# Patient Record
Sex: Female | Born: 1950
Health system: Southern US, Community
[De-identification: ages and names within clinical notes are randomized; demographics above are authoritative.]

## PROBLEM LIST (undated history)

## (undated) DIAGNOSIS — I10 Essential (primary) hypertension: Secondary | ICD-10-CM

## (undated) DIAGNOSIS — F329 Major depressive disorder, single episode, unspecified: Secondary | ICD-10-CM

## (undated) DIAGNOSIS — J3489 Other specified disorders of nose and nasal sinuses: Secondary | ICD-10-CM

## (undated) DIAGNOSIS — R233 Spontaneous ecchymoses: Secondary | ICD-10-CM

## (undated) DIAGNOSIS — Z87442 Personal history of urinary calculi: Secondary | ICD-10-CM

## (undated) DIAGNOSIS — Z460 Encounter for fitting and adjustment of spectacles and contact lenses: Secondary | ICD-10-CM

## (undated) DIAGNOSIS — F32A Depression, unspecified: Secondary | ICD-10-CM

## (undated) DIAGNOSIS — C50912 Malignant neoplasm of unspecified site of left female breast: Secondary | ICD-10-CM

## (undated) DIAGNOSIS — J45909 Unspecified asthma, uncomplicated: Secondary | ICD-10-CM

## (undated) DIAGNOSIS — E7800 Pure hypercholesterolemia, unspecified: Secondary | ICD-10-CM

## (undated) DIAGNOSIS — R011 Cardiac murmur, unspecified: Secondary | ICD-10-CM

## (undated) DIAGNOSIS — K449 Diaphragmatic hernia without obstruction or gangrene: Secondary | ICD-10-CM

## (undated) DIAGNOSIS — K625 Hemorrhage of anus and rectum: Secondary | ICD-10-CM

## (undated) DIAGNOSIS — F419 Anxiety disorder, unspecified: Secondary | ICD-10-CM

## (undated) DIAGNOSIS — M1990 Unspecified osteoarthritis, unspecified site: Secondary | ICD-10-CM

## (undated) DIAGNOSIS — I2510 Atherosclerotic heart disease of native coronary artery without angina pectoris: Secondary | ICD-10-CM

## (undated) DIAGNOSIS — R238 Other skin changes: Secondary | ICD-10-CM

## (undated) DIAGNOSIS — H9313 Tinnitus, bilateral: Secondary | ICD-10-CM

## (undated) DIAGNOSIS — M810 Age-related osteoporosis without current pathological fracture: Secondary | ICD-10-CM

## (undated) DIAGNOSIS — T7840XA Allergy, unspecified, initial encounter: Secondary | ICD-10-CM

## (undated) DIAGNOSIS — K219 Gastro-esophageal reflux disease without esophagitis: Secondary | ICD-10-CM

## (undated) HISTORY — PX: BREAST SURGERY: SHX581

## (undated) HISTORY — DX: Depression, unspecified: F32.A

## (undated) HISTORY — DX: Major depressive disorder, single episode, unspecified: F32.9

## (undated) HISTORY — PX: OVARIAN CYST SURGERY: SHX726

## (undated) HISTORY — DX: Spontaneous ecchymoses: R23.3

## (undated) HISTORY — DX: Anxiety disorder, unspecified: F41.9

## (undated) HISTORY — DX: Cardiac murmur, unspecified: R01.1

## (undated) HISTORY — DX: Allergy, unspecified, initial encounter: T78.40XA

## (undated) HISTORY — DX: Age-related osteoporosis without current pathological fracture: M81.0

## (undated) HISTORY — DX: Hemorrhage of anus and rectum: K62.5

## (undated) HISTORY — DX: Encounter for fitting and adjustment of spectacles and contact lenses: Z46.0

## (undated) HISTORY — PX: APPENDECTOMY: SHX54

## (undated) HISTORY — DX: Other skin changes: R23.8

## (undated) HISTORY — DX: Unspecified osteoarthritis, unspecified site: M19.90

## (undated) HISTORY — DX: Diaphragmatic hernia without obstruction or gangrene: K44.9

## (undated) HISTORY — DX: Other specified disorders of nose and nasal sinuses: J34.89

---

## 1998-05-27 ENCOUNTER — Other Ambulatory Visit: Admission: RE | Admit: 1998-05-27 | Discharge: 1998-05-27 | Payer: Self-pay | Admitting: *Deleted

## 1999-08-05 ENCOUNTER — Other Ambulatory Visit: Admission: RE | Admit: 1999-08-05 | Discharge: 1999-08-05 | Payer: Self-pay | Admitting: *Deleted

## 1999-09-04 ENCOUNTER — Other Ambulatory Visit: Admission: RE | Admit: 1999-09-04 | Discharge: 1999-09-04 | Payer: Self-pay | Admitting: *Deleted

## 1999-09-04 ENCOUNTER — Encounter (INDEPENDENT_AMBULATORY_CARE_PROVIDER_SITE_OTHER): Payer: Self-pay

## 2000-08-04 ENCOUNTER — Other Ambulatory Visit: Admission: RE | Admit: 2000-08-04 | Discharge: 2000-08-04 | Payer: Self-pay | Admitting: *Deleted

## 2001-08-28 ENCOUNTER — Other Ambulatory Visit: Admission: RE | Admit: 2001-08-28 | Discharge: 2001-08-28 | Payer: Self-pay | Admitting: *Deleted

## 2002-09-17 ENCOUNTER — Other Ambulatory Visit: Admission: RE | Admit: 2002-09-17 | Discharge: 2002-09-17 | Payer: Self-pay | Admitting: *Deleted

## 2003-09-30 ENCOUNTER — Other Ambulatory Visit: Admission: RE | Admit: 2003-09-30 | Discharge: 2003-09-30 | Payer: Self-pay | Admitting: *Deleted

## 2003-10-23 ENCOUNTER — Ambulatory Visit (HOSPITAL_COMMUNITY): Admission: RE | Admit: 2003-10-23 | Discharge: 2003-10-23 | Payer: Self-pay | Admitting: *Deleted

## 2004-10-21 ENCOUNTER — Other Ambulatory Visit: Admission: RE | Admit: 2004-10-21 | Discharge: 2004-10-21 | Payer: Self-pay | Admitting: *Deleted

## 2005-08-16 ENCOUNTER — Emergency Department (HOSPITAL_COMMUNITY): Admission: EM | Admit: 2005-08-16 | Discharge: 2005-08-16 | Payer: Self-pay | Admitting: Emergency Medicine

## 2005-09-28 ENCOUNTER — Other Ambulatory Visit: Admission: RE | Admit: 2005-09-28 | Discharge: 2005-09-28 | Payer: Self-pay | Admitting: *Deleted

## 2006-08-22 ENCOUNTER — Other Ambulatory Visit: Admission: RE | Admit: 2006-08-22 | Discharge: 2006-08-22 | Payer: Self-pay | Admitting: *Deleted

## 2007-08-21 ENCOUNTER — Other Ambulatory Visit: Admission: RE | Admit: 2007-08-21 | Discharge: 2007-08-21 | Payer: Self-pay | Admitting: *Deleted

## 2009-03-24 ENCOUNTER — Encounter: Admission: RE | Admit: 2009-03-24 | Discharge: 2009-03-24 | Payer: Self-pay | Admitting: Gastroenterology

## 2009-03-24 ENCOUNTER — Encounter (INDEPENDENT_AMBULATORY_CARE_PROVIDER_SITE_OTHER): Payer: Self-pay | Admitting: General Surgery

## 2009-03-24 ENCOUNTER — Ambulatory Visit (HOSPITAL_COMMUNITY): Admission: EM | Admit: 2009-03-24 | Discharge: 2009-03-25 | Payer: Self-pay | Admitting: Emergency Medicine

## 2010-09-08 ENCOUNTER — Other Ambulatory Visit: Payer: Self-pay | Admitting: Radiology

## 2010-09-08 DIAGNOSIS — D0590 Unspecified type of carcinoma in situ of unspecified breast: Secondary | ICD-10-CM | POA: Insufficient documentation

## 2010-09-08 DIAGNOSIS — C50919 Malignant neoplasm of unspecified site of unspecified female breast: Secondary | ICD-10-CM | POA: Insufficient documentation

## 2010-09-14 ENCOUNTER — Encounter
Admission: RE | Admit: 2010-09-14 | Discharge: 2010-09-14 | Payer: Self-pay | Source: Home / Self Care | Attending: Radiology | Admitting: Radiology

## 2010-10-01 ENCOUNTER — Encounter (HOSPITAL_BASED_OUTPATIENT_CLINIC_OR_DEPARTMENT_OTHER)
Admission: RE | Admit: 2010-10-01 | Discharge: 2010-10-01 | Disposition: A | Discharge status: Home / Self Care | Payer: PRIVATE HEALTH INSURANCE | Source: Ambulatory Visit | Attending: Surgery | Admitting: Surgery

## 2010-10-01 ENCOUNTER — Ambulatory Visit
Admission: RE | Admit: 2010-10-01 | Discharge: 2010-10-01 | Disposition: A | Discharge status: Home / Self Care | Payer: PRIVATE HEALTH INSURANCE | Source: Ambulatory Visit | Attending: Surgery | Admitting: Surgery

## 2010-10-01 ENCOUNTER — Other Ambulatory Visit: Payer: Self-pay | Admitting: Surgery

## 2010-10-01 DIAGNOSIS — Z01811 Encounter for preprocedural respiratory examination: Secondary | ICD-10-CM

## 2010-10-01 DIAGNOSIS — Z01812 Encounter for preprocedural laboratory examination: Secondary | ICD-10-CM | POA: Insufficient documentation

## 2010-10-01 DIAGNOSIS — Z01810 Encounter for preprocedural cardiovascular examination: Secondary | ICD-10-CM | POA: Insufficient documentation

## 2010-10-01 LAB — COMPREHENSIVE METABOLIC PANEL
AST: 22 U/L (ref 0–37)
BUN: 15 mg/dL (ref 6–23)
Calcium: 9.7 mg/dL (ref 8.4–10.5)
Chloride: 101 mEq/L (ref 96–112)
GFR calc Af Amer: >60 mL/min (ref >60)
GFR calc non Af Amer: 60 mL/min — ABNORMAL LOW (ref >60)
Glucose, Bld: 114 mg/dL — ABNORMAL HIGH (ref 70–99)
Potassium: 4 mEq/L (ref 3.5–5.1)
Total Protein: 7.3 g/dL (ref 6.0–8.3)

## 2010-10-01 LAB — DIFFERENTIAL
Basophils Relative: 0 % (ref 0–1)
Eosinophils Absolute: 0.1 10*3/uL (ref 0.0–0.7)
Eosinophils Relative: 2 % (ref 0–5)
Lymphocytes Relative: 26 % (ref 12–46)
Monocytes Absolute: 0.4 10*3/uL (ref 0.1–1.0)
Neutrophils Relative %: 65 % (ref 43–77)

## 2010-10-01 LAB — CBC
HCT: 40.3 % (ref 36.0–46.0)
Hemoglobin: 13.6 g/dL (ref 12.0–15.0)
MCH: 31.7 pg (ref 26.0–34.0)
MCHC: 33.7 g/dL (ref 30.0–36.0)
Platelets: 230 10*3/uL (ref 150–400)
RBC: 4.29 MIL/uL (ref 3.87–5.11)
RDW: 12.5 % (ref 11.5–15.5)

## 2010-10-01 LAB — URINE MICROSCOPIC-ADD ON

## 2010-10-01 LAB — URINALYSIS, ROUTINE W REFLEX MICROSCOPIC
Bilirubin Urine: NEGATIVE
Hgb urine dipstick: NEGATIVE

## 2010-10-05 ENCOUNTER — Ambulatory Visit (HOSPITAL_BASED_OUTPATIENT_CLINIC_OR_DEPARTMENT_OTHER): Admission: RE | Admit: 2010-10-05 | Payer: PRIVATE HEALTH INSURANCE | Source: Ambulatory Visit | Admitting: Surgery

## 2010-10-05 ENCOUNTER — Other Ambulatory Visit: Payer: Self-pay | Admitting: Surgery

## 2010-10-05 ENCOUNTER — Ambulatory Visit (HOSPITAL_BASED_OUTPATIENT_CLINIC_OR_DEPARTMENT_OTHER)
Admission: RE | Admit: 2010-10-05 | Discharge: 2010-10-05 | Disposition: A | Discharge status: Home / Self Care | Payer: PRIVATE HEALTH INSURANCE | Source: Ambulatory Visit | Attending: Surgery | Admitting: Surgery

## 2010-10-05 DIAGNOSIS — D0590 Unspecified type of carcinoma in situ of unspecified breast: Secondary | ICD-10-CM | POA: Insufficient documentation

## 2010-10-05 HISTORY — PX: BREAST LUMPECTOMY W/ NEEDLE LOCALIZATION: SHX1266

## 2010-10-06 NOTE — Op Note (Signed)
[name], [name]                ACCOUNT NO.:  [id number]  MEDICAL RECORD NO.:  [id number]           PATIENT TYPE:  O  LOCATION:  SDS                          FACILITY:  [facility]  PHYSICIAN:  Currie Paris, M.D.DATE OF BIRTH:  29-Jan-1951  DATE OF PROCEDURE:  10/05/2010 DATE OF DISCHARGE:                              OPERATIVE REPORT   PREOPERATIVE DIAGNOSIS:  Ductal carcinoma in situ, left breast upper outer quadrant, clinical stage 0.  POSTOPERATIVE DIAGNOSIS:  Ductal carcinoma in situ, left breast upper outer quadrant, clinical stage 0.  OPERATION:  Needle-guided excision, left breast cancer.  SURGEON:  Currie Paris, MD  ANESTHESIA:  General.  CLINICAL HISTORY:  This is a 60 year old lady who has recently been found to have some calcifications in the upper outer quadrant left breast and a biopsy had shown intermediate grade DCIS.  There is a second area of calcifications somewhat superior to that, that was biopsied and some bleeding occurred, so some additional biopsies around taken from this area, but the biopsy appeared benign.  After lengthy discussion with the patient and her husband, she elected to proceed to lumpectomy.  Two guidewires were placed, so we could attempt to get the incompletely biopsied area as well.  DESCRIPTION OF PROCEDURE:  I saw the patient in the holding area and reviewed the plans for the procedure as noted above.  She had no further questions.  We both marked the left side as the operative side.  I reviewed the localizing films.  The patient was taken to the operating room and after satisfactory general anesthesia had been obtained, the left breast was prepped and draped and the time-out was done.  The 2 guidewires entered laterally and tracked medially.  The one that tracked towards the DCIS was more inferior and tracked a little bit more towards the chest wall slightly superior and from lateral to medial.  The second wire was  almost in a direct horizontal position couple centimeters above the first.  I made a curvilinear incision between the 2 guidewires.  I mobilized both guidewires into the wound.  I divided some of the fatty and breast tissue lateral to the 2 guidewires down towards the chest wall.  I raised flaps superiorly and inferiorly, so that I had a very thin margin anteriorly.  I did find the clip from the nonmalignant area just basically right under the skin and that was loose, so I took that out and put a suture on the specimen right where the clip had been and marked that with a clip for specimen localizing purposes.  After making a fairly wide flaps on both superior and inferior and somewhat medial, I began to divide the breast tissue from the lateral aspect around the superior aspect towards the medial aspect, then came across the medial aspect and then inferiorly as well.  The more superior of the 2 guidewires then packed with the very bottom of the specimen. This appeared to be where there was some fascia where I had gotten down to the chest wall.  The wire and one of the DCIS appeared to be fairly  well central in the specimen.  All of the margins looked either as fatty tissue or just fibrotic breast tissue.  I spent several minutes making sure everything looked dry.  There had been one arterial bleeder that I had coagulated and had blood fairly vigorously and I think may ever presented the blood vessel that was caused the postoperative hematoma.  I also suture ligated this.  Once I was sure everything was dry, I irrigated.  I then put some Marcaine in to help with postop analgesia.  I put some clips in to mark the margins of the lumpectomy cavity.  We irrigated again and everything appeared to be dry.  The incision was then closed in layers with 3-0 Vicryl, 4-0 Monocryl subcuticular and Dermabond.  Some compression dressing was applied.  The patient tolerated the procedure well.   There were no operative complications.  All counts were correct.     Currie Paris, M.D.     CJS/MEDQ  D:  10/05/2010  T:  10/06/2010  Job:  [id number]  cc:   Ellen Cables, MD [name], M.D.  Electronically Signed by Cyndia Bent M.D. on 10/06/2010 08:36:37 AM

## 2010-10-26 ENCOUNTER — Encounter (HOSPITAL_BASED_OUTPATIENT_CLINIC_OR_DEPARTMENT_OTHER): Payer: PRIVATE HEALTH INSURANCE | Admitting: Oncology

## 2010-10-26 DIAGNOSIS — D0590 Unspecified type of carcinoma in situ of unspecified breast: Secondary | ICD-10-CM

## 2010-10-28 ENCOUNTER — Ambulatory Visit: Payer: PRIVATE HEALTH INSURANCE | Attending: Radiation Oncology | Admitting: Radiation Oncology

## 2010-10-28 DIAGNOSIS — D0590 Unspecified type of carcinoma in situ of unspecified breast: Secondary | ICD-10-CM | POA: Insufficient documentation

## 2010-10-28 DIAGNOSIS — K219 Gastro-esophageal reflux disease without esophagitis: Secondary | ICD-10-CM | POA: Insufficient documentation

## 2010-10-28 DIAGNOSIS — Z79899 Other long term (current) drug therapy: Secondary | ICD-10-CM | POA: Insufficient documentation

## 2010-10-28 DIAGNOSIS — Z801 Family history of malignant neoplasm of trachea, bronchus and lung: Secondary | ICD-10-CM | POA: Insufficient documentation

## 2010-10-28 DIAGNOSIS — Z170 Estrogen receptor positive status [ER+]: Secondary | ICD-10-CM | POA: Insufficient documentation

## 2010-10-28 DIAGNOSIS — F17200 Nicotine dependence, unspecified, uncomplicated: Secondary | ICD-10-CM | POA: Insufficient documentation

## 2010-10-28 DIAGNOSIS — Z803 Family history of malignant neoplasm of breast: Secondary | ICD-10-CM | POA: Insufficient documentation

## 2010-11-21 LAB — BASIC METABOLIC PANEL
Calcium: 8.8 mg/dL (ref 8.4–10.5)
Creatinine, Ser: 0.71 mg/dL (ref 0.4–1.2)
GFR calc Af Amer: >60 mL/min (ref >60)
Glucose, Bld: 109 mg/dL — ABNORMAL HIGH (ref 70–99)
Potassium: 4 mEq/L (ref 3.5–5.1)

## 2010-11-21 LAB — DIFFERENTIAL
Basophils Relative: 0 % (ref 0–1)
Lymphocytes Relative: 4 % — ABNORMAL LOW (ref 12–46)
Monocytes Absolute: 0.9 10*3/uL (ref 0.1–1.0)
Neutro Abs: 13.2 10*3/uL — ABNORMAL HIGH (ref 1.7–7.7)

## 2010-11-21 LAB — CBC
HCT: 40.5 % (ref 36.0–46.0)
Hemoglobin: 13.2 g/dL (ref 12.0–15.0)
MCHC: 32.6 g/dL (ref 30.0–36.0)
MCV: 97.7 fL (ref 78.0–100.0)
RBC: 4.15 MIL/uL (ref 3.87–5.11)

## 2010-11-21 LAB — TYPE AND SCREEN: ABO/RH(D): O POS

## 2010-11-21 LAB — ABO/RH: ABO/RH(D): O POS

## 2010-12-02 ENCOUNTER — Other Ambulatory Visit (HOSPITAL_COMMUNITY): Payer: Self-pay | Admitting: Surgery

## 2010-12-02 DIAGNOSIS — C50912 Malignant neoplasm of unspecified site of left female breast: Secondary | ICD-10-CM

## 2010-12-29 NOTE — Op Note (Signed)
[name], [name]                ACCOUNT NO.:  [id number]   MEDICAL RECORD NO.:  [id number]          PATIENT TYPE:  OBV   LOCATION:  0098                         FACILITY:  [facility]   PHYSICIAN:  Ollen Gross. Vernell Morgans, M.D. DATE OF BIRTH:  1950-10-18   DATE OF PROCEDURE:  03/24/2009  DATE OF DISCHARGE:                               OPERATIVE REPORT   PREOPERATIVE DIAGNOSIS:  Acute appendicitis.   POSTOPERATIVE DIAGNOSIS:  Acute appendicitis.   PROCEDURE:  Laparoscopic appendectomy.   SURGEON:  Ollen Gross. Vernell Morgans, M.D.   ANESTHESIA:  General endotracheal.   PROCEDURE:  After informed consent was obtained, the patient was brought  to the operating room and placed in a supine position on the operating  room table.  After adequate induction of general anesthesia, the  patient's abdomen was prepped with Betadine and draped in the usual  sterile manner.  The area below his umbilicus was infiltrated with 0.25%  Marcaine.  A small incision was made with a 15 blade knife.  This  incision was carried down through the subcutaneous tissue bluntly with a  hemostat and [employer] retractors until the linea alba was identified.  The linea alba was incised with a 15 blade knife, and each side was  grasped with Kocher clamps and elevated anteriorly.  The preperitoneal  space was then probed bluntly with a hemostat until the peritoneum was  opened.  Access was gained to the abdominal cavity.  A 0 Vicryl  pursestring stitch was placed in the fascia surrounding the opening.  The Hasson cannula  was placed through the opening and anchored in place  with previously placed Vicryl pursestring stitch.  The abdomen was then  insufflated with carbon dioxide without difficulty.  The patient was  placed in Trendelenburg position and rotated with the right side up.  A  laparoscope was inserted through the Hasson cannula , and the right  lower quadrant was inspected.  Next, the suprapubic area was infiltrated  with  4% Marcaine.  A small incision was made with a 15 blade knife, and  an 11-mm port was placed bluntly through this incision into the  abdominal cavity under direct vision.  A site was chosen between the 2  for placement of a 5-mm port.  This area was infiltrated 4% Marcaine.  A  small stab incision was made with a 15 blade knife, and a 5 mm port was  placed bluntly through this incision into the abdominal cavity under  direct vision.  The laparoscope was then moved to the suprapubic port  using a Glassman grasper and harmonic scalpel.  The right lower quadrant  again was examined.  An enlarged, inflamed appendix was identified.  It  had some inflammatory adhesions to the right gutter.  This was mostly  taken down by some blunt dissection with the [facility].  The mesoappendix  was identified.  It was taken down sharply with the harmonic scalpel.  Once this was accomplished, the base of the appendix at its junction  with the cecum was identified and cleared of the mesoappendix.  Laparoscopic GIA 45  blue load 6 row stapler was then placed through the  Hasson cannula  across the base of the appendix at its junction with the  cecum, clamped and fired, thereby dividing the base of the appendix  between staple lines.  The rest of the mesoappendix was taken down  sharply, beginning with the harmonic scalpel until the appendix was  completely free.  A laparoscopic bag was inserted through the [facility]  cannula.  The appendix was placed in the bag, and the bag was sealed.  The abdomen was then irrigated with copious amounts of saline until the  effluent was clear.  The staple line still had some oozing blood, and  this was controlled with clips.  Once this was accomplished, the  operative bed looked good.  The appendix was then removed in the bag  with the Hasson cannula through the infraumbilical port without  difficulty.  The fascial defect was closed with the previously placed  Vicryl pursestring  stitch as well as with another figure-of-eight 0  Vicryl stitch.  The rest of the ports were then removed under direct  vision and were found to be hemostatic.  The gas was allowed to escape.  The skin incisions were all closed with interrupted 4-0 Monocryl  subcuticular stitches.  Dermabond dressings were applied.  The patient  tolerated the procedure well.  At the end of the case, all needle,  sponge, and instrument counts were correct.  The patient was then awaked  and taken to recovery in stable condition.      Ollen Gross. Vernell Morgans, M.D.  Electronically Signed     PST/MEDQ  D:  03/24/2009  T:  03/25/2009  Job:  [id number]

## 2010-12-29 NOTE — H&P (Signed)
[name], [name]                ACCOUNT NO.:  [id number]   MEDICAL RECORD NO.:  [id number]          PATIENT TYPE:  EMS   LOCATION:  ED                           FAC[facility]  [facility]   PHYSICIAN:  Ollen Gross. Vernell Morgans, M.D. DATE OF BIRTH:  12/11/1950   DATE OF ADMISSION:  03/24/2009  DATE OF DISCHARGE:                              HISTORY & PHYSICAL   [name] is a 60 year old white female who presents to the emergency  department with worsening right lower quadrant pain over the last 4  days.  She has not run any fevers but had some chills on Thursday.  She  has had some nausea and vomiting associated with it today.  In  retrospect, she feels as though she has had this pain off and on since  March and has seen [name] for this.  Her other review of systems are  unremarkable.   PAST MEDICAL HISTORY:  Significant for:  1. Anxiety.  2. Asthma.   PAST SURGICAL HISTORY:  Significant for three laparoscopies.   MEDICATIONS:  Include calcium Celexa, Zyrtec, Singulair, multivitamin.   ALLERGIES:  ELAVIL AND SULFA.   SOCIAL HISTORY:  She is a Sport and exercise psychologist.  She denies any alcohol or tobacco  use.   FAMILY HISTORY:  Noncontributory.   PHYSICAL EXAM:  Her temperature is 98.7, pulse 104, blood pressure  117/65.  IN GENERAL:  She is a well-developed, well-nourished white female in no  acute distress.  SKIN:  Warm and dry with no jaundice.  EYES:  Extraocular muscles intact. Pupils equal, round, reactive to  light.  Sclerae nonicteric.  LUNGS:  Clear bilaterally with no use of accessory respiratory muscles.  HEART:  Has a regular rate and rhythm with the impulse in the left  chest.  ABDOMEN:  Soft with some moderate right lower quadrant tenderness but no  guarding or peritonitis.  EXTREMITIES:  No cyanosis, clubbing or edema with good strength in her  arms and legs.  PSYCHOLOGICALLY:  She is alert and oriented x3 with no evidence of  anxiety or depression.   LABORATORY]:  On review of her  lab work:  Significant for white count of  14.6.  CT scan showed an enlarged inflamed appendix but no abscess or  perforation.   ASSESSMENT/PLAN:  This is a 60 year old white female with what appears  to be acute appendicitis.  Because of the risk of perforation and  sepsis, I think she would benefit from having her appendix removed.  She  would also like  to have this done.  I have explained to her in detail the risks and  benefits of the operation to remove the appendix as well as some of the  technical aspects and she understands and wishes to proceed.  We will  plan to do this for her tonight.      Ollen Gross. Vernell Morgans, M.D.  Electronically Signed     PST/MEDQ  D:  03/24/2009  T:  03/24/2009  Job:  [id number]

## 2011-01-25 ENCOUNTER — Encounter (HOSPITAL_COMMUNITY)
Admission: RE | Admit: 2011-01-25 | Discharge: 2011-01-25 | Disposition: A | Discharge status: Home / Self Care | Payer: PRIVATE HEALTH INSURANCE | Source: Ambulatory Visit | Attending: Surgery | Admitting: Surgery

## 2011-01-25 LAB — DIFFERENTIAL
Basophils Relative: 0 % (ref 0–1)
Eosinophils Relative: 2 % (ref 0–5)
Monocytes Absolute: 0.4 10*3/uL (ref 0.1–1.0)
Neutro Abs: 3.6 10*3/uL (ref 1.7–7.7)

## 2011-01-25 LAB — URINALYSIS, ROUTINE W REFLEX MICROSCOPIC
Glucose, UA: NEGATIVE mg/dL
Hgb urine dipstick: NEGATIVE
Ketones, ur: NEGATIVE mg/dL
Urobilinogen, UA: 0.2 mg/dL (ref 0.0–1.0)
pH: 6 (ref 5.0–8.0)

## 2011-01-25 LAB — CBC
HCT: 41.3 % (ref 36.0–46.0)
MCH: 30.6 pg (ref 26.0–34.0)
MCHC: 33.2 g/dL (ref 30.0–36.0)
Platelets: 240 10*3/uL (ref 150–400)
RBC: 4.47 MIL/uL (ref 3.87–5.11)
WBC: 6.2 10*3/uL (ref 4.0–10.5)

## 2011-01-25 LAB — COMPREHENSIVE METABOLIC PANEL
AST: 26 U/L (ref 0–37)
BUN: 17 mg/dL (ref 6–23)
CO2: 29 mEq/L (ref 19–32)
Calcium: 9.6 mg/dL (ref 8.4–10.5)
Chloride: 102 mEq/L (ref 96–112)
Glucose, Bld: 93 mg/dL (ref 70–99)

## 2011-01-25 LAB — SURGICAL PCR SCREEN: MRSA, PCR: NEGATIVE

## 2011-01-26 ENCOUNTER — Other Ambulatory Visit: Payer: Self-pay | Admitting: Surgery

## 2011-01-26 ENCOUNTER — Inpatient Hospital Stay (HOSPITAL_COMMUNITY)
Admission: RE | Admit: 2011-01-26 | Discharge: 2011-01-26 | Disposition: A | Discharge status: Home / Self Care | Payer: PRIVATE HEALTH INSURANCE | Source: Ambulatory Visit | Attending: Surgery | Admitting: Surgery

## 2011-01-26 ENCOUNTER — Inpatient Hospital Stay (HOSPITAL_COMMUNITY)
Admission: RE | Admit: 2011-01-26 | Discharge: 2011-01-28 | DRG: 581 | Disposition: A | Discharge status: Home / Self Care | Payer: PRIVATE HEALTH INSURANCE | Source: Ambulatory Visit | Attending: Surgery | Admitting: Surgery

## 2011-01-26 DIAGNOSIS — C50912 Malignant neoplasm of unspecified site of left female breast: Secondary | ICD-10-CM

## 2011-01-26 DIAGNOSIS — C50419 Malignant neoplasm of upper-outer quadrant of unspecified female breast: Secondary | ICD-10-CM | POA: Diagnosis present

## 2011-01-26 DIAGNOSIS — D0590 Unspecified type of carcinoma in situ of unspecified breast: Principal | ICD-10-CM | POA: Diagnosis present

## 2011-01-26 DIAGNOSIS — Z01812 Encounter for preprocedural laboratory examination: Secondary | ICD-10-CM

## 2011-01-26 DIAGNOSIS — Z4001 Encounter for prophylactic removal of breast: Secondary | ICD-10-CM

## 2011-01-26 DIAGNOSIS — K219 Gastro-esophageal reflux disease without esophagitis: Secondary | ICD-10-CM | POA: Diagnosis present

## 2011-01-26 DIAGNOSIS — J45909 Unspecified asthma, uncomplicated: Secondary | ICD-10-CM | POA: Diagnosis present

## 2011-01-26 HISTORY — PX: MASTECTOMY COMPLETE / SIMPLE W/ SENTINEL NODE BIOPSY: SUR846

## 2011-01-26 HISTORY — PX: MASTECTOMY: SHX3

## 2011-01-26 MED ORDER — TECHNETIUM TC 99M SULFUR COLLOID FILTERED
1.0000 | Freq: Once | INTRAVENOUS | Status: AC | PRN
  Administered 2011-01-26: 1 via INTRADERMAL

## 2011-01-27 NOTE — Op Note (Signed)
[name], [name]                ACCOUNT NO.:  [id number]  MEDICAL RECORD NO.:  [id number]  LOCATION:  5121                         FACILITY:  [facility]  PHYSICIAN:  Currie Paris, M.D.DATE OF BIRTH:  1951-08-13  DATE OF PROCEDURE:  01/26/2011 DATE OF DISCHARGE:                              OPERATIVE REPORT   PREOPERATIVE DIAGNOSIS:  Ductal carcinoma in situ left breast upper outer quadrant, clinical stage 0, status post lumpectomy with close margin.  POSTOPERATIVE DIAGNOSIS:  Ductal carcinoma in situ left breast upper outer quadrant, clinical stage 0, status post lumpectomy with close margin.  PROCEDURES:  Right prophylactic total mastectomy, left total mastectomy with blue dye injection and axillary sentinel lymph node biopsy.  SURGEON:  Currie Paris, MD  ANESTHESIA:  General.  CLINICAL HISTORY:  This is a 60 year old lady who had a prior left breast biopsy, upper outer quadrant that had shown what apparently wasbenign disease but in the same area she had what looked like multifocal disease developed subsequently and vision had shown DCIS with close margins.  After lengthy discussion with the patient, she finally elected to have a mastectomy with sentinel node evaluation for her cancer on the left side and elected to have a prophylactic mastectomy on the right and the plans for reconstruction.  DESCRIPTION OF PROCEDURE:  I saw the patient in the holding area and she had no further questions.  We confirmed the plans as noted above.  I initialed the left axillary area as the side for the sentinel lymph node.  The patient was taken to the operating room and after satisfactory general endotracheal anesthesia had been obtained, the breasts were prepped and draped as a sterile field.  The time-out was done just prior to prep and draping and once that was completed, I injected 5 mL of dilute methylene blue subareolarly on the left, massaged that in.  I started on  the right side and made a relatively short elliptical incision, fairly narrow and then raised the usual skin flaps to clavicle, sternum, inframammary fold and latissimus.  The breast was removed from the underlying chest wall with cautery starting medially and working laterally.  I did not enter the axilla proper and there was no palpable adenopathy in the axilla.  Once I was sure everything was dry, I placed a moist pack with some antibiotic solution and turned my attention to the left side.  Using Neoprobe, I identified a hot area in the axilla and marked the overlying skin.  I made a somewhat longer wider incision to well encompass the prior curvilinear incision in the upper outer quadrant.  I raised similar skin flaps but after I raised a superior flap and gotten out laterally, I used a Neoprobe and opened the axillary fat pad and found 1 hot lymph node and 1 hot blue lymph node both adjacent somewhat posteriorly and inferiorly in the axilla.  These were sent for touch preps which subsequently returned negative.  While waiting for the touch preps to return, I finished the inferior flap and raised the flap laterally.  I removed the breast again from medial to lateral, opened the clavipectoral fascia and disconnected the breast from the  lateral chest wall.  Again, we did not do any further dissection in the axilla.  I made sure everything was dry and then put a moist pack and at this point Dr. Odis Luster came to do the reconstruction.     Currie Paris, M.D.     CJS/MEDQ  D:  01/26/2011  T:  01/27/2011  Job:  [id number]  Electronically Signed by Cyndia Bent M.D. on 01/27/2011 12:49:30 PM

## 2011-02-04 ENCOUNTER — Encounter (INDEPENDENT_AMBULATORY_CARE_PROVIDER_SITE_OTHER): Payer: Self-pay | Admitting: Surgery

## 2011-02-09 ENCOUNTER — Encounter (INDEPENDENT_AMBULATORY_CARE_PROVIDER_SITE_OTHER): Payer: Self-pay | Admitting: Surgery

## 2011-02-09 ENCOUNTER — Ambulatory Visit (INDEPENDENT_AMBULATORY_CARE_PROVIDER_SITE_OTHER): Payer: PRIVATE HEALTH INSURANCE | Admitting: Surgery

## 2011-02-09 VITALS — BP 126/72 | HR 78 | Temp 98.4°F | Resp 18 | Ht 63.0 in | Wt 135.0 lb

## 2011-02-09 DIAGNOSIS — D0590 Unspecified type of carcinoma in situ of unspecified breast: Secondary | ICD-10-CM

## 2011-02-09 NOTE — Progress Notes (Signed)
The patient returns for her postoperative visit after bilateral mastectomies which were done 2 weeks ago. Overall, the patient feels like she is doing well. Her drain to come out, removed by the plastic surgeon earlier. She is having minimal pain. She's taken only occasional pain medication. She has stopped taking her muscle relaxant.  She has no concerns about her wound and feels like they are healing nicely.  Exam: Both incisions are healing nicely. There is no evidence of infection or residual fluid. The implants appear to be appropriately placed.  Impression: Doing well following mastectomies.  Plan: She is doing close follow up with the plastic surgeon. I told her that I would see her in about 2 months. I gave her a copy of her pathology report and reviewed that with her.

## 2011-02-09 NOTE — Patient Instructions (Signed)
Come back for another follow-up in about two months. When Dr. Odis Luster gives the OK begin exercise program -  Visit the ABC class

## 2011-02-22 NOTE — Op Note (Signed)
[name], [name]                ACCOUNT NO.:  [id number]  MEDICAL RECORD NO.:  [id number]  LOCATION:  5121                         FACILITY:  [facility]  PHYSICIAN:  Etter Sjogren, M.D.     DATE OF BIRTH:  [birth year]  DATE OF PROCEDURE:  01/26/2011 DATE OF DISCHARGE:                              OPERATIVE REPORT   PREOPERATIVE DIAGNOSIS:  Breast cancer.  POSTOPERATIVE DIAGNOSIS:  Breast cancer.  PROCEDURES PERFORMED: 1. Right breast reconstruction with a tissue expander. 2. Left breast reconstruction with a tissue expander. 3. Distinct procedure, placement of acellular dermal matrix left side     for breast reconstruction.  SURGEON:  Etter Sjogren, MD  ANESTHESIA:  General.  ESTIMATED BLOOD LOSS:  20 mL.  DRAINS:  Two 19-French drains on each side.  CLINICAL NOTE:  A 60 year old woman has breast cancer and is having mastectomy because of extensive area of involvement and desires reconstruction.  Bilateral mastectomy planned and the reconstruction selected by her, was placed on a tissue expander.  She understood this is a planned stage procedure with eventual placement of an implant. [name] of this procedure, the risks plus complications, the overall convalescence discussed with her in great detail.  She understood those risks include not limited to bleeding, infection, anesthesia complications, healing problems, scarring, loss of sensation, fluid accumulations, failure of device, capsular contracture, wrinkles, ripples, displacement of device, disappointment, asymmetry, pulmonary embolism, pneumothorax and revision of surgeries and loss of skin, loss of tissue and she understood all of this and wished to proceed.  PROCEDURE IN DETAIL:  The patient was seen in the operating room and bilateral mastectomy completed.  The wounds were irrigated thoroughly with saline as well as antibiotic solution.  Dissection was carried deep to the pectoralis major muscle which was lifted and  the serratus anterior muscle also lifted and these were lifted to the preoperative markings along the inframammary crease.  Thorough irrigation with saline, antibiotic solution.  Excellent hemostasis was achieved using electrocautery.  On the right side, there was a total muscle coverage. On the left side, there was a small spot inferomedial that required the placement of a 3 x 4 cm piece of HD FLEX.  This was positioned with the dermal side up towards the mastectomy flap, epidermal side down and it was secured using a running 3-0 PDS suture.  The expanders were prepared.  These were Mentor medium height 450 mL expanders.  After thoroughly cleaning gloves, the antibiotic solution was placed over the expanders.  All the air were removed, 100 mL sterile saline placed using a closed filling system and again the space was checked.  Antibiotic solution placed and the expanders were positioned.  Muscle closure with 3-0 Vicryl interrupted figure-of-eight sutures and 19-French drains were positioned, brought through separate stab wounds inferolaterally and secured with 3-0 Prolene sutures. One was placed laterally side and one placed medial and superior.  Again excellent hemostasis confirmed.  Skin flaps looked very healthy.  No evidence of any vascular compromise. Bright red bleeding on periphery of the flaps consistent with viability. The skin closure with 3-0 Monocryl interrupted inverted deep dermal sutures.  The Dermabond glue was then placed and antibiotic  solution was left in the space around the tissue expander and also underneath the mastectomy flaps prior to completion.  Antibiotic ointment and dry sterile dressings with occlusive dressings placed around the drain sites.  Dry sterile dressings placed over the mastectomy incisions and the chest vest was positioned.  She was transferred to the recovery in stable having tolerated the procedure well.  DISPOSITION:  She will be observed  overnight in the hospital.     Etter Sjogren, M.D.     DB/MEDQ  D:  01/26/2011  T:  01/27/2011  Job:  [id number]  Electronically Signed by Etter Sjogren M.D. on 02/22/2011 08:38:47 AM

## 2011-02-22 NOTE — Discharge Summary (Signed)
  NAMEKIMBERLEIGH, [name]                ACCOUNT NO.:  [id number]  MEDICAL RECORD NO.:  [id number]  LOCATION:  5121                         FACILITY:  [facility]  PHYSICIAN:  Etter Sjogren, M.D.     DATE OF BIRTH:  02-13-1951  DATE OF ADMISSION:  01/26/2011 DATE OF DISCHARGE:  01/28/2011                              DISCHARGE SUMMARY   FINAL DIAGNOSIS:  Breast cancer.  PROCEDURES PERFORMED: 1. Bilateral mastectomy. 2. Left sentinel lymph node mapping 3. Bilateral breast reconstruction with tissue expanders. 4. Left breast reconstruction utilizing Acellular dermal matrix.  SUMMARY OF HISTORY OF PRESENT ILLNESS:  A 60 year old woman who has extensive areas of ductal carcinoma in situ on the left side and had some large area and lumpectomy was discussed that she ultimately elected to have mastectomy and she decided to have bilateral mastectomy.  She desired reconstruction.  Options discussed.  She preferred to use tissue expanders and she understood this was a stage procedure would eventually be followed by placement of implants and the nature of these procedures and risks plus complications were discussed with her in great detail and she understood all those and wished to proceed.  She also understood about the possibility that Acellular dermal matrix would be needed in her reconstruction.  For further details of history and physical, please see the chart.  COURSE IN THE HOSPITAL:  On admission, she was taken to surgery at which time bilateral mastectomy, left sentinel lymph node mapping, bilateral reconstruction with tissue expanders and left side placement of Acellular dermal matrix were performed.  She tolerated these procedures well.  Postoperatively, she did very well.  She had a very low-grade fever in the 99-100 degrees range that was responding to incentive spirometry and ambulation.  Chest soft.  No hematoma and no evidence of any fluid accumulations, drains, thin drainage, and  functioning.  The mastectomy flaps looked very good, but the 48-hour mark did develop a small area in the mid superior flap on the left side where she had a little bit of discoloration over an area of 3 mm x 1 cm.  This will be observed.  She is tolerating the pain well and having good pain relief with Dilaudid and Robaxin.  It is felt that she is ready to be discharged.  DISPOSITION:  She will empty her drains 3 times with every record of the amounts using spirometer 10 times a day at home.  MEDICATIONS: 1. Keflex 500 mg p.o. q.i.d. for 3 days and then one p.o. b.i.d. 2. Dilaudid 2 mg 1-2 p.o. q. 4 h. p.r.n. for pain. 3. Robaxin 500 mg 1 p.o. q.8 p.r.n. muscle spasm.  She will follow up with me in the office next week.  FINAL DIAGNOSIS:  Breast cancer.  Her condition at the time of discharge is good.     Etter Sjogren, M.D.     DB/MEDQ  D:  01/28/2011  T:  01/29/2011  Job:  [id number]  Electronically Signed by Etter Sjogren M.D. on 02/22/2011 08:38:40 AM

## 2011-04-12 ENCOUNTER — Encounter (INDEPENDENT_AMBULATORY_CARE_PROVIDER_SITE_OTHER): Payer: Self-pay | Admitting: Surgery

## 2011-04-13 ENCOUNTER — Encounter (INDEPENDENT_AMBULATORY_CARE_PROVIDER_SITE_OTHER): Payer: Self-pay | Admitting: Surgery

## 2011-04-13 ENCOUNTER — Ambulatory Visit (INDEPENDENT_AMBULATORY_CARE_PROVIDER_SITE_OTHER): Payer: PRIVATE HEALTH INSURANCE | Admitting: Surgery

## 2011-04-13 VITALS — BP 124/78 | HR 76

## 2011-04-13 DIAGNOSIS — D0590 Unspecified type of carcinoma in situ of unspecified breast: Secondary | ICD-10-CM

## 2011-04-13 NOTE — Progress Notes (Signed)
CC: Postop visit  HPI: This patient comes in for post op follow-up. She underwent bilateral mastectomy and left sentinel node on T. 12, 2011. She feels that she is doing well. Most of her current issues center around feeling tight at the site of her implants. She also notes a little bit of limited range of motion of the left shoulder remains. Chest appointment tomorrow with her plastic surgeon.  PE: General: The patient appears to be healthy, NAD  Breasts are surgically absent bilaterally. There is no evidence of wound infection. She is a very good range of motion on the right shoulder. It is a little bit limited on the left side. Her incisions are little bit more transverse and I had anticipated I suspect some excess skin had to be removed after the original incision was made.  IMPRESSION: The patient is doing well S/P bilateral mastectomy for left-sided DCIS.  DATA REVIWED: No new data  PLAN: I will see back in six months. However some self exam issues with her associated watch for problems that might develop. She will have some more work by the Engineer, petroleum over the next few months and hopefully when I see her back in six months that will all be done and we will have a good post reconstruction exam done.

## 2011-04-13 NOTE — Patient Instructions (Signed)
Come to see me in about six months. Check for any nodules or small lumps that might develop in the area of the mastectomies. If you feel anything unusual come see me.

## 2011-06-09 ENCOUNTER — Ambulatory Visit: Payer: PRIVATE HEALTH INSURANCE | Attending: Plastic Surgery | Admitting: Physical Therapy

## 2011-06-09 DIAGNOSIS — M25619 Stiffness of unspecified shoulder, not elsewhere classified: Secondary | ICD-10-CM | POA: Insufficient documentation

## 2011-06-09 DIAGNOSIS — IMO0001 Reserved for inherently not codable concepts without codable children: Secondary | ICD-10-CM | POA: Insufficient documentation

## 2011-06-09 DIAGNOSIS — M25519 Pain in unspecified shoulder: Secondary | ICD-10-CM | POA: Insufficient documentation

## 2011-06-09 DIAGNOSIS — M24519 Contracture, unspecified shoulder: Secondary | ICD-10-CM | POA: Insufficient documentation

## 2011-06-09 DIAGNOSIS — R0789 Other chest pain: Secondary | ICD-10-CM | POA: Insufficient documentation

## 2011-06-09 DIAGNOSIS — R609 Edema, unspecified: Secondary | ICD-10-CM | POA: Insufficient documentation

## 2011-06-14 ENCOUNTER — Ambulatory Visit: Payer: PRIVATE HEALTH INSURANCE | Admitting: Physical Therapy

## 2011-06-21 ENCOUNTER — Ambulatory Visit: Payer: PRIVATE HEALTH INSURANCE | Attending: Plastic Surgery | Admitting: Physical Therapy

## 2011-06-21 DIAGNOSIS — M24519 Contracture, unspecified shoulder: Secondary | ICD-10-CM | POA: Insufficient documentation

## 2011-06-21 DIAGNOSIS — M25519 Pain in unspecified shoulder: Secondary | ICD-10-CM | POA: Insufficient documentation

## 2011-06-21 DIAGNOSIS — IMO0001 Reserved for inherently not codable concepts without codable children: Secondary | ICD-10-CM | POA: Insufficient documentation

## 2011-06-21 DIAGNOSIS — M25619 Stiffness of unspecified shoulder, not elsewhere classified: Secondary | ICD-10-CM | POA: Insufficient documentation

## 2011-06-21 DIAGNOSIS — R609 Edema, unspecified: Secondary | ICD-10-CM | POA: Insufficient documentation

## 2011-06-21 DIAGNOSIS — R0789 Other chest pain: Secondary | ICD-10-CM | POA: Insufficient documentation

## 2011-06-24 ENCOUNTER — Ambulatory Visit: Payer: PRIVATE HEALTH INSURANCE | Admitting: Physical Therapy

## 2011-06-28 ENCOUNTER — Ambulatory Visit: Payer: PRIVATE HEALTH INSURANCE | Admitting: Physical Therapy

## 2011-07-05 ENCOUNTER — Encounter: Payer: PRIVATE HEALTH INSURANCE | Admitting: Physical Therapy

## 2011-07-07 ENCOUNTER — Encounter: Payer: PRIVATE HEALTH INSURANCE | Admitting: Physical Therapy

## 2011-09-09 ENCOUNTER — Encounter (INDEPENDENT_AMBULATORY_CARE_PROVIDER_SITE_OTHER): Payer: Self-pay | Admitting: Surgery

## 2011-11-15 ENCOUNTER — Other Ambulatory Visit: Payer: Self-pay | Admitting: Physician Assistant

## 2012-01-27 ENCOUNTER — Ambulatory Visit: Payer: PRIVATE HEALTH INSURANCE | Attending: Plastic Surgery | Admitting: Physical Therapy

## 2012-01-27 DIAGNOSIS — M24519 Contracture, unspecified shoulder: Secondary | ICD-10-CM | POA: Insufficient documentation

## 2012-01-27 DIAGNOSIS — IMO0001 Reserved for inherently not codable concepts without codable children: Secondary | ICD-10-CM | POA: Insufficient documentation

## 2012-02-02 ENCOUNTER — Ambulatory Visit: Payer: PRIVATE HEALTH INSURANCE | Admitting: Physical Therapy

## 2012-02-03 ENCOUNTER — Encounter: Payer: PRIVATE HEALTH INSURANCE | Admitting: Physical Therapy

## 2012-02-09 ENCOUNTER — Ambulatory Visit: Payer: PRIVATE HEALTH INSURANCE

## 2012-02-10 ENCOUNTER — Ambulatory Visit: Payer: PRIVATE HEALTH INSURANCE | Admitting: Physical Therapy

## 2012-02-14 ENCOUNTER — Ambulatory Visit: Payer: PRIVATE HEALTH INSURANCE | Attending: Family Medicine

## 2012-02-14 DIAGNOSIS — M25619 Stiffness of unspecified shoulder, not elsewhere classified: Secondary | ICD-10-CM | POA: Insufficient documentation

## 2012-02-14 DIAGNOSIS — M24519 Contracture, unspecified shoulder: Secondary | ICD-10-CM | POA: Insufficient documentation

## 2012-02-14 DIAGNOSIS — IMO0001 Reserved for inherently not codable concepts without codable children: Secondary | ICD-10-CM | POA: Insufficient documentation

## 2012-02-16 ENCOUNTER — Ambulatory Visit: Payer: PRIVATE HEALTH INSURANCE

## 2012-02-21 ENCOUNTER — Ambulatory Visit (INDEPENDENT_AMBULATORY_CARE_PROVIDER_SITE_OTHER): Payer: PRIVATE HEALTH INSURANCE | Admitting: Family Medicine

## 2012-02-21 ENCOUNTER — Ambulatory Visit: Payer: PRIVATE HEALTH INSURANCE | Admitting: Physical Therapy

## 2012-02-21 VITALS — BP 140/74 | HR 85 | Temp 98.5°F | Resp 18 | Ht 62.5 in | Wt 144.6 lb

## 2012-02-21 DIAGNOSIS — F341 Dysthymic disorder: Secondary | ICD-10-CM

## 2012-02-21 DIAGNOSIS — G4700 Insomnia, unspecified: Secondary | ICD-10-CM

## 2012-02-21 DIAGNOSIS — G4710 Hypersomnia, unspecified: Secondary | ICD-10-CM

## 2012-02-21 DIAGNOSIS — F329 Major depressive disorder, single episode, unspecified: Secondary | ICD-10-CM

## 2012-02-21 MED ORDER — DULOXETINE HCL 30 MG PO CPEP: ORAL_CAPSULE | ORAL | Status: DC

## 2012-02-21 MED ORDER — DIAZEPAM 2 MG PO TABS: 2.0000 mg | ORAL_TABLET | Freq: Every evening | ORAL | Status: DC | PRN

## 2012-02-21 NOTE — Patient Instructions (Addendum)
Here are a few names for counseling:    Cephus Slater [phone]  Karmen Bongo [phone]  Laurell Roof [phone]  Start cymbalta 30mg  - 1 per day for 1 week, then 2 each day.  Can switch to 1 per day of samples when done with 30mg  pills, but follow up in next 4 weeks.  Valium - can take 1/2 up to 2 at bedtime as needed for sleep.    Return to the clinic or go to the nearest emergency room if any of your symptoms worsen or new symptoms occur.

## 2012-02-21 NOTE — Progress Notes (Signed)
  Subjective:    Patient ID: [name], female    DOB: 21-Aug-1950, 61 y.o.   MRN: [id number]  HPI [name] is a 61 y.o. female Hx of anxiety,has been on Celexa for awhile.  Prior on 40mg  - was started on 20mg  in January, but felt like stress is higher.  Medical bills and debt right now. recently dealing with more stress., sister has had good relief with cymbalta - wondering if should change to that. Wasn't noticing as much relief of celexa at 40mg  - has been stress past few years. Separated from husband in September.  He came back in December for her reconstruction, but then left again in February.  Stress with trying to get back together, then not.  Has talked to minister, but no recent counseling. Prior counseling last year. Trouble staying asleep.  Waking up with tossing and turning.  Not rested.  Absent minded. Has tried xanax, lorazepam - never getting peaceful rest.  Sometimes residual daytime symptoms, but usually didn't work well enough.    Review of Systems Per hpi.  No SI.     Objective:   Physical Exam  Constitutional: She is oriented to person, place, and time. She appears well-developed and well-nourished.  HENT:  Head: Normocephalic and atraumatic.  Neck: No thyromegaly present.  Pulmonary/Chest: Effort normal.  Neurological: She is alert and oriented to person, place, and time.  Skin: Skin is warm and dry.  Psychiatric: Her speech is normal and behavior is normal. Judgment and thought content normal. Cognition and memory are normal. She expresses no suicidal ideation.       Flat affect, but good eye contact.  No PMA/PMR.           Assessment & Plan:  [name] is a 61 y.o. female Depression/anxiety with marital life stressors. Would like to try different SSRI - will change to Cymbalta - 30mg  qd x 1 week, then 2 po qd. #60, no refill, and samples #14 of 60mg  if tolerating this dose. Recheck in next 2-4 weeks.   Insomnia - trial of Valium 2mg  - 1/2 to 2  qhs prn  - to find right dose.  Recheck in next 2-4 weeks.

## 2012-02-23 ENCOUNTER — Ambulatory Visit: Payer: PRIVATE HEALTH INSURANCE

## 2012-02-24 ENCOUNTER — Ambulatory Visit: Payer: PRIVATE HEALTH INSURANCE | Admitting: Physical Therapy

## 2012-02-25 ENCOUNTER — Encounter: Payer: PRIVATE HEALTH INSURANCE | Admitting: Physical Therapy

## 2012-03-21 ENCOUNTER — Other Ambulatory Visit: Payer: Self-pay | Admitting: Family Medicine

## 2012-03-21 NOTE — Telephone Encounter (Signed)
Pt wanted to relay to dr Neva Seat that she is doing well on the cymbalta. She wants to know if she can get a refill as there were not any on the original script. Pt can be reached on cell At [phone] or office number during the day @ [phone] ext. [phone]

## 2012-07-14 ENCOUNTER — Other Ambulatory Visit: Payer: Self-pay | Admitting: Physician Assistant

## 2012-07-28 ENCOUNTER — Other Ambulatory Visit: Payer: Self-pay | Admitting: Physician Assistant

## 2012-07-28 NOTE — Telephone Encounter (Signed)
Please pull paper chart.  

## 2012-07-31 ENCOUNTER — Telehealth: Payer: Self-pay

## 2012-07-31 NOTE — Telephone Encounter (Signed)
Chart pulled to PA pool [id number]

## 2012-07-31 NOTE — Telephone Encounter (Signed)
PATIENT CALLED SAYING THAT PHARMACY HAS NOT RECEIVED HER REFILL FOR HER ANTI DEPRESSANT. SHE'S VERY CONCERNED ABOUT SKIPPING A DAY. NOTIFIED THAT IT LOOKS LIKE IT WAS FILLED Friday ON THE 13TH. PLEASE CAN SOMEONE DOUBLE CHECK OR VERIFY PLEASE. THANK YOU!

## 2012-08-01 NOTE — Telephone Encounter (Signed)
Called patient advised Rx sent to [facility] [address]

## 2012-08-30 ENCOUNTER — Ambulatory Visit (INDEPENDENT_AMBULATORY_CARE_PROVIDER_SITE_OTHER): Payer: PRIVATE HEALTH INSURANCE | Admitting: Family Medicine

## 2012-08-30 VITALS — BP 122/74 | HR 97 | Temp 98.5°F | Resp 16 | Ht 62.5 in | Wt 155.0 lb

## 2012-08-30 DIAGNOSIS — G4700 Insomnia, unspecified: Secondary | ICD-10-CM

## 2012-08-30 DIAGNOSIS — F341 Dysthymic disorder: Secondary | ICD-10-CM

## 2012-08-30 DIAGNOSIS — D229 Melanocytic nevi, unspecified: Secondary | ICD-10-CM

## 2012-08-30 DIAGNOSIS — D239 Other benign neoplasm of skin, unspecified: Secondary | ICD-10-CM

## 2012-08-30 DIAGNOSIS — F418 Other specified anxiety disorders: Secondary | ICD-10-CM

## 2012-08-30 MED ORDER — DULOXETINE HCL 60 MG PO CPEP: 60.0000 mg | ORAL_CAPSULE | Freq: Every day | ORAL | Status: DC

## 2012-08-30 MED ORDER — DIAZEPAM 2 MG PO TABS: 2.0000 mg | ORAL_TABLET | Freq: Every evening | ORAL | Status: AC | PRN

## 2012-08-30 NOTE — Progress Notes (Signed)
Subjective:    Patient ID: Jovita Gamma, female    DOB: 01-Jun-1951, 62 y.o.   MRN: [id number]  HPI PLUMA DINIZ is a 62 y.o. female  Here for few concerns today.  Depression with anxiety - last OV 02/21/12. Depression/anxiety with marital life stressors. Wanted to try different SSRI at that time. changed to Cymbalta - 30mg  qd x 1 week, then 2 po qd. #60, no refill, and samples #14 of 60mg  if tolerating this dose.  Plan to recheck in next 2-4 weeks. Also had insomnia - trial of Valium 2mg  - 1/2 to 2 qhs prn  - to find right dose.  See phone notes.    Now on 60mg  qd.  No new side effects.  Mood feels good overall.  [place] still [place], but he may come back.  No recent counseling, but doesn't feel like needs this at this point.  Some difficulty with sleep, but very active schedule with work and other activities. Toss and turn at night. Denies anxiety or perseverating.  Occasional valium in the past - helped sleep - 1/2 to 1 pill. No daytime side effects or parasomnias.  Ran out - too busy to come back. Tired during the day if not resting well.  Home repair issues. No suicide thoughts, no regular alcohol use. No regular exercise, but plans on starting this. Has gliding walker at home as well.   Also complains of sores - small red bump on inside of R leg few years ago- froze area. Returned and frozen off 3 times. Had resolved for 15 years, now back for past 6 months.  Crusted.  Other bump on back of r leg for past 6 months, 3rd mole on lower back - crusted, scratches at times. Father did have skin cancer in his later years.    Review of Systems     Objective:   Physical Exam  Vitals reviewed. Constitutional: She appears well-developed and well-nourished.  HENT:  Head: Normocephalic and atraumatic.  Cardiovascular: Normal rate, regular rhythm, normal heart sounds and intact distal pulses.   Pulmonary/Chest: Effort normal and breath sounds normal.  Skin:        R leg - 3mm anteromedail, 4mm posterior nevus - light tan to flesh colored with slight crusting.  Lower back - 3mm crusted, excoriated flat lesion with slight hyperpigmentation peripherally.   Psychiatric: She has a normal mood and affect. Her behavior is normal. Judgment and thought content normal.       Assessment & Plan:  LENDY DITTRICH is a 62 y.o. female 1. Depression with anxiety  DULoxetine (CYMBALTA) 60 MG capsule, diazepam (VALIUM) 2 MG tablet  2. Nevus  Ambulatory referral to Dermatology  3. Insomnia  diazepam (VALIUM) 2 MG tablet   Anxiety/depression - stable.  Intermittent insomnia.  Can try valium - 1/2 QHS prn, but also plan on increase in exercise as this should help with stress mgt and sleep.  Recheck in 6 months - sooner if worse.   Nevi - 2 on r leg, 1 lower back.  No concerning findings on size, but recurrence on leg, and FH of skin cancer (possibly melanoma)  - will refer to derm for eval/possible bx.   Patient Instructions  We will refer you to dermatology. Let us know if you have not received a call in the next 2 weeks. Follow up in 6 months for a physical. Work on exercise as discussed. Valium at bedtime of needed. If further  refills needed - call to discuss dosing.  Your should receive a call or letter about your lab results within the next week to 10 days.

## 2012-08-30 NOTE — Patient Instructions (Signed)
We will refer you to dermatology. Let us know if you have not received a call in the next 2 weeks. Follow up in 6 months for a physical. Work on exercise as discussed. Valium at bedtime of needed. If further refills needed - call to discuss dosing.  Your should receive a call or letter about your lab results within the next week to 10 days.

## 2012-11-05 ENCOUNTER — Other Ambulatory Visit: Payer: Self-pay | Admitting: Physician Assistant

## 2012-12-09 ENCOUNTER — Encounter (HOSPITAL_COMMUNITY): Payer: Self-pay | Admitting: Nurse Practitioner

## 2012-12-09 ENCOUNTER — Emergency Department (HOSPITAL_COMMUNITY): Payer: PRIVATE HEALTH INSURANCE

## 2012-12-09 ENCOUNTER — Emergency Department (HOSPITAL_COMMUNITY)
Admission: EM | Admit: 2012-12-09 | Discharge: 2012-12-09 | Disposition: A | Discharge status: Home / Self Care | Payer: PRIVATE HEALTH INSURANCE | Attending: Emergency Medicine | Admitting: Emergency Medicine

## 2012-12-09 DIAGNOSIS — R112 Nausea with vomiting, unspecified: Secondary | ICD-10-CM | POA: Insufficient documentation

## 2012-12-09 DIAGNOSIS — Z79899 Other long term (current) drug therapy: Secondary | ICD-10-CM | POA: Insufficient documentation

## 2012-12-09 DIAGNOSIS — Z8709 Personal history of other diseases of the respiratory system: Secondary | ICD-10-CM | POA: Insufficient documentation

## 2012-12-09 DIAGNOSIS — F411 Generalized anxiety disorder: Secondary | ICD-10-CM | POA: Insufficient documentation

## 2012-12-09 DIAGNOSIS — Z859 Personal history of malignant neoplasm, unspecified: Secondary | ICD-10-CM | POA: Insufficient documentation

## 2012-12-09 DIAGNOSIS — N200 Calculus of kidney: Secondary | ICD-10-CM | POA: Insufficient documentation

## 2012-12-09 DIAGNOSIS — Z460 Encounter for fitting and adjustment of spectacles and contact lenses: Secondary | ICD-10-CM | POA: Insufficient documentation

## 2012-12-09 DIAGNOSIS — Z8719 Personal history of other diseases of the digestive system: Secondary | ICD-10-CM | POA: Insufficient documentation

## 2012-12-09 DIAGNOSIS — J45909 Unspecified asthma, uncomplicated: Secondary | ICD-10-CM | POA: Insufficient documentation

## 2012-12-09 DIAGNOSIS — Z8739 Personal history of other diseases of the musculoskeletal system and connective tissue: Secondary | ICD-10-CM | POA: Insufficient documentation

## 2012-12-09 LAB — CBC WITH DIFFERENTIAL/PLATELET
Basophils Absolute: 0 10*3/uL (ref 0.0–0.1)
Eosinophils Relative: 2 % (ref 0–5)
HCT: 40.1 % (ref 36.0–46.0)
Lymphocytes Relative: 26 % (ref 12–46)
MCHC: 33.4 g/dL (ref 30.0–36.0)
MCV: 91.8 fL (ref 78.0–100.0)
Monocytes Absolute: 0.4 10*3/uL (ref 0.1–1.0)
RDW: 13.2 % (ref 11.5–15.5)
WBC: 6.9 10*3/uL (ref 4.0–10.5)

## 2012-12-09 LAB — URINALYSIS, ROUTINE W REFLEX MICROSCOPIC
Glucose, UA: NEGATIVE mg/dL
Ketones, ur: NEGATIVE mg/dL
Protein, ur: NEGATIVE mg/dL

## 2012-12-09 LAB — POCT I-STAT, CHEM 8
BUN: 17 mg/dL (ref 6–23)
Calcium, Ion: 1.14 mmol/L (ref 1.13–1.30)
Chloride: 107 mEq/L (ref 96–112)
Potassium: 5 mEq/L (ref 3.5–5.1)

## 2012-12-09 LAB — URINE MICROSCOPIC-ADD ON

## 2012-12-09 MED ORDER — TAMSULOSIN HCL 0.4 MG PO CAPS
0.4000 mg | ORAL_CAPSULE | Freq: Every day | ORAL | Status: DC
  Administered 2012-12-09: 0.4 mg via ORAL
  Filled 2012-12-09: qty 1

## 2012-12-09 MED ORDER — ONDANSETRON 8 MG PO TBDP: 8.0000 mg | ORAL_TABLET | Freq: Three times a day (TID) | ORAL | Status: DC | PRN

## 2012-12-09 MED ORDER — ONDANSETRON HCL 4 MG/2ML IJ SOLN
4.0000 mg | Freq: Once | INTRAMUSCULAR | Status: AC
  Administered 2012-12-09: 4 mg via INTRAVENOUS
  Filled 2012-12-09: qty 2

## 2012-12-09 MED ORDER — TAMSULOSIN HCL 0.4 MG PO CAPS: 0.4000 mg | ORAL_CAPSULE | Freq: Every day | ORAL | Status: DC

## 2012-12-09 MED ORDER — FENTANYL CITRATE 0.05 MG/ML IJ SOLN
50.0000 ug | Freq: Once | INTRAMUSCULAR | Status: AC
  Administered 2012-12-09: 50 ug via INTRAVENOUS
  Filled 2012-12-09: qty 2

## 2012-12-09 MED ORDER — OXYCODONE-ACETAMINOPHEN 5-325 MG PO TABS: 1.0000 | ORAL_TABLET | ORAL | Status: DC | PRN

## 2012-12-09 MED ORDER — KETOROLAC TROMETHAMINE 30 MG/ML IJ SOLN
30.0000 mg | Freq: Once | INTRAMUSCULAR | Status: AC
  Administered 2012-12-09: 30 mg via INTRAVENOUS
  Filled 2012-12-09: qty 1

## 2012-12-09 NOTE — ED Provider Notes (Signed)
Medical screening examination/treatment/procedure(s) were performed by non-physician practitioner and as supervising physician I was immediately available for consultation/collaboration.  Geoffery Lyons, MD 12/09/12 [phone]

## 2012-12-09 NOTE — ED Notes (Signed)
Per pt: States that a PIV is ok to use in right arm.

## 2012-12-09 NOTE — ED Notes (Signed)
Patient transported to CT 

## 2012-12-09 NOTE — ED Provider Notes (Signed)
History     CSN: [id number]  Arrival date & time 12/09/12  0827   First MD Initiated Contact with Patient 12/09/12 0830      No chief complaint on file.   (Consider location/radiation/quality/duration/timing/severity/associated sxs/prior treatment) HPI [name] is a 62 y.o. female who presents to ED with complaint of a flank pain. States pain started in the middle of  The night. Started in right flank, now radiating down right abdomen. States has had dull pain in the abdomen for several weeks. States hx of ovarian cysts. States called her GYN, has apt for follow up. States last night though pain worsened to a 10/10. States was diaphoretic, nausea, vomiting. States no blood in urine, no dysuria. No fever. Denies changes in bowels, no diarrhea. Did not take any medications for pain prior to coming in. No vagina complaints.     Past Medical History  Diagnosis Date  . Asthma   . Rectal bleeding     hemorroid  . Hiatal hernia   . Kidney stone   . Sinus drainage   . Contact lens/glasses fitting   . Bruises easily   . Arthritis   . Anxiety   . Cancer   . Allergy     Past Surgical History  Procedure Laterality Date  . Breast lumpectomy w/ needle localization  10/05/2010    lumpectomy  . Ovarian cyst surgery  1997, 2001, 2008  . Mastectomy w/ sentinel node biopsy  01/26/2011  . Simple mastectomy  01/26/2011  . Appendectomy      Family History  Problem Relation Age of Onset  . Hypertension Mother   . Heart Problems Mother   . Hypertension Father   . Heart failure Paternal Grandmother   . Heart failure Paternal Grandfather   . Cancer Brother     History  Substance Use Topics  . Smoking status: Never Smoker   . Smokeless tobacco: Not on file  . Alcohol Use: No    OB History   Grav Para Term Preterm Abortions TAB SAB Ect Mult Living                  Review of Systems  Constitutional: Negative for fever and chills.  Respiratory: Negative.   Cardiovascular:  Negative.   Gastrointestinal: Positive for nausea, vomiting and abdominal pain. Negative for diarrhea, constipation and blood in stool.  Genitourinary: Positive for flank pain. Negative for dysuria, hematuria, vaginal bleeding, vaginal discharge, difficulty urinating, vaginal pain and pelvic pain.  Skin: Negative.   Neurological: Negative for dizziness and weakness.    Allergies  Elavil and Sulfur  Home Medications   Current Outpatient Rx  Name  Route  Sig  Dispense  Refill  . albuterol (PROVENTIL HFA;VENTOLIN HFA) 108 (90 BASE) MCG/ACT inhaler   Inhalation   Inhale 2 puffs into the lungs every 6 (six) hours as needed for wheezing or shortness of breath.          . calcium-vitamin D (OSCAL WITH D) 500-200 MG-UNIT per tablet   Oral   Take 1 tablet by mouth daily.         . cetirizine (ZYRTEC) 10 MG tablet   Oral   Take 10 mg by mouth daily as needed for allergies.          . cholecalciferol (VITAMIN D) 1000 UNITS tablet   Oral   Take 1,000 Units by mouth daily.         . diazepam (VALIUM) 5 MG tablet  Oral   Take 5 mg by mouth 2 (two) times daily as needed for anxiety.         . DULoxetine (CYMBALTA) 60 MG capsule   Oral   Take 1 capsule (60 mg total) by mouth daily. Needs office visit   90 capsule   1   . magnesium oxide (MAG-OX) 400 MG tablet   Oral   Take 400 mg by mouth daily.         . montelukast (SINGULAIR) 10 MG tablet   Oral   Take 10 mg by mouth daily as needed (for asthma flares).          . Multiple Vitamin (MULTIVITAMIN WITH MINERALS) TABS   Oral   Take 1 tablet by mouth daily.           BP 132/69  Pulse 83  Temp(Src) 98 F (36.7 C)  Resp 20  SpO2 100%  LMP 09/10/2006  Physical Exam  Nursing note and vitals reviewed. Constitutional: She is oriented to person, place, and time. She appears well-developed and well-nourished. No distress.  HENT:  Head: Normocephalic.  Eyes: Conjunctivae are normal.  Neck: Neck supple.   Cardiovascular: Normal rate, regular rhythm and normal heart sounds.   Pulmonary/Chest: Effort normal and breath sounds normal. No respiratory distress. She has no wheezes. She has no rales.  Abdominal: Soft. Bowel sounds are normal. She exhibits no distension. There is tenderness. There is no rebound.  Diffuse tenderness. No CVA tenderness  Musculoskeletal: She exhibits no edema.  Neurological: She is alert and oriented to person, place, and time.  Skin: Skin is warm and dry.    ED Course  Procedures (including critical care time)  Pt seen and examined. Pain suggestive of a renal colic. Hx of the same. Abdomen with no guarding no rebound, no surgical abdomen. CT abd pelvis wo contrast and labs ordered.  Results for orders placed during the hospital encounter of 12/09/12  URINALYSIS, ROUTINE W REFLEX MICROSCOPIC      Result Value Range   Color, Urine YELLOW  YELLOW   APPearance CLEAR  CLEAR   Specific Gravity, Urine 1.019  1.005 - 1.030   pH 7.5  5.0 - 8.0   Glucose, UA NEGATIVE  NEGATIVE mg/dL   Hgb urine dipstick MODERATE (*) NEGATIVE   Bilirubin Urine NEGATIVE  NEGATIVE   Ketones, ur NEGATIVE  NEGATIVE mg/dL   Protein, ur NEGATIVE  NEGATIVE mg/dL   Urobilinogen, UA 1.0  0.0 - 1.0 mg/dL   Nitrite NEGATIVE  NEGATIVE   Leukocytes, UA MODERATE (*) NEGATIVE  CBC WITH DIFFERENTIAL      Result Value Range   WBC 6.9  4.0 - 10.5 K/uL   RBC 4.37  3.87 - 5.11 MIL/uL   Hemoglobin 13.4  12.0 - 15.0 g/dL   HCT 16.1  09.6 - 04.5 %   MCV 91.8  78.0 - 100.0 fL   MCH 30.7  26.0 - 34.0 pg   MCHC 33.4  30.0 - 36.0 g/dL   RDW 40.9  81.1 - 91.4 %   Platelets 249  150 - 400 K/uL   Neutrophils Relative 67  43 - 77 %   Neutro Abs 4.6  1.7 - 7.7 K/uL   Lymphocytes Relative 26  12 - 46 %   Lymphs Abs 1.8  0.7 - 4.0 K/uL   Monocytes Relative 5  3 - 12 %   Monocytes Absolute 0.4  0.1 - 1.0 K/uL   Eosinophils Relative 2  0 - 5 %   Eosinophils Absolute 0.1  0.0 - 0.7 K/uL   Basophils Relative 0   0 - 1 %   Basophils Absolute 0.0  0.0 - 0.1 K/uL  URINE MICROSCOPIC-ADD ON      Result Value Range   Squamous Epithelial / LPF FEW (*) RARE   WBC, UA 3-6  <3 WBC/hpf   RBC / HPF 21-50  <3 RBC/hpf   Bacteria, UA RARE  RARE  POCT I-STAT, CHEM 8      Result Value Range   Sodium 140  135 - 145 mEq/L   Potassium 5.0  3.5 - 5.1 mEq/L   Chloride 107  96 - 112 mEq/L   BUN 17  6 - 23 mg/dL   Creatinine, Ser 9.62  0.50 - 1.10 mg/dL   Glucose, Bld 952 (*) 70 - 99 mg/dL   Calcium, Ion 8.41  3.24 - 1.30 mmol/L   TCO2 26  0 - 100 mmol/L   Hemoglobin 13.9  12.0 - 15.0 g/dL   HCT 40.1  02.7 - 25.3 %   Ct Abdomen Pelvis Wo Contrast  12/09/2012  *RADIOLOGY REPORT*  Clinical Data: 62 year old female with right flank, abdominal and pelvic pain.  CT ABDOMEN AND PELVIS WITHOUT CONTRAST  Technique:  Multidetector CT imaging of the abdomen and pelvis was performed following the standard protocol without intravenous contrast.  Comparison: 03/24/2009 CT  Findings: A 2 mm right UVJ calculus causes very mild right hydroureteronephrosis. Bilateral nonobstructing renal calculi are identified including a few scattered punctate calculi as well as a 3 mm right lower pole calculus, a 4 mm mid left renal calculus and a 2 mm left lower pole calculus.  The liver, spleen, adrenal glands, pancreas, and gallbladder are unremarkable. Please note that parenchymal abnormalities may be missed as intravenous contrast was not administered.  No free fluid, enlarged lymph nodes, biliary dilation or abdominal aortic aneurysm identified.  The bowel is unremarkable. No acute or suspicious bony abnormalities identified.  IMPRESSION: 2 mm right UVJ calculus with very mild right hydroureteronephrosis.  Nonobstructing bilateral renal calculi.   Original Report Authenticated By: Harmon Pier, M.D.       1. Kidney stone       MDM  Pt with right flan and right abdominal pain. Labs normal. UA does not appear infected. CT showing 2mm right UVJ  calculus. Pt's pain controlled with fentanyl 50mg  IV, toradol 30mg  IV, flomax, zofran. Pt feels much better. Pain resolved. Tolerating PO fluids and crackers. Afebrile. Otherwise non toxic. Will d/c home with follow up.   Filed Vitals:   12/09/12 0841 12/09/12 0941  BP: 132/69   Pulse: 83 80  Temp: 98 F (36.7 C)   Resp: 20   SpO2: 100% 100%         Maurio Baize A Quantay Zaremba, PA-C 12/09/12 1141

## 2012-12-09 NOTE — ED Notes (Signed)
Per pt: Pt had right lower abdominal pain this morning that radiated to back; accompanied with nausea.

## 2012-12-09 NOTE — ED Notes (Signed)
MD at bedside./ PA

## 2012-12-15 ENCOUNTER — Telehealth: Payer: Self-pay

## 2012-12-15 NOTE — Telephone Encounter (Signed)
To you, and in the future can we make sure this is taken as a staff message instead of phone call? Perhaps you could talk to [facility] about this.

## 2012-12-15 NOTE — Telephone Encounter (Signed)
Pt called upset about her OV charge from 08/30/12. Says she was here for a med refill and had the provider look at a couple moles, then we referred her. Pt would like the provider to reconsider the OV charge. The visit went to her deductible so she is essentially paying in full. I advised her the billing office could not make this determination.  Pt [name]  [phone]

## 2013-01-01 ENCOUNTER — Other Ambulatory Visit: Payer: Self-pay | Admitting: Family Medicine

## 2013-01-02 ENCOUNTER — Telehealth: Payer: Self-pay | Admitting: Radiology

## 2013-01-02 ENCOUNTER — Ambulatory Visit (INDEPENDENT_AMBULATORY_CARE_PROVIDER_SITE_OTHER): Payer: PRIVATE HEALTH INSURANCE | Admitting: Family Medicine

## 2013-01-02 VITALS — BP 130/80 | HR 94 | Temp 98.7°F | Resp 16 | Ht 63.5 in | Wt 161.0 lb

## 2013-01-02 DIAGNOSIS — R059 Cough, unspecified: Secondary | ICD-10-CM | POA: Insufficient documentation

## 2013-01-02 DIAGNOSIS — F411 Generalized anxiety disorder: Secondary | ICD-10-CM | POA: Insufficient documentation

## 2013-01-02 DIAGNOSIS — R05 Cough: Secondary | ICD-10-CM

## 2013-01-02 MED ORDER — ALBUTEROL SULFATE HFA 108 (90 BASE) MCG/ACT IN AERS: 2.0000 | INHALATION_SPRAY | Freq: Four times a day (QID) | RESPIRATORY_TRACT | Status: DC | PRN

## 2013-01-02 MED ORDER — ALBUTEROL SULFATE (5 MG/ML) 0.5% IN NEBU
5.0000 mg | INHALATION_SOLUTION | RESPIRATORY_TRACT | Status: AC
  Administered 2013-01-02: 5 mg via RESPIRATORY_TRACT

## 2013-01-02 MED ORDER — DIAZEPAM 5 MG PO TABS: 5.0000 mg | ORAL_TABLET | Freq: Two times a day (BID) | ORAL | Status: DC | PRN

## 2013-01-02 MED ORDER — PREDNISONE 50 MG PO TABS: 50.0000 mg | ORAL_TABLET | Freq: Every day | ORAL | Status: DC

## 2013-01-02 MED ORDER — GUAIFENESIN-CODEINE 100-10 MG/5ML PO SYRP: 5.0000 mL | ORAL_SOLUTION | Freq: Three times a day (TID) | ORAL | Status: DC | PRN

## 2013-01-02 NOTE — Progress Notes (Addendum)
[name] is a 62 y.o. female who presents to [facility] today for cough. Patient notes nonproductive cough over the last 2 weeks associated with a sore throat and a tickling sensation. She notes this is somewhat consistent with prior episodes of asthma when she was a child. She notes that she has seasonal allergies and reflux. She has been treating her seasonal allergies with Zyrtec, and Flonase. She is not taking the Flonase regularly. Additionally she notes reflux for which she has stopped taking omeprazole and using a diet modification which has worked until recently. She notes some chest tightness but denies any chest pain palpitations or shortness of breath. She denies any syncope as well. Additionally she notes that she is under a great deal of stress. She's been separated from her husband for the last year. He recently moved back in the trial get-together is not going as well as either hoped it would.  Overall she is concerned that she may be having worsening allergies and notes the cough is quite bothersome and keeping her up at night.   Additionally patient has run out of her Valium. It has been many months since her last use. She notes this particularly stressful time in her life she would like a refill. She was previously scheduled to see her primary care Dr. Dallas Schimke however she had to leave earlier. She denies any SI/HI  PMH: Reviewed allergies, GERD. History of double mastectomy in 2012. History  Substance Use Topics  . Smoking status: Never Smoker   . Smokeless tobacco: Not on file  . Alcohol Use: Yes     Comment: occasional   ROS as above  Medications reviewed. Current Outpatient Prescriptions  Medication Sig Dispense Refill  . albuterol (PROVENTIL HFA;VENTOLIN HFA) 108 (90 BASE) MCG/ACT inhaler Inhale 2 puffs into the lungs every 6 (six) hours as needed for wheezing or shortness of breath.       . cetirizine (ZYRTEC) 10 MG tablet Take 10 mg by mouth daily as needed for allergies.        . diazepam (VALIUM) 5 MG tablet Take 5 mg by mouth 2 (two) times daily as needed for anxiety.      . DULoxetine (CYMBALTA) 60 MG capsule Take 1 capsule (60 mg total) by mouth daily. Needs office visit  90 capsule  1  . fluticasone (FLONASE) 50 MCG/ACT nasal spray Place 2 sprays into the nose as needed for rhinitis.      Marland Kitchen montelukast (SINGULAIR) 10 MG tablet Take 10 mg by mouth daily as needed (for asthma flares).       . calcium-vitamin D (OSCAL WITH D) 500-200 MG-UNIT per tablet Take 1 tablet by mouth daily.      . cholecalciferol (VITAMIN D) 1000 UNITS tablet Take 1,000 Units by mouth daily.      . magnesium oxide (MAG-OX) 400 MG tablet Take 400 mg by mouth daily.      . Multiple Vitamin (MULTIVITAMIN WITH MINERALS) TABS Take 1 tablet by mouth daily.      . ondansetron (ZOFRAN ODT) 8 MG disintegrating tablet Take 1 tablet (8 mg total) by mouth every 8 (eight) hours as needed for nausea.  12 tablet  0  . oxyCODONE-acetaminophen (PERCOCET) 5-325 MG per tablet Take 1 tablet by mouth every 4 (four) hours as needed for pain.  20 tablet  0  . tamsulosin (FLOMAX) 0.4 MG CAPS Take 1 capsule (0.4 mg total) by mouth daily.  10 capsule  0   Current Facility-Administered  Medications  Medication Dose Route Frequency Provider Last Rate Last Dose  . albuterol (PROVENTIL) (5 MG/ML) 0.5% nebulizer solution 5 mg  5 mg Nebulization NOW Rodolph Bong, MD        Exam:  BP 130/80  Pulse 94  Temp(Src) 98.7 F (37.1 C) (Oral)  Resp 16  Ht 5' 3.5" (1.613 m)  Wt 161 lb (73.029 kg)  BMI 28.07 kg/m2  SpO2 97%  LMP 09/10/2006 Gen: Well NAD HEENT: EOMI,  MMM, cobblestoning of posterior pharynx. Normal tympanic membranes bilaterally Lungs: CTABL Nl WOB Heart: RRR no MRG Abd: NABS, NT, ND Exts: Non edematous BL  LE, warm and well perfused.  Psych: Alert and oriented normally conversant normal speech and affect. No SI/H.   No results found for this or any previous visit (from the past 72  hour(s)).  Assessment and Plan: 62 y.o. female with  1) cough: Likely allergy/asthma. Plan to treat with prednisone, albuterol. Additionally we'll continue omeprazole and increase Flonase. Discussed warning signs or symptoms. Please see discharge instructions. Patient expresses understanding. 2) anxiety: Continue SNRI. Refill Valium. Followup with primary care Dr.   I have reviewed and agree with documentation. Robert P. Merla Riches, M.D.

## 2013-01-02 NOTE — Patient Instructions (Addendum)
Thank you for coming in today. 1) Cough: Please take the prednisone and albuterol as needed. Also restart the omeprazole and flonase.  Come back if not improving.  Call or go to the emergency room if you get worse, have trouble breathing, have chest pains, or palpitations.  2) Anxiety: Refill valium. Please follow up with your primary doctor for further prescriptions.  Good luck with this rough time in your life.  Let us know if we can help you with anything.

## 2013-01-02 NOTE — Telephone Encounter (Signed)
Patient asking if you will refill her Valium please advise.

## 2013-01-02 NOTE — Addendum Note (Signed)
Addended by: Rodolph Bong on: 01/02/2013 06:53 PM   Modules accepted: Orders

## 2013-01-03 NOTE — Telephone Encounter (Signed)
Patient seen last night and med refilled then.  Due for OV - physical in July or early august based on prior ov.

## 2013-01-23 ENCOUNTER — Telehealth: Payer: Self-pay

## 2013-01-23 NOTE — Telephone Encounter (Signed)
Error

## 2013-01-29 ENCOUNTER — Ambulatory Visit: Payer: PRIVATE HEALTH INSURANCE | Admitting: Family Medicine

## 2013-03-04 ENCOUNTER — Other Ambulatory Visit: Payer: Self-pay | Admitting: Family Medicine

## 2013-04-10 ENCOUNTER — Other Ambulatory Visit: Payer: Self-pay | Admitting: Physician Assistant

## 2013-04-11 NOTE — Telephone Encounter (Signed)
Needs OV, second notice

## 2013-05-02 ENCOUNTER — Ambulatory Visit (INDEPENDENT_AMBULATORY_CARE_PROVIDER_SITE_OTHER): Payer: PRIVATE HEALTH INSURANCE | Admitting: Emergency Medicine

## 2013-05-02 VITALS — BP 130/78 | HR 93 | Temp 98.2°F | Resp 18 | Ht 63.0 in | Wt 164.0 lb

## 2013-05-02 DIAGNOSIS — M545 Low back pain: Secondary | ICD-10-CM

## 2013-05-02 DIAGNOSIS — N201 Calculus of ureter: Secondary | ICD-10-CM

## 2013-05-02 DIAGNOSIS — R112 Nausea with vomiting, unspecified: Secondary | ICD-10-CM

## 2013-05-02 LAB — POCT URINALYSIS DIPSTICK
Bilirubin, UA: NEGATIVE
Blood, UA: NEGATIVE
Glucose, UA: NEGATIVE
Ketones, UA: NEGATIVE
Nitrite, UA: NEGATIVE
Spec Grav, UA: 1.02
pH, UA: 8.5

## 2013-05-02 LAB — POCT UA - MICROSCOPIC ONLY
Bacteria, U Microscopic: NEGATIVE
Casts, Ur, LPF, POC: NEGATIVE
Crystals, Ur, HPF, POC: NEGATIVE
Mucus, UA: NEGATIVE
RBC, urine, microscopic: NEGATIVE

## 2013-05-02 MED ORDER — KETOROLAC TROMETHAMINE 60 MG/2ML IM SOLN
60.0000 mg | Freq: Once | INTRAMUSCULAR | Status: AC
  Administered 2013-05-02: 60 mg via INTRAMUSCULAR

## 2013-05-02 NOTE — Progress Notes (Signed)
[facility] [address] [place] [address] [id number]  Date:  05/02/2013   Name:  [name]   DOB:  13-Jan-1951   MRN:  [id number]  PCP:  Abbe Amsterdam, MD    Chief Complaint: Back Pain and bladder pain   History of Present Illness:  [name] is a 62 y.o. very pleasant female patient who presents with the following:   with complaint of a left flank pain. States pain started at 0430. Started in left flank, now radiating down left abdomen.States last night though pain worsened to a 10/10. States was diaphoretic, nausea, vomiting. This morning had  blood in urine, no dysuria. No fever. Denies changes in bowels, no diarrhea. Did not take any medications for pain prior to coming in.  No GYN complaints. No improvement with over the counter medications or other home remedies. Denies other complaint or health concern today.    Patient Active Problem List   Diagnosis Date Noted  . Cough 01/02/2013  . Anxiety state, unspecified 01/02/2013  . Breast cancer (DCIS), stage 0, Left, receptor +, dx 2012 09/08/2010    Past Medical History  Diagnosis Date  . Asthma   . Rectal bleeding     hemorroid  . Hiatal hernia   . Kidney stone   . Sinus drainage   . Contact lens/glasses fitting   . Bruises easily   . Arthritis   . Anxiety   . Allergy   . Cancer     double mastectomy  . Osteoporosis   . Heart murmur   . Depression     Past Surgical History  Procedure Laterality Date  . Breast lumpectomy w/ needle localization  10/05/2010    lumpectomy  . Ovarian cyst surgery  1997, 2001, 2008  . Mastectomy w/ sentinel node biopsy  01/26/2011  . Simple mastectomy  01/26/2011  . Appendectomy    . Breast surgery      History  Substance Use Topics  . Smoking status: Never Smoker   . Smokeless tobacco: Not on file  . Alcohol Use: Yes     Comment: occasional    Family History  Problem Relation Age of Onset  . Hypertension Mother   . Heart  Problems Mother   . Hypertension Father   . Heart failure Paternal Grandmother   . Heart failure Paternal Grandfather   . Cancer Brother     Allergies  Allergen Reactions  . Elavil [Amitriptyline Hcl] Rash  . Sulfur Rash    Medication list has been reviewed and updated.  Current Outpatient Prescriptions on File Prior to Visit  Medication Sig Dispense Refill  . calcium-vitamin D (OSCAL WITH D) 500-200 MG-UNIT per tablet Take 1 tablet by mouth daily.      . cetirizine (ZYRTEC) 10 MG tablet Take 10 mg by mouth daily as needed for allergies.       . cholecalciferol (VITAMIN D) 1000 UNITS tablet Take 1,000 Units by mouth daily.      . diazepam (VALIUM) 5 MG tablet Take 1 tablet (5 mg total) by mouth 2 (two) times daily as needed for anxiety.  30 tablet  0  . DULoxetine (CYMBALTA) 60 MG capsule Take 1 capsule (60 mg total) by mouth daily. NEED VISIT!  30 capsule  0  . fluticasone (FLONASE) 50 MCG/ACT nasal spray Place 2 sprays into the nose as needed for rhinitis.      . magnesium oxide (MAG-OX) 400 MG tablet Take 400 mg  by mouth daily.      . Multiple Vitamin (MULTIVITAMIN WITH MINERALS) TABS Take 1 tablet by mouth daily.      Marland Kitchen albuterol (PROVENTIL HFA;VENTOLIN HFA) 108 (90 BASE) MCG/ACT inhaler Inhale 2 puffs into the lungs every 6 (six) hours as needed for wheezing or shortness of breath.  1 Inhaler  1  . guaiFENesin-codeine (ROBITUSSIN AC) 100-10 MG/5ML syrup Take 5 mLs by mouth 3 (three) times daily as needed for cough.  120 mL  0  . montelukast (SINGULAIR) 10 MG tablet Take 10 mg by mouth daily as needed (for asthma flares).       . ondansetron (ZOFRAN ODT) 8 MG disintegrating tablet Take 1 tablet (8 mg total) by mouth every 8 (eight) hours as needed for nausea.  12 tablet  0  . oxyCODONE-acetaminophen (PERCOCET) 5-325 MG per tablet Take 1 tablet by mouth every 4 (four) hours as needed for pain.  20 tablet  0  . predniSONE (DELTASONE) 50 MG tablet Take 1 tablet (50 mg total) by mouth  daily.  5 tablet  0  . tamsulosin (FLOMAX) 0.4 MG CAPS Take 1 capsule (0.4 mg total) by mouth daily.  10 capsule  0   No current facility-administered medications on file prior to visit.    Review of Systems:  As per HPI, otherwise negative.    Physical Examination: Filed Vitals:   05/02/13 0927  BP: 130/78  Pulse: 93  Temp: 98.2 F (36.8 C)  Resp: 18   Filed Vitals:   05/02/13 0927  Height: 5\' 3"  (1.6 m)  Weight: 164 lb (74.39 kg)   Body mass index is 29.06 kg/(m^2). Ideal Body Weight: Weight in (lb) to have BMI = 25: 140.8  GEN: WDWN, moderately distressed, holding her left CVA, Non-toxic, A & O x 3 HEENT: Atraumatic, Normocephalic. Neck supple. No masses, No LAD. Ears and Nose: No external deformity. CV: RRR, No M/G/R. No JVD. No thrill. No extra heart sounds. PULM: CTA B, no wheezes, crackles, rhonchi. No retractions. No resp. distress. No accessory muscle use. ABD: S, NT, ND, +BS. No rebound. No HSM. EXTR: No c/c/e NEURO Normal gait.  PSYCH: Normally interactive. Conversant. Not depressed or anxious appearing.  Calm demeanor.    Assessment and Plan: Kidney stone Resume meds Urology consultation   Signed,  Phillips Odor, MD   Results for orders placed in visit on 05/02/13  POCT URINALYSIS DIPSTICK      Result Value Range   Color, UA yellow     Clarity, UA sl cloudy     Glucose, UA neg     Bilirubin, UA neg     Ketones, UA neg     Spec Grav, UA 1.020     Blood, UA neg     pH, UA 8.5     Protein, UA neg     Urobilinogen, UA 1.0     Nitrite, UA neg     Leukocytes, UA Negative    POCT UA - MICROSCOPIC ONLY      Result Value Range   WBC, Ur, HPF, POC neg     RBC, urine, microscopic neg     Bacteria, U Microscopic neg     Mucus, UA neg     Epithelial cells, urine per micros neg     Crystals, Ur, HPF, POC neg     Casts, Ur, LPF, POC neg     Yeast, UA neg

## 2013-05-11 ENCOUNTER — Telehealth: Payer: Self-pay

## 2013-05-11 NOTE — Telephone Encounter (Signed)
Call her in 30 please

## 2013-05-11 NOTE — Telephone Encounter (Signed)
Dr. Dareen Piano   Patient is going to take seniors to [facility]  She is concerned with the number of Kidney stones present and is requesting hydro- codone.  States she was at the hospital and they prescribed her some.  She will be gone for a week.  [facility]   Cell phone  [phone]

## 2013-05-13 MED ORDER — HYDROCODONE-ACETAMINOPHEN 5-325 MG PO TABS: 1.0000 | ORAL_TABLET | Freq: Four times a day (QID) | ORAL | Status: DC | PRN

## 2013-05-13 NOTE — Telephone Encounter (Signed)
Faxed; pt advised

## 2013-06-14 ENCOUNTER — Other Ambulatory Visit: Payer: Self-pay | Admitting: Physician Assistant

## 2013-06-16 ENCOUNTER — Other Ambulatory Visit: Payer: Self-pay | Admitting: Family Medicine

## 2013-06-16 DIAGNOSIS — F411 Generalized anxiety disorder: Secondary | ICD-10-CM

## 2013-06-18 NOTE — Telephone Encounter (Signed)
I refilled Valium, but needs ov to discuss befoe this runs out. Plan at January OV was 6 month follow up, and then when seen by Dr. Denyse Amass, discussed follow up with PCP.  Dr. Cyndie Chime name was mentioned then as primary provider - if this is who she is regularly followed by, then follow up with Dr. Patsy Lager in next 1-2 months to discuss this medicine further. Let me know if there are any questions.

## 2013-06-19 NOTE — Telephone Encounter (Signed)
Pt agreed to RTC w/in the mos and see either Dr Patsy Lager or Dr Neva Seat who she has been seeing recently.

## 2013-07-02 ENCOUNTER — Telehealth: Payer: Self-pay

## 2013-07-02 NOTE — Telephone Encounter (Signed)
[facility] [address] [facility] regarding an [facility] for xanax Please call pharmacy at [phone]

## 2013-07-05 NOTE — Telephone Encounter (Signed)
Patient calling for Valium Rx, please advise. She states not Xanax, is Valium. This was last given by Dr Neva Seat, but not his patient. Please review and advise where to route this Rx request.

## 2013-07-05 NOTE — Telephone Encounter (Signed)
Her chart states that her PCP is Dr Patsy Lager but she has not seen her in [place].  Please explain our controlled substance policy and request that she RTC to see Dr Patsy Lager for more meds.

## 2013-07-06 NOTE — Telephone Encounter (Signed)
Gave pt message that she needs to come in and see Dr Patsy Lager. She states that she has been seeing Dr Neva Seat lately, but according to [facility], she hasn't seen him since 1/14. I advised her she would need to come back in to see Dr Patsy Lager since Benny Lennert said this is who her PCP. Pt seemed irritated and hung up.

## 2013-07-13 ENCOUNTER — Other Ambulatory Visit: Payer: Self-pay | Admitting: Physician Assistant

## 2013-08-14 ENCOUNTER — Ambulatory Visit (INDEPENDENT_AMBULATORY_CARE_PROVIDER_SITE_OTHER): Payer: PRIVATE HEALTH INSURANCE | Admitting: Family Medicine

## 2013-08-14 VITALS — BP 138/82 | HR 95 | Temp 99.1°F | Resp 18 | Ht 63.5 in | Wt 165.0 lb

## 2013-08-14 DIAGNOSIS — IMO0002 Reserved for concepts with insufficient information to code with codable children: Secondary | ICD-10-CM

## 2013-08-14 DIAGNOSIS — M6740 Ganglion, unspecified site: Secondary | ICD-10-CM

## 2013-08-14 DIAGNOSIS — G4700 Insomnia, unspecified: Secondary | ICD-10-CM

## 2013-08-14 DIAGNOSIS — F411 Generalized anxiety disorder: Secondary | ICD-10-CM

## 2013-08-14 MED ORDER — DULOXETINE HCL 60 MG PO CPEP: 60.0000 mg | ORAL_CAPSULE | Freq: Every day | ORAL | Status: DC

## 2013-08-14 MED ORDER — DIAZEPAM 5 MG PO TABS: 5.0000 mg | ORAL_TABLET | Freq: Two times a day (BID) | ORAL | Status: DC | PRN

## 2013-08-14 NOTE — Patient Instructions (Addendum)
Continue same doses of medicines for now. Recheck for physical in next 6 months - we will call you to schedule this. Exercise as discussed (drink plenty of water). If any lab work done at gynecologist, bring copy to your physical. Try not to push on bump on hand, but if this increases in size, locks, or more painful, recheck.  Return to the clinic or go to the nearest emergency room if any of your symptoms worsen or new symptoms occur.  [facility] Policy for Prescribing Controlled Substances (Revised 06/2012) 1. Prescriptions for controlled substances will be filled by ONE provider at [facility] with whom you have established and developed a plan for your care, including follow-up. 2. You are encouraged to schedule an appointment with your prescriber at our appointment center for follow-up visits whenever possible. 3. If you request a prescription for the controlled substance while at [facility] for an acute problem (with someone other than your regular prescriber), you MAY be given a ONE-TIME prescription for a 30-day supply of the controlled substance, to allow time for you to return to see your regular prescriber for additional prescriptions.

## 2013-08-14 NOTE — Progress Notes (Addendum)
Subjective:    Patient ID: [name], female    DOB: [birth year], 62 y.o.   MRN: [id number]  This chart was scribed for Shade Flood, MD by Blanchard Kelch, ED Scribe. The patient was seen in room 2. Patient's care was started at 6:02 PM.  Authored by Silas Sacramento, MD, unable to change in [facility].  Chief Complaint  Patient presents with  . rx refills    valium   . cyst right hand    tenderness    PCP: Abbe Amsterdam, MD   HPI  [name] is a 62 y.o. female who presents to office for a medication refill. Last visit with me was January of this year. History of depression with anxiety with marital life stressors as well as history of breast cancer (2012). Status post bilateral mastectomy ( right mastectomy for prophylaxis). Usually on Cymbalta 60 mg QD. Added Valium, 2 mg tablets 1/2 QHS prn for insomnia problems last visit. Last prescription of 30 on 06/16/13.  She is still taking Cymbalta 60 mg today. She states that she got divorced 12/19 of this year, eleven days ago. She states the marriage was heading that direction for about four years. She states that she feels mildly relieved because "the door is finally closed." She also states that her mother is requiring a lot of care recently, which is adding anxiety and resentment within the family. She denies ever going to counseling other than pastoral counseling. She has not been in awhile. She states that he job is going well and she has been singing a lot lately. 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 She has been out of her Valium for awhile. She states that she was taking it to help with sleep about once a week.   She also believes she has gained some weight. She has not been exercising lately but is thinking about joining [employer].   She states that she has a cyst on her right hand that she noticed after reaching for a door knob and feeling focal pain. She noticed a knot on second digit under the knuckle. She denies  noticing it prior to yesterday. She denies injury to the finger or changes in activity or use. She denies using any medication for relief.  She also has had two kidney stones and still have more that need to be passed. She is being seen by Urology for the stones. She states that she was told to drink more water and has been trying to. 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    Patient Active Problem List   Diagnosis Date Noted  . Cough 01/02/2013  . Anxiety state, unspecified 01/02/2013  . Breast cancer (DCIS), stage 0, Left, receptor +, dx 2012 09/08/2010   Past Medical History  Diagnosis Date  . Asthma   . Rectal bleeding     hemorroid  . Hiatal hernia   . Kidney stone   . Sinus drainage   . Contact lens/glasses fitting   . Bruises easily   . Arthritis   . Anxiety   . Allergy   . Cancer     double mastectomy  . Osteoporosis   . Heart murmur   . Depression    Past Surgical History  Procedure Laterality Date  . Breast lumpectomy w/ needle localization  10/05/2010    lumpectomy  . Ovarian cyst surgery  1997, 2001, 2008  .  Mastectomy w/ sentinel node biopsy  01/26/2011  . Simple mastectomy  01/26/2011  . Appendectomy    . Breast surgery     Allergies  Allergen Reactions  . Elavil [Amitriptyline Hcl] Rash  . Sulfur Rash   Prior to Admission medications   Medication Sig Start Date End Date Taking? Authorizing Provider  calcium-vitamin D (OSCAL WITH D) 500-200 MG-UNIT per tablet Take 1 tablet by mouth daily.   Yes Historical Provider, MD  cetirizine (ZYRTEC) 10 MG tablet Take 10 mg by mouth daily as needed for allergies.    Yes Historical Provider, MD  cholecalciferol (VITAMIN D) 1000 UNITS tablet Take 1,000 Units by mouth daily.   Yes Historical Provider, MD  DULoxetine (CYMBALTA) 60 MG capsule Take 1 capsule (60 mg total) by mouth daily. PATIENT NEEDS OFFICE VISIT FOR ADDITIONAL REFILLS 06/14/13  Yes Heather M Marte, PA-C    fluticasone [facility]) 50 MCG/ACT nasal spray USE 2 SPRAYS IN EACH NOSTRIL EVERY DAY AS DIRECTED 07/13/13  Yes Morrell Riddle, PA-C  albuterol (PROVENTIL HFA;VENTOLIN HFA) 108 (90 BASE) MCG/ACT inhaler Inhale 2 puffs into the lungs every 6 (six) hours as needed for wheezing or shortness of breath. 01/02/13   Rodolph Bong, MD  diazepam (VALIUM) 5 MG tablet TAKE 1 TABLET BY MOUTH TWICE A DAY AS NEEDED FOR ANXIETY 06/16/13   Shade Flood, MD  guaiFENesin-codeine [facility]) 100-10 MG/5ML syrup Take 5 mLs by mouth 3 (three) times daily as needed for cough. 01/02/13   Rodolph Bong, MD  HYDROcodone-acetaminophen (NORCO/VICODIN) 5-325 MG per tablet Take 1 tablet by mouth every 6 (six) hours as needed for pain. 05/13/13   Phillips Odor, MD  magnesium oxide (MAG-OX) 400 MG tablet Take 400 mg by mouth daily.    Historical Provider, MD  montelukast (SINGULAIR) 10 MG tablet Take 10 mg by mouth daily as needed (for asthma flares).     Historical Provider, MD  Multiple Vitamin (MULTIVITAMIN WITH MINERALS) TABS Take 1 tablet by mouth daily.    Historical Provider, MD  ondansetron (ZOFRAN ODT) 8 MG disintegrating tablet Take 1 tablet (8 mg total) by mouth every 8 (eight) hours as needed for nausea. 12/09/12   Tatyana A Kirichenko, PA-C  OVER THE COUNTER MEDICATION Belly trim xp    Historical Provider, MD  OVER THE COUNTER MEDICATION Omega krill 5x    Historical Provider, MD  OVER THE COUNTER MEDICATION Absorb max    Historical Provider, MD  oxyCODONE-acetaminophen (PERCOCET) 5-325 MG per tablet Take 1 tablet by mouth every 4 (four) hours as needed for pain. 12/09/12   Tatyana A Kirichenko, PA-C  predniSONE (DELTASONE) 50 MG tablet Take 1 tablet (50 mg total) by mouth daily. 01/02/13   Rodolph Bong, MD  tamsulosin (FLOMAX) 0.4 MG CAPS Take 1 capsule (0.4 mg total) by mouth daily. 12/09/12   Tatyana A Kirichenko, PA-C   History   Social History  . Marital Status: Married    Spouse Name: N/A    Number of  Children: N/A  . Years of Education: N/A   Occupational History  . Not on file.   Social History Main Topics  . Smoking status: Never Smoker   . Smokeless tobacco: Not on file  . Alcohol Use: Yes     Comment: occasional  . Drug Use: No  . Sexual Activity: No   Other Topics Concern  . Not on file   Social History Narrative  . No narrative on file  Review of Systems  Constitutional: Negative for fever.  HENT: Negative for drooling.   Eyes: Negative for discharge.  Respiratory: Negative for cough.   Cardiovascular: Negative for leg swelling.  Gastrointestinal: Negative for vomiting.  Endocrine: Negative for polyuria.  Genitourinary: Negative for hematuria.  Musculoskeletal: Negative for gait problem.  Skin: Negative for rash.  Allergic/Immunologic: Negative for immunocompromised state.  Neurological: Negative for speech difficulty.  Hematological: Negative for adenopathy.  Psychiatric/Behavioral: Positive for sleep disturbance and dysphoric mood. Negative for confusion. The patient is nervous/anxious.        Objective:   Physical Exam  Nursing note and vitals reviewed. Constitutional: She is oriented to person, place, and time. She appears well-developed and well-nourished. No distress.  HENT:  Head: Normocephalic and atraumatic.  Eyes: Conjunctivae and EOM are normal. Pupils are equal, round, and reactive to light.  Neck: Neck supple. Carotid bruit is not present. No tracheal deviation present.  Cardiovascular: Normal rate, regular rhythm, normal heart sounds and intact distal pulses.   Pulmonary/Chest: Effort normal and breath sounds normal. No respiratory distress. She has no wheezes. She has no rales.  Abdominal: Soft. She exhibits no pulsatile midline mass. There is no tenderness.  Musculoskeletal: Normal range of motion.  Neurological: She is alert and oriented to person, place, and time.  Skin: Skin is warm and dry. No erythema.  Small firm nodular area  approximately 2 mm, ulnar sided to flexor tendon at 2nd MCP. Skin is intact. No warmth or erythema. Does not move with flexor tendon. Full strength with testing flexor and extensor tendons. Full ROM at MCP. No triggering or locking appreciated.  Psychiatric: She has a normal mood and affect. Her behavior is normal.   Filed Vitals:   08/14/13 1718  BP: 138/82  Pulse: 95  Temp: 99.1 F (37.3 C)  TempSrc: Oral  Resp: 18  Height: 5' 3.5" (1.613 m)  Weight: 165 lb (74.844 kg)  SpO2: 98%       Assessment & Plan:   [name] is a 62 y.o. female Anxiety state, unspecified - Plan: diazepam (VALIUM) 5 MG tablet, DULoxetine (CYMBALTA) 60 MG capsule - stable, even with recent divorce. Will cont same dose Cymbalta for now, Valium if needed, and plan on starting exercise as other coping technique that may also help with goal of weight loss. Plan on evaluating need for Cymbalta at CPE in 6 months (plans on me as PCP, labs form GYN if drawn). Declined names for other counseling at present - has these at home.   Insomnia - Plan: DULoxetine (CYMBALTA) 60 MG capsule - stable - as above.   Cyst in hand - ? Cyst on flexor tendon, but does not move with mvmt. Small lipoma also possible. Avoid pressing on area, recheck if persists or sooner if bigger or triggers.    Meds ordered this encounter  Medications  . diazepam (VALIUM) 5 MG tablet    Sig: Take 1 tablet (5 mg total) by mouth every 12 (twelve) hours as needed for anxiety.    Dispense:  30 tablet    Refill:  1  . DULoxetine (CYMBALTA) 60 MG capsule    Sig: Take 1 capsule (60 mg total) by mouth daily.    Dispense:  90 capsule    Refill:  1   Patient Instructions  Continue same doses of medicines for now. Recheck for physical in next 6 months - we will call you to schedule this. Exercise as discussed (drink plenty of water). If  any lab work done at gynecologist, bring copy to your physical. Try not to push on bump on hand, but if this  increases in size, locks, or more painful, recheck.  Return to the clinic or go to the nearest emergency room if any of your symptoms worsen or new symptoms occur.  [facility] Policy for Prescribing Controlled Substances (Revised 06/2012) 1. Prescriptions for controlled substances will be filled by ONE provider at [facility] with whom you have established and developed a plan for your care, including follow-up. 2. You are encouraged to schedule an appointment with your prescriber at our appointment center for follow-up visits whenever possible. 3. If you request a prescription for the controlled substance while at [facility] for an acute problem (with someone other than your regular prescriber), you MAY be given a ONE-TIME prescription for a 30-day supply of the controlled substance, to allow time for you to return to see your regular prescriber for additional prescriptions.   I personally performed the services described in this documentation, which was scribed in my presence. The recorded information has been reviewed and considered, and addended by me as needed.

## 2013-08-15 NOTE — Progress Notes (Signed)
Patient would like a call back on Monday to schedule appointment when she is at her office with her calender.

## 2013-09-10 NOTE — Progress Notes (Signed)
Called patient to scheduled 6 month f/u and she states that she will call back to schedule when she has time.

## 2014-02-21 ENCOUNTER — Telehealth: Payer: Self-pay

## 2014-02-21 DIAGNOSIS — F411 Generalized anxiety disorder: Secondary | ICD-10-CM

## 2014-02-21 DIAGNOSIS — G4700 Insomnia, unspecified: Secondary | ICD-10-CM

## 2014-02-21 MED ORDER — DULOXETINE HCL 60 MG PO CPEP: 60.0000 mg | ORAL_CAPSULE | Freq: Every day | ORAL | Status: DC

## 2014-02-21 NOTE — Telephone Encounter (Signed)
° ° °  Dr Carlota Raspberry,  Patient has an appointment on 05/06/14.   She is requesting a refill on DULoxetine (CYMBALTA) 60 MG capsule   [facility] [facility]   [phone]

## 2014-02-21 NOTE — Telephone Encounter (Signed)
Rx sent to the pharmacy.

## 2014-03-27 ENCOUNTER — Telehealth: Payer: Self-pay

## 2014-03-27 NOTE — Telephone Encounter (Signed)
LM for rtn call. Does she need a form filled out or just a letter

## 2014-03-27 NOTE — Telephone Encounter (Signed)
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 PLEASE CALL PT AT [phone] AND THE FAX IS [phone] ATTN: Allyne Gee MD

## 2014-03-28 NOTE — Telephone Encounter (Signed)
A one sentence letter is fine  "Patient is taking cymbalta for anxiety".   Please call patient when letter has been faxed to [phone]  Attn:  Allyne Gee, MD.

## 2014-03-28 NOTE — Telephone Encounter (Signed)
Letter is written and faxed to number provided.  Copy mailed to pt.  Pt notified.

## 2014-05-06 ENCOUNTER — Encounter: Payer: Self-pay | Admitting: Family Medicine

## 2014-05-06 ENCOUNTER — Ambulatory Visit (INDEPENDENT_AMBULATORY_CARE_PROVIDER_SITE_OTHER): Payer: PRIVATE HEALTH INSURANCE | Admitting: Family Medicine

## 2014-05-06 VITALS — BP 151/81 | HR 79 | Temp 98.1°F | Resp 16 | Ht 62.75 in | Wt 170.2 lb

## 2014-05-06 DIAGNOSIS — G4700 Insomnia, unspecified: Secondary | ICD-10-CM

## 2014-05-06 DIAGNOSIS — M25552 Pain in left hip: Secondary | ICD-10-CM

## 2014-05-06 DIAGNOSIS — Z23 Encounter for immunization: Secondary | ICD-10-CM

## 2014-05-06 DIAGNOSIS — R5383 Other fatigue: Secondary | ICD-10-CM

## 2014-05-06 DIAGNOSIS — R238 Other skin changes: Secondary | ICD-10-CM

## 2014-05-06 DIAGNOSIS — R233 Spontaneous ecchymoses: Secondary | ICD-10-CM

## 2014-05-06 DIAGNOSIS — F411 Generalized anxiety disorder: Secondary | ICD-10-CM

## 2014-05-06 DIAGNOSIS — R635 Abnormal weight gain: Secondary | ICD-10-CM

## 2014-05-06 DIAGNOSIS — M25559 Pain in unspecified hip: Secondary | ICD-10-CM

## 2014-05-06 DIAGNOSIS — R5381 Other malaise: Secondary | ICD-10-CM

## 2014-05-06 DIAGNOSIS — Z0000 Encounter for general adult medical examination without abnormal findings: Secondary | ICD-10-CM

## 2014-05-06 LAB — CBC
HEMATOCRIT: 39.6 % (ref 36.0–46.0)
HEMOGLOBIN: 13.2 g/dL (ref 12.0–15.0)
MCH: 30.5 pg (ref 26.0–34.0)
MCHC: 33.3 g/dL (ref 30.0–36.0)
MCV: 91.5 fL (ref 78.0–100.0)
Platelets: 305 10*3/uL (ref 150–400)
RBC: 4.33 MIL/uL (ref 3.87–5.11)
RDW: 14.1 % (ref 11.5–15.5)
WBC: 6.7 10*3/uL (ref 4.0–10.5)

## 2014-05-06 MED ORDER — MELOXICAM 7.5 MG PO TABS: 7.5000 mg | ORAL_TABLET | Freq: Every day | ORAL | Status: DC

## 2014-05-06 MED ORDER — DULOXETINE HCL 30 MG PO CPEP: 30.0000 mg | ORAL_CAPSULE | Freq: Every day | ORAL | Status: DC

## 2014-05-06 MED ORDER — ZOSTER VACCINE LIVE 19400 UNT/0.65ML ~~LOC~~ SOLR: 0.6500 mL | Freq: Once | SUBCUTANEOUS | Status: DC

## 2014-05-06 MED ORDER — CLONAZEPAM 0.5 MG PO TABS: 0.5000 mg | ORAL_TABLET | Freq: Two times a day (BID) | ORAL | Status: DC | PRN

## 2014-05-06 NOTE — Patient Instructions (Addendum)
To look up more info on your condition, go to the website [email or web address], then on patient resources - select [facility]. Under patient resources, select  Insomnia. Follow up to discuss this and lower Cymbalta dose in next 6-8 weeks. Klonopin if needed.   Follow up with surgeon about soreness around implants.  Follow up in next 4-6 weeks to review some of the items we discussed today.   You should receive a call or letter about your lab results within the next week to 10 days.    Trochanteric Bursitis You have hip pain due to trochanteric bursitis. Bursitis means that the sack near the outside of the hip is filled with fluid and inflamed. This sack is made up of protective soft tissue. The pain from trochanteric bursitis can be severe and keep you from sleep. It can radiate to the buttocks or down the outside of the thigh to the knee. The pain is almost always worse when rising from the seated or lying position and with walking. Pain can improve after you take a few steps. It happens more often in people with hip joint and lumbar spine problems, such as arthritis or previous surgery. Very rarely the trochanteric bursa can become infected, and antibiotics and/or surgery may be needed. Treatment often includes an injection of local anesthetic mixed with cortisone medicine. This medicine is injected into the area where it is most tender over the hip. Repeat injections may be necessary if the response to treatment is slow. You can apply ice packs over the tender area for 30 minutes every 2 hours for the next few days. Anti-inflammatory and/or narcotic pain medicine may also be helpful. Limit your activity for the next few days if the pain continues. See your caregiver in 5-10 days if you are not greatly improved.  SEEK IMMEDIATE MEDICAL CARE IF:  You develop severe pain, fever, or increased redness.  You have pain that radiates below the knee. EXERCISES STRETCHING EXERCISES - Trochanteric Bursitis  These  exercises may help you when beginning to rehabilitate your injury. Your symptoms may resolve with or without further involvement from your physician, physical therapist, or athletic trainer. While completing these exercises, remember:   Restoring tissue flexibility helps normal motion to return to the joints. This allows healthier, less painful movement and activity.  An effective stretch should be held for at least 30 seconds.  A stretch should never be painful. You should only feel a gentle lengthening or release in the stretched tissue. STRETCH - Iliotibial Band  On the floor or bed, lie on your side so your injured leg is on top. Bend your knee and grab your ankle.  Slowly bring your knee back so that your thigh is in line with your trunk. Keep your heel at your buttocks and gently arch your back so your head, shoulders and hips line up.  Slowly lower your leg so that your knee approaches the floor/bed until you feel a gentle stretch on the outside of your thigh. If you do not feel a stretch and your knee will not fall farther, place the heel of your opposite foot on top of your knee and pull your thigh down farther.  Hold this stretch for __________ seconds.  Repeat __________ times. Complete this exercise __________ times per day. STRETCH - Hamstrings, Supine   Lie on your back. Loop a belt or towel over the ball of your foot as shown.  Straighten your knee and slowly pull on the belt to raise your injured  leg. Do not allow the knee to bend. Keep your opposite leg flat on the floor.  Raise the leg until you feel a gentle stretch behind your knee or thigh. Hold this position for __________ seconds.  Repeat __________ times. Complete this stretch __________ times per day. STRETCH - Quadriceps, Prone   Lie on your stomach on a firm surface, such as a bed or padded floor.  Bend your knee and grasp your ankle. If you are unable to reach your ankle or pant leg, use a belt around your  foot to lengthen your reach.  Gently pull your heel toward your buttocks. Your knee should not slide out to the side. You should feel a stretch in the front of your thigh and/or knee.  Hold this position for __________ seconds.  Repeat __________ times. Complete this stretch __________ times per day. STRETCHING - Hip Flexors, Lunge Half kneel with your knee on the floor and your opposite knee bent and directly over your ankle.  Keep good posture with your head over your shoulders. Tighten your buttocks to point your tailbone downward; this will prevent your back from arching too much.  You should feel a gentle stretch in the front of your thigh and/or hip. If you do not feel any resistance, slightly slide your opposite foot forward and then slowly lunge forward so your knee once again lines up over your ankle. Be sure your tailbone remains pointed downward.  Hold this stretch for __________ seconds.  Repeat __________ times. Complete this stretch __________ times per day. STRETCH - Adductors, Lunge  While standing, spread your legs.  Lean away from your injured leg by bending your opposite knee. You may rest your hands on your thigh for balance.  You should feel a stretch in your inner thigh. Hold for __________ seconds.  Repeat __________ times. Complete this exercise __________ times per day. Document Released: 09/09/2004 Document Revised: 12/17/2013 Document Reviewed: 11/14/2008 [facility] Patient Information 2015 [place], [place]. This information is not intended to replace advice given to you by your health care provider. Make sure you discuss any questions you have with your health care provider.    Insomnia Insomnia is frequent trouble falling and/or staying asleep. Insomnia can be a long term problem or a short term problem. Both are common. Insomnia can be a short term problem when the wakefulness is related to a certain stress or worry. Long term insomnia is often related to  ongoing stress during waking hours and/or poor sleeping habits. Overtime, sleep deprivation itself can make the problem worse. Every little thing feels more severe because you are overtired and your ability to cope is decreased. CAUSES   Stress, anxiety, and depression.  Poor sleeping habits.  Distractions such as TV in the bedroom.  Naps close to bedtime.  Engaging in emotionally charged conversations before bed.  Technical reading before sleep.  Alcohol and other sedatives. They may make the problem worse. They can hurt normal sleep patterns and normal dream activity.  Stimulants such as caffeine for several hours prior to bedtime.  Pain syndromes and shortness of breath can cause insomnia.  Exercise late at night.  Changing time zones may cause sleeping problems (jet lag). It is sometimes helpful to have someone observe your sleeping patterns. They should look for periods of not breathing during the night (sleep apnea). They should also look to see how long those periods last. If you live alone or observers are uncertain, you can also be observed at a sleep clinic  where your sleep patterns will be professionally monitored. Sleep apnea requires a checkup and treatment. Give your caregivers your medical history. Give your caregivers observations your family has made about your sleep.  SYMPTOMS   Not feeling rested in the morning.  Anxiety and restlessness at bedtime.  Difficulty falling and staying asleep. TREATMENT   Your caregiver may prescribe treatment for an underlying medical disorders. Your caregiver can give advice or help if you are using alcohol or other drugs for self-medication. Treatment of underlying problems will usually eliminate insomnia problems.  Medications can be prescribed for short time use. They are generally not recommended for lengthy use.  Over-the-counter sleep medicines are not recommended for lengthy use. They can be habit forming.  You can  promote easier sleeping by making lifestyle changes such as:  Using relaxation techniques that help with breathing and reduce muscle tension.  Exercising earlier in the day.  Changing your diet and the time of your last meal. No night time snacks.  Establish a regular time to go to bed.  Counseling can help with stressful problems and worry.  Soothing music and white noise may be helpful if there are background noises you cannot remove.  Stop tedious detailed work at least one hour before bedtime. HOME CARE INSTRUCTIONS   Keep a diary. Inform your caregiver about your progress. This includes any medication side effects. See your caregiver regularly. Take note of:  Times when you are asleep.  Times when you are awake during the night.  The quality of your sleep.  How you feel the next day. This information will help your caregiver care for you.  Get out of bed if you are still awake after 15 minutes. Read or do some quiet activity. Keep the lights down. Wait until you feel sleepy and go back to bed.  Keep regular sleeping and waking hours. Avoid naps.  Exercise regularly.  Avoid distractions at bedtime. Distractions include watching television or engaging in any intense or detailed activity like attempting to balance the household checkbook.  Develop a bedtime ritual. Keep a familiar routine of bathing, brushing your teeth, climbing into bed at the same time each night, listening to soothing music. Routines increase the success of falling to sleep faster.  Use relaxation techniques. This can be using breathing and muscle tension release routines. It can also include visualizing peaceful scenes. You can also help control troubling or intruding thoughts by keeping your mind occupied with boring or repetitive thoughts like the old concept of counting sheep. You can make it more creative like imagining planting one beautiful flower after another in your backyard garden.  During  your day, work to eliminate stress. When this is not possible use some of the previous suggestions to help reduce the anxiety that accompanies stressful situations. MAKE SURE YOU:   Understand these instructions.  Will watch your condition.  Will get help right away if you are not doing well or get worse. Document Released: 07/30/2000 Document Revised: 10/25/2011 Document Reviewed: 08/30/2007 [facility] Patient Information 2015 [place], [place]. This information is not intended to replace advice given to you by your health care provider. Make sure you discuss any questions you have with your health care provider.  Keeping You Healthy  Get These Tests  Blood Pressure- Have your blood pressure checked by your healthcare provider at least once a year.  Normal blood pressure is 120/80.  Weight- Have your body mass index (BMI) calculated to screen for obesity.  BMI is a  measure of body fat based on height and weight.  You can calculate your own BMI at GravelBags.it  Cholesterol- Have your cholesterol checked every year.  Diabetes- Have your blood sugar checked every year if you have high blood pressure, high cholesterol, a family history of diabetes or if you are overweight.  Pap Smear- Have a pap smear every 1 to 3 years if you have been sexually active.  If you are older than 65 and recent pap smears have been normal you may not need additional pap smears.  In addition, if you have had a hysterectomy  For benign disease additional pap smears are not necessary.  Mammogram-Yearly mammograms are essential for early detection of breast cancer  Screening for Colon Cancer- Colonoscopy starting at age 50. Screening may begin sooner depending on your family history and other health conditions.  Follow up colonoscopy as directed by your Gastroenterologist.  Screening for Osteoporosis- Screening begins at age 99 with bone density scanning, sooner if you are at higher risk for developing  Osteoporosis.  Get these medicines  Calcium with Vitamin D- Your body requires 1200-1500 mg of Calcium a day and [phone] IU of Vitamin D a day.  You can only absorb 500 mg of Calcium at a time therefore Calcium must be taken in 2 or 3 separate doses throughout the day.  Hormones- Hormone therapy has been associated with increased risk for certain cancers and heart disease.  Talk to your healthcare provider about if you need relief from menopausal symptoms.  Aspirin- Ask your healthcare provider about taking Aspirin to prevent Heart Disease and Stroke.  Get these Immuniztions  Flu shot- Every fall  Pneumonia shot- Once after the age of 9; if you are younger ask your healthcare provider if you need a pneumonia shot.  Tetanus- Every ten years.  Zostavax- Once after the age of 49 to prevent shingles.  Take these steps  Don't smoke- Your healthcare provider can help you quit. For tips on how to quit, ask your healthcare provider or go to www.smokefree.gov or call 1-800 QUIT-NOW.  Be physically active- Exercise 5 days a week for a minimum of 30 minutes.  If you are not already physically active, start slow and gradually work up to 30 minutes of moderate physical activity.  Try walking, dancing, bike riding, swimming, etc.  Eat a healthy diet- Eat a variety of healthy foods such as fruits, vegetables, whole grains, low fat milk, low fat cheeses, yogurt, lean meats, chicken, fish, eggs, dried beans, tofu, etc.  For more information go to www.thenutritionsource.org  Dental visit- Brush and floss teeth twice daily; visit your dentist twice a year.  Eye exam- Visit your Optometrist or Ophthalmologist yearly.  Drink alcohol in moderation- Limit alcohol intake to one drink or less a day.  Never drink and drive.  Depression- Your emotional health is as important as your physical health.  If you're feeling down or losing interest in things you normally enjoy, please talk to your healthcare  provider.  Seat Belts- can save your life; always wear one  Smoke/Carbon Monoxide detectors- These detectors need to be installed on the appropriate level of your home.  Replace batteries at least once a year.  Violence- If anyone is threatening or hurting you, please tell your healthcare provider.  Living Will/ Health care power of attorney- Discuss with your healthcare provider and family.  Shingles Vaccine What You Need to Know WHAT IS SHINGLES?  Shingles is a painful skin rash, often with blisters.  It is also called Herpes Zoster or just Zoster.  A shingles rash usually appears on one side of the face or body and lasts from 2 to 4 weeks. Its main symptom is pain, which can be quite severe. Other symptoms of shingles can include fever, headache, chills, and upset stomach. Very rarely, a shingles infection can lead to pneumonia, hearing problems, blindness, brain inflammation (encephalitis), or death.  For about 1 person in 5, severe pain can continue even after the rash clears up. This is called post-herpetic neuralgia.  Shingles is caused by the Varicella Zoster virus. This is the same virus that causes chickenpox. Only someone who has had a case of chickenpox or rarely, has gotten chickenpox vaccine, can get shingles. The virus stays in your body. It can reappear many years later to cause a case of shingles.  You cannot catch shingles from another person with shingles. However, a person who has never had chickenpox (or chickenpox vaccine) could get chickenpox from someone with shingles. This is not very common.  Shingles is far more common in people 66 and older than in younger people. It is also more common in people whose immune systems are weakened because of a disease such as cancer or drugs such as steroids or chemotherapy.  At least 1 million people get shingles per year in the [place]. SHINGLES VACCINE  A vaccine for shingles was licensed in 0347. In clinical trials,  the vaccine reduced the risk of shingles by 50%. It can also reduce the pain in people who still get shingles after being vaccinated.  A single dose of shingles vaccine is recommended for adults 26 years of age and older. SOME PEOPLE SHOULD NOT GET SHINGLES VACCINE OR SHOULD WAIT A person should not get shingles vaccine if he or she:  Has ever had a life-threatening allergic reaction to gelatin, the antibiotic neomycin, or any other component of shingles vaccine. Tell your caregiver if you have any severe allergies.  Has a weakened immune system because of current:  AIDS or another disease that affects the immune system.  Treatment with drugs that affect the immune system, such as prolonged use of high-dose steroids.  Cancer treatment, such as radiation or chemotherapy.  Cancer affecting the bone marrow or lymphatic system, such as leukemia or lymphoma.  Is pregnant, or might be pregnant. Women should not become pregnant until at least 4 weeks after getting shingles vaccine. Someone with a minor illness, such as a cold, may be vaccinated. Anyone with a moderate or severe acute illness should usually wait until he or she recovers before getting the vaccine. This includes anyone with a temperature of 101.3 F (38 C) or higher. WHAT ARE THE RISKS FROM SHINGLES VACCINE?  A vaccine, like any medicine, could possibly cause serious problems, such as severe allergic reactions. However, the risk of a vaccine causing serious harm, or death, is extremely small.  No serious problems have been identified with shingles vaccine. Mild Problems  Redness, soreness, swelling, or itching at the site of the injection (about 1 person in 3).  Headache (about 1 person in 5). Like all vaccines, shingles vaccine is being closely monitored for unusual or severe problems. WHAT IF THERE IS A MODERATE OR SEVERE REACTION? What should I look for? Any unusual condition, such as a severe allergic reaction or a high  fever. If a severe allergic reaction occurred, it would be within a few minutes to an hour after the shot. Signs of a serious allergic  reaction can include difficulty breathing, weakness, hoarseness or wheezing, a fast heartbeat, hives, dizziness, paleness, or swelling of the throat. What should I do?  Call your caregiver, or get the person to a caregiver right away.  Tell the caregiver what happened, the date and time it happened, and when the vaccination was given.  Ask the caregiver to report the reaction by filing a Vaccine Adverse Event Reporting System (VAERS) form. Or, you can file this report through the VAERS web site at www.vaers.SamedayNews.es or by calling 504-253-3024. VAERS does not provide medical advice. HOW CAN I LEARN MORE?  Ask your caregiver. He or she can give you the vaccine package insert or suggest other sources of information.  Contact the Centers for Disease Control and Prevention (CDC):  Call 713-320-8753 (1-800-CDC-INFO).  Visit the CDC website at http://hunter.com/ CDC Shingles Vaccine VIS (05/21/08) Document Released: 05/30/2006 Document Revised: 10/25/2011 Document Reviewed: 11/22/2012 [facility] Patient Information 2015 [place]. This information is not intended to replace advice given to you by your health care provider. Make sure you discuss any questions you have with your health care provider.

## 2014-05-06 NOTE — Progress Notes (Signed)
   Subjective:    Patient ID: [name], female    DOB: 1951/05/29, 63 y.o.   MRN: [id number]  HPI    Review of Systems  Constitutional: Positive for fatigue.  HENT: Positive for ear pain and postnasal drip.   Eyes: Negative.   Respiratory: Negative.   Cardiovascular: Negative.   Gastrointestinal: Negative.   Endocrine: Positive for heat intolerance and polyphagia.  Genitourinary: Positive for decreased urine volume.  Musculoskeletal: Positive for arthralgias.  Skin: Negative.   Allergic/Immunologic: Positive for environmental allergies and food allergies.  Neurological: Positive for numbness.  Hematological: Bruises/bleeds easily.  Psychiatric/Behavioral: Positive for sleep disturbance.       Objective:   Physical Exam       Assessment & Plan:

## 2014-05-06 NOTE — Progress Notes (Addendum)
Subjective:    Patient ID: [name], female    DOB: [birth year], 63 y.o.   MRN: [id number] This chart was scribed for Ellen Agreste, MD by Cathie Hoops, ED Scribe. The patient was seen in Room 23 The patient's care was started at 5:18 PM.   05/06/2014  Annual Exam   HPI HPI Comments: [name] is a 63 y.o. female who presents to the [facility] for annual exam and multiple concerns.  1.) Left Hip Pain Pt notes she has left hip pain onset 2 months ago. had Pt denies injury or fall. She notes her pain is worsened with movement and ambulation. Pt denies groin pain or weakness in the left leg. She has taken OTC ibuprofen with minimal relief at night. She has a history of inflammation, bursitis and a chipped bone on her left hip. She thinks her symptoms may be attributed   2.) Weight Gain Pt has some weight gain in her abdominal region. Pt denies having a regular exercise regimen.   3.) Cymbalta Pt would like to be taken off the medication as she sees no improvement. Pt believes her weight gain may be connected to her weight gain. She denies she is currently depressed or sad. She states does have ups and downs in life but notes she feels like she can handle it without medication. Pt denies using Valium regularly at night, she last used it last week. Pt falls asleep quite easily but states it is hard for her to stay asleep. Benadryl wires her.   4.) Heat Intolerance She states the past summer has been intolerable. She gets extremely overwhelmed with She attributes her heat intolerance to her implants.   She has some numbness in her forearms and hands bilaterally due to a pinched disc with has remained stable.  Last physical: Pap smear: April 2015 with normal findings with Dr. Rivka Barbara Mammogram: Pt has had a double mastectomy. Pt denies lumps, bumps, or areas of concern. She notes she has some pain and discomfort from her implants. She notes aching pain  underneath her breasts and stabbing pains on the sides of the breasts. She states she had costochondritis. Pt denies chest pain or chest heaviness. Colonoscopy: ~July 2006 with a 10 year follow-up Zostavax: N/A but would like to receive more information  Influenza: Plan to receive at work.  Review of Systems  Constitutional: Negative for fatigue and unexpected weight change.  HENT: Positive for ear pain.   Respiratory: Negative for chest tightness and shortness of breath.   Cardiovascular: Negative for chest pain, palpitations and leg swelling.  Gastrointestinal: Negative for abdominal pain and blood in stool.  Genitourinary: Positive for decreased urine volume.  Neurological: Negative for dizziness, syncope, light-headedness and headaches.  Hematological: Bruises/bleeds easily.  All other systems reviewed and are negative.  Most of these have been chronic but has some specific concerns today are weight gain, left hip pain and decreasing from Cymbalta.  Patient Active Problem List   Diagnosis Date Noted  . Cough 01/02/2013  . Anxiety state, unspecified 01/02/2013  . Breast cancer (DCIS), stage 0, Left, receptor +, dx 2012 09/08/2010   Past Medical History  Diagnosis Date  . Asthma   . Rectal bleeding     hemorroid  . Hiatal hernia   . Kidney stone   . Sinus drainage   . Contact lens/glasses fitting   . Bruises easily   . Arthritis   . Anxiety   .  Allergy   . Cancer     double mastectomy  . Osteoporosis   . Heart murmur   . Depression    Past Surgical History  Procedure Laterality Date  . Breast lumpectomy w/ needle localization  10/05/2010    lumpectomy  . Ovarian cyst surgery  1997, 2001, 2008  . Mastectomy w/ sentinel node biopsy  01/26/2011  . Simple mastectomy  01/26/2011  . Appendectomy    . Breast surgery     Allergies  Allergen Reactions  . Elavil [Amitriptyline Hcl] Rash  . Sulfur Rash   Prior to Admission medications   Medication Sig Start Date End  Date Taking? Authorizing Provider  albuterol (PROVENTIL HFA;VENTOLIN HFA) 108 (90 BASE) MCG/ACT inhaler Inhale 2 puffs into the lungs every 6 (six) hours as needed for wheezing or shortness of breath. 01/02/13   Gregor Hams, MD  calcium-vitamin D (OSCAL WITH D) 500-200 MG-UNIT per tablet Take 1 tablet by mouth daily.    Historical Provider, MD  cetirizine (ZYRTEC) 10 MG tablet Take 10 mg by mouth daily as needed for allergies.     Historical Provider, MD  cholecalciferol (VITAMIN D) 1000 UNITS tablet Take 1,000 Units by mouth daily.    Historical Provider, MD  diazepam (VALIUM) 5 MG tablet Take 1 tablet (5 mg total) by mouth every 12 (twelve) hours as needed for anxiety. 08/14/13   Ellen Agreste, MD  DULoxetine (CYMBALTA) 60 MG capsule Take 1 capsule (60 mg total) by mouth daily. 02/21/14   Mancel Bale, PA-C  fluticasone (FLONASE) 50 MCG/ACT nasal spray USE 2 SPRAYS IN EACH NOSTRIL EVERY DAY AS DIRECTED 07/13/13   Mancel Bale, PA-C  guaiFENesin-codeine (ROBITUSSIN AC) 100-10 MG/5ML syrup Take 5 mLs by mouth 3 (three) times daily as needed for cough. 01/02/13   Gregor Hams, MD  HYDROcodone-acetaminophen (NORCO/VICODIN) 5-325 MG per tablet Take 1 tablet by mouth every 6 (six) hours as needed for pain. 05/13/13   Roselee Culver, MD  magnesium oxide (MAG-OX) 400 MG tablet Take 400 mg by mouth daily.    Historical Provider, MD  montelukast (SINGULAIR) 10 MG tablet Take 10 mg by mouth daily as needed (for asthma flares).     Historical Provider, MD  Multiple Vitamin (MULTIVITAMIN WITH MINERALS) TABS Take 1 tablet by mouth daily.    Historical Provider, MD  ondansetron (ZOFRAN ODT) 8 MG disintegrating tablet Take 1 tablet (8 mg total) by mouth every 8 (eight) hours as needed for nausea. 12/09/12   Tatyana A Kirichenko, PA-C  OVER THE COUNTER MEDICATION Belly trim xp    Historical Provider, MD  OVER THE COUNTER MEDICATION Omega krill 5x    Historical Provider, MD  OVER THE COUNTER MEDICATION Absorb  max    Historical Provider, MD  oxyCODONE-acetaminophen (PERCOCET) 5-325 MG per tablet Take 1 tablet by mouth every 4 (four) hours as needed for pain. 12/09/12   Tatyana A Kirichenko, PA-C  predniSONE (DELTASONE) 50 MG tablet Take 1 tablet (50 mg total) by mouth daily. 01/02/13   Gregor Hams, MD  tamsulosin (FLOMAX) 0.4 MG CAPS Take 1 capsule (0.4 mg total) by mouth daily. 12/09/12   Tatyana A Kirichenko, PA-C   History   Social History  . Marital Status: Married    Spouse Name: N/A    Number of Children: N/A  . Years of Education: N/A   Occupational History  . Not on file.   Social History Main Topics  . Smoking status: Never  Smoker   . Smokeless tobacco: Not on file  . Alcohol Use: Yes     Comment: occasional  . Drug Use: No  . Sexual Activity: No   Other Topics Concern  . Not on file   Social History Narrative  . No narrative on file    Objective:  Physical Exam  Vitals reviewed. Constitutional: She is oriented to person, place, and time. She appears well-developed and well-nourished.  HENT:  Head: Normocephalic and atraumatic.  Eyes: Conjunctivae and EOM are normal. Pupils are equal, round, and reactive to light.  Neck: Carotid bruit is not present.  Cardiovascular: Normal rate, regular rhythm, normal heart sounds and intact distal pulses.  Exam reveals no gallop and no friction rub.   No murmur heard. Pulmonary/Chest: Effort normal and breath sounds normal.  Abdominal: Soft. She exhibits no pulsatile midline mass. There is no tenderness.  Musculoskeletal:  Slight tenderness of her left trochanteric bursa. Tender over the siaitic notch and piriformis muscle of left leg. L-S spine is non-tender and FROM. No pain with internal or external motion of left hip. Unable to reproduce pain with piriformis stretch.  Neurological: She is alert and oriented to person, place, and time.  Reflex Scores:      Patellar reflexes are 2+ on the right side and 2+ on the left side.       Achilles reflexes are 2+ on the right side and 2+ on the left side. Skin: Skin is warm and dry.  Psychiatric: She has a normal mood and affect. Her behavior is normal.    Filed Vitals:   05/06/14 1602  BP: 151/81  Pulse: 79  Temp: 98.1 F (36.7 C)  TempSrc: Oral  Resp: 16  Height: 5' 2.75" (1.594 m)  Weight: 170 lb 3.2 oz (77.202 kg)  SpO2: 99%  Patient's last menstrual period was 09/10/2006.   Assessment & Plan:  5:49 PM- Patient informed of current plan for treatment and evaluation and agrees with plan at this time.  SAMREEN SELTZER is a 63 y.o. female Annual physical exam  --anticipatory guidance as below in AVS, screening labs above. Health maintenance items as above in HPI discussed/recommended as applicable.   -s/p mastectomy, and has pap testing through obgyn.   Anxiety state, unspecified - Plan: DULoxetine (CYMBALTA) 30 MG capsule, clonazePAM (KLONOPIN) 0.5 MG tablet  -requested wean form this medication, but situational anxiety with depression prior. Can try lower dose at 30mg , then recheck to determine if further wean tolerated and episodic use of klonopin if needed - changed form Valium.   Insomnia - Plan: DULoxetine (CYMBALTA) 30 MG capsule, clonazePAM (KLONOPIN) 0.5 MG tablet  -sleep hygiene from Upto date, klopin of needed and discuss further at next ov.   Need for shingles vaccine - Plan: zoster vaccine live, PF, (ZOSTAVAX) 36644 UNT/0.65ML injection  -rx printed - recommded, but can read more on this from [facility] and handout below.   Left hip pain - Plan: meloxicam (MOBIC) 7.5 MG tablet  -combo of troch bursitis and possible sciatic or lower lumbar source. Trial of mobic and recheck to further eval with possible xrays.   Easy bruising - Plan: TSH, COMPLETE METABOLIC PANEL WITH GFR, Lipid panel  -CBC pending.   Weight gain - Plan: TSH, COMPLETE METABOLIC PANEL WITH GFR, Lipid panel  - 5 pounds form last eval. Will check tsh, decreased dose of cymbalta - but less  likely.  Exercise encouraged.   Other malaise and fatigue - Plan: CBC, TSH, COMPLETE  METABOLIC PANEL WITH GFR, Lipid panel  Labs pending.  rtc for further discussion after labwork  Chest wall/implant pain - follow up with surgeon to discuss.   Meds ordered this encounter  Medications  . DULoxetine (CYMBALTA) 30 MG capsule    Sig: Take 1 capsule (30 mg total) by mouth daily.    Dispense:  30 capsule    Refill:  2  . clonazePAM (KLONOPIN) 0.5 MG tablet    Sig: Take 1 tablet (0.5 mg total) by mouth 2 (two) times daily as needed for anxiety.    Dispense:  20 tablet    Refill:  0  . zoster vaccine live, PF, (ZOSTAVAX) 09983 UNT/0.65ML injection    Sig: Inject 19,400 Units into the skin once.    Dispense:  1 each    Refill:  0  . meloxicam (MOBIC) 7.5 MG tablet    Sig: Take 1 tablet (7.5 mg total) by mouth daily.    Dispense:  30 tablet    Refill:  0    There are other unrelated non-urgent complaints as in ROS, but due to the busy schedule and the amount of time I've already spent with her, time does not permit me to address these routine issues at today's visit, most of which are stable by her report. I've requested another appointment to review these additional issues.  Patient Instructions  To look up more info on your condition, go to the website [email or web address], then on patient resources - select [facility]. Under patient resources, select  Insomnia. Follow up to discuss this and lower Cymbalta dose in next 6-8 weeks. Klonopin if needed.   Follow up with surgeon about soreness around implants.  Follow up in next 4-6 weeks to review some of the items we discussed today.   You should receive a call or letter about your lab results within the next week to 10 days.    Trochanteric Bursitis You have hip pain due to trochanteric bursitis. Bursitis means that the sack near the outside of the hip is filled with fluid and inflamed. This sack is made up of protective soft tissue. The pain  from trochanteric bursitis can be severe and keep you from sleep. It can radiate to the buttocks or down the outside of the thigh to the knee. The pain is almost always worse when rising from the seated or lying position and with walking. Pain can improve after you take a few steps. It happens more often in people with hip joint and lumbar spine problems, such as arthritis or previous surgery. Very rarely the trochanteric bursa can become infected, and antibiotics and/or surgery may be needed. Treatment often includes an injection of local anesthetic mixed with cortisone medicine. This medicine is injected into the area where it is most tender over the hip. Repeat injections may be necessary if the response to treatment is slow. You can apply ice packs over the tender area for 30 minutes every 2 hours for the next few days. Anti-inflammatory and/or narcotic pain medicine may also be helpful. Limit your activity for the next few days if the pain continues. See your caregiver in 5-10 days if you are not greatly improved.  SEEK IMMEDIATE MEDICAL CARE IF:  You develop severe pain, fever, or increased redness.  You have pain that radiates below the knee. EXERCISES STRETCHING EXERCISES - Trochanteric Bursitis  These exercises may help you when beginning to rehabilitate your injury. Your symptoms may resolve with or without further involvement from your physician,  physical therapist, or athletic trainer. While completing these exercises, remember:   Restoring tissue flexibility helps normal motion to return to the joints. This allows healthier, less painful movement and activity.  An effective stretch should be held for at least 30 seconds.  A stretch should never be painful. You should only feel a gentle lengthening or release in the stretched tissue. STRETCH - Iliotibial Band  On the floor or bed, lie on your side so your injured leg is on top. Bend your knee and grab your ankle.  Slowly bring your  knee back so that your thigh is in line with your trunk. Keep your heel at your buttocks and gently arch your back so your head, shoulders and hips line up.  Slowly lower your leg so that your knee approaches the floor/bed until you feel a gentle stretch on the outside of your thigh. If you do not feel a stretch and your knee will not fall farther, place the heel of your opposite foot on top of your knee and pull your thigh down farther.  Hold this stretch for __________ seconds.  Repeat __________ times. Complete this exercise __________ times per day. STRETCH - Hamstrings, Supine   Lie on your back. Loop a belt or towel over the ball of your foot as shown.  Straighten your knee and slowly pull on the belt to raise your injured leg. Do not allow the knee to bend. Keep your opposite leg flat on the floor.  Raise the leg until you feel a gentle stretch behind your knee or thigh. Hold this position for __________ seconds.  Repeat __________ times. Complete this stretch __________ times per day. STRETCH - Quadriceps, Prone   Lie on your stomach on a firm surface, such as a bed or padded floor.  Bend your knee and grasp your ankle. If you are unable to reach your ankle or pant leg, use a belt around your foot to lengthen your reach.  Gently pull your heel toward your buttocks. Your knee should not slide out to the side. You should feel a stretch in the front of your thigh and/or knee.  Hold this position for __________ seconds.  Repeat __________ times. Complete this stretch __________ times per day. STRETCHING - Hip Flexors, Lunge Half kneel with your knee on the floor and your opposite knee bent and directly over your ankle.  Keep good posture with your head over your shoulders. Tighten your buttocks to point your tailbone downward; this will prevent your back from arching too much.  You should feel a gentle stretch in the front of your thigh and/or hip. If you do not feel any  resistance, slightly slide your opposite foot forward and then slowly lunge forward so your knee once again lines up over your ankle. Be sure your tailbone remains pointed downward.  Hold this stretch for __________ seconds.  Repeat __________ times. Complete this stretch __________ times per day. STRETCH - Adductors, Lunge  While standing, spread your legs.  Lean away from your injured leg by bending your opposite knee. You may rest your hands on your thigh for balance.  You should feel a stretch in your inner thigh. Hold for __________ seconds.  Repeat __________ times. Complete this exercise __________ times per day. Document Released: 09/09/2004 Document Revised: 12/17/2013 Document Reviewed: 11/14/2008 [facility] Patient Information 2015 [place], [place]. This information is not intended to replace advice given to you by your health care provider. Make sure you discuss any questions you have with  your health care provider.    Insomnia Insomnia is frequent trouble falling and/or staying asleep. Insomnia can be a long term problem or a short term problem. Both are common. Insomnia can be a short term problem when the wakefulness is related to a certain stress or worry. Long term insomnia is often related to ongoing stress during waking hours and/or poor sleeping habits. Overtime, sleep deprivation itself can make the problem worse. Every little thing feels more severe because you are overtired and your ability to cope is decreased. CAUSES   Stress, anxiety, and depression.  Poor sleeping habits.  Distractions such as TV in the bedroom.  Naps close to bedtime.  Engaging in emotionally charged conversations before bed.  Technical reading before sleep.  Alcohol and other sedatives. They may make the problem worse. They can hurt normal sleep patterns and normal dream activity.  Stimulants such as caffeine for several hours prior to bedtime.  Pain syndromes and shortness of breath  can cause insomnia.  Exercise late at night.  Changing time zones may cause sleeping problems (jet lag). It is sometimes helpful to have someone observe your sleeping patterns. They should look for periods of not breathing during the night (sleep apnea). They should also look to see how long those periods last. If you live alone or observers are uncertain, you can also be observed at a sleep clinic where your sleep patterns will be professionally monitored. Sleep apnea requires a checkup and treatment. Give your caregivers your medical history. Give your caregivers observations your family has made about your sleep.  SYMPTOMS   Not feeling rested in the morning.  Anxiety and restlessness at bedtime.  Difficulty falling and staying asleep. TREATMENT   Your caregiver may prescribe treatment for an underlying medical disorders. Your caregiver can give advice or help if you are using alcohol or other drugs for self-medication. Treatment of underlying problems will usually eliminate insomnia problems.  Medications can be prescribed for short time use. They are generally not recommended for lengthy use.  Over-the-counter sleep medicines are not recommended for lengthy use. They can be habit forming.  You can promote easier sleeping by making lifestyle changes such as:  Using relaxation techniques that help with breathing and reduce muscle tension.  Exercising earlier in the day.  Changing your diet and the time of your last meal. No night time snacks.  Establish a regular time to go to bed.  Counseling can help with stressful problems and worry.  Soothing music and white noise may be helpful if there are background noises you cannot remove.  Stop tedious detailed work at least one hour before bedtime. HOME CARE INSTRUCTIONS   Keep a diary. Inform your caregiver about your progress. This includes any medication side effects. See your caregiver regularly. Take note of:  Times when you  are asleep.  Times when you are awake during the night.  The quality of your sleep.  How you feel the next day. This information will help your caregiver care for you.  Get out of bed if you are still awake after 15 minutes. Read or do some quiet activity. Keep the lights down. Wait until you feel sleepy and go back to bed.  Keep regular sleeping and waking hours. Avoid naps.  Exercise regularly.  Avoid distractions at bedtime. Distractions include watching television or engaging in any intense or detailed activity like attempting to balance the household checkbook.  Develop a bedtime ritual. Keep a familiar routine of bathing, brushing  your teeth, climbing into bed at the same time each night, listening to soothing music. Routines increase the success of falling to sleep faster.  Use relaxation techniques. This can be using breathing and muscle tension release routines. It can also include visualizing peaceful scenes. You can also help control troubling or intruding thoughts by keeping your mind occupied with boring or repetitive thoughts like the old concept of counting sheep. You can make it more creative like imagining planting one beautiful flower after another in your backyard garden.  During your day, work to eliminate stress. When this is not possible use some of the previous suggestions to help reduce the anxiety that accompanies stressful situations. MAKE SURE YOU:   Understand these instructions.  Will watch your condition.  Will get help right away if you are not doing well or get worse. Document Released: 07/30/2000 Document Revised: 10/25/2011 Document Reviewed: 08/30/2007 [facility] Patient Information 2015 [place], [place]. This information is not intended to replace advice given to you by your health care provider. Make sure you discuss any questions you have with your health care provider.  Keeping You Healthy  Get These Tests  Blood Pressure- Have your blood  pressure checked by your healthcare provider at least once a year.  Normal blood pressure is 120/80.  Weight- Have your body mass index (BMI) calculated to screen for obesity.  BMI is a measure of body fat based on height and weight.  You can calculate your own BMI at [employer].it  Cholesterol- Have your cholesterol checked every year.  Diabetes- Have your blood sugar checked every year if you have high blood pressure, high cholesterol, a family history of diabetes or if you are overweight.  Pap Smear- Have a pap smear every 1 to 3 years if you have been sexually active.  If you are older than 65 and recent pap smears have been normal you may not need additional pap smears.  In addition, if you have had a hysterectomy  For benign disease additional pap smears are not necessary.  Mammogram-Yearly mammograms are essential for early detection of breast cancer  Screening for Colon Cancer- Colonoscopy starting at age 94. Screening may begin sooner depending on your family history and other health conditions.  Follow up colonoscopy as directed by your Gastroenterologist.  Screening for Osteoporosis- Screening begins at age 69 with bone density scanning, sooner if you are at higher risk for developing Osteoporosis.  Get these medicines  Calcium with Vitamin D- Your body requires 1200-1500 mg of Calcium a day and [phone] IU of Vitamin D a day.  You can only absorb 500 mg of Calcium at a time therefore Calcium must be taken in 2 or 3 separate doses throughout the day.  Hormones- Hormone therapy has been associated with increased risk for certain cancers and heart disease.  Talk to your healthcare provider about if you need relief from menopausal symptoms.  Aspirin- Ask your healthcare provider about taking Aspirin to prevent Heart Disease and Stroke.  Get these Immuniztions  Flu shot- Every fall  Pneumonia shot- Once after the age of 82; if you are younger ask your healthcare  provider if you need a pneumonia shot.  Tetanus- Every ten years.  Zostavax- Once after the age of 59 to prevent shingles.  Take these steps  Don't smoke- Your healthcare provider can help you quit. For tips on how to quit, ask your healthcare provider or go to www.smokefree.gov or call 1-800 QUIT-NOW.  Be physically active- Exercise 5 days  a week for a minimum of 30 minutes.  If you are not already physically active, start slow and gradually work up to 30 minutes of moderate physical activity.  Try walking, dancing, bike riding, swimming, etc.  Eat a healthy diet- Eat a variety of healthy foods such as fruits, vegetables, whole grains, low fat milk, low fat cheeses, yogurt, lean meats, chicken, fish, eggs, dried beans, tofu, etc.  For more information go to www.thenutritionsource.org  Dental visit- Brush and floss teeth twice daily; visit your dentist twice a year.  Eye exam- Visit your Optometrist or Ophthalmologist yearly.  Drink alcohol in moderation- Limit alcohol intake to one drink or less a day.  Never drink and drive.  Depression- Your emotional health is as important as your physical health.  If you're feeling down or losing interest in things you normally enjoy, please talk to your healthcare provider.  Seat Belts- can save your life; always wear one  Smoke/Carbon Monoxide detectors- These detectors need to be installed on the appropriate level of your home.  Replace batteries at least once a year.  Violence- If anyone is threatening or hurting you, please tell your healthcare provider.  Living Will/ Health care power of attorney- Discuss with your healthcare provider and family.  Shingles Vaccine What You Need to Know WHAT IS SHINGLES?  Shingles is a painful skin rash, often with blisters. It is also called Herpes Zoster or just Zoster.  A shingles rash usually appears on one side of the face or body and lasts from 2 to 4 weeks. Its main symptom is pain, which can be  quite severe. Other symptoms of shingles can include fever, headache, chills, and upset stomach. Very rarely, a shingles infection can lead to pneumonia, hearing problems, blindness, brain inflammation (encephalitis), or death.  For about 1 person in 5, severe pain can continue even after the rash clears up. This is called post-herpetic neuralgia.  Shingles is caused by the Varicella Zoster virus. This is the same virus that causes chickenpox. Only someone who has had a case of chickenpox or rarely, has gotten chickenpox vaccine, can get shingles. The virus stays in your body. It can reappear many years later to cause a case of shingles.  You cannot catch shingles from another person with shingles. However, a person who has never had chickenpox (or chickenpox vaccine) could get chickenpox from someone with shingles. This is not very common.  Shingles is far more common in people 7 and older than in younger people. It is also more common in people whose immune systems are weakened because of a disease such as cancer or drugs such as steroids or chemotherapy.  At least 1 million people get shingles per year in the [place]. SHINGLES VACCINE  A vaccine for shingles was licensed in 1287. In clinical trials, the vaccine reduced the risk of shingles by 50%. It can also reduce the pain in people who still get shingles after being vaccinated.  A single dose of shingles vaccine is recommended for adults 75 years of age and older. SOME PEOPLE SHOULD NOT GET SHINGLES VACCINE OR SHOULD WAIT A person should not get shingles vaccine if he or she:  Has ever had a life-threatening allergic reaction to gelatin, the antibiotic neomycin, or any other component of shingles vaccine. Tell your caregiver if you have any severe allergies.  Has a weakened immune system because of current:  AIDS or another disease that affects the immune system.  Treatment with drugs that affect  the immune system, such as  prolonged use of high-dose steroids.  Cancer treatment, such as radiation or chemotherapy.  Cancer affecting the bone marrow or lymphatic system, such as leukemia or lymphoma.  Is pregnant, or might be pregnant. Women should not become pregnant until at least 4 weeks after getting shingles vaccine. Someone with a minor illness, such as a cold, may be vaccinated. Anyone with a moderate or severe acute illness should usually wait until he or she recovers before getting the vaccine. This includes anyone with a temperature of 101.3 F (38 C) or higher. WHAT ARE THE RISKS FROM SHINGLES VACCINE?  A vaccine, like any medicine, could possibly cause serious problems, such as severe allergic reactions. However, the risk of a vaccine causing serious harm, or death, is extremely small.  No serious problems have been identified with shingles vaccine. Mild Problems  Redness, soreness, swelling, or itching at the site of the injection (about 1 person in 3).  Headache (about 1 person in 39). Like all vaccines, shingles vaccine is being closely monitored for unusual or severe problems. WHAT IF THERE IS A MODERATE OR SEVERE REACTION? What should I look for? Any unusual condition, such as a severe allergic reaction or a high fever. If a severe allergic reaction occurred, it would be within a few minutes to an hour after the shot. Signs of a serious allergic reaction can include difficulty breathing, weakness, hoarseness or wheezing, a fast heartbeat, hives, dizziness, paleness, or swelling of the throat. What should I do?  Call your caregiver, or get the person to a caregiver right away.  Tell the caregiver what happened, the date and time it happened, and when the vaccination was given.  Ask the caregiver to report the reaction by filing a Vaccine Adverse Event Reporting System (VAERS) form. Or, you can file this report through the VAERS web site at www.vaers.SamedayNews.es or by calling (720)803-1725. VAERS  does not provide medical advice. HOW CAN I LEARN MORE?  Ask your caregiver. He or she can give you the vaccine package insert or suggest other sources of information.  Contact the Centers for Disease Control and Prevention (CDC):  Call (705) 731-4388 (1-800-CDC-INFO).  Visit the CDC website at http://hunter.com/ CDC Shingles Vaccine VIS (05/21/08) Document Released: 05/30/2006 Document Revised: 10/25/2011 Document Reviewed: 11/22/2012 [facility] Patient Information 2015 [place]. This information is not intended to replace advice given to you by your health care provider. Make sure you discuss any questions you have with your health care provider.     I personally performed the services described in this documentation, which was scribed in my presence. The recorded information has been reviewed and considered, and addended by me as needed.

## 2014-05-07 LAB — LIPID PANEL
Cholesterol: 232 mg/dL — ABNORMAL HIGH (ref 0–200)
HDL: 40 mg/dL (ref >39)
LDL CALC: 139 mg/dL — AB (ref 0–99)
TRIGLYCERIDES: 265 mg/dL — AB (ref <150)
Total CHOL/HDL Ratio: 5.8 Ratio
VLDL: 53 mg/dL — ABNORMAL HIGH (ref 0–40)

## 2014-05-07 LAB — COMPLETE METABOLIC PANEL WITH GFR
ALT: 18 U/L (ref 0–35)
AST: 17 U/L (ref 0–37)
Albumin: 4.4 g/dL (ref 3.5–5.2)
Alkaline Phosphatase: 140 U/L — ABNORMAL HIGH (ref 39–117)
BUN: 11 mg/dL (ref 6–23)
CALCIUM: 9.4 mg/dL (ref 8.4–10.5)
CHLORIDE: 103 meq/L (ref 96–112)
CO2: 29 meq/L (ref 19–32)
CREATININE: 0.82 mg/dL (ref 0.50–1.10)
GFR, EST AFRICAN AMERICAN: 89 mL/min
GFR, EST NON AFRICAN AMERICAN: 77 mL/min
GLUCOSE: 91 mg/dL (ref 70–99)
Potassium: 4.2 mEq/L (ref 3.5–5.3)
Sodium: 140 mEq/L (ref 135–145)
Total Bilirubin: 0.6 mg/dL (ref 0.2–1.2)
Total Protein: 6.8 g/dL (ref 6.0–8.3)

## 2014-05-07 LAB — TSH: TSH: 1.38 u[IU]/mL (ref 0.350–4.500)

## 2014-08-17 ENCOUNTER — Other Ambulatory Visit: Payer: Self-pay | Admitting: Family Medicine

## 2014-09-22 ENCOUNTER — Ambulatory Visit (INDEPENDENT_AMBULATORY_CARE_PROVIDER_SITE_OTHER): Payer: PRIVATE HEALTH INSURANCE | Admitting: Family Medicine

## 2014-09-22 VITALS — BP 122/60 | HR 76 | Temp 98.0°F | Resp 16 | Ht 62.25 in | Wt 166.2 lb

## 2014-09-22 DIAGNOSIS — G4700 Insomnia, unspecified: Secondary | ICD-10-CM

## 2014-09-22 DIAGNOSIS — F418 Other specified anxiety disorders: Secondary | ICD-10-CM

## 2014-09-22 MED ORDER — CLONAZEPAM 0.5 MG PO TABS: 0.5000 mg | ORAL_TABLET | Freq: Two times a day (BID) | ORAL | Status: DC | PRN

## 2014-09-22 MED ORDER — DULOXETINE HCL 30 MG PO CPEP: 30.0000 mg | ORAL_CAPSULE | Freq: Every day | ORAL | Status: DC

## 2014-09-22 NOTE — Patient Instructions (Signed)
If you do decide to wean the cymbalta- can try to skip every third day for a week, then every other day for 1 week, then stop. Can always restart meds if new symptoms. Let me knwo if any questions.  Recheck in 6-7 months for physical. Return to the clinic or go to the nearest emergency room if any of your symptoms worsen or new symptoms occur.

## 2014-09-22 NOTE — Progress Notes (Signed)
Subjective:    Patient ID: [name], female    DOB: 05-25-1951, 64 y.o.   MRN: [id number] This chart was scribed for [name], MD by Marti Sleigh, Medical Scribe. This patient was seen in Room 2 and the patient's care was started a 5:05 PM.  Chief Complaint  Patient presents with  . Medication Refill    HPI HPI Comments: [name] is a 64 y.o. female who presents to [facility] reporting for a medication refill. Pt denies weight increases. Pt denies increased  irritability, anxiety, or depression sx. Pt denies SI, HI. Pt states she is happy in her life. Pt states she still has one klonopin left. Pt states she has occasional bouts of frustration. Pt states she is not seeing a counselor right now. Pt states she primarily takes Klonopin at night when she wants to have a good night of sleep.  Review from last HPI's: Pt has a hx of anxiety. Last seen by Dr. Melvyn Neth in Sept of 2015 and at that time pt wanted to stop the Cymbalta. Pt denied depression sx, and felt like she could handle ups and downs in life w/o medication. Pt was prescribed klonopin as needed, changed from valium, and agreed on trying a decreased done of Cymbalta  at 30mg  per day.    Patient Active Problem List   Diagnosis Date Noted  . Cough 01/02/2013  . Anxiety state, unspecified 01/02/2013  . Breast cancer (DCIS), stage 0, Left, receptor +, dx 2012 09/08/2010   Past Medical History  Diagnosis Date  . Asthma   . Rectal bleeding     hemorroid  . Hiatal hernia   . Kidney stone   . Sinus drainage   . Contact lens/glasses fitting   . Bruises easily   . Arthritis   . Anxiety   . Allergy   . Cancer     double mastectomy  . Osteoporosis   . Heart murmur   . Depression    Past Surgical History  Procedure Laterality Date  . Breast lumpectomy w/ needle localization  10/05/2010    lumpectomy  . Ovarian cyst surgery  1997, 2001, 2008  . Mastectomy w/ sentinel node biopsy  01/26/2011  . Simple mastectomy   01/26/2011  . Appendectomy    . Breast surgery     Allergies  Allergen Reactions  . Elavil [Amitriptyline Hcl] Rash  . Sulfur Rash   Prior to Admission medications   Medication Sig Start Date End Date Taking? Authorizing Provider  albuterol (PROVENTIL HFA;VENTOLIN HFA) 108 (90 BASE) MCG/ACT inhaler Inhale 2 puffs into the lungs every 6 (six) hours as needed for wheezing or shortness of breath. 01/02/13  Yes Gregor Hams, MD  calcium-vitamin D (OSCAL WITH D) 500-200 MG-UNIT per tablet Take 1 tablet by mouth daily.   Yes Historical Provider, MD  cetirizine (ZYRTEC) 10 MG tablet Take 10 mg by mouth daily as needed for allergies.    Yes Historical Provider, MD  clonazePAM (KLONOPIN) 0.5 MG tablet Take 1 tablet (0.5 mg total) by mouth 2 (two) times daily as needed for anxiety. 05/06/14  Yes [name], MD  DULoxetine (CYMBALTA) 30 MG capsule Take 1 capsule (30 mg total) by mouth daily. PATIENT NEEDS OFFICE VISIT FOR ADDITIONAL REFILLS - due for follow up 08/19/14  Yes [name], MD  fluticasone [facility]) 50 MCG/ACT nasal spray USE 2 SPRAYS IN EACH NOSTRIL EVERY DAY AS DIRECTED 07/13/13  Yes Mancel Bale, PA-C  zoster  vaccine live, PF, (ZOSTAVAX) 95093 UNT/0.65ML injection Inject 19,400 Units into the skin once. Patient not taking: Reported on 09/22/2014 05/06/14   [name], MD   History   Social History  . Marital Status: Married    Spouse Name: N/A    Number of Children: N/A  . Years of Education: N/A   Occupational History  . Not on file.   Social History Main Topics  . Smoking status: Never Smoker   . Smokeless tobacco: Not on file  . Alcohol Use: 0.0 oz/week    0 Not specified per week     Comment: occasional  . Drug Use: No  . Sexual Activity: No   Other Topics Concern  . Not on file   Social History Narrative    Review of Systems  Constitutional: Negative for fever and chills.  Skin: Negative for rash.  Neurological: Negative for headaches.    Psychiatric/Behavioral: Negative for suicidal ideas, self-injury and agitation. The patient is not nervous/anxious.        Objective:   Physical Exam  Constitutional: She is oriented to person, place, and time. She appears well-developed and well-nourished.  HENT:  Head: Normocephalic and atraumatic.  Eyes: Conjunctivae and EOM are normal. Pupils are equal, round, and reactive to light.  Neck: Neck supple. Carotid bruit is not present.  Cardiovascular: Normal rate, regular rhythm, normal heart sounds and intact distal pulses.   Pulmonary/Chest: Effort normal and breath sounds normal. No respiratory distress.  Abdominal: Soft. She exhibits no pulsatile midline mass. There is no tenderness.  Lymphadenopathy:    She has no cervical adenopathy.  Neurological: She is alert and oriented to person, place, and time.  Skin: Skin is warm and dry.  Psychiatric: She has a normal mood and affect. Her behavior is normal.  Nursing note and vitals reviewed.   Filed Vitals:   09/22/14 1605  BP: 122/60  Pulse: 76  Temp: 98 F (36.7 C)  TempSrc: Oral  Resp: 16  Height: 5' 2.25" (1.581 m)  Weight: 166 lb 4 oz (75.411 kg)  SpO2: 98%       Assessment & Plan:   [name] is a 64 y.o. female Situational anxiety - Plan: DULoxetine (CYMBALTA) 30 MG capsule, clonazePAM (KLONOPIN) 0.5 MG tablet  - prior depression/anxiety sx's that are stable.  Has tolerated lower dose of Cymbalta.  Has option to wean off in next few months if she desires, but can restart if sx's recur off Cymbalta.   - ok to use klonopin rarely if needed for breakthrough symptoms.   Insomnia - Plan: DULoxetine (CYMBALTA) 30 MG capsule, clonazePAM (KLONOPIN) 0.5 MG tablet  -klonopin if needed as above.   Meds ordered this encounter  Medications  . DULoxetine (CYMBALTA) 30 MG capsule    Sig: Take 1 capsule (30 mg total) by mouth daily.    Dispense:  90 capsule    Refill:  1  . clonazePAM (KLONOPIN) 0.5 MG tablet     Sig: Take 1 tablet (0.5 mg total) by mouth 2 (two) times daily as needed for anxiety.    Dispense:  30 tablet    Refill:  1   Patient Instructions  If you do decide to wean the cymbalta- can try to skip every third day for a week, then every other day for 1 week, then stop. Can always restart meds if new symptoms. Let me knwo if any questions.  Recheck in 6-7 months for physical. Return to the clinic or  go to the nearest emergency room if any of your symptoms worsen or new symptoms occur.     I personally performed the services described in this documentation, which was scribed in my presence. The recorded information has been reviewed and considered, and addended by me as needed.

## 2014-10-28 ENCOUNTER — Other Ambulatory Visit: Payer: Self-pay | Admitting: Physician Assistant

## 2014-10-28 DIAGNOSIS — J309 Allergic rhinitis, unspecified: Secondary | ICD-10-CM

## 2014-10-29 NOTE — Telephone Encounter (Signed)
Ok to refill 

## 2014-10-29 NOTE — Telephone Encounter (Signed)
Dr Carlota Raspberry, pt had CPE with you in Sept but I don't see this med discussed. Can we RF?

## 2014-11-28 ENCOUNTER — Ambulatory Visit (INDEPENDENT_AMBULATORY_CARE_PROVIDER_SITE_OTHER): Payer: PRIVATE HEALTH INSURANCE | Admitting: Family Medicine

## 2014-11-28 VITALS — BP 130/70 | HR 77 | Temp 97.9°F | Resp 18 | Ht 63.0 in | Wt 166.6 lb

## 2014-11-28 DIAGNOSIS — R059 Cough, unspecified: Secondary | ICD-10-CM

## 2014-11-28 DIAGNOSIS — Z87442 Personal history of urinary calculi: Secondary | ICD-10-CM

## 2014-11-28 DIAGNOSIS — Z91048 Other nonmedicinal substance allergy status: Secondary | ICD-10-CM

## 2014-11-28 DIAGNOSIS — R05 Cough: Secondary | ICD-10-CM

## 2014-11-28 DIAGNOSIS — R062 Wheezing: Secondary | ICD-10-CM | POA: Diagnosis not present

## 2014-11-28 DIAGNOSIS — Z9109 Other allergy status, other than to drugs and biological substances: Secondary | ICD-10-CM

## 2014-11-28 MED ORDER — HYDROCODONE-HOMATROPINE 5-1.5 MG/5ML PO SYRP: 5.0000 mL | ORAL_SOLUTION | Freq: Three times a day (TID) | ORAL | Status: DC | PRN

## 2014-11-28 MED ORDER — TAMSULOSIN HCL 0.4 MG PO CAPS: 0.4000 mg | ORAL_CAPSULE | Freq: Every day | ORAL | Status: DC

## 2014-11-28 MED ORDER — MONTELUKAST SODIUM 10 MG PO TABS: 10.0000 mg | ORAL_TABLET | Freq: Every day | ORAL | Status: DC

## 2014-11-28 MED ORDER — PREDNISONE 20 MG PO TABS: ORAL_TABLET | ORAL | Status: DC

## 2014-11-28 MED ORDER — HYDROCODONE-ACETAMINOPHEN 5-325 MG PO TABS: 1.0000 | ORAL_TABLET | Freq: Three times a day (TID) | ORAL | Status: DC | PRN

## 2014-11-28 MED ORDER — ALBUTEROL SULFATE HFA 108 (90 BASE) MCG/ACT IN AERS: 2.0000 | INHALATION_SPRAY | Freq: Four times a day (QID) | RESPIRATORY_TRACT | Status: DC | PRN

## 2014-11-28 NOTE — Progress Notes (Signed)
[facility] [address] [place] [address] [phone]  Date:  11/28/2014   Name:  CARLON DAVIDSON   DOB:  Mar 29, 1951   MRN:  [id number]  PCP:  Lamar Blinks, MD    Chief Complaint: Cough; Wheezing; and Medication Refill   History of Present Illness:  ARNETIA BRONK is a 64 y.o. very pleasant female patient who presents with the following:  She has been feeling ill for a couple of weeks- wonders if this may be due to allergies.  However she has not been able to get her sx under control herself.  She notes a lot of coughing, some chest tightness, mucus down her throat.   No sneezing.  She thinks that she has been wheezing some.  She did try some albuterol that she had at home but it was quite old and did not seem to help She thinks that she may have had a fever a week or so ago, but not currently.   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    She is using zyrtec and flonase, but had run out of her singulair.   She has used prednisone in the past and did not notice any SE  Also, she has recently been dx with kidney stones.  She has a few still in her kidney that need to come out.  She will occasionally pass a stone- when she does have stone pain she would like to have some flomax and pain medication to use as needed  Patient Active Problem List   Diagnosis Date Noted  . Cough 01/02/2013  . Anxiety state, unspecified 01/02/2013  . Breast cancer (DCIS), stage 0, Left, receptor +, dx 2012 09/08/2010    Past Medical History  Diagnosis Date  . Asthma   . Rectal bleeding     hemorroid  . Hiatal hernia   . Kidney stone   . Sinus drainage   . Contact lens/glasses fitting   . Bruises easily   . Arthritis   . Anxiety   . Allergy   . Cancer     double mastectomy  . Osteoporosis   . Heart murmur   . Depression     Past Surgical History  Procedure Laterality Date  . Breast  lumpectomy w/ needle localization  10/05/2010    lumpectomy  . Ovarian cyst surgery  1997, 2001, 2008  . Mastectomy w/ sentinel node biopsy  01/26/2011  . Simple mastectomy  01/26/2011  . Appendectomy    . Breast surgery      History  Substance Use Topics  . Smoking status: Never Smoker   . Smokeless tobacco: Not on file  . Alcohol Use: 0.0 oz/week    0 Standard drinks or equivalent per week     Comment: occasional    Family History  Problem Relation Age of Onset  . Hypertension Mother   . Heart Problems Mother   . Hyperlipidemia Mother   . Hypertension Father   . Heart failure Paternal Grandmother   . Heart failure Paternal Grandfather   . Cancer Brother   . Cancer Sister   . Diabetes Sister     Allergies  Allergen Reactions  . Elavil [Amitriptyline Hcl] Rash  . Sulfur Rash    Medication list has been reviewed and updated.  Current Outpatient Prescriptions on File Prior to Visit  Medication Sig Dispense  Refill  . albuterol (PROVENTIL HFA;VENTOLIN HFA) 108 (90 BASE) MCG/ACT inhaler Inhale 2 puffs into the lungs every 6 (six) hours as needed for wheezing or shortness of breath. 1 Inhaler 1  . calcium-vitamin D (OSCAL WITH D) 500-200 MG-UNIT per tablet Take 1 tablet by mouth daily.    . cetirizine (ZYRTEC) 10 MG tablet Take 10 mg by mouth daily as needed for allergies.     . clonazePAM (KLONOPIN) 0.5 MG tablet Take 1 tablet (0.5 mg total) by mouth 2 (two) times daily as needed for anxiety. 30 tablet 1  . DULoxetine (CYMBALTA) 30 MG capsule Take 1 capsule (30 mg total) by mouth daily. 90 capsule 1  . fluticasone (FLONASE) 50 MCG/ACT nasal spray USE 2 SPRAYS IN EACH NOSTRIL EVERY DAY AS DIRECTED 16 g 5  . zoster vaccine live, PF, (ZOSTAVAX) 97026 UNT/0.65ML injection Inject 19,400 Units into the skin once. (Patient not taking: Reported on 09/22/2014) 1 each 0   No current facility-administered medications on file prior to visit.    Review of Systems:  As per HPI-  otherwise negative.   Physical Examination: Filed Vitals:   11/28/14 1312  BP: 130/70  Pulse: 77  Temp: 97.9 F (36.6 C)  Resp: 18   Filed Vitals:   11/28/14 1312  Height: 5\' 3"  (1.6 m)  Weight: 166 lb 9.6 oz (75.569 kg)   Body mass index is 29.52 kg/(m^2). Ideal Body Weight: Weight in (lb) to have BMI = 25: 140.8  GEN: WDWN, NAD, Non-toxic, A & O x 3, looks well, overweight HEENT: Atraumatic, Normocephalic. Neck supple. No masses, No LAD.  Bilateral TM wnl, oropharynx normal.  PEERL,EOMI.   Ears and Nose: No external deformity. CV: RRR, No M/G/R. No JVD. No thrill. No extra heart sounds. PULM: CTA B, no wheezes, crackles, rhonchi. No retractions. No resp. distress. No accessory muscle use. EXTR: No c/c/e NEURO Normal gait.  PSYCH: Normally interactive. Conversant. Not depressed or anxious appearing.  Calm demeanor.    Assessment and Plan: Cough - Plan: HYDROcodone-homatropine (HYCODAN) 5-1.5 MG/5ML syrup, albuterol (PROVENTIL HFA;VENTOLIN HFA) 108 (90 BASE) MCG/ACT inhaler, predniSONE (DELTASONE) 20 MG tablet  Wheezing - Plan: montelukast (SINGULAIR) 10 MG tablet, albuterol (PROVENTIL HFA;VENTOLIN HFA) 108 (90 BASE) MCG/ACT inhaler, predniSONE (DELTASONE) 20 MG tablet  Environmental allergies - Plan: montelukast (SINGULAIR) 10 MG tablet, predniSONE (DELTASONE) 20 MG tablet  History of kidney stones - Plan: tamsulosin (FLOMAX) 0.4 MG CAPS capsule, HYDROcodone-acetaminophen (NORCO/VICODIN) 5-325 MG per tablet  Treat for allergy exacerbation with albuterol, add singulair back, hycodan as needed at night for cough.   See patient instructions for more details.   Refilled her flomax and vicodin to use as needed for occasional kidney stone pain   Signed Lamar Blinks, MD

## 2014-11-28 NOTE — Patient Instructions (Signed)
Start back on singulair, and use the albuterol as needed for wheezing Use the prednisone as directed for allergy sx Use the cough syrup as needed but remember this will make you sleepy- do not use when you need to drive Let me know if you are not feeling better soon!   If/when you have another stone, use the flomax for a few days and the pain medication as needed Do not combine the pain medication and the cough syrup Do not drive on the pain medication

## 2014-12-19 ENCOUNTER — Other Ambulatory Visit (HOSPITAL_COMMUNITY): Payer: Self-pay | Admitting: Gynecology

## 2014-12-23 LAB — CYTOLOGY - PAP

## 2015-03-25 ENCOUNTER — Other Ambulatory Visit: Payer: Self-pay | Admitting: Family Medicine

## 2015-04-23 ENCOUNTER — Other Ambulatory Visit: Payer: Self-pay | Admitting: Physician Assistant

## 2015-05-14 ENCOUNTER — Ambulatory Visit (INDEPENDENT_AMBULATORY_CARE_PROVIDER_SITE_OTHER): Payer: PRIVATE HEALTH INSURANCE | Admitting: Family Medicine

## 2015-05-14 ENCOUNTER — Telehealth: Payer: Self-pay

## 2015-05-14 ENCOUNTER — Ambulatory Visit (INDEPENDENT_AMBULATORY_CARE_PROVIDER_SITE_OTHER): Payer: PRIVATE HEALTH INSURANCE

## 2015-05-14 VITALS — BP 122/62 | HR 91 | Temp 97.8°F | Resp 18 | Ht 63.0 in | Wt 166.4 lb

## 2015-05-14 DIAGNOSIS — F411 Generalized anxiety disorder: Secondary | ICD-10-CM

## 2015-05-14 DIAGNOSIS — H9313 Tinnitus, bilateral: Secondary | ICD-10-CM

## 2015-05-14 DIAGNOSIS — M79671 Pain in right foot: Secondary | ICD-10-CM | POA: Diagnosis not present

## 2015-05-14 NOTE — Patient Instructions (Addendum)
Call your audiologist for appointment to look into decreased hearing as cause of tinnitus (ringing/noise in ears). If you have headache, dizziness, or ear pain with this - recommend recheck to look into other causes or can refer to Ear Nose and throat.   Can wean cymbalta slowly. Your prescription will be for each day, but can initially skip every 3rd day for a week, then take every other day for a week, then take one every 3 days for a week, then stop. Klonopin if needed, and let me know if symptoms worsen.   For your foot pain, this appears to be some pain at the metatarsal heads or metatarsalgia.  This should improve with some relative rest and you can also try the insert to take the pressure off the metatarsal heads as we discussed. If this causes more discomfort, you do not need to use the insert.   If the pain is not improving over the next week or two with some relative rest, would recommend we look into other causes including a possible stress fracture or stress injury that could be detected on MRI.  Alternatively I can refer you to an orthopedic surgeon if you would prefer. Please let me know how you're doing over the next week or 2, and if any worsening sooner, return for recheck.  Return to the clinic or go to the nearest emergency room if any of your symptoms worsen or new symptoms occur.  Tinnitus Sounds you hear in your ears and coming from within the ear is called tinnitus. This can be a symptom of many ear disorders. It is often associated with hearing loss.  Tinnitus can be seen with:  Infections.  Ear blockages such as wax buildup.  Meniere's disease.  Ear damage.  Inherited.  Occupational causes. While irritating, it is not usually a threat to health. When the cause of the tinnitus is wax, infection in the middle ear, or foreign body it is easily treated. Hearing loss will usually be reversible.  TREATMENT  When treating the underlying cause does not get rid of tinnitus,  it may be necessary to get rid of the unwanted sound by covering it up with more pleasant background noises. This may include music, the radio etc. There are tinnitus maskers which can be worn which produce background noise to cover up the tinnitus. Avoid all medications which tend to make tinnitus worse such as alcohol, caffeine, aspirin, and nicotine. There are many soothing background tapes such as rain, ocean, thunderstorms, etc. These soothing sounds help with sleeping or resting. Keep all follow-up appointments and referrals. This is important to identify the cause of the problem. It also helps avoid complications, impaired hearing, disability, or chronic pain. Document Released: 08/02/2005 Document Revised: 10/25/2011 Document Reviewed: 03/20/2008 [facility] Patient Information 2015 [place], [place]. This information is not intended to replace advice given to you by your health care provider. Make sure you discuss any questions you have with your health care provider.

## 2015-05-14 NOTE — Progress Notes (Signed)
Subjective:    Patient ID: [name], female    DOB: November 10, 1950, 64 y.o.   MRN: [id number] This chart was scribed for [name], MD by Marti Sleigh, Medical Scribe. This patient was seen in Room 14 and the patient's care was started a 7:37 PM.  Chief Complaint  Patient presents with  . Medication Refill    HPI HPI Comments: [name] is a 64 y.o. female who presents to [facility] reporting for a prescription refill. She has one pill of Cymbalta left. She has been diagnosed with anxiety/situational anxiety had been improving, previously been on valium, and was switched to klonopin. She states her anxiety has been very situational. She has good days and bad days. She states she had a rough period, where she let her house get messy which she has never let happen before. She states she still misses her mother, who passed away one year ago. She got divorced shortly before that.  She also states she hit her right foot with her left tap shoe one month ago and injured her foot, and has had consistent pain in the right foot since that time.   She also states her ears have been ringing for 1.5 months. 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 She denies drainage or discharge from the ears. She only hears the sound when all other sounds are quiet. Her hearing has not been affected. She denies HA, or dizziness.    Patient Active Problem List   Diagnosis Date Noted  . Cough 01/02/2013  . Anxiety state, unspecified 01/02/2013  . Breast cancer (DCIS), stage 0, Left, receptor +, dx 2012 09/08/2010   Past Medical History  Diagnosis Date  . Asthma   . Rectal bleeding     hemorroid  . Hiatal hernia   . Kidney stone   . Sinus drainage   . Contact lens/glasses fitting   . Bruises easily   . Arthritis   . Anxiety   . Allergy   . Cancer     double mastectomy  . Osteoporosis   . Heart murmur   . Depression    Past Surgical History  Procedure  Laterality Date  . Breast lumpectomy w/ needle localization  10/05/2010    lumpectomy  . Ovarian cyst surgery  1997, 2001, 2008  . Mastectomy w/ sentinel node biopsy  01/26/2011  . Simple mastectomy  01/26/2011  . Appendectomy    . Breast surgery     Allergies  Allergen Reactions  . Elavil [Amitriptyline Hcl] Rash  . Sulfur Rash   Prior to Admission medications   Medication Sig Start Date End Date Taking? Authorizing Provider  calcium-vitamin D (OSCAL WITH D) 500-200 MG-UNIT per tablet Take 1 tablet by mouth daily.   Yes Historical Provider, MD  cetirizine (ZYRTEC) 10 MG tablet Take 10 mg by mouth daily as needed for allergies.    Yes Historical Provider, MD  clonazePAM (KLONOPIN) 0.5 MG tablet Take 1 tablet (0.5 mg total) by mouth 2 (two) times daily as needed for anxiety. 09/22/14  Yes [name], MD  DULoxetine (CYMBALTA) 30 MG capsule TAKE 1 CAPSULE (30 MG TOTAL) BY MOUTH DAILY.  "OV NEEDED FOR REFILLS" 03/26/15  Yes Mancel Bale, PA-C  montelukast (SINGULAIR) 10 MG tablet Take 1 tablet (10 mg total) by mouth at bedtime. 11/28/14  Yes Darreld Mclean, MD   Social History   Social History  . Marital Status: Married  Spouse Name: N/A  . Number of Children: N/A  . Years of Education: N/A   Occupational History  . Not on file.   Social History Main Topics  . Smoking status: Never Smoker   . Smokeless tobacco: Not on file  . Alcohol Use: 0.0 oz/week    0 Standard drinks or equivalent per week     Comment: occasional  . Drug Use: No  . Sexual Activity: No   Other Topics Concern  . Not on file   Social History Narrative    Review of Systems  Constitutional: Negative for fever and chills.  HENT: Positive for tinnitus. Negative for hearing loss.   Psychiatric/Behavioral: Negative for sleep disturbance. The patient is nervous/anxious.        Objective:   Physical Exam  Constitutional: She is oriented to person, place, and time. She appears well-developed and  well-nourished. No distress.  HENT:  Head: Normocephalic and atraumatic.  TMs pearly grey, no fluid.   Eyes: Pupils are equal, round, and reactive to light.  Neck: Neck supple.  Cardiovascular: Normal rate.   Pulmonary/Chest: Effort normal. No respiratory distress.  Musculoskeletal: Normal range of motion.  Right foot navicula non tender. First MTP and great toe non tedner. Tender at the dorsal base of the second and third toes, MTPs. She is also tender at the PIP of her second toe. Full ROM in toes, full strength. NVI distally. Slight tenderness at the base of the second and third metatarsal.   Neurological: She is alert and oriented to person, place, and time. Coordination normal.  Skin: Skin is warm and dry. She is not diaphoretic.  Psychiatric: She has a normal mood and affect. Her behavior is normal.  Nursing note and vitals reviewed.   Filed Vitals:   05/14/15 1754  BP: 122/62  Pulse: 91  Temp: 97.8 F (36.6 C)  TempSrc: Oral  Resp: 18  Height: 5\' 3"  (1.6 m)  Weight: 166 lb 6 oz (75.467 kg)  SpO2: 98%    [facility] reading (PRIMARY) by  Dr. Carlota Raspberry: Right foot: No apparent fracture or significant bony injury..       Assessment & Plan:   By signing my name below, I, Judithe Modest, attest that this documentation has been prepared under the direction and in the presence of [name], MD. Electronically Signed: Judithe Modest, ER Scribe. 05/14/2015. 7:10 PM. [name] is a 64 y.o. female Tinnitus, bilateral  No associated headache dizziness or ear pain. May be related to presbycusis, or recent increased noise. She does have an audiologist, so recommended she follow up with her audiologist initially. However if headache, dizziness, ear pain, or notable decreased hearing recommend follow-up here or possible ENT evaluation.   Right foot pain - Plan: DG Foot Complete Right  -Possible metatarsalgia, recommended trial of metatarsal cookie, shoe with wider toe box,  decrease repetitive activity, but if not improving within the next 2 weeks, consider MRI to rule out stress injury with her dance and timing of symptoms.  Anxiety state  -Overall improved. Plan on trial of weaning of Cymbalta as below. RTC precautions of recurrence of her anxiety symptoms that she's had in the past. RTC precautions.  No orders of the defined types were placed in this encounter.   Patient Instructions  Call your audiologist for appointment to look into decreased hearing as cause of tinnitus (ringing/noise in ears). If you have headache, dizziness, or ear pain with this - recommend recheck to look into  other causes or can refer to Ear Nose and throat.   Can wean cymbalta slowly. Your prescription will be for each day, but can initially skip every 3rd day for a week, then take every other day for a week, then take one every 3 days for a week, then stop. Klonopin if needed, and let me know if symptoms worsen.   For your foot pain, this appears to be some pain at the metatarsal heads or metatarsalgia.  This should improve with some relative rest and you can also try the insert to take the pressure off the metatarsal heads as we discussed. If this causes more discomfort, you do not need to use the insert.   If the pain is not improving over the next week or two with some relative rest, would recommend we look into other causes including a possible stress fracture or stress injury that could be detected on MRI.  Alternatively I can refer you to an orthopedic surgeon if you would prefer. Please let me know how you're doing over the next week or 2, and if any worsening sooner, return for recheck.  Return to the clinic or go to the nearest emergency room if any of your symptoms worsen or new symptoms occur.  Tinnitus Sounds you hear in your ears and coming from within the ear is called tinnitus. This can be a symptom of many ear disorders. It is often associated with hearing loss.  Tinnitus  can be seen with:  Infections.  Ear blockages such as wax buildup.  Meniere's disease.  Ear damage.  Inherited.  Occupational causes. While irritating, it is not usually a threat to health. When the cause of the tinnitus is wax, infection in the middle ear, or foreign body it is easily treated. Hearing loss will usually be reversible.  TREATMENT  When treating the underlying cause does not get rid of tinnitus, it may be necessary to get rid of the unwanted sound by covering it up with more pleasant background noises. This may include music, the radio etc. There are tinnitus maskers which can be worn which produce background noise to cover up the tinnitus. Avoid all medications which tend to make tinnitus worse such as alcohol, caffeine, aspirin, and nicotine. There are many soothing background tapes such as rain, ocean, thunderstorms, etc. These soothing sounds help with sleeping or resting. Keep all follow-up appointments and referrals. This is important to identify the cause of the problem. It also helps avoid complications, impaired hearing, disability, or chronic pain. Document Released: 08/02/2005 Document Revised: 10/25/2011 Document Reviewed: 03/20/2008 [facility] Patient Information 2015 [place], [place]. This information is not intended to replace advice given to you by your health care provider. Make sure you discuss any questions you have with your health care provider.          I personally performed the services described in this documentation, which was scribed in my presence. The recorded information has been reviewed and considered, and addended by me as needed.

## 2015-05-17 ENCOUNTER — Other Ambulatory Visit: Payer: Self-pay | Admitting: Family Medicine

## 2015-05-17 ENCOUNTER — Other Ambulatory Visit: Payer: Self-pay

## 2015-05-17 MED ORDER — DULOXETINE HCL 30 MG PO CPEP: ORAL_CAPSULE | ORAL | Status: DC

## 2015-05-17 NOTE — Telephone Encounter (Signed)
Rx check

## 2015-05-17 NOTE — Telephone Encounter (Signed)
Pt called about Rx-Cymbolta (generic version)/// Confirmed with [facility] and they sent over RX on 05/17/15///   [phone]

## 2015-05-19 NOTE — Telephone Encounter (Signed)
Ok to refill for 0.5mg  1 BID prn, #30, no refill. Will route to PA pool in my absence if possible to have this prescribed as I am out of office for a few days.

## 2015-05-20 NOTE — Telephone Encounter (Signed)
Faxed

## 2015-05-21 ENCOUNTER — Other Ambulatory Visit: Payer: Self-pay | Admitting: Family Medicine

## 2015-05-21 ENCOUNTER — Encounter: Payer: Self-pay | Admitting: Family Medicine

## 2015-05-21 ENCOUNTER — Ambulatory Visit (INDEPENDENT_AMBULATORY_CARE_PROVIDER_SITE_OTHER): Payer: PRIVATE HEALTH INSURANCE | Admitting: Family Medicine

## 2015-05-21 VITALS — BP 124/70 | HR 77 | Temp 98.3°F | Resp 16 | Ht 62.0 in | Wt 165.4 lb

## 2015-05-21 DIAGNOSIS — Z131 Encounter for screening for diabetes mellitus: Secondary | ICD-10-CM | POA: Diagnosis not present

## 2015-05-21 DIAGNOSIS — Z13220 Encounter for screening for lipoid disorders: Secondary | ICD-10-CM

## 2015-05-21 DIAGNOSIS — R1013 Epigastric pain: Secondary | ICD-10-CM

## 2015-05-21 DIAGNOSIS — R748 Abnormal levels of other serum enzymes: Secondary | ICD-10-CM

## 2015-05-21 LAB — COMPREHENSIVE METABOLIC PANEL
ALBUMIN: 4.5 g/dL (ref 3.6–5.1)
ALT: 14 U/L (ref 6–29)
AST: 17 U/L (ref 10–35)
Alkaline Phosphatase: 144 U/L — ABNORMAL HIGH (ref 33–130)
BUN: 11 mg/dL (ref 7–25)
CHLORIDE: 102 mmol/L (ref 98–110)
CO2: 27 mmol/L (ref 20–31)
CREATININE: 0.76 mg/dL (ref 0.50–0.99)
Calcium: 9.1 mg/dL (ref 8.6–10.4)
Glucose, Bld: 92 mg/dL (ref 65–99)
POTASSIUM: 4.7 mmol/L (ref 3.5–5.3)
SODIUM: 140 mmol/L (ref 135–146)
TOTAL PROTEIN: 6.8 g/dL (ref 6.1–8.1)
Total Bilirubin: 0.6 mg/dL (ref 0.2–1.2)

## 2015-05-21 LAB — CBC
HCT: 41.3 % (ref 36.0–46.0)
Hemoglobin: 13.8 g/dL (ref 12.0–15.0)
MCH: 31 pg (ref 26.0–34.0)
MCHC: 33.4 g/dL (ref 30.0–36.0)
MCV: 92.8 fL (ref 78.0–100.0)
MPV: 9 fL (ref 8.6–12.4)
PLATELETS: 259 10*3/uL (ref 150–400)
RBC: 4.45 MIL/uL (ref 3.87–5.11)
RDW: 13.6 % (ref 11.5–15.5)
WBC: 6.7 10*3/uL (ref 4.0–10.5)

## 2015-05-21 LAB — LIPID PANEL
CHOL/HDL RATIO: 7.3 ratio — AB (ref <=5.0)
CHOLESTEROL: 247 mg/dL — AB (ref 125–200)
HDL: 34 mg/dL — ABNORMAL LOW (ref >=46)
LDL CALC: 147 mg/dL — AB (ref <130)
TRIGLYCERIDES: 329 mg/dL — AB (ref <150)
VLDL: 66 mg/dL — AB (ref <30)

## 2015-05-21 MED ORDER — OMEPRAZOLE 40 MG PO CPDR: 40.0000 mg | DELAYED_RELEASE_CAPSULE | Freq: Every day | ORAL | Status: DC

## 2015-05-21 MED ORDER — SUCRALFATE 1 G PO TABS: 1.0000 g | ORAL_TABLET | Freq: Three times a day (TID) | ORAL | Status: DC

## 2015-05-21 NOTE — Progress Notes (Signed)
[facility] [address], [place] [address] [phone]  Date:  05/21/2015   Name:  PEARLENE TEAT   DOB:  [birth year]   MRN:  [id number]  PCP:  Lamar Blinks, MD    Chief Complaint: belching alot; pain behind left breast; and ringing in the ears   History of Present Illness:  LYNNLEE REVELS is a 64 y.o. very pleasant female patient who presents with the following:  She was here by my partner Dr. Carlota Raspberry on 05/14/15 due to ringing in her ears.  She came that day really just to have her cymbalta refilled, but also to discuss tapering off the cymbalta. She also had hurt her foot while tap dancing.  While waiting for a foot x-ray she noted a "squeezing in my chest, I started to feel nauseated, I felt really really hot and dizzy."  She has had this in the past and thought it was due to anxiety. She also has a known history of hiatal hernia.  She did some deep breathing and tried to calm herself down.  She decided not to mention this at her visit on 9/28 because she just wanted to go home  The episode lasted about 30 minutes.  It has not recurred since then.  No SOB She had a heart murmur as a child- no further known cardiac history.  She has had palpitations at some point in the past- this was found to be benign.   Her grandparents had some heart trouble in their older years.   She has not noted any exertional CP  She noted increased burping after this episode.  Every time she burps she noted a "stabbling pain" behind her left breast (implnat). She called her GI doctor but they are not able to see her for a few weeks   She also notes "these momentary little flashes of light-headedness."    She also thought she might have had a kidney stone, but she used her prn medication for pain and muscle relaxer.  This pain is now resolved.    She also noted ringing in her ears still for about one month.  She plans to see audiology to discuss this further.  It is not pulsatile,  she can not locate it in a particular ear.  She is able to cover it with minimal white or background noise.   She plans to wean off her cymbalta and is in the process of doing this now  She has been on some meds for stomach acid in the past, but more recently has tried to manage this through lifestyle.   She notes that she has to prop herself up to sleep or she may get "strange dreams" at night.    She ate a fig bar and a cup of coffee so far today  She notes that she has some job stressors- they got a new boss who is difficult to deal with.  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  She would like a doctor's note excusing her from driving this but for a month    Patient Active Problem List   Diagnosis Date Noted  . Cough 01/02/2013  . Anxiety state, unspecified 01/02/2013  . Breast cancer (DCIS), stage 0, Left, receptor +, dx 2012 09/08/2010    Past Medical History  Diagnosis Date  . Asthma   . Rectal bleeding  hemorroid  . Hiatal hernia   . Kidney stone   . Sinus drainage   . Contact lens/glasses fitting   . Bruises easily   . Arthritis   . Anxiety   . Allergy   . Cancer [facility])     double mastectomy  . Osteoporosis   . Heart murmur   . Depression     Past Surgical History  Procedure Laterality Date  . Breast lumpectomy w/ needle localization  10/05/2010    lumpectomy  . Ovarian cyst surgery  1997, 2001, 2008  . Mastectomy w/ sentinel node biopsy  01/26/2011  . Simple mastectomy  01/26/2011  . Appendectomy    . Breast surgery      Social History  Substance Use Topics  . Smoking status: Never Smoker   . Smokeless tobacco: None  . Alcohol Use: 0.0 oz/week    0 Standard drinks or equivalent per week     Comment: occasional    Family History  Problem Relation Age of Onset  . Hypertension Mother   . Heart Problems Mother   . Hyperlipidemia Mother   . Hypertension Father   .  Heart failure Paternal Grandmother   . Heart failure Paternal Grandfather   . Cancer Brother   . Cancer Sister   . Diabetes Sister     Allergies  Allergen Reactions  . Elavil [Amitriptyline Hcl] Rash  . Sulfur Rash    Medication list has been reviewed and updated.  Current Outpatient Prescriptions on File Prior to Visit  Medication Sig Dispense Refill  . calcium-vitamin D (OSCAL WITH D) 500-200 MG-UNIT per tablet Take 1 tablet by mouth daily.    . cetirizine (ZYRTEC) 10 MG tablet Take 10 mg by mouth daily as needed for allergies.     . clonazePAM (KLONOPIN) 0.5 MG tablet TAKE 1 TABLET BY MOUTH TWICE DAILY 30 tablet 0  . DULoxetine (CYMBALTA) 30 MG capsule TAKE 1 CAPSULE (30 MG TOTAL) BY MOUTH DAILY. 30 capsule 0  . montelukast (SINGULAIR) 10 MG tablet Take 1 tablet (10 mg total) by mouth at bedtime. 30 tablet 3   No current facility-administered medications on file prior to visit.    Review of Systems:  As per HPI- otherwise negative.   Physical Examination: Filed Vitals:   05/21/15 1110  BP: 124/70  Pulse: 77  Temp: 98.3 F (36.8 C)  Resp: 16   Filed Vitals:   05/21/15 1110  Height: 5\' 2"  (1.575 m)  Weight: 165 lb 6.4 oz (75.025 kg)   Body mass index is 30.24 kg/(m^2). Ideal Body Weight: Weight in (lb) to have BMI = 25: 136.4  GEN: WDWN, NAD, Non-toxic, A & O x 3, obese, looks well HEENT: Atraumatic, Normocephalic. Neck supple. No masses, No LAD.  Bilateral TM wnl, oropharynx normal.  PEERL,EOMI.   Ears and Nose: No external deformity. CV: RRR, No M/G/R. No JVD. No thrill. No extra heart sounds. PULM: CTA B, no wheezes, crackles, rhonchi. No retractions. No resp. distress. No accessory muscle use. No current abd tenderness noted  ABD: S, NT, ND, +BS. No rebound. No HSM.   EXTR: No c/c/e NEURO Normal gait.  PSYCH: Normally interactive. Conversant. Not depressed or anxious appearing.  Calm demeanor.   EKG:  low voltage chest leads but OW normal, no ST changes  or other acute or concerning changes    Assessment and Plan: Abdominal pain, epigastric - Plan: EKG 12-Lead, CBC, Comprehensive metabolic panel, sucralfate (CARAFATE) 1 G tablet, omeprazole (PRILOSEC) 40 MG  capsule  Screening for diabetes mellitus - Plan: Hemoglobin A1c  Screening for hyperlipidemia - Plan: Lipid panel  Epigastric pain and chest pain related to burping do not see to be cardiac in origin. 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

## 2015-05-21 NOTE — Patient Instructions (Addendum)
Your EKG is reassuring Let's have you use carafate, and prilosec for 2-3 months.   If treating your stomach does not help we will need to consider a heart problem and might think about a stress test Please let me know if symptoms do not improve! Do see your GI doctor as planned.  Ringing in the ears can be a difficult problem to resolve- seeing an audiologist is a fine place to start   Let's recheck in 6 months.  However if you would like to discuss your concerns further I am glad to see you sooner You might also consider finding a therapist to discuss life stressors in more detail

## 2015-05-22 LAB — HEMOGLOBIN A1C
Hgb A1c MFr Bld: 5.9 % — ABNORMAL HIGH (ref <5.7)
Mean Plasma Glucose: 123 mg/dL — ABNORMAL HIGH (ref <117)

## 2015-05-23 ENCOUNTER — Encounter: Payer: Self-pay | Admitting: Family Medicine

## 2015-05-26 LAB — GAMMA GT: GGT: 14 U/L (ref 7–51)

## 2015-05-28 ENCOUNTER — Encounter: Payer: Self-pay | Admitting: Family Medicine

## 2015-06-02 ENCOUNTER — Other Ambulatory Visit: Payer: Self-pay | Admitting: Gastroenterology

## 2015-06-02 DIAGNOSIS — R109 Unspecified abdominal pain: Secondary | ICD-10-CM

## 2015-06-04 ENCOUNTER — Encounter: Payer: Self-pay | Admitting: Family Medicine

## 2015-06-04 ENCOUNTER — Telehealth: Payer: Self-pay

## 2015-06-04 DIAGNOSIS — E785 Hyperlipidemia, unspecified: Secondary | ICD-10-CM

## 2015-06-04 DIAGNOSIS — M79671 Pain in right foot: Secondary | ICD-10-CM

## 2015-06-04 NOTE — Telephone Encounter (Signed)
Pt called regarding 2 things, first would like Dr Carlota Raspberry to refer her to have an imaging done on her foot, it isn't getting any better and they did do an xray  Also Dr Lorelei Pont have been calling regarding her labs.  PLEASE CALL FOR EACH ISSUE IN THE [place] SHE DOESN'T GET HOME UNTIL THEN AT [phone]

## 2015-06-06 MED ORDER — LOVASTATIN 20 MG PO TABS: 20.0000 mg | ORAL_TABLET | Freq: Every day | ORAL | Status: DC

## 2015-06-06 NOTE — Telephone Encounter (Signed)
Called her to go over her labs. Her cholesterol needs improvement. She is willing to try a statin but plans to work on weight loss in hopes of being able to stop taking this.  Also her A1c shows pre-diabetes.  The same steps will help here.   rx for lovastatin 20 once daily, plan to recheck in 4 months. Also she was seen about a month ago with right foot pain. She continues to have pain and would like to pursue an MRI of her right foot- I will arrange this for her

## 2015-06-10 ENCOUNTER — Other Ambulatory Visit: Payer: PRIVATE HEALTH INSURANCE

## 2015-06-10 ENCOUNTER — Ambulatory Visit
Admission: RE | Admit: 2015-06-10 | Discharge: 2015-06-10 | Disposition: A | Discharge status: Home / Self Care | Payer: PRIVATE HEALTH INSURANCE | Source: Ambulatory Visit | Attending: Gastroenterology | Admitting: Gastroenterology

## 2015-06-10 ENCOUNTER — Encounter (INDEPENDENT_AMBULATORY_CARE_PROVIDER_SITE_OTHER): Payer: Self-pay

## 2015-06-10 DIAGNOSIS — R109 Unspecified abdominal pain: Secondary | ICD-10-CM

## 2015-06-12 ENCOUNTER — Other Ambulatory Visit: Payer: Self-pay | Admitting: Family Medicine

## 2015-06-12 ENCOUNTER — Telehealth: Payer: Self-pay

## 2015-06-12 NOTE — Telephone Encounter (Signed)
Doing good on winging her self off  symbalta but which she would take one every other day   Best number [phone]

## 2015-06-13 NOTE — Telephone Encounter (Signed)
Sent in RF to continue weaning and called pt to advise. The message was a unclear and pt stated that she was not just asking for RF or giving report, she was asking for inst'r as to what to do next in weaning process. I read Dr Vonna Kotyk instr's from 9/28 OV and pt voiced understanding. She stated she will continue the current on for two days, off every third day until she runs out of this bottle and then do QOD for 2 weeks, and then take one Q 3 days until the RF runs out. She would rather wean a little more gradually. Pt reports use of new statin drug and does not like the way she is feeling today, a little dizzy and nauseous. Pharm had advised her to start taking statin with dinner instead of Qhs and she will try this. Dr Carlota Raspberry, [facility] [place]

## 2015-06-14 ENCOUNTER — Telehealth: Payer: Self-pay | Admitting: Family Medicine

## 2015-06-14 NOTE — Telephone Encounter (Signed)
lmom of pt new appt time on 11/19/15 @10 :30

## 2015-07-15 ENCOUNTER — Other Ambulatory Visit: Payer: Self-pay | Admitting: Family Medicine

## 2015-07-15 NOTE — Telephone Encounter (Signed)
LM   Is patient done with Cymbalta per note she was weaning off?

## 2015-07-18 MED ORDER — DULOXETINE HCL 30 MG PO CPEP: ORAL_CAPSULE | ORAL | Status: DC

## 2015-07-18 NOTE — Telephone Encounter (Signed)
LM for pt to CB if she still needs this. At rate she was weaning off, she should not need a RF anymore.

## 2015-07-18 NOTE — Addendum Note (Signed)
Addended by: Elwyn Reach A on: 07/18/2015 04:05 PM   Modules accepted: Orders

## 2015-07-18 NOTE — Telephone Encounter (Addendum)
Pt CB and reported that she has not started taking less than QOD, which she has been doing since last phone discussion about this. She stated that she wants to make sure that she is doing what the Dr wanted. I explained again Dr Vonna Kotyk inst's in [place] notes from Sept and that next step was to take 1 tablet Q 3rd day for a week and then stop. Pt had previously stated that she wants to take the weaning process more slowly, and I advised that if she wants to take 1 tablet Q 3rd day for a couple of weeks and then stop, it should be fine, but Dr's inst's after the QOD dosing for a week are "then take one every 3 days for a week, then stop". I will send in one more RF for pt for her to continue the process.

## 2015-08-20 ENCOUNTER — Other Ambulatory Visit: Payer: Self-pay | Admitting: Family Medicine

## 2015-09-05 ENCOUNTER — Encounter: Payer: Self-pay | Admitting: Family Medicine

## 2015-09-10 ENCOUNTER — Encounter: Payer: Self-pay | Admitting: Family Medicine

## 2015-10-14 ENCOUNTER — Other Ambulatory Visit: Payer: Self-pay | Admitting: Physician Assistant

## 2015-10-16 NOTE — Telephone Encounter (Signed)
Refilled

## 2015-10-16 NOTE — Telephone Encounter (Signed)
Faxed

## 2015-10-29 ENCOUNTER — Telehealth: Payer: Self-pay

## 2015-11-19 ENCOUNTER — Ambulatory Visit: Payer: PRIVATE HEALTH INSURANCE | Admitting: Family Medicine

## 2015-11-25 ENCOUNTER — Ambulatory Visit: Payer: PRIVATE HEALTH INSURANCE | Admitting: Family Medicine

## 2016-01-10 ENCOUNTER — Telehealth: Payer: Self-pay

## 2016-01-10 NOTE — Telephone Encounter (Signed)
Pt is requesting orders from the last visit from when she was seen.  Please advise  [phone]

## 2016-01-10 NOTE — Telephone Encounter (Signed)
She needs to be seen, and have the fasting labs repeated. She may schedule with Dr. Lorelei Pont at her new office or schedule with on of the others of [place] here.

## 2016-01-10 NOTE — Telephone Encounter (Signed)
Pt saw Dr. Lorelei Pont seven months ago. Dr. Lorelei Pont told pt in the instructions she was to recheck in 6 months. Not sure if patient was to have lab only visit or OV. Please advise.

## 2016-01-13 NOTE — Telephone Encounter (Signed)
Left message for pt to call back  °

## 2016-01-15 NOTE — Telephone Encounter (Signed)
LM to RTC

## 2016-01-17 ENCOUNTER — Ambulatory Visit (INDEPENDENT_AMBULATORY_CARE_PROVIDER_SITE_OTHER): Payer: PRIVATE HEALTH INSURANCE | Admitting: Family Medicine

## 2016-01-17 VITALS — BP 126/62 | HR 90 | Temp 97.5°F | Resp 16 | Ht 62.0 in | Wt 149.8 lb

## 2016-01-17 DIAGNOSIS — E785 Hyperlipidemia, unspecified: Secondary | ICD-10-CM | POA: Diagnosis not present

## 2016-01-17 DIAGNOSIS — F418 Other specified anxiety disorders: Secondary | ICD-10-CM

## 2016-01-17 LAB — COMPLETE METABOLIC PANEL WITH GFR
ALT: 15 U/L (ref 6–29)
AST: 18 U/L (ref 10–35)
Albumin: 4.7 g/dL (ref 3.6–5.1)
Alkaline Phosphatase: 103 U/L (ref 33–130)
BUN: 10 mg/dL (ref 7–25)
CALCIUM: 9.7 mg/dL (ref 8.6–10.4)
CHLORIDE: 105 mmol/L (ref 98–110)
CO2: 24 mmol/L (ref 20–31)
CREATININE: 0.83 mg/dL (ref 0.50–0.99)
GFR, Est African American: 86 mL/min (ref >=60)
GFR, Est Non African American: 75 mL/min (ref >=60)
Glucose, Bld: 106 mg/dL — ABNORMAL HIGH (ref 65–99)
Potassium: 4.5 mmol/L (ref 3.5–5.3)
Sodium: 142 mmol/L (ref 135–146)
Total Bilirubin: 1 mg/dL (ref 0.2–1.2)
Total Protein: 7.2 g/dL (ref 6.1–8.1)

## 2016-01-17 LAB — LIPID PANEL
CHOLESTEROL: 163 mg/dL (ref 125–200)
HDL: 42 mg/dL — ABNORMAL LOW (ref >=46)
LDL Cholesterol: 84 mg/dL (ref <130)
Total CHOL/HDL Ratio: 3.9 Ratio (ref <=5.0)
Triglycerides: 184 mg/dL — ABNORMAL HIGH (ref <150)
VLDL: 37 mg/dL — AB (ref <30)

## 2016-01-17 MED ORDER — FLUOXETINE HCL 20 MG PO TABS: 20.0000 mg | ORAL_TABLET | Freq: Every day | ORAL | Status: DC

## 2016-01-17 NOTE — Progress Notes (Addendum)
By signing my name below I, Tereasa Coop, attest that this documentation has been prepared under the direction and in the presence of [name], MD. Electonically Signed. Tereasa Coop, Scribe 01/17/2016 at 9:00 AM  Subjective:    Patient ID: [name], female    DOB: 1950/09/03, 65 y.o.   MRN: [id number]  Chief Complaint  Patient presents with  . Follow-up    lab work    HPI [name] is a 65 y.o. female who presents to the [facility] for follow up evaluation and lab work. Pt states that she has not eaten any food today.   HLD Lab Results  Component Value Date   CHOL 247* 05/21/2015   HDL 34* 05/21/2015   LDLCALC 147* 05/21/2015   TRIG 329* 05/21/2015   CHOLHDL 7.3* 05/21/2015   Lab Results  Component Value Date   ALT 14 05/21/2015   AST 17 05/21/2015   ALKPHOS 144* 05/21/2015   BILITOT 0.6 05/21/2015   Pt had a normal GGT at 05/21/15. Pt was prescribed lovastatin 20mg  QD with plan to follow up in 4 months. Pt denies any side effects from the lovastatin.    Pre-diabetes  Lab Results  Component Value Date   HGBA1C 5.9* 05/21/2015   Wt Readings from Last 3 Encounters:  01/17/16 149 lb 12.8 oz (67.949 kg)  05/21/15 165 lb 6.4 oz (75.025 kg)  05/14/15 166 lb 6 oz (75.467 kg)   Pt reports that she has increased her exercise and started eating less refined sugars to intentionally lose weight.  Pt has stopped taking her cymbalta. Pt reports tapering her dose and has not taken it in a month. Pt reports that she has been good and her mood has been "up and down". Pt is excited about going to swing dancing tonight. Pt believes she has been managing her stress well. Pt has been mildly depressed and thinks it is due to increased stress. Pt reports big life changes and does not want to discuss it further. Pt has had loss of family members and divorce.   Pt reports having sleep issues for decades. Pt sleeps with multiple pillows due to reflux.  Pt denies difficultly falling asleep and that she wakes up frequently and tosses and turns throughout the night.    Patient Active Problem List   Diagnosis Date Noted  . Cough 01/02/2013  . Anxiety state, unspecified 01/02/2013  . Breast cancer (DCIS), stage 0, Left, receptor +, dx 2012 09/08/2010   Past Medical History  Diagnosis Date  . Asthma   . Rectal bleeding     hemorroid  . Hiatal hernia   . Kidney stone   . Sinus drainage   . Contact lens/glasses fitting   . Bruises easily   . Arthritis   . Anxiety   . Allergy   . Cancer [facility])     double mastectomy  . Osteoporosis   . Heart murmur   . Depression    Past Surgical History  Procedure Laterality Date  . Breast lumpectomy w/ needle localization  10/05/2010    lumpectomy  . Ovarian cyst surgery  1997, 2001, 2008  . Mastectomy w/ sentinel node biopsy  01/26/2011  . Simple mastectomy  01/26/2011  . Appendectomy    . Breast surgery     Allergies  Allergen Reactions  . Elavil [Amitriptyline Hcl] Rash  . Sulfur Rash   Prior to Admission medications   Medication Sig Start Date End  Date Taking? Authorizing Provider  calcium-vitamin D (OSCAL WITH D) 500-200 MG-UNIT per tablet Take 1 tablet by mouth daily.   Yes Historical Provider, MD  cetirizine (ZYRTEC) 10 MG tablet Take 10 mg by mouth daily as needed for allergies.    Yes Historical Provider, MD  clonazePAM (KLONOPIN) 0.5 MG tablet TAKE 1 TABLET BY MOUTH TWICE A DAY 10/16/15  Yes [name], MD  lovastatin (MEVACOR) 20 MG tablet Take 1 tablet (20 mg total) by mouth at bedtime. 06/06/15  Yes Jessica C Copland, MD  omeprazole (PRILOSEC) 40 MG capsule TAKE 1 CAPSULE (40 MG TOTAL) BY MOUTH DAILY. 08/22/15  Yes Gay Filler Copland, MD  DULoxetine (CYMBALTA) 30 MG capsule To continue weaning, take 1 tablet every 3rd day for a week, and then may stop. Patient not taking: Reported on 01/17/2016 07/18/15   [name], MD  montelukast (SINGULAIR) 10 MG tablet Take 1 tablet  (10 mg total) by mouth at bedtime. Patient not taking: Reported on 01/17/2016 11/28/14   Gay Filler Copland, MD  sucralfate (CARAFATE) 1 G tablet Take 1 tablet (1 g total) by mouth 4 (four) times daily -  with meals and at bedtime. Patient not taking: Reported on 01/17/2016 05/21/15   Darreld Mclean, MD   Social History   Social History  . Marital Status: Married    Spouse Name: N/A  . Number of Children: N/A  . Years of Education: N/A   Occupational History  . Not on file.   Social History Main Topics  . Smoking status: Never Smoker   . Smokeless tobacco: Not on file  . Alcohol Use: 0.0 oz/week    0 Standard drinks or equivalent per week     Comment: occasional  . Drug Use: No  . Sexual Activity: No   Other Topics Concern  . Not on file   Social History Narrative      Review of Systems  Psychiatric/Behavioral: Positive for sleep disturbance. Negative for suicidal ideas and self-injury. The patient is nervous/anxious.        Pt is positive for increased stress and mild depression.       Objective:   Physical Exam  Constitutional: She is oriented to person, place, and time. She appears well-developed and well-nourished.  HENT:  Head: Normocephalic and atraumatic.  Eyes: Conjunctivae and EOM are normal. Pupils are equal, round, and reactive to light.  Neck: Carotid bruit is not present.  Cardiovascular: Normal rate, regular rhythm, normal heart sounds and intact distal pulses.   Pulmonary/Chest: Effort normal and breath sounds normal.  Abdominal: Soft. She exhibits no pulsatile midline mass. There is no tenderness.  Neurological: She is alert and oriented to person, place, and time.  Skin: Skin is warm and dry.  Psychiatric: She has a normal mood and affect. Her behavior is normal. She expresses no homicidal and no suicidal ideation.  Vitals reviewed.    Filed Vitals:   01/17/16 0841  BP: 126/62  Pulse: 90  Temp: 97.5 F (36.4 C)  TempSrc: Oral  Resp: 16    Height: 5\' 2"  (1.575 m)  Weight: 149 lb 12.8 oz (67.949 kg)  SpO2: 98%        Assessment & Plan:   DILA HOPWOOD is a 65 y.o. female Hyperlipidemia - Plan: Lipid panel, COMPLETE METABOLIC PANEL WITH GFR  - Recheck CMP, lipid panel.  Depression with anxiety - Plan: FLUoxetine (PROZAC) 20 MG tablet  - Recurrence of anxiety and depression symptoms off  Cymbalta. Some persistent grief with loss of mother, and some stress with ex-husband moving back into town. Suspect some of the difficulty with sleep due to anxiety/depression.  - Decided to restart SSRI, Prozac 20 mg daily. Has Klonopin if needed. Phone numbers for counseling/CBT. Continue activity/exercise.  Recheck in 6 weeks. Sooner if worse.  Meds ordered this encounter  Medications  . FLUoxetine (PROZAC) 20 MG tablet    Sig: Take 1 tablet (20 mg total) by mouth daily.    Dispense:  30 tablet    Refill:  3   Patient Instructions       IF you received an x-ray today, you will receive an invoice from [facility]. Please contact [facility] at [phone] with questions or concerns regarding your invoice.   IF you received labwork today, you will receive an invoice from [employer]. Please contact [facility] at [phone] with questions or concerns regarding your invoice.   Our billing staff will not be able to assist you with questions regarding bills from these companies.  You will be contacted with the lab results as soon as they are available. The fastest way to get your results is to activate your My Chart account. Instructions are located on the last page of this paperwork. If you have not heard from [place] regarding the results in 2 weeks, please contact this office.    I would recommend counseling for your anxiety/depression symptoms. See numbers below.  Start prozac once per day, and recheck in 6 weeks.   Continue same dose of cholesterol medicine for now.   [facility]: [place]: [id number] Criss [id number]    I personally performed the services described in this documentation, which was scribed in my presence. The recorded information has been reviewed and considered, and addended by me as needed.   Signed,   [name], MD [facility].  01/17/2016 9:32 AM

## 2016-01-17 NOTE — Patient Instructions (Addendum)
     IF you received an x-ray today, you will receive an invoice from [facility]. Please contact [facility] at [phone] with questions or concerns regarding your invoice.   IF you received labwork today, you will receive an invoice from [employer]. Please contact [facility] at [phone] with questions or concerns regarding your invoice.   Our billing staff will not be able to assist you with questions regarding bills from these companies.  You will be contacted with the lab results as soon as they are available. The fastest way to get your results is to activate your My Chart account. Instructions are located on the last page of this paperwork. If you have not heard from [place] regarding the results in 2 weeks, please contact this office.    I would recommend counseling for your anxiety/depression symptoms. See numbers below.  Start prozac once per day, and recheck in 6 weeks.   Continue same dose of cholesterol medicine for now.   [facility]: [place]: [id number] [place]: [phone]

## 2016-01-21 ENCOUNTER — Telehealth: Payer: Self-pay

## 2016-01-21 NOTE — Telephone Encounter (Signed)
LMVM for patient to CB.   

## 2016-01-21 NOTE — Telephone Encounter (Signed)
Patient returned call.  She states that the Fluoxetine was written for depression and since starting it she has developed a red itchy rash on bother her arms.  Please advise if something can be called in for rash or if patient needs to RTC to be seen.   Thank you

## 2016-01-21 NOTE — Telephone Encounter (Signed)
Pt was seen by Dr. Carlota Raspberry on  01/17/16 for Hyperlipidemia - Primary. She was prescribed FLUoxetine (PROZAC) 20 MG tablet [id number]. She feels this has given her a rash-little, red, itchy  bumps on both of her arms. She would prefer a call back from Dr. Carlota Raspberry. I let her know Dr. Carlota Raspberry was not working until this Friday 01/23/16 . Please advise at [phone]

## 2016-01-21 NOTE — Telephone Encounter (Signed)
She can stop the Prozac for now, but may be other causes of rash. She can use over-the-counter hydrocortisone cream for the itching 2-3 times per day. If rash does not clear up with stopping Prozac, or any worsening or spread of rash, recommend recheck in office. If the rash resolves, can try restarting Prozac again in 2 weeks and at that time if rash starts again, stop it, and do not use again. Let me know if there are any questions.

## 2016-01-22 NOTE — Telephone Encounter (Signed)
Advised pt of Dr. Vonna Kotyk message. Pt understood.

## 2016-01-27 ENCOUNTER — Telehealth: Payer: Self-pay | Admitting: Emergency Medicine

## 2016-01-27 NOTE — Telephone Encounter (Signed)
Left message for pt to return call for results Will send lab letter

## 2016-01-27 NOTE — Telephone Encounter (Signed)
-----   Message from [name], MD sent at 01/26/2016 10:31 AM EDT ----- Call patient or lab letter:  Electrolytes overall in normal range. Borderline blood sugar and triglycerides, but if not fasting at 8 hours at last visit, these sometimes can be slightly elevated. Other electrolytes, kidney, liver tests looked okay. Follow-up as planned and can discuss any questions on these labs at that time if needed.   Dr. Carlota Raspberry

## 2016-02-22 ENCOUNTER — Other Ambulatory Visit: Payer: Self-pay | Admitting: Family Medicine

## 2016-02-23 NOTE — Telephone Encounter (Signed)
Refilled

## 2016-02-24 NOTE — Telephone Encounter (Signed)
Faxed

## 2016-04-05 ENCOUNTER — Other Ambulatory Visit: Payer: Self-pay | Admitting: Gastroenterology

## 2016-05-14 ENCOUNTER — Other Ambulatory Visit: Payer: Self-pay | Admitting: Family Medicine

## 2016-05-14 DIAGNOSIS — F418 Other specified anxiety disorders: Secondary | ICD-10-CM

## 2016-05-15 NOTE — Telephone Encounter (Signed)
LMOVM for pt to call back.  Note in chart states she was to stop Prozac due to rash.  Restart & if rash appeared again, to stop the Prozac.  No follow up on if she stopped the Prozac or has continued to take.    Await refill response until pt calls.

## 2016-05-18 ENCOUNTER — Other Ambulatory Visit: Payer: Self-pay | Admitting: Family Medicine

## 2016-05-18 NOTE — Telephone Encounter (Signed)
Pt says she is still on the Prozac and is completely out.  She says this is not a medication she can just stop taking and is very anxious to hear about her refill today.  [phone]

## 2016-05-19 NOTE — Telephone Encounter (Signed)
I called pt back to explain the refill is still pending , I mistakenly advised it was called in already. She is one day off meds already and needs this rx today please. Last refill 01/2016 with 3 refills  and is doing well on it.  I see from last ov she was to follow up in 6 weeks and never did. I advised patient we would give 1 month rx .but needs to see pcp this month for further refills.

## 2016-05-19 NOTE — Telephone Encounter (Signed)
Refilled once, but recommended six-week follow-up at last visit in June. Follow-up prior to further refills.

## 2016-05-19 NOTE — Telephone Encounter (Signed)
Left message- rx has been sent to pharmacy.  

## 2016-05-20 ENCOUNTER — Other Ambulatory Visit: Payer: Self-pay | Admitting: Family Medicine

## 2016-05-20 NOTE — Telephone Encounter (Signed)
Faxed RF and LMOM to advise she need f/up for more.

## 2016-06-15 ENCOUNTER — Other Ambulatory Visit: Payer: Self-pay | Admitting: Emergency Medicine

## 2016-06-15 ENCOUNTER — Other Ambulatory Visit: Payer: Self-pay | Admitting: Family Medicine

## 2016-06-15 DIAGNOSIS — E785 Hyperlipidemia, unspecified: Secondary | ICD-10-CM

## 2016-06-15 DIAGNOSIS — F418 Other specified anxiety disorders: Secondary | ICD-10-CM

## 2016-06-15 MED ORDER — LOVASTATIN 20 MG PO TABS
20.0000 mg | ORAL_TABLET | Freq: Every day | ORAL | 0 refills | Status: DC
Start: 2016-06-15 — End: 2016-06-17

## 2016-06-17 ENCOUNTER — Ambulatory Visit (INDEPENDENT_AMBULATORY_CARE_PROVIDER_SITE_OTHER): Payer: PRIVATE HEALTH INSURANCE | Admitting: Family Medicine

## 2016-06-17 ENCOUNTER — Encounter: Payer: Self-pay | Admitting: Family Medicine

## 2016-06-17 VITALS — BP 130/64 | HR 84 | Temp 99.7°F | Resp 16 | Ht 62.0 in | Wt 150.8 lb

## 2016-06-17 DIAGNOSIS — R739 Hyperglycemia, unspecified: Secondary | ICD-10-CM

## 2016-06-17 DIAGNOSIS — E785 Hyperlipidemia, unspecified: Secondary | ICD-10-CM | POA: Diagnosis not present

## 2016-06-17 DIAGNOSIS — K219 Gastro-esophageal reflux disease without esophagitis: Secondary | ICD-10-CM | POA: Diagnosis not present

## 2016-06-17 DIAGNOSIS — J309 Allergic rhinitis, unspecified: Secondary | ICD-10-CM | POA: Diagnosis not present

## 2016-06-17 DIAGNOSIS — F418 Other specified anxiety disorders: Secondary | ICD-10-CM

## 2016-06-17 MED ORDER — LOVASTATIN 20 MG PO TABS: 20.0000 mg | ORAL_TABLET | Freq: Every day | ORAL | 1 refills | Status: DC

## 2016-06-17 MED ORDER — CLONAZEPAM 0.5 MG PO TABS: 0.5000 mg | ORAL_TABLET | Freq: Two times a day (BID) | ORAL | 0 refills | Status: DC

## 2016-06-17 MED ORDER — OMEPRAZOLE 40 MG PO CPDR: DELAYED_RELEASE_CAPSULE | ORAL | 1 refills | Status: DC

## 2016-06-17 MED ORDER — FLUOXETINE HCL 20 MG PO TABS: 20.0000 mg | ORAL_TABLET | Freq: Every day | ORAL | 1 refills | Status: DC

## 2016-06-17 MED ORDER — FLUTICASONE PROPIONATE 50 MCG/ACT NA SUSP: 2.0000 | Freq: Every day | NASAL | 6 refills | Status: DC

## 2016-06-17 NOTE — Progress Notes (Addendum)
By signing my name below, I, [name], attest that this documentation has been prepared under the direction and in the presence of Ellen Ray, MD.  Electronically Signed: Verlee Thompson, Medical Scribe. 06/17/16. 6:34 PM.  Subjective:    Patient ID: [name], female    DOB: [birth year], 65 y.o.   MRN: [id number]  HPI Chief Complaint  Patient presents with  . Medication Refill    Clonazepam 0.5mg , Fluoxetine HCI 20 mg, Lovastatin 20 mg, Omeprazole 40 mg, pt had blood drawn this am    HPI Comments: [name] is a 65 y.o. female who presents to the [facility] for follow-up: anxiety, HLD, and PreDM.  Depression with Anxiety: Last discussed in 01/2016; was having recurrence of symptoms coming off of cymbalta, as well as some sleep difficulty. Decided to start klonopin 0.5mg  PRN, and prozac 20mg , counseling numbers provided and 6 month follow-up recommended. See telephone note 01/21/16; she had a red itchy rash on both arms, thought to be due to the prozac. Advised her to try hydrocortisone cream, but then can try to restart prozac if rash resolved.  Rash has resolved and she never stopped prozac. Reports her mood is better and she's trying to do things for enjoyment. Has been "purging" things she doesn't need and has had 5 yard sales. Takes clonazepam <1 a week, especially on the weekend, or when she's having a restless night. Denies thoughts of self harm, increased drinking, and experiencing negative side effects.  HLD: Takes lovastatin 20mg  Lab Results  Component Value Date   CHOL 163 01/17/2016   HDL 42 (L) 01/17/2016   LDLCALC 84 01/17/2016   TRIG 184 (H) 01/17/2016   CHOLHDL 3.9 01/17/2016   Lab Results  Component Value Date   ALT 15 01/17/2016   AST 18 01/17/2016   ALKPHOS 103 01/17/2016   BILITOT 1.0 01/17/2016   PreDM: Lab Results  Component Value Date   HGBA1C 5.9 (H) 05/21/2015   Suspected GERD/Gastroitis: Heartburn symptoms with  chest pain at the 05/2015 visit. Some heartburn symptoms in the past that were managed by diet change. She was started on prilosec 40mg , and carafate at that time, referred to GI. She had abdominal US 05/2015 which showed gallstones without acute cholecystitis.   Compliant with omeprazole QHS everyday. Will occasionally have break through heartburn that's described as burning in the back of her throat, and nasal congestion with certain foods. Ate tomato based spaghetti for lunch and dinner and had some heartburn. Found relief when she ate half an apple daily in the past; she no longer does this. Denies melena, regular break through in reflux symptoms on omeprazole while on omeprazole.  Elevated HgbA1c of 5.9 05/2015. Glucose was 106 in June 2017  Knee/Hip Discomfort: Pt's knee and ankle is "catching" and she's been feeling discomfort in her hip and lower back for about 3 weeks. Pt got on the floor on her knees without knee pads about 3 weeks ago and the next day she felt her symptoms.  At end of visit -- also asked for flonase refill.  Only takes as needed if going to be around dust or if having PND.  Only taking zyrtec as needed. No new side effects with allergy meds when used.   Patient Active Problem List   Diagnosis Date Noted  . Cough 01/02/2013  . Anxiety state, unspecified 01/02/2013  . Breast cancer (DCIS), stage 0, Left, receptor +, dx 2012 09/08/2010   Past Medical  History:  Diagnosis Date  . Allergy   . Anxiety   . Arthritis   . Asthma   . Bruises easily   . Cancer [facility])    double mastectomy  . Contact lens/glasses fitting   . Depression   . Heart murmur   . Hiatal hernia   . Kidney stone   . Osteoporosis   . Rectal bleeding    hemorroid  . Sinus drainage    Past Surgical History:  Procedure Laterality Date  . APPENDECTOMY    . BREAST LUMPECTOMY W/ NEEDLE LOCALIZATION  10/05/2010   lumpectomy  . BREAST SURGERY    . MASTECTOMY W/ SENTINEL NODE BIOPSY  01/26/2011  .  OVARIAN CYST SURGERY  1997, 2001, 2008  . SIMPLE MASTECTOMY  01/26/2011   Allergies  Allergen Reactions  . Elavil [Amitriptyline Hcl] Rash  . Sulfur Rash   Prior to Admission medications   Medication Sig Start Date End Date Taking? Authorizing Provider  Calcium Citrate (CITRACAL PO) Take by mouth.   Yes Historical Provider, MD  calcium-vitamin D (OSCAL WITH D) 500-200 MG-UNIT per tablet Take 1 tablet by mouth daily.    Historical Provider, MD  cetirizine (ZYRTEC) 10 MG tablet Take 10 mg by mouth daily as needed for allergies.     Historical Provider, MD  clonazePAM (KLONOPIN) 0.5 MG tablet TAKE 1 TABLET BY MOUTH TWICE A DAY 05/19/16   [name], MD  FLUoxetine (PROZAC) 20 MG tablet TAKE 1 TABLET (20 MG TOTAL) BY MOUTH DAILY. 05/19/16   [name], MD  lovastatin (MEVACOR) 20 MG tablet Take 1 tablet (20 mg total) by mouth at bedtime. 06/15/16   Gay Filler Copland, MD  omeprazole (PRILOSEC) 40 MG capsule TAKE 1 CAPSULE (40 MG TOTAL) BY MOUTH DAILY. 08/22/15   Darreld Mclean, MD   Social History   Social History  . Marital status: Married    Spouse name: N/A  . Number of children: N/A  . Years of education: N/A   Occupational History  . Not on file.   Social History Main Topics  . Smoking status: Never Smoker  . Smokeless tobacco: Never Used  . Alcohol use 0.0 oz/week     Comment: occasional  . Drug use: No  . Sexual activity: No   Other Topics Concern  . Not on file   Social History Narrative  . No narrative on file   Review of Systems  Constitutional: Negative for fatigue and unexpected weight change.  Respiratory: Negative for chest tightness and shortness of breath.   Cardiovascular: Negative for chest pain, palpitations and leg swelling.  Gastrointestinal: Negative for abdominal pain and blood in stool.  Musculoskeletal: Positive for arthralgias (discomfort) and back pain.  Skin: Negative for rash.  Neurological: Negative for dizziness, syncope,  light-headedness and headaches.  Psychiatric/Behavioral: Positive for sleep disturbance (takes medication). Negative for self-injury.   Objective:  Physical Exam  Constitutional: She is oriented to person, place, and time. She appears well-developed and well-nourished.  HENT:  Head: Normocephalic and atraumatic.  Eyes: Conjunctivae and EOM are normal. Pupils are equal, round, and reactive to light.  Neck: Carotid bruit is not present.  Cardiovascular: Normal rate, regular rhythm, normal heart sounds and intact distal pulses.  Exam reveals no gallop and no friction rub.   No murmur heard. Pulmonary/Chest: Effort normal and breath sounds normal. No respiratory distress. She has no wheezes. She has no rales.  Abdominal: Soft. She exhibits no pulsatile midline mass. There is  no tenderness.  Musculoskeletal: She exhibits no edema (not appreciable).  Equal ROM in knees No appreciable effusion in knees  Neurological: She is alert and oriented to person, place, and time.  Skin: Skin is warm and dry.  Psychiatric: She has a normal mood and affect. Her behavior is normal.  Vitals reviewed.  BP 130/64   Pulse 84   Temp 99.7 F (37.6 C) (Oral)   Resp 16   Ht 5\' 2"  (1.575 m)   Wt 150 lb 12.8 oz (68.4 kg)   LMP 09/10/2006   SpO2 95%   BMI 27.58 kg/m  Assessment & Plan:   PRANJAL MIGLIACCIO is a 65 y.o. female Gastroesophageal reflux disease, esophagitis presence not specified - Plan: omeprazole (PRILOSEC) 40 MG capsule  - trigger avoidance, Continue PPI as needed, or can try H2 blocker over-the-counter. Long-term risks of PPI as discussed.  Depression with anxiety - Plan: clonazePAM (KLONOPIN) 0.5 MG tablet, FLUoxetine (PROZAC) 20 MG tablet  -Overall stable. Continue same dose of Prozac, Klonopin only if needed.  Dyslipidemia - Plan: lovastatin (MEVACOR) 20 MG tablet, COMPLETE METABOLIC PANEL WITH GFR, Lipid panel  -Tolerating Mevacor, check labs, no change in medicines  Hyperglycemia -  Plan: COMPLETE METABOLIC PANEL WITH GFR, Hemoglobin A1c  -Borderline prediabetic in past, will check A1c, glucose on CMP.  Allergic rhinitis, unspecified chronicity, unspecified seasonality, unspecified trigger  -Continue Flonase as needed, RTC precautions.   Meds ordered this encounter  Medications  . Calcium Citrate (CITRACAL PO)    Sig: Take by mouth.  . clonazePAM (KLONOPIN) 0.5 MG tablet    Sig: Take 1 tablet (0.5 mg total) by mouth 2 (two) times daily.    Dispense:  30 tablet    Refill:  0    Not to exceed 5 additional fills before 08/22/2016.  Marland Kitchen FLUoxetine (PROZAC) 20 MG tablet    Sig: Take 1 tablet (20 mg total) by mouth daily.    Dispense:  90 tablet    Refill:  1  . lovastatin (MEVACOR) 20 MG tablet    Sig: Take 1 tablet (20 mg total) by mouth at bedtime.    Dispense:  90 tablet    Refill:  1  . omeprazole (PRILOSEC) 40 MG capsule    Sig: TAKE 1 CAPSULE (40 MG TOTAL) BY MOUTH DAILY.    Dispense:  90 capsule    Refill:  1  . fluticasone (FLONASE) 50 MCG/ACT nasal spray    Sig: Place 2 sprays into both nostrils daily.    Dispense:  16 g    Refill:  6   Patient Instructions    For heartburn - ok to continue omeprazole if needed up to once per day, but look at foods below and information regarding heartburn. You can always try Zantac or pepcid in place of omeprazole to see if this provides same relief.  We can discuss this further next visit.  Return to the clinic or go to the nearest emergency room if any of your symptoms worsen or new symptoms occur.   No change in Prozac dose for now. Continue Klonopin only if needed for breakthrough anxiety symptoms, but if you require that more frequently, please can return to discuss the symptoms further.  For the borderline elevated blood sugar in the past I will check another 3 month blood sugar test as Dr. Lorelei Pont checked last year.  Tylenol over-the-counter short-term for the knee, hip, back and ankle pain is okay, but  please return to discuss those  symptoms further if not resolving within the next week or 2. Sooner if worse.      Food Choices for Gastroesophageal Reflux Disease, Adult When you have gastroesophageal reflux disease (GERD), the foods you eat and your eating habits are very important. Choosing the right foods can help ease the discomfort of GERD. WHAT GENERAL GUIDELINES DO I NEED TO FOLLOW?  Choose fruits, vegetables, whole grains, low-fat dairy products, and low-fat meat, fish, and poultry.  Limit fats such as oils, salad dressings, butter, nuts, and avocado.  Keep a food diary to identify foods that cause symptoms.  Avoid foods that cause reflux. These may be different for different people.  Eat frequent small meals instead of three large meals each day.  Eat your meals slowly, in a relaxed setting.  Limit fried foods.  Cook foods using methods other than frying.  Avoid drinking alcohol.  Avoid drinking large amounts of liquids with your meals.  Avoid bending over or lying down until 2-3 hours after eating. WHAT FOODS ARE NOT RECOMMENDED? The following are some foods and drinks that may worsen your symptoms: Vegetables Tomatoes. Tomato juice. Tomato and spaghetti sauce. Chili peppers. Onion and garlic. Horseradish. Fruits Oranges, grapefruit, and lemon (fruit and juice). Meats High-fat meats, fish, and poultry. This includes hot dogs, ribs, ham, sausage, salami, and bacon. Dairy Whole milk and chocolate milk. Sour cream. Cream. Butter. Ice cream. Cream cheese.  Beverages Coffee and tea, with or without caffeine. Carbonated beverages or energy drinks. Condiments Hot sauce. Barbecue sauce.  Sweets/Desserts Chocolate and cocoa. Donuts. Peppermint and spearmint. Fats and Oils High-fat foods, including [place] fries and potato chips. Other Vinegar. Strong spices, such as black pepper, white pepper, red pepper, cayenne, curry powder, cloves, ginger, and chili powder. The  items listed above may not be a complete list of foods and beverages to avoid. Contact your dietitian for more information.   This information is not intended to replace advice given to you by your health care provider. Make sure you discuss any questions you have with your health care provider.   Document Released: 08/02/2005 Document Revised: 08/23/2014 Document Reviewed: 06/06/2013 Elsevier Interactive Patient Education 2016 [employer].    IF you received an x-Thompson today, you will receive an invoice from [facility]. Please contact [facility] Rad[facility] at [phone] with questions or concerns regarding your invoice.   IF you received labwork today, you will receive an invoice from [employer]. Please contact [facility] at [phone] with questions or concerns regarding your invoice.   Our billing staff will not be able to assist you with questions regarding bills from these companies.  You will be contacted with the lab results as soon as they are available. The fastest way to get your results is to activate your My Chart account. Instructions are located on the last page of this paperwork. If you have not heard from [place] regarding the results in 2 weeks, please contact this office.         I personally performed the services described in this documentation, which was scribed in my presence. The recorded information has been reviewed and considered, and addended by me as needed.   Signed,   Ellen Ray, MD [facility].  06/17/16 7:12 PM

## 2016-06-17 NOTE — Patient Instructions (Addendum)
For heartburn - ok to continue omeprazole if needed up to once per day, but look at foods below and information regarding heartburn. You can always try Zantac or pepcid in place of omeprazole to see if this provides same relief.  We can discuss this further next visit.  Return to the clinic or go to the nearest emergency room if any of your symptoms worsen or new symptoms occur.   No change in Prozac dose for now. Continue Klonopin only if needed for breakthrough anxiety symptoms, but if you require that more frequently, please can return to discuss the symptoms further.  For the borderline elevated blood sugar in the past I will check another 3 month blood sugar test as Dr. Lorelei Pont checked last year.  Tylenol over-the-counter short-term for the knee, hip, back and ankle pain is okay, but please return to discuss those symptoms further if not resolving within the next week or 2. Sooner if worse.      Food Choices for Gastroesophageal Reflux Disease, Adult When you have gastroesophageal reflux disease (GERD), the foods you eat and your eating habits are very important. Choosing the right foods can help ease the discomfort of GERD. WHAT GENERAL GUIDELINES DO I NEED TO FOLLOW?  Choose fruits, vegetables, whole grains, low-fat dairy products, and low-fat meat, fish, and poultry.  Limit fats such as oils, salad dressings, butter, nuts, and avocado.  Keep a food diary to identify foods that cause symptoms.  Avoid foods that cause reflux. These may be different for different people.  Eat frequent small meals instead of three large meals each day.  Eat your meals slowly, in a relaxed setting.  Limit fried foods.  Cook foods using methods other than frying.  Avoid drinking alcohol.  Avoid drinking large amounts of liquids with your meals.  Avoid bending over or lying down until 2-3 hours after eating. WHAT FOODS ARE NOT RECOMMENDED? The following are some foods and drinks that may  worsen your symptoms: Vegetables Tomatoes. Tomato juice. Tomato and spaghetti sauce. Chili peppers. Onion and garlic. Horseradish. Fruits Oranges, grapefruit, and lemon (fruit and juice). Meats High-fat meats, fish, and poultry. This includes hot dogs, ribs, ham, sausage, salami, and bacon. Dairy Whole milk and chocolate milk. Sour cream. Cream. Butter. Ice cream. Cream cheese.  Beverages Coffee and tea, with or without caffeine. Carbonated beverages or energy drinks. Condiments Hot sauce. Barbecue sauce.  Sweets/Desserts Chocolate and cocoa. Donuts. Peppermint and spearmint. Fats and Oils High-fat foods, including [place] fries and potato chips. Other Vinegar. Strong spices, such as black pepper, white pepper, red pepper, cayenne, curry powder, cloves, ginger, and chili powder. The items listed above may not be a complete list of foods and beverages to avoid. Contact your dietitian for more information.   This information is not intended to replace advice given to you by your health care provider. Make sure you discuss any questions you have with your health care provider.   Document Released: 08/02/2005 Document Revised: 08/23/2014 Document Reviewed: 06/06/2013 Elsevier Interactive Patient Education 2016 [employer].    IF you received an x-ray today, you will receive an invoice from [facility]. Please contact [facility] Radiology at [phone] with questions or concerns regarding your invoice.   IF you received labwork today, you will receive an invoice from [employer]. Please contact [facility] at [phone] with questions or concerns regarding your invoice.   Our billing staff will not be able to assist you with questions regarding bills from these companies.  You will be contacted with the lab results as soon as they are available. The fastest way to get your results is to activate your My Chart account. Instructions are located on the  last page of this paperwork. If you have not heard from [place] regarding the results in 2 weeks, please contact this office.

## 2016-06-21 ENCOUNTER — Telehealth: Payer: Self-pay | Admitting: Family Medicine

## 2016-06-21 NOTE — Telephone Encounter (Signed)
Call patient to discuss last visit, had one other question to discuss with her. No urgency to this phone call, I will try to return the next few days.

## 2016-06-23 ENCOUNTER — Telehealth: Payer: Self-pay

## 2016-06-23 NOTE — Telephone Encounter (Signed)
Pt missed her CB from Dr. Carlota Raspberry and would like another at her work [phone] ext [phone]

## 2016-06-28 NOTE — Telephone Encounter (Signed)
Dr Carlota Raspberry, I did see that you had tried to call pt, and no notes that you had reached her yet. Will forward this to you.

## 2016-06-28 NOTE — Telephone Encounter (Signed)
Called about last ov.  Had bloodwork done regarding needlestick to lab tech - testing normal/negative. Also checking into why her labs have not been resulted. We'll have labs to check into that today. May need to return for lab only visit if not blood is no longer available.

## 2016-06-28 NOTE — Telephone Encounter (Signed)
That will be fine, I let her know earlier that may be needed.Please call her to let her know to return for lab only visit. Let me know if orders need to be placed again.

## 2016-06-30 NOTE — Telephone Encounter (Signed)
Pt advised.

## 2016-07-13 ENCOUNTER — Other Ambulatory Visit: Payer: PRIVATE HEALTH INSURANCE

## 2016-07-22 ENCOUNTER — Other Ambulatory Visit (INDEPENDENT_AMBULATORY_CARE_PROVIDER_SITE_OTHER): Payer: PRIVATE HEALTH INSURANCE

## 2016-07-22 DIAGNOSIS — R739 Hyperglycemia, unspecified: Secondary | ICD-10-CM

## 2016-07-22 DIAGNOSIS — E785 Hyperlipidemia, unspecified: Secondary | ICD-10-CM

## 2016-07-23 LAB — CMP14+EGFR
ALK PHOS: 115 IU/L (ref 39–117)
ALT: 18 IU/L (ref 0–32)
AST: 20 IU/L (ref 0–40)
Albumin/Globulin Ratio: 2.1 (ref 1.2–2.2)
Albumin: 4.6 g/dL (ref 3.6–4.8)
BUN/Creatinine Ratio: 19 (ref 12–28)
BUN: 16 mg/dL (ref 8–27)
Bilirubin Total: 0.8 mg/dL (ref 0.0–1.2)
CO2: 27 mmol/L (ref 18–29)
CREATININE: 0.85 mg/dL (ref 0.57–1.00)
Calcium: 9.6 mg/dL (ref 8.7–10.3)
Chloride: 102 mmol/L (ref 96–106)
GFR calc Af Amer: 84 mL/min/{1.73_m2} (ref >59)
GFR calc non Af Amer: 73 mL/min/{1.73_m2} (ref >59)
GLOBULIN, TOTAL: 2.2 g/dL (ref 1.5–4.5)
GLUCOSE: 95 mg/dL (ref 65–99)
Potassium: 4.7 mmol/L (ref 3.5–5.2)
SODIUM: 143 mmol/L (ref 134–144)
Total Protein: 6.8 g/dL (ref 6.0–8.5)

## 2016-07-23 LAB — LIPID PANEL
CHOL/HDL RATIO: 4.4 ratio (ref 0.0–4.4)
Cholesterol, Total: 201 mg/dL — ABNORMAL HIGH (ref 100–199)
HDL: 46 mg/dL (ref >39)
LDL Calculated: 126 mg/dL — ABNORMAL HIGH (ref 0–99)
Triglycerides: 143 mg/dL (ref 0–149)
VLDL Cholesterol Cal: 29 mg/dL (ref 5–40)

## 2016-07-23 LAB — HEMOGLOBIN A1C
ESTIMATED AVERAGE GLUCOSE: 114 mg/dL
HEMOGLOBIN A1C: 5.6 % (ref 4.8–5.6)

## 2016-08-14 ENCOUNTER — Other Ambulatory Visit: Payer: Self-pay | Admitting: Family Medicine

## 2016-08-14 DIAGNOSIS — F418 Other specified anxiety disorders: Secondary | ICD-10-CM

## 2016-08-16 NOTE — Telephone Encounter (Signed)
Ok to refill. Refill granted.

## 2016-08-16 NOTE — Telephone Encounter (Signed)
SS refill req for clonazepam.  To [name]

## 2016-08-17 NOTE — Telephone Encounter (Signed)
rx called to [facility] 

## 2016-10-14 ENCOUNTER — Other Ambulatory Visit: Payer: Self-pay | Admitting: Family Medicine

## 2016-10-14 DIAGNOSIS — F418 Other specified anxiety disorders: Secondary | ICD-10-CM

## 2016-10-16 NOTE — Telephone Encounter (Signed)
Refilled. Can be faxed in. Follow up in next 2 months. Sooner if increased need of med.

## 2016-10-16 NOTE — Telephone Encounter (Signed)
Called to pharmacy 

## 2016-10-16 NOTE — Telephone Encounter (Signed)
08/2016 last refill 

## 2016-12-18 ENCOUNTER — Other Ambulatory Visit: Payer: Self-pay | Admitting: Family Medicine

## 2016-12-18 DIAGNOSIS — F418 Other specified anxiety disorders: Secondary | ICD-10-CM

## 2016-12-27 ENCOUNTER — Encounter: Payer: Self-pay | Admitting: Family Medicine

## 2016-12-27 ENCOUNTER — Ambulatory Visit (INDEPENDENT_AMBULATORY_CARE_PROVIDER_SITE_OTHER): Payer: PRIVATE HEALTH INSURANCE | Admitting: Family Medicine

## 2016-12-27 VITALS — BP 131/65 | HR 64 | Temp 97.9°F | Resp 16 | Ht 62.0 in | Wt 155.0 lb

## 2016-12-27 DIAGNOSIS — F418 Other specified anxiety disorders: Secondary | ICD-10-CM | POA: Diagnosis not present

## 2016-12-27 DIAGNOSIS — R609 Edema, unspecified: Secondary | ICD-10-CM

## 2016-12-27 DIAGNOSIS — M791 Myalgia, unspecified site: Secondary | ICD-10-CM

## 2016-12-27 DIAGNOSIS — M25562 Pain in left knee: Secondary | ICD-10-CM | POA: Diagnosis not present

## 2016-12-27 DIAGNOSIS — R8299 Other abnormal findings in urine: Secondary | ICD-10-CM

## 2016-12-27 DIAGNOSIS — Z1329 Encounter for screening for other suspected endocrine disorder: Secondary | ICD-10-CM

## 2016-12-27 DIAGNOSIS — M25561 Pain in right knee: Secondary | ICD-10-CM | POA: Diagnosis not present

## 2016-12-27 DIAGNOSIS — R232 Flushing: Secondary | ICD-10-CM | POA: Diagnosis not present

## 2016-12-27 DIAGNOSIS — E875 Hyperkalemia: Secondary | ICD-10-CM

## 2016-12-27 DIAGNOSIS — R82998 Other abnormal findings in urine: Secondary | ICD-10-CM

## 2016-12-27 LAB — POCT URINALYSIS DIP (MANUAL ENTRY)
BILIRUBIN UA: NEGATIVE mg/dL
Bilirubin, UA: NEGATIVE
Blood, UA: NEGATIVE
Glucose, UA: NEGATIVE mg/dL
Nitrite, UA: NEGATIVE
Protein Ur, POC: NEGATIVE mg/dL
Spec Grav, UA: <=1.005 — AB (ref 1.010–1.025)
Urobilinogen, UA: 0.2 E.U./dL
pH, UA: 7 (ref 5.0–8.0)

## 2016-12-27 MED ORDER — FLUOXETINE HCL 20 MG PO TABS: 20.0000 mg | ORAL_TABLET | Freq: Every day | ORAL | 1 refills | Status: DC

## 2016-12-27 MED ORDER — CLONAZEPAM 0.5 MG PO TABS: 0.5000 mg | ORAL_TABLET | Freq: Two times a day (BID) | ORAL | 0 refills | Status: DC | PRN

## 2016-12-27 NOTE — Patient Instructions (Addendum)
I will check some electrolytes, ultrasound, muscle enzyme test and other blood tests for your leg pains. Will also order other testing for the hot flashes. No change in dose of Prozac for now, Klonopin refilled if needed.   If you want to hold off on your statin medication for the next 3-4 weeks, then follow-up to discuss further treatment based on labs, that is fine for now.  If leg pains improve off the statin, then return with restarting statin, would look at other medication.  Return to the clinic or go to the nearest emergency room if any of your symptoms worsen or new symptoms occur.   Peripheral Edema Peripheral edema is swelling that is caused by a buildup of fluid. Peripheral edema most often affects the lower legs, ankles, and feet. It can also develop in the arms, hands, and face. The area of the body that has peripheral edema will look swollen. It may also feel heavy or warm. Your clothes may start to feel tight. Pressing on the area may make a temporary dent in your skin. You may not be able to move your arm or leg as much as usual. There are many causes of peripheral edema. It can be a complication of other diseases, such as congestive heart failure, kidney disease, or a problem with your blood circulation. It also can be a side effect of certain medicines. It often happens to women during pregnancy. Sometimes, the cause is not known. Treating the underlying condition is often the only treatment for peripheral edema. Follow these instructions at home: Pay attention to any changes in your symptoms. Take these actions to help with your discomfort:  Raise (elevate) your legs while you are sitting or lying down.  Move around often to prevent stiffness and to lessen swelling. Do not sit or stand for long periods of time.  Wear support stockings as told by your health care provider.  Follow instructions from your health care provider about limiting salt (sodium) in your diet. Sometimes  eating less salt can reduce swelling.  Take over-the-counter and prescription medicines only as told by your health care provider. Your health care provider may prescribe medicine to help your body get rid of excess water (diuretic).  Keep all follow-up visits as told by your health care provider. This is important. Contact a health care provider if:  You have a fever.  Your edema starts suddenly or is getting worse, especially if you are pregnant or have a medical condition.  You have swelling in only one leg.  You have increased swelling and pain in your legs. Get help right away if:  You develop shortness of breath, especially when you are lying down.  You have pain in your chest or abdomen.  You feel weak.  You faint. This information is not intended to replace advice given to you by your health care provider. Make sure you discuss any questions you have with your health care provider. Document Released: 09/09/2004 Document Revised: 01/05/2016 Document Reviewed: 02/12/2015 Elsevier Interactive Patient Education  2017 [employer].    IF you received an x-ray today, you will receive an invoice from [facility] Radiology. Please contact [facility] Radiology at [phone] with questions or concerns regarding your invoice.   IF you received labwork today, you will receive an invoice from [facility]. Please contact [facility] at [phone] with questions or concerns regarding your invoice.   Our billing staff will not be able to assist you with questions regarding bills from these companies.  You will be contacted with the lab results as soon as they are available. The fastest way to get your results is to activate your My Chart account. Instructions are located on the last page of this paperwork. If you have not heard from [place] regarding the results in 2 weeks, please contact this office.

## 2016-12-27 NOTE — Progress Notes (Signed)
By signing my name below, I, [name], attest that this documentation has been prepared under the direction and in the presence of [name], MD.  Electronically Signed: [name], Medical Scribe. 12/27/16. 5:43 PM.  Subjective:    Patient ID: [name], female    DOB: 10/11/1950, 66 y.o.   MRN: [id number]  HPI Chief Complaint  Patient presents with  . Leg Swelling    and achy for 2-3 weeks    HPI Comments: [name] is a 66 y.o. female who presents to Primary Care at [facility] complaining of leg edema R>L onset 1 month and chronic aching/throbbing for the past 2-3 weeks. She notes increased swelling in her legs by an inch since this morning. Reports associated sxs of intermittent radiating aching to her thighs, and worsening aching at night. Took analgesic cream on her legs with some relief of her sxs. Staying sedentary alleviates her of her sxs. Pt ran out of lovastatin 5 days ago and wants to discontinue it due to possible side effects of stomach ache, nausea, kidney failure, and tenderness in calfs - no changes since running out. Pt has restless leg syndrome and she finds relief by: getting up to do something if she's sitting, sudden shock like a scary noise, or going to sleep if she's watching tv. Pt stopped taking advil due to it being the possible source of the ringing in her ears - has not resolved. Reports more hot flashes than usual. Pt has been going to the pool 2x a week: 1 day for exercise and another time for water volleyball. Pt would also walk in the water or float on something while kicking her legs. Pt lost several lbs after getting off of cymbalta. Denies fibromyalgia sxs and doesn't think it's her problem but reports always being tender. Denies long distant car + plane rides. Denies night sweats, fever, unexplained weight loss, calf cramping, chest pain, trouble breathing, and cough.  Wt Readings from Last 3 Encounters:  12/27/16 155 lb (70.3 kg)    06/17/16 150 lb 12.8 oz (68.4 kg)  01/17/16 149 lb 12.8 oz (67.9 kg)   Kidney: Pt expresses concern for the health of her kidneys. Reports dark yellow urine in the morning and intermittent sweet odor to her urine. Reports drinking lots of water for the past year since having kidney stones. Denies polydipsia.  Anxiety: Pt would like to get a refill of klonopin. She rarely takes it, most 1x a week. She would like to wean herself off of it down the road. Depression screen [facility] 2/9 12/27/2016 06/17/2016 01/17/2016 05/21/2015 05/14/2015  Decreased Interest 0 0 0 0 0  Down, Depressed, Hopeless 0 0 0 0 0  PHQ - 2 Score 0 0 0 0 0   Patient Active Problem List   Diagnosis Date Noted  . Cough 01/02/2013  . Anxiety state, unspecified 01/02/2013  . Breast cancer (DCIS), stage 0, Left, receptor +, dx 2012 09/08/2010   Past Medical History:  Diagnosis Date  . Allergy   . Anxiety   . Arthritis   . Asthma   . Bruises easily   . Cancer [facility])    double mastectomy  . Contact lens/glasses fitting   . Depression   . Heart murmur   . Hiatal hernia   . Kidney stone   . Osteoporosis   . Rectal bleeding    hemorroid  . Sinus drainage    Past Surgical History:  Procedure Laterality Date  . APPENDECTOMY    .  BREAST LUMPECTOMY W/ NEEDLE LOCALIZATION  10/05/2010   lumpectomy  . BREAST SURGERY    . MASTECTOMY W/ SENTINEL NODE BIOPSY  01/26/2011  . OVARIAN CYST SURGERY  1997, 2001, 2008  . SIMPLE MASTECTOMY  01/26/2011   Allergies  Allergen Reactions  . Elavil [Amitriptyline Hcl] Rash  . Sulfur Rash   Prior to Admission medications   Medication Sig Start Date End Date Taking? Authorizing Provider  Calcium Citrate (CITRACAL PO) Take by mouth.    [provider]  calcium-vitamin D (OSCAL WITH D) 500-200 MG-UNIT per tablet Take 1 tablet by mouth daily.    [provider]  cetirizine (ZYRTEC) 10 MG tablet Take 10 mg by mouth daily as needed for allergies.     [provider]   clonazePAM (KLONOPIN) 0.5 MG tablet TAKE 1 TABLET BY MOUTH TWICE A DAY AS NEEDED 10/16/16   [name], MD  FLUoxetine (PROZAC) 20 MG tablet TAKE 1 TABLET (20 MG TOTAL) BY MOUTH DAILY. 12/18/16   [name], MD  fluticasone (FLONASE) 50 MCG/ACT nasal spray Place 2 sprays into both nostrils daily. 06/17/16   [name], MD  lovastatin (MEVACOR) 20 MG tablet Take 1 tablet (20 mg total) by mouth at bedtime. 06/17/16   [name], MD  omeprazole (PRILOSEC) 40 MG capsule TAKE 1 CAPSULE (40 MG TOTAL) BY MOUTH DAILY. 06/17/16   [name], MD   Social History   Social History  . Marital status: Married    Spouse name: N/A  . Number of children: N/A  . Years of education: N/A   Occupational History  . Not on file.   Social History Main Topics  . Smoking status: Never Smoker  . Smokeless tobacco: Never Used  . Alcohol use 0.0 oz/week     Comment: occasional  . Drug use: No  . Sexual activity: No   Other Topics Concern  . Not on file   Social History Narrative  . No narrative on file   Review of Systems  Constitutional: Negative for fever and unexpected weight change.  Respiratory: Negative for cough and shortness of breath.   Cardiovascular: Positive for leg swelling. Negative for chest pain.  Endocrine: Negative for polydipsia.  Musculoskeletal: Positive for myalgias.  Psychiatric/Behavioral: The patient is nervous/anxious.    Objective:  Physical Exam  Constitutional: She appears well-developed and well-nourished. No distress.  HENT:  Head: Normocephalic and atraumatic.  Eyes: Conjunctivae are normal.  Neck: Neck supple.  Cardiovascular: Normal rate, regular rhythm and normal heart sounds.  Exam reveals no gallop and no friction rub.   No murmur heard. Pulses:      Dorsalis pedis pulses are 2+ on the right side, and 2+ on the left side.  Pulmonary/Chest: Effort normal and breath sounds normal. No respiratory distress. She has no wheezes. She  has no rales.  Musculoskeletal:  Calf circumference 15 cm below the patella: 35 on the right / 34 on the left  Neurological: She is alert.  Skin: Skin is warm and dry.  Psychiatric: She has a normal mood and affect. Her behavior is normal.  Nursing note and vitals reviewed.   Vitals:   12/27/16 1704  BP: 131/65  Pulse: 64  Resp: 16  Temp: 97.9 F (36.6 C)  TempSrc: Oral  SpO2: 97%  Weight: 155 lb (70.3 kg)  Height: _0  (1.575 m)   Body mass index is 28.35 kg/m.   Wt Readings from Last 3 Encounters:  12/27/16  155 lb (70.3 kg)  06/17/16 150 lb 12.8 oz (68.4 kg)  01/17/16 149 lb 12.8 oz (67.9 kg)   Results for orders placed or performed in visit on 12/27/16  CK  Result Value Ref Range   Total CK 167 24 - 173 U/L  Magnesium  Result Value Ref Range   Magnesium 2.0 1.6 - 2.3 mg/dL  CMP14+EGFR  Result Value Ref Range   Glucose 80 65 - 99 mg/dL   BUN 14 8 - 27 mg/dL   Creatinine, Ser 0.77 0.57 - 1.00 mg/dL   GFR calc non Af Amer 81 >59 mL/min/1.73   GFR calc Af Amer 94 >59 mL/min/1.73   BUN/Creatinine Ratio 18 12 - 28   Sodium 136 134 - 144 mmol/L   Potassium 7.1 (>) 3.5 - 5.2 mmol/L   Chloride 95 (L) 96 - 106 mmol/L   CO2 26 18 - 29 mmol/L   Calcium 9.9 8.7 - 10.3 mg/dL   Total Protein 7.0 6.0 - 8.5 g/dL   Albumin 4.8 3.6 - 4.8 g/dL   Globulin, Total 2.2 1.5 - 4.5 g/dL   Albumin/Globulin Ratio 2.2 1.2 - 2.2   Bilirubin Total 0.8 0.0 - 1.2 mg/dL   Alkaline Phosphatase 112 39 - 117 IU/L   AST 23 0 - 40 IU/L   ALT 15 0 - 32 IU/L  TSH  Result Value Ref Range   TSH 1.410 0.450 - 4.500 uIU/mL  Sedimentation Rate  Result Value Ref Range   Sed Rate WILL FOLLOW   POCT urinalysis dipstick  Result Value Ref Range   Color, UA yellow yellow   Clarity, UA clear clear   Glucose, UA negative negative mg/dL   Bilirubin, UA negative negative   Ketones, POC UA negative negative mg/dL   Spec Grav, UA <=1.005 (A) 1.010 - 1.025   Blood, UA negative negative   pH, UA 7.0 5.0  - 8.0   Protein Ur, POC negative negative mg/dL   Urobilinogen, UA 0.2 0.2 or 1.0 E.U./dL   Nitrite, UA Negative Negative   Leukocytes, UA Moderate (2+) (A) Negative     Assessment & Plan:    [name] is a 66 y.o. female Myalgia - Plan: CK, Magnesium Arthralgia of both lower legs - Plan: Magnesium, Sedimentation Rate, US Venous Img Lower Bilateral Peripheral edema - Plan: CMP14+EGFR, TSH, US Venous Img Lower Bilateral  - minimal bilateral edema without focal calf pain. Deeper pain. Hx of RLS, but feels different. Not on CCB.    - checked BMP, TSH  - ultrasound, but less likely DVT.   - critical lab call received Wednesday 12/29/16 - Potassium 7.1 without hemolysis but cells in specimen - advised clinical manager to call pt for stat repeat today.  - check baseline CPK, can remain off statin temporarily to see if that helps, then could repeat CPK if restarting stain in 4-6 weeks and sx's return.   -rtc precautions if worse.    Depression with anxiety - Plan: clonazePAM (KLONOPIN) 0.5 MG tablet, FLUoxetine (PROZAC) 20 MG tablet  - stable, cont same dose prozac and prn klonopin. Considering trial off prozac in future.   Screening for thyroid disorder - Plan: TSH  Flushing - Plan: TSH, Sedimentation Rate  - check TSH, sed rate with flushing, but likley postmenopausal and worsened possibly with weather change.   Dark urine - Plan: CMP14+EGFR, POCT urinalysis dipstick  - leukocytes noted, but no other symptoms of infection. Will advise to return for repeat testing  if any abd pain, dysuria, or other urinary symptoms.   Meds ordered this encounter  Medications  . clonazePAM (KLONOPIN) 0.5 MG tablet    Sig: Take 1 tablet (0.5 mg total) by mouth 2 (two) times daily as needed.    Dispense:  30 tablet    Refill:  0    Not to exceed 5 additional fills before 02/13/2017.  Marland Kitchen FLUoxetine (PROZAC) 20 MG tablet    Sig: Take 1 tablet (20 mg total) by mouth daily.    Dispense:  90 tablet     Refill:  1   Patient Instructions   I will check some electrolytes, ultrasound, muscle enzyme test and other blood tests for your leg pains. Will also order other testing for the hot flashes. No change in dose of Prozac for now, Klonopin refilled if needed.   If you want to hold off on your statin medication for the next 3-4 weeks, then follow-up to discuss further treatment based on labs, that is fine for now.  If leg pains improve off the statin, then return with restarting statin, would look at other medication.  Return to the clinic or go to the nearest emergency room if any of your symptoms worsen or new symptoms occur.   Peripheral Edema Peripheral edema is swelling that is caused by a buildup of fluid. Peripheral edema most often affects the lower legs, ankles, and feet. It can also develop in the arms, hands, and face. The area of the body that has peripheral edema will look swollen. It may also feel heavy or warm. Your clothes may start to feel tight. Pressing on the area may make a temporary dent in your skin. You may not be able to move your arm or leg as much as usual. There are many causes of peripheral edema. It can be a complication of other diseases, such as congestive heart failure, kidney disease, or a problem with your blood circulation. It also can be a side effect of certain medicines. It often happens to women during pregnancy. Sometimes, the cause is not known. Treating the underlying condition is often the only treatment for peripheral edema. Follow these instructions at home: Pay attention to any changes in your symptoms. Take these actions to help with your discomfort:  Raise (elevate) your legs while you are sitting or lying down.  Move around often to prevent stiffness and to lessen swelling. Do not sit or stand for long periods of time.  Wear support stockings as told by your health care provider.  Follow instructions from your health care provider about limiting  salt (sodium) in your diet. Sometimes eating less salt can reduce swelling.  Take over-the-counter and prescription medicines only as told by your health care provider. Your health care provider may prescribe medicine to help your body get rid of excess water (diuretic).  Keep all follow-up visits as told by your health care provider. This is important. Contact a health care provider if:  You have a fever.  Your edema starts suddenly or is getting worse, especially if you are pregnant or have a medical condition.  You have swelling in only one leg.  You have increased swelling and pain in your legs. Get help right away if:  You develop shortness of breath, especially when you are lying down.  You have pain in your chest or abdomen.  You feel weak.  You faint. This information is not intended to replace advice given to you by your health care provider.  Make sure you discuss any questions you have with your health care provider. Document Released: 09/09/2004 Document Revised: 01/05/2016 Document Reviewed: 02/12/2015 Elsevier Interactive Patient Education  2017 [employer].    IF you received an x-ray today, you will receive an invoice from [facility]. Please contact [facility] at [phone] with questions or concerns regarding your invoice.   IF you received labwork today, you will receive an invoice from [facility]. Please contact [facility] at [phone] with questions or concerns regarding your invoice.   Our billing staff will not be able to assist you with questions regarding bills from these companies.  You will be contacted with the lab results as soon as they are available. The fastest way to get your results is to activate your My Chart account. Instructions are located on the last page of this paperwork. If you have not heard from [place] regarding the results in 2 weeks, please contact this office.       I personally performed the services described  in this documentation, which was scribed in my presence. The recorded information has been reviewed and considered for accuracy and completeness, addended by me as needed, and agree with information above.  Signed,   [name], MD [facility] at [place].  12/29/16 8:55 AM

## 2016-12-29 ENCOUNTER — Telehealth: Payer: Self-pay | Admitting: Emergency Medicine

## 2016-12-29 LAB — CMP14+EGFR
ALBUMIN: 4.8 g/dL (ref 3.6–4.8)
ALT: 15 IU/L (ref 0–32)
AST: 23 IU/L (ref 0–40)
Albumin/Globulin Ratio: 2.2 (ref 1.2–2.2)
Alkaline Phosphatase: 112 IU/L (ref 39–117)
BILIRUBIN TOTAL: 0.8 mg/dL (ref 0.0–1.2)
BUN / CREAT RATIO: 18 (ref 12–28)
BUN: 14 mg/dL (ref 8–27)
CHLORIDE: 95 mmol/L — AB (ref 96–106)
CO2: 26 mmol/L (ref 18–29)
Calcium: 9.9 mg/dL (ref 8.7–10.3)
Creatinine, Ser: 0.77 mg/dL (ref 0.57–1.00)
GFR calc non Af Amer: 81 mL/min/{1.73_m2} (ref >59)
GFR, EST AFRICAN AMERICAN: 94 mL/min/{1.73_m2} (ref >59)
Globulin, Total: 2.2 g/dL (ref 1.5–4.5)
Glucose: 80 mg/dL (ref 65–99)
POTASSIUM: 7.1 mmol/L — AB (ref 3.5–5.2)
Sodium: 136 mmol/L (ref 134–144)
TOTAL PROTEIN: 7 g/dL (ref 6.0–8.5)

## 2016-12-29 LAB — CK: Total CK: 167 U/L (ref 24–173)

## 2016-12-29 LAB — TSH: TSH: 1.41 u[IU]/mL (ref 0.450–4.500)

## 2016-12-29 LAB — SEDIMENTATION RATE

## 2016-12-29 LAB — MAGNESIUM: Magnesium: 2 mg/dL (ref 1.6–2.3)

## 2016-12-29 NOTE — Telephone Encounter (Signed)
Pt returned message Advised to return to the clinic for repeat K+ level per Dr. Carlota Raspberry.  Pt will be in this afternoon

## 2016-12-29 NOTE — Telephone Encounter (Signed)
Left message to return call 

## 2016-12-30 ENCOUNTER — Other Ambulatory Visit: Payer: PRIVATE HEALTH INSURANCE | Admitting: Physician Assistant

## 2016-12-30 DIAGNOSIS — E875 Hyperkalemia: Secondary | ICD-10-CM

## 2016-12-30 LAB — POTASSIUM: POTASSIUM: 5 mmol/L (ref 3.5–5.2)

## 2016-12-30 NOTE — Progress Notes (Signed)
Pt here for  Bloodwork and updated medication list. Stated not taking Calcium carbonate vitamin d, Mevacor, or prilosec. Medication list updated

## 2016-12-31 NOTE — Telephone Encounter (Signed)
Pt calling about lab results  

## 2016-12-31 NOTE — Telephone Encounter (Signed)
Pt advised back to 5.0  l/m  Any other concerns or recommendations?

## 2016-12-31 NOTE — Telephone Encounter (Signed)
No, that is fine. No concerns with repeat testing. I did receive her message to check A1c and cholesterol. I would like to recheck lipids at follow-up in the next 3-4 weeks off of the statin medication as we discussed. I can check an A1c at that time as well.

## 2017-01-11 ENCOUNTER — Encounter: Payer: Self-pay | Admitting: Family Medicine

## 2017-01-11 NOTE — Telephone Encounter (Signed)
I do not see colonoscopy report. Will ask med records to request report form Dr. Perley Jain office.

## 2017-01-13 ENCOUNTER — Other Ambulatory Visit: Payer: PRIVATE HEALTH INSURANCE

## 2017-01-13 NOTE — Telephone Encounter (Signed)
Lvm 01/12/17 that I was going to send a request over for her results to be sent here. I have sent request.

## 2017-01-18 ENCOUNTER — Ambulatory Visit
Admission: RE | Admit: 2017-01-18 | Discharge: 2017-01-18 | Disposition: A | Discharge status: Home / Self Care | Payer: PRIVATE HEALTH INSURANCE | Source: Ambulatory Visit | Attending: Family Medicine | Admitting: Family Medicine

## 2017-01-18 DIAGNOSIS — M25562 Pain in left knee: Principal | ICD-10-CM

## 2017-01-18 DIAGNOSIS — R609 Edema, unspecified: Secondary | ICD-10-CM

## 2017-01-18 DIAGNOSIS — M25561 Pain in right knee: Secondary | ICD-10-CM

## 2017-03-07 LAB — HM DEXA SCAN

## 2017-04-25 ENCOUNTER — Telehealth: Payer: Self-pay | Admitting: Family Medicine

## 2017-04-25 NOTE — Telephone Encounter (Signed)
Pt is looking for a report regarding TSH level and vitamin d from dr Julien Girt pt obgyn and also needs to know if it is time to follow up and redraw blood for dr Carlota Raspberry  Pt would like to discuss this and then schedule accordingly Pt will be looking for a response to this message thru my chart  Best number [phone]

## 2017-05-03 NOTE — Telephone Encounter (Signed)
Please advise 

## 2017-05-07 NOTE — Telephone Encounter (Signed)
I do not see a report from Dr. Danae Chen office regarding TSH and vitamin D. We can call and requested if needed or clarify with patient was that something Dr. Julien Girt wanted me to address?   As far as follow up, she follow up with me at any point so we can recheck her cholesterol (fasting at that visit if possible), peripheral edema and leg pains.

## 2017-05-09 NOTE — Telephone Encounter (Signed)
Spoke with patient she will make a follow up to have all her blood work done here and a check of  Her edema.  Was advised would like a fasting cholesterol.   She will drop off a copy of her lab results

## 2017-06-07 ENCOUNTER — Telehealth: Payer: Self-pay | Admitting: Family Medicine

## 2017-06-07 NOTE — Telephone Encounter (Signed)
Pt dropped off form for Dr. Carlota Raspberry and she wants to make sure he gets this. It Is a Bone Density Report from [facility].

## 2017-06-10 NOTE — Telephone Encounter (Signed)
Do you have this?

## 2017-06-10 NOTE — Telephone Encounter (Signed)
Yes - I reviewed it a few days ago and labeled it to be scanned. I will send pt mychart message to see if there were specific questions, as I believe that paperwork indicated follow-up with OB/GYN was needed.

## 2017-06-17 ENCOUNTER — Encounter: Payer: Self-pay | Admitting: Family Medicine

## 2017-06-21 ENCOUNTER — Other Ambulatory Visit: Payer: Self-pay | Admitting: Family Medicine

## 2017-06-21 DIAGNOSIS — F418 Other specified anxiety disorders: Secondary | ICD-10-CM

## 2017-07-01 ENCOUNTER — Telehealth: Payer: Self-pay

## 2017-07-01 NOTE — Telephone Encounter (Signed)
Call placed to patient to inquire about change from Fluoxetine tablets to caps. No answer on patient phone, left message for patient to return call to office./ S.Yussuf Sawyers,CMA

## 2017-07-03 ENCOUNTER — Other Ambulatory Visit: Payer: Self-pay | Admitting: Family Medicine

## 2017-07-03 DIAGNOSIS — F418 Other specified anxiety disorders: Secondary | ICD-10-CM

## 2017-07-04 NOTE — Telephone Encounter (Signed)
Klonopin refilled. Appears to be due for follow-up as well. Please schedule follow-up appointment. Thanks.

## 2017-07-04 NOTE — Telephone Encounter (Signed)
Request for controlled substance 

## 2017-07-29 ENCOUNTER — Other Ambulatory Visit: Payer: Self-pay | Admitting: Family Medicine

## 2017-07-29 DIAGNOSIS — F418 Other specified anxiety disorders: Secondary | ICD-10-CM

## 2017-09-03 ENCOUNTER — Other Ambulatory Visit: Payer: Self-pay | Admitting: Family Medicine

## 2017-09-03 DIAGNOSIS — F418 Other specified anxiety disorders: Secondary | ICD-10-CM

## 2017-09-05 NOTE — Telephone Encounter (Signed)
LOV  12/27/16 NOV 09/15/17

## 2017-09-07 ENCOUNTER — Telehealth: Payer: Self-pay | Admitting: Family Medicine

## 2017-09-07 NOTE — Telephone Encounter (Signed)
Copied from [place]. Topic: Quick Communication - See Telephone Encounter >> Sep 07, 2017  2:33 PM Ivar Drape wrote: CRM for notification. See Telephone encounter for:  09/07/17. Patient wants to know if she can have the following prescriptions refilled because she is experiencing the same symptoms of when she had kidney stones in the pass:  1) Tamsulosin Hcl 0.4mg  Flomax  2) Hydrocodone-Acetaminophen 5-325mg 

## 2017-09-08 NOTE — Telephone Encounter (Signed)
LMOVM - pt hasnt been seen for kidney stones since 2014 Needs OV.  Asked pt to call office for same day appt.

## 2017-09-15 ENCOUNTER — Ambulatory Visit (INDEPENDENT_AMBULATORY_CARE_PROVIDER_SITE_OTHER): Payer: PRIVATE HEALTH INSURANCE

## 2017-09-15 ENCOUNTER — Ambulatory Visit: Payer: PRIVATE HEALTH INSURANCE | Admitting: Family Medicine

## 2017-09-15 VITALS — BP 118/64 | HR 74 | Temp 98.8°F | Resp 16 | Ht 62.0 in | Wt 148.0 lb

## 2017-09-15 DIAGNOSIS — M810 Age-related osteoporosis without current pathological fracture: Secondary | ICD-10-CM

## 2017-09-15 DIAGNOSIS — E785 Hyperlipidemia, unspecified: Secondary | ICD-10-CM

## 2017-09-15 DIAGNOSIS — F418 Other specified anxiety disorders: Secondary | ICD-10-CM | POA: Diagnosis not present

## 2017-09-15 DIAGNOSIS — R1031 Right lower quadrant pain: Secondary | ICD-10-CM

## 2017-09-15 DIAGNOSIS — M25561 Pain in right knee: Secondary | ICD-10-CM | POA: Diagnosis not present

## 2017-09-15 DIAGNOSIS — R609 Edema, unspecified: Secondary | ICD-10-CM | POA: Diagnosis not present

## 2017-09-15 DIAGNOSIS — R600 Localized edema: Secondary | ICD-10-CM

## 2017-09-15 DIAGNOSIS — K5900 Constipation, unspecified: Secondary | ICD-10-CM | POA: Diagnosis not present

## 2017-09-15 DIAGNOSIS — Z87898 Personal history of other specified conditions: Secondary | ICD-10-CM | POA: Diagnosis not present

## 2017-09-15 LAB — POCT URINALYSIS DIP (MANUAL ENTRY)
BILIRUBIN UA: NEGATIVE
Glucose, UA: NEGATIVE mg/dL
Ketones, POC UA: NEGATIVE mg/dL
NITRITE UA: NEGATIVE
PH UA: 6.5 (ref 5.0–8.0)
Protein Ur, POC: NEGATIVE mg/dL
Spec Grav, UA: 1.015 (ref 1.010–1.025)
Urobilinogen, UA: 0.2 E.U./dL

## 2017-09-15 LAB — POCT CBC
Granulocyte percent: 60.6 %G (ref 37–80)
HCT, POC: 39.8 % (ref 37.7–47.9)
HEMOGLOBIN: 13.4 g/dL (ref 12.2–16.2)
LYMPH, POC: 2.8 (ref 0.6–3.4)
MCH, POC: 31.6 pg — AB (ref 27–31.2)
MCHC: 33.8 g/dL (ref 31.8–35.4)
MCV: 93.5 fL (ref 80–97)
MID (cbc): 0.4 (ref 0–0.9)
MPV: 6.6 fL (ref 0–99.8)
POC Granulocyte: 4.8 (ref 2–6.9)
POC LYMPH PERCENT: 34.5 %L (ref 10–50)
POC MID %: 4.9 % (ref 0–12)
Platelet Count, POC: 294 10*3/uL (ref 142–424)
RBC: 4.26 M/uL (ref 4.04–5.48)
RDW, POC: 13.2 %
WBC: 8 10*3/uL (ref 4.6–10.2)

## 2017-09-15 LAB — POC MICROSCOPIC URINALYSIS (UMFC): Mucus: ABSENT

## 2017-09-15 MED ORDER — CLONAZEPAM 0.5 MG PO TABS: 0.5000 mg | ORAL_TABLET | Freq: Two times a day (BID) | ORAL | 0 refills | Status: DC | PRN

## 2017-09-15 MED ORDER — FLUOXETINE HCL 20 MG PO TABS: 20.0000 mg | ORAL_TABLET | Freq: Every day | ORAL | 2 refills | Status: DC

## 2017-09-15 NOTE — Progress Notes (Signed)
Subjective:  By signing my name below, I, [name], attest that this documentation has been prepared under the direction and in the presence of [name], MD Electronically Signed: Ladene Artist, ED Scribe 09/15/2017 at 5:37 PM.   Patient ID: [name], female    DOB: 02/01/1951, 67 y.o.   MRN: [id number]  Chief Complaint  Patient presents with  . Knee Pain    pt had a fall in dec/ right, hurts to walk or streching  . Flank Pain    right/ x 1wk/ pt has hx of kidney stone. thinks she may be consepated  . Medication Refill   HPI [name] is a 67 y.o. female who presents to Primary Care at [facility] complaining of multiple concerns today. H/o anxiety, allergic rhinitis, restless leg syndrome, hyperlipidemia. Last seen in 12/2016 for leg pains. Discussed holding lovastatin x 3-4 wks at that time to see if statin was causing myalgias. Had a normal CPK, normal magnesium, TSH, CMP and sed rate in 12/2016. Elevated potassium normal on recheck. Anxiety last discussed 12/2016. At that time she was taking klonopin prn, mostly once/wk, decided to continue Prozac at same dose 20 mg. Had a neg [place] for DVT 01/18/17. Pt is not fasting at this visit.  Pt would like to wean off Prozac, states anxiety has been going well the past 6 months. Uses Klonopin once/wk to assist with sleep. 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  Hyperlipidemia Lab Results  Component Value Date   CHOL 201 (H) 07/22/2016   HDL 46 07/22/2016   LDLCALC 126 (H) 07/22/2016   TRIG 143 07/22/2016   CHOLHDL 4.4 07/22/2016   Lab Results  Component Value Date   ALT 15 12/27/2016   AST 23 12/27/2016   ALKPHOS 112 12/27/2016   BILITOT 0.8 12/27/2016  Trial off lovastatin as above. Has not had lipid testing since 2017. --- States leg pain has improved but she still has "figidity" legs with watching the end of a movie and leg swelling which is unchanged from  last yr. Reports leg swelling improves at night with elevating her legs. Denies cp, sob.  Pre-DM See pt e-mail. Glucose normal on last CMP. A1C 5.6 07/2016 vs 5.9 05/2015.  Osteoporosis OBGYN also wants pt to have her Vit D checked and start Fosamax, however, pt has not started medication yet. Denies broken bones from osteoporosis.  R Knee Pain Pt presents with R knee pain s/p a fall that occurred 4-5 wks ago. She states that knee pain felt a little better for the first few wks but has worsened over the past 3 wks. She noticed some knee swelling the following day but applied ice with some relief. Pain is exacerbated with standing from a seated position and stretching her knee.  RLQ Abdominal Pain Pt also presents with persistent RLQ abdominal pain x 1 wk. Also reports that she has stopped taking calcium since she has been so constipated. She has not tried a stool softener but states she has been eating a high fiber diet. Denies fever, nausea, vomiting. Pshx includes appendectomy.  Patient Active Problem List   Diagnosis Date Noted  . Cough 01/02/2013  . Anxiety state, unspecified 01/02/2013  . Breast cancer (DCIS), stage 0, Left, receptor +, dx 2012 09/08/2010   Past Medical History:  Diagnosis Date  . Allergy   . Anxiety   . Arthritis   . Asthma   .  Bruises easily   . Cancer [facility])    double mastectomy  . Contact lens/glasses fitting   . Depression   . Heart murmur   . Hiatal hernia   . Kidney stone   . Osteoporosis   . Rectal bleeding    hemorroid  . Sinus drainage    Past Surgical History:  Procedure Laterality Date  . APPENDECTOMY    . BREAST LUMPECTOMY W/ NEEDLE LOCALIZATION  10/05/2010   lumpectomy  . BREAST SURGERY    . MASTECTOMY W/ SENTINEL NODE BIOPSY  01/26/2011  . OVARIAN CYST SURGERY  1997, 2001, 2008  . SIMPLE MASTECTOMY  01/26/2011   Allergies  Allergen Reactions  . Elavil [Amitriptyline Hcl] Rash  . Sulfur Rash   Prior to Admission medications     Medication Sig Start Date End Date Taking? Authorizing Provider  calcium-vitamin D (OSCAL WITH D) 500-200 MG-UNIT per tablet Take 1 tablet by mouth daily.    [provider]  cetirizine (ZYRTEC) 10 MG tablet Take 10 mg by mouth daily as needed for allergies.     [provider]  clonazePAM (KLONOPIN) 0.5 MG tablet TAKE 1 TABLET BY MOUTH TWICE A DAY AS NEEDED 07/04/17   [name], MD  FLUoxetine (PROZAC) 20 MG tablet TAKE 1 TABLET BY MOUTH EVERY DAY 09/05/17   [name], MD  fluticasone (FLONASE) 50 MCG/ACT nasal spray Place 2 sprays into both nostrils daily. 06/17/16   [name], MD   Social History   Socioeconomic History  . Marital status: Married    Spouse name: Not on file  . Number of children: Not on file  . Years of education: Not on file  . Highest education level: Not on file  Social Needs  . Financial resource strain: Not on file  . Food insecurity - worry: Not on file  . Food insecurity - inability: Not on file  . Transportation needs - medical: Not on file  . Transportation needs - non-medical: Not on file  Occupational History  . Not on file  Tobacco Use  . Smoking status: Never Smoker  . Smokeless tobacco: Never Used  Substance and Sexual Activity  . Alcohol use: Yes    Alcohol/week: 0.0 oz    Comment: occasional  . Drug use: No  . Sexual activity: No  Other Topics Concern  . Not on file  Social History Narrative  . Not on file   Review of Systems  Constitutional: Negative for fever.  Respiratory: Negative for shortness of breath.   Cardiovascular: Positive for leg swelling. Negative for chest pain.  Gastrointestinal: Positive for abdominal pain and constipation. Negative for nausea and vomiting.  Musculoskeletal: Positive for arthralgias. Negative for myalgias.      Objective:   Physical Exam  Constitutional: She is oriented to person, place, and time. She appears well-developed and well-nourished. No distress.   HENT:  Head: Normocephalic and atraumatic.  Eyes: Conjunctivae and EOM are normal.  Neck: Neck supple. No tracheal deviation present.  Cardiovascular: Normal rate.  Pulmonary/Chest: Effort normal. No respiratory distress.  Abdominal: There is tenderness in the right lower quadrant. There is no rebound, no guarding and no CVA tenderness.  Musculoskeletal: Normal range of motion. She exhibits edema.  R knee: Slight tenderness along medial joint line, no effusion. FROM. Neg varus/valgus. Internal rotation, McMurray pain, medial.  Neurological: She is alert and oriented to person, place, and time.  Skin: Skin is warm and dry.  Psychiatric: She has a  normal mood and affect. Her behavior is normal.  Nursing note and vitals reviewed.  Vitals:   09/15/17 1715  BP: 118/64  Pulse: 74  Resp: 16  Temp: 98.8 F (37.1 C)  TempSrc: Oral  SpO2: 98%  Weight: 148 lb (67.1 kg)  Height: 5\' 2"  (1.575 m)   Results for orders placed or performed in visit on 09/15/17  POCT CBC  Result Value Ref Range   WBC 8.0 4.6 - 10.2 K/uL   Lymph, poc 2.8 0.6 - 3.4   POC LYMPH PERCENT 34.5 10 - 50 %L   MID (cbc) 0.4 0 - 0.9   POC MID % 4.9 0 - 12 %M   POC Granulocyte 4.8 2 - 6.9   Granulocyte percent 60.6 37 - 80 %G   RBC 4.26 4.04 - 5.48 M/uL   Hemoglobin 13.4 12.2 - 16.2 g/dL   HCT, POC 39.8 37.7 - 47.9 %   MCV 93.5 80 - 97 fL   MCH, POC 31.6 (A) 27 - 31.2 pg   MCHC 33.8 31.8 - 35.4 g/dL   RDW, POC 13.2 %   Platelet Count, POC 294 142 - 424 K/uL   MPV 6.6 0 - 99.8 fL  POCT urinalysis dipstick  Result Value Ref Range   Color, UA yellow yellow   Clarity, UA clear clear   Glucose, UA negative negative mg/dL   Bilirubin, UA negative negative   Ketones, POC UA negative negative mg/dL   Spec Grav, UA 1.015 1.010 - 1.025   Blood, UA trace-intact (A) negative   pH, UA 6.5 5.0 - 8.0   Protein Ur, POC negative negative mg/dL   Urobilinogen, UA 0.2 0.2 or 1.0 E.U./dL   Nitrite, UA Negative Negative    Leukocytes, UA Trace (A) Negative  POCT Microscopic Urinalysis ([facility])  Result Value Ref Range   WBC,UR,HPF,POC None None WBC/hpf   RBC,UR,HPF,POC None None RBC/hpf   Bacteria None None, Too numerous to count   Mucus Absent Absent   Epithelial Cells, UR Per Microscopy None None, Too numerous to count cells/hpf  Dg Knee Complete 4 Views Right  Result Date: 09/15/2017 CLINICAL DATA:  Right hip pain for 2 months. EXAM: RIGHT KNEE - COMPLETE 4+ VIEW COMPARISON:  None. FINDINGS: Minimal degenerative changes in the medial compartment. The study is otherwise normal. IMPRESSION: Minimal degenerative changes in the medial compartment. Electronically Signed   By: Dorise Bullion III M.D   On: 09/15/2017 18:26        Assessment & Plan:    EVOLEHT HOVATTER is a 67 y.o. female Depression with anxiety - Plan: FLUoxetine (PROZAC) 20 MG tablet, clonazePAM (KLONOPIN) 0.5 MG tablet  - Overall stable, would still recommend continuing fluoxetine at this time, episodic Klonopin as needed. If she tapers off fluoxetine, advised to update me by my chart. Recheck 6 months to discuss further  Osteoporosis without current pathological fracture, unspecified osteoporosis type - Plan: Vitamin D, 25-hydroxy  -Vitamin D level recommended by her gynecologist. Plan for fasting labs and can have drawn at that time  Hyperlipidemia, unspecified hyperlipidemia type - Plan: Lipid panel, Comprehensive metabolic panel  -Plan for fasting lab visit.   History of prediabetes - Plan: Hemoglobin A1c  - Improved when checked previously. Repeat testing  Right knee pain, unspecified chronicity - Plan: DG Knee Complete 4 Views Right  -Slight arthritic changes seen on x-ray. Differential includes medial meniscal pathology/degenerative tear, but overall reassuring exam. Initial trial of Tylenol, then recheck next week at  time of abdominal pain follow-up to consider bracing  Constipation, unspecified constipation type Abdominal pain,  RLQ - Plan: POCT CBC, POCT urinalysis dipstick, POCT Microscopic Urinalysis ([facility])  -Suspect abdominal pain due to constipation. MiraLAX discussed. Reassuring CBC and urinalysis, no obvious signs of kidney stone at this time. Consider CT if not improving into next week to rule out nephrolithiasis. RTC precautions if worsening sooner  Peripheral edema - Plan: Ambulatory referral to Vascular Surgery  -Persistent peripheral edema but improves with elevation. prior evaluation reassuring. referral placed for gastric specialist to decide on other testing/treatment  Meds ordered this encounter  Medications  . FLUoxetine (PROZAC) 20 MG tablet    Sig: Take 1 tablet (20 mg total) by mouth daily.    Dispense:  90 tablet    Refill:  2    DX Code Needed  .  . clonazePAM (KLONOPIN) 0.5 MG tablet    Sig: Take 1 tablet (0.5 mg total) by mouth 2 (two) times daily as needed.    Dispense:  30 tablet    Refill:  0    This request is for a new prescription for a controlled substance as required by Federal/State law..   Patient Instructions   Follow up in 6 months to discuss klonopin and to discuss fluoxetine.  If you decide to come off fluoxetine, let me know.   I will check cholesterol on upcoming fasting labs.  Depending on readings, may need to discuss restarting statin.   For leg swelling, see information below, but I will refer you to vascular specialist to discuss those symptoms further.   See info on constipation. Try Miralax if stool softener like Colace is not helping. If any fever or worsening abdominal pain, return right away. Otherwise recheck next week as discussed.    Constipation, Adult Constipation is when a person has fewer bowel movements in a week than normal, has difficulty having a bowel movement, or has stools that are dry, hard, or larger than normal. Constipation may be caused by an underlying condition. It may become worse with age if a person takes certain medicines and does not  take in enough fluids. Follow these instructions at home: Eating and drinking   Eat foods that have a lot of fiber, such as fresh fruits and vegetables, whole grains, and beans.  Limit foods that are high in fat, low in fiber, or overly processed, such as french fries, hamburgers, cookies, candies, and soda.  Drink enough fluid to keep your urine clear or pale yellow. General instructions  Exercise regularly or as told by your health care provider.  Go to the restroom when you have the urge to go. Do not hold it in.  Take over-the-counter and prescription medicines only as told by your health care provider. These include any fiber supplements.  Practice pelvic floor retraining exercises, such as deep breathing while relaxing the lower abdomen and pelvic floor relaxation during bowel movements.  Watch your condition for any changes.  Keep all follow-up visits as told by your health care provider. This is important. Contact a health care provider if:  You have pain that gets worse.  You have a fever.  You do not have a bowel movement after 4 days.  You vomit.  You are not hungry.  You lose weight.  You are bleeding from the anus.  You have thin, pencil-like stools. Get help right away if:  You have a fever and your symptoms suddenly get worse.  You leak stool or  have blood in your stool.  Your abdomen is bloated.  You have severe pain in your abdomen.  You feel dizzy or you faint. This information is not intended to replace advice given to you by your health care provider. Make sure you discuss any questions you have with your health care provider. Document Released: 04/30/2004 Document Revised: 02/20/2016 Document Reviewed: 01/21/2016 Elsevier Interactive Patient Education  2018 [employer].   Abdominal Pain, Adult Abdominal pain can be caused by many things. Often, abdominal pain is not serious and it gets better with no treatment or by being treated at  home. However, sometimes abdominal pain is serious. Your health care provider will do a medical history and a physical exam to try to determine the cause of your abdominal pain. Follow these instructions at home:  Take over-the-counter and prescription medicines only as told by your health care provider. Do not take a laxative unless told by your health care provider.  Drink enough fluid to keep your urine clear or pale yellow.  Watch your condition for any changes.  Keep all follow-up visits as told by your health care provider. This is important. Contact a health care provider if:  Your abdominal pain changes or gets worse.  You are not hungry or you lose weight without trying.  You are constipated or have diarrhea for more than 2-3 days.  You have pain when you urinate or have a bowel movement.  Your abdominal pain wakes you up at night.  Your pain gets worse with meals, after eating, or with certain foods.  You are throwing up and cannot keep anything down.  You have a fever. Get help right away if:  Your pain does not go away as soon as your health care provider told you to expect.  You cannot stop throwing up.  Your pain is only in areas of the abdomen, such as the right side or the left lower portion of the abdomen.  You have bloody or black stools, or stools that look like tar.  You have severe pain, cramping, or bloating in your abdomen.  You have signs of dehydration, such as: ? Dark urine, very little urine, or no urine. ? Cracked lips. ? Dry mouth. ? Sunken eyes. ? Sleepiness. ? Weakness. This information is not intended to replace advice given to you by your health care provider. Make sure you discuss any questions you have with your health care provider. Document Released: 05/12/2005 Document Revised: 02/20/2016 Document Reviewed: 01/14/2016 Elsevier Interactive Patient Education  2018 [employer].    Peripheral Edema Peripheral edema is swelling  that is caused by a buildup of fluid. Peripheral edema most often affects the lower legs, ankles, and feet. It can also develop in the arms, hands, and face. The area of the body that has peripheral edema will look swollen. It may also feel heavy or warm. Your clothes may start to feel tight. Pressing on the area may make a temporary dent in your skin. You may not be able to move your arm or leg as much as usual. There are many causes of peripheral edema. It can be a complication of other diseases, such as congestive heart failure, kidney disease, or a problem with your blood circulation. It also can be a side effect of certain medicines. It often happens to women during pregnancy. Sometimes, the cause is not known. Treating the underlying condition is often the only treatment for peripheral edema. Follow these instructions at home: Pay attention to  any changes in your symptoms. Take these actions to help with your discomfort:  Raise (elevate) your legs while you are sitting or lying down.  Move around often to prevent stiffness and to lessen swelling. Do not sit or stand for long periods of time.  Wear support stockings as told by your health care provider.  Follow instructions from your health care provider about limiting salt (sodium) in your diet. Sometimes eating less salt can reduce swelling.  Take over-the-counter and prescription medicines only as told by your health care provider. Your health care provider may prescribe medicine to help your body get rid of excess water (diuretic).  Keep all follow-up visits as told by your health care provider. This is important.  Contact a health care provider if:  You have a fever.  Your edema starts suddenly or is getting worse, especially if you are pregnant or have a medical condition.  You have swelling in only one leg.  You have increased swelling and pain in your legs. Get help right away if:  You develop shortness of breath, especially  when you are lying down.  You have pain in your chest or abdomen.  You feel weak.  You faint. This information is not intended to replace advice given to you by your health care provider. Make sure you discuss any questions you have with your health care provider. Document Released: 09/09/2004 Document Revised: 01/05/2016 Document Reviewed: 02/12/2015 Elsevier Interactive Patient Education  2018 [employer].    IF you received an x-ray today, you will receive an invoice from [facility]. Please contact [facility] at [phone] with questions or concerns regarding your invoice.   IF you received labwork today, you will receive an invoice from [facility]. Please contact [facility] at [phone] with questions or concerns regarding your invoice.   Our billing staff will not be able to assist you with questions regarding bills from these companies.  You will be contacted with the lab results as soon as they are available. The fastest way to get your results is to activate your My Chart account. Instructions are located on the last page of this paperwork. If you have not heard from [place] regarding the results in 2 weeks, please contact this office.       I personally performed the services described in this documentation, which was scribed in my presence. The recorded information has been reviewed and considered for accuracy and completeness, addended by me as needed, and agree with information above.  Signed,   [name], MD [facility] at [facility].  09/17/17 11:38 AM

## 2017-09-15 NOTE — Patient Instructions (Addendum)
Follow up in 6 months to discuss klonopin and to discuss fluoxetine.  If you decide to come off fluoxetine, let me know.   I will check cholesterol on upcoming fasting labs.  Depending on readings, may need to discuss restarting statin.   For leg swelling, see information below, but I will refer you to vascular specialist to discuss those symptoms further.   See info on constipation. Try Miralax if stool softener like Colace is not helping. If any fever or worsening abdominal pain, return right away. Otherwise recheck next week as discussed.    Constipation, Adult Constipation is when a person has fewer bowel movements in a week than normal, has difficulty having a bowel movement, or has stools that are dry, hard, or larger than normal. Constipation may be caused by an underlying condition. It may become worse with age if a person takes certain medicines and does not take in enough fluids. Follow these instructions at home: Eating and drinking   Eat foods that have a lot of fiber, such as fresh fruits and vegetables, whole grains, and beans.  Limit foods that are high in fat, low in fiber, or overly processed, such as french fries, hamburgers, cookies, candies, and soda.  Drink enough fluid to keep your urine clear or pale yellow. General instructions  Exercise regularly or as told by your health care provider.  Go to the restroom when you have the urge to go. Do not hold it in.  Take over-the-counter and prescription medicines only as told by your health care provider. These include any fiber supplements.  Practice pelvic floor retraining exercises, such as deep breathing while relaxing the lower abdomen and pelvic floor relaxation during bowel movements.  Watch your condition for any changes.  Keep all follow-up visits as told by your health care provider. This is important. Contact a health care provider if:  You have pain that gets worse.  You have a fever.  You do not have  a bowel movement after 4 days.  You vomit.  You are not hungry.  You lose weight.  You are bleeding from the anus.  You have thin, pencil-like stools. Get help right away if:  You have a fever and your symptoms suddenly get worse.  You leak stool or have blood in your stool.  Your abdomen is bloated.  You have severe pain in your abdomen.  You feel dizzy or you faint. This information is not intended to replace advice given to you by your health care provider. Make sure you discuss any questions you have with your health care provider. Document Released: 04/30/2004 Document Revised: 02/20/2016 Document Reviewed: 01/21/2016 Elsevier Interactive Patient Education  2018 [employer].   Abdominal Pain, Adult Abdominal pain can be caused by many things. Often, abdominal pain is not serious and it gets better with no treatment or by being treated at home. However, sometimes abdominal pain is serious. Your health care provider will do a medical history and a physical exam to try to determine the cause of your abdominal pain. Follow these instructions at home:  Take over-the-counter and prescription medicines only as told by your health care provider. Do not take a laxative unless told by your health care provider.  Drink enough fluid to keep your urine clear or pale yellow.  Watch your condition for any changes.  Keep all follow-up visits as told by your health care provider. This is important. Contact a health care provider if:  Your abdominal pain changes or  gets worse.  You are not hungry or you lose weight without trying.  You are constipated or have diarrhea for more than 2-3 days.  You have pain when you urinate or have a bowel movement.  Your abdominal pain wakes you up at night.  Your pain gets worse with meals, after eating, or with certain foods.  You are throwing up and cannot keep anything down.  You have a fever. Get help right away if:  Your pain does  not go away as soon as your health care provider told you to expect.  You cannot stop throwing up.  Your pain is only in areas of the abdomen, such as the right side or the left lower portion of the abdomen.  You have bloody or black stools, or stools that look like tar.  You have severe pain, cramping, or bloating in your abdomen.  You have signs of dehydration, such as: ? Dark urine, very little urine, or no urine. ? Cracked lips. ? Dry mouth. ? Sunken eyes. ? Sleepiness. ? Weakness. This information is not intended to replace advice given to you by your health care provider. Make sure you discuss any questions you have with your health care provider. Document Released: 05/12/2005 Document Revised: 02/20/2016 Document Reviewed: 01/14/2016 Elsevier Interactive Patient Education  2018 [employer].    Peripheral Edema Peripheral edema is swelling that is caused by a buildup of fluid. Peripheral edema most often affects the lower legs, ankles, and feet. It can also develop in the arms, hands, and face. The area of the body that has peripheral edema will look swollen. It may also feel heavy or warm. Your clothes may start to feel tight. Pressing on the area may make a temporary dent in your skin. You may not be able to move your arm or leg as much as usual. There are many causes of peripheral edema. It can be a complication of other diseases, such as congestive heart failure, kidney disease, or a problem with your blood circulation. It also can be a side effect of certain medicines. It often happens to women during pregnancy. Sometimes, the cause is not known. Treating the underlying condition is often the only treatment for peripheral edema. Follow these instructions at home: Pay attention to any changes in your symptoms. Take these actions to help with your discomfort:  Raise (elevate) your legs while you are sitting or lying down.  Move around often to prevent stiffness and to lessen  swelling. Do not sit or stand for long periods of time.  Wear support stockings as told by your health care provider.  Follow instructions from your health care provider about limiting salt (sodium) in your diet. Sometimes eating less salt can reduce swelling.  Take over-the-counter and prescription medicines only as told by your health care provider. Your health care provider may prescribe medicine to help your body get rid of excess water (diuretic).  Keep all follow-up visits as told by your health care provider. This is important.  Contact a health care provider if:  You have a fever.  Your edema starts suddenly or is getting worse, especially if you are pregnant or have a medical condition.  You have swelling in only one leg.  You have increased swelling and pain in your legs. Get help right away if:  You develop shortness of breath, especially when you are lying down.  You have pain in your chest or abdomen.  You feel weak.  You faint. This information  is not intended to replace advice given to you by your health care provider. Make sure you discuss any questions you have with your health care provider. Document Released: 09/09/2004 Document Revised: 01/05/2016 Document Reviewed: 02/12/2015 Elsevier Interactive Patient Education  2018 [employer].    IF you received an x-ray today, you will receive an invoice from [facility]. Please contact [facility] at [phone] with questions or concerns regarding your invoice.   IF you received labwork today, you will receive an invoice from [facility]. Please contact [facility] at [phone] with questions or concerns regarding your invoice.   Our billing staff will not be able to assist you with questions regarding bills from these companies.  You will be contacted with the lab results as soon as they are available. The fastest way to get your results is to activate your My Chart account. Instructions are  located on the last page of this paperwork. If you have not heard from [place] regarding the results in 2 weeks, please contact this office.

## 2017-09-17 ENCOUNTER — Encounter: Payer: Self-pay | Admitting: Family Medicine

## 2017-09-24 ENCOUNTER — Ambulatory Visit: Payer: Self-pay

## 2017-09-24 DIAGNOSIS — M810 Age-related osteoporosis without current pathological fracture: Secondary | ICD-10-CM

## 2017-09-24 DIAGNOSIS — Z87898 Personal history of other specified conditions: Secondary | ICD-10-CM

## 2017-09-24 DIAGNOSIS — E785 Hyperlipidemia, unspecified: Secondary | ICD-10-CM

## 2017-09-26 ENCOUNTER — Ambulatory Visit: Payer: PRIVATE HEALTH INSURANCE | Admitting: Family Medicine

## 2017-09-26 LAB — COMPREHENSIVE METABOLIC PANEL
A/G RATIO: 2 (ref 1.2–2.2)
ALBUMIN: 4.7 g/dL (ref 3.6–4.8)
ALT: 16 IU/L (ref 0–32)
AST: 18 IU/L (ref 0–40)
Alkaline Phosphatase: 120 IU/L — ABNORMAL HIGH (ref 39–117)
BILIRUBIN TOTAL: 0.7 mg/dL (ref 0.0–1.2)
BUN / CREAT RATIO: 13 (ref 12–28)
BUN: 11 mg/dL (ref 8–27)
CHLORIDE: 104 mmol/L (ref 96–106)
CO2: 24 mmol/L (ref 20–29)
Calcium: 10.1 mg/dL (ref 8.7–10.3)
Creatinine, Ser: 0.84 mg/dL (ref 0.57–1.00)
GFR calc non Af Amer: 73 mL/min/{1.73_m2} (ref >59)
GFR, EST AFRICAN AMERICAN: 84 mL/min/{1.73_m2} (ref >59)
GLOBULIN, TOTAL: 2.3 g/dL (ref 1.5–4.5)
Glucose: 97 mg/dL (ref 65–99)
POTASSIUM: 5.3 mmol/L — AB (ref 3.5–5.2)
SODIUM: 145 mmol/L — AB (ref 134–144)
TOTAL PROTEIN: 7 g/dL (ref 6.0–8.5)

## 2017-09-26 LAB — LIPID PANEL
CHOL/HDL RATIO: 5.5 ratio — AB (ref 0.0–4.4)
Cholesterol, Total: 249 mg/dL — ABNORMAL HIGH (ref 100–199)
HDL: 45 mg/dL (ref >39)
LDL CALC: 174 mg/dL — AB (ref 0–99)
TRIGLYCERIDES: 148 mg/dL (ref 0–149)
VLDL CHOLESTEROL CAL: 30 mg/dL (ref 5–40)

## 2017-09-26 LAB — VITAMIN D 25 HYDROXY (VIT D DEFICIENCY, FRACTURES): Vit D, 25-Hydroxy: 37.4 ng/mL (ref 30.0–100.0)

## 2017-09-26 LAB — HEMOGLOBIN A1C
Est. average glucose Bld gHb Est-mCnc: 117 mg/dL
Hgb A1c MFr Bld: 5.7 % — ABNORMAL HIGH (ref 4.8–5.6)

## 2017-09-27 ENCOUNTER — Ambulatory Visit (INDEPENDENT_AMBULATORY_CARE_PROVIDER_SITE_OTHER): Payer: PRIVATE HEALTH INSURANCE

## 2017-09-27 ENCOUNTER — Encounter: Payer: Self-pay | Admitting: Family Medicine

## 2017-09-27 ENCOUNTER — Ambulatory Visit: Payer: PRIVATE HEALTH INSURANCE | Admitting: Family Medicine

## 2017-09-27 ENCOUNTER — Other Ambulatory Visit: Payer: Self-pay

## 2017-09-27 VITALS — BP 124/60 | HR 96 | Temp 98.1°F | Resp 18 | Ht 62.0 in | Wt 152.6 lb

## 2017-09-27 DIAGNOSIS — R109 Unspecified abdominal pain: Secondary | ICD-10-CM

## 2017-09-27 DIAGNOSIS — R198 Other specified symptoms and signs involving the digestive system and abdomen: Secondary | ICD-10-CM

## 2017-09-27 DIAGNOSIS — M25561 Pain in right knee: Secondary | ICD-10-CM | POA: Diagnosis not present

## 2017-09-27 DIAGNOSIS — R040 Epistaxis: Secondary | ICD-10-CM | POA: Diagnosis not present

## 2017-09-27 NOTE — Progress Notes (Signed)
Subjective:  By signing my name below, I, [name], attest that this documentation has been prepared under the direction and in the presence of [name], MD Electronically Signed: Ladene Artist, ED Scribe 09/27/2017 at 4:03 PM.   Patient ID: [name], female    DOB: Oct 16, 1950, 67 y.o.   MRN: [id number]  Chief Complaint  Patient presents with  . Abdominal Pain    x1 month, pt states pain comes and goes. Pt states pain is like a "nagging ache".  . Knee Pain    Pt states knee has been going on longer than the back pain. Pt states she thinks the pain is because the fall. Pt states it hurts to stand up and can't keep knee sitting in one spot for a long time.  . Follow-up   HPI [name] is a 67 y.o. female who presents to Primary Care at [facility]. Last seen 1/31. Here for f/u of concerns of that visit.  Abdominal Pain Suspected constipation cause. Trace blood on urine dip but neg micro. Normal CBC. Discussed Miralax and RTC precautions.  Per pt, she has been fairly regular but started experiencing constipation x 1 month with associated nagging, aching abdominal pain x 2 days ago. Pt did not try Miralax. States the following day after she was seen she had a large BM. She has been alternating large hard and loose/soft stools. Last BM was today; first part was hard and pebbly but then loose. Reports that she has been consuming a lot of fiber, salads, brain, prunes and has increased her water intake. Denies diarrhea, dysuria, urinary frequency.  Episodic Peripheral Edema See prior notes. Previous eval reassuring but referred to vascular to determine if other workup needed.  R Knee Pain Noticed after fall that occurred 4-5 wks prior to 1/31 visit. Initially improved then worsened prior 3 wks. Noted with standing from seated position or stretching knee. XR determined degenerative changes medially, no fracture. Recommended Tylenol.  Pt describes pain as "annoying",  exacerbated with sitting for 30+ mins. States her knee occasionally locks and swells at night. Reports that knee pain is not currently intolerable and does not desire to be referred to ortho or have an injection at this time. She ha sonyl taken 1 dose of Tylenol. Also reports that she is experiencing the same issues with her elbow and increased pain at night with stretching it but states it's always sore.  Nosebleeds Pt reports 4 nosebleeds within the past 6 wks with the last one being last wk. She has only noticed bleeding from the L nare and most nosebleeds have occurred during the day, however, she has had 1 at night. 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  Patient Active Problem List   Diagnosis Date Noted  . Cough 01/02/2013  . Anxiety state, unspecified 01/02/2013  . Breast cancer (DCIS), stage 0, Left, receptor +, dx 2012 09/08/2010   Past Medical History:  Diagnosis Date  . Allergy   . Anxiety   . Arthritis   . Asthma   . Bruises easily   . Cancer [facility])    double mastectomy  . Contact lens/glasses fitting   . Depression   . Heart murmur   . Hiatal hernia   . Kidney stone   . Osteoporosis   . Rectal bleeding    hemorroid  . Sinus drainage    Past  Surgical History:  Procedure Laterality Date  . APPENDECTOMY    . BREAST LUMPECTOMY W/ NEEDLE LOCALIZATION  10/05/2010   lumpectomy  . BREAST SURGERY    . MASTECTOMY W/ SENTINEL NODE BIOPSY  01/26/2011  . OVARIAN CYST SURGERY  1997, 2001, 2008  . SIMPLE MASTECTOMY  01/26/2011   Allergies  Allergen Reactions  . Elavil [Amitriptyline Hcl] Rash  . Sulfur Rash   Prior to Admission medications   Medication Sig Start Date End Date Taking? Authorizing Provider  calcium-vitamin D (OSCAL WITH D) 500-200 MG-UNIT per tablet Take 1 tablet by mouth daily.    [provider]  cetirizine (ZYRTEC) 10 MG tablet Take  10 mg by mouth daily as needed for allergies.     [provider]  clonazePAM (KLONOPIN) 0.5 MG tablet Take 1 tablet (0.5 mg total) by mouth 2 (two) times daily as needed. 09/15/17   [name], MD  FLUoxetine (PROZAC) 20 MG tablet Take 1 tablet (20 mg total) by mouth daily. 09/15/17   [name], MD  fluticasone (FLONASE) 50 MCG/ACT nasal spray Place 2 sprays into both nostrils daily. 06/17/16   [name], MD   Social History   Socioeconomic History  . Marital status: Married    Spouse name: Not on file  . Number of children: Not on file  . Years of education: Not on file  . Highest education level: Not on file  Social Needs  . Financial resource strain: Not on file  . Food insecurity - worry: Not on file  . Food insecurity - inability: Not on file  . Transportation needs - medical: Not on file  . Transportation needs - non-medical: Not on file  Occupational History  . Not on file  Tobacco Use  . Smoking status: Never Smoker  . Smokeless tobacco: Never Used  Substance and Sexual Activity  . Alcohol use: Yes    Alcohol/week: 0.0 oz    Comment: occasional  . Drug use: No  . Sexual activity: No  Other Topics Concern  . Not on file  Social History Narrative  . Not on file   Review of Systems  HENT: Positive for nosebleeds.   Gastrointestinal: Positive for abdominal pain and constipation. Negative for diarrhea.  Genitourinary: Negative for dysuria and frequency.  Musculoskeletal: Positive for arthralgias and joint swelling.  Allergic/Immunologic: Positive for environmental allergies.      Objective:   Physical Exam  Constitutional: She is oriented to person, place, and time. She appears well-developed and well-nourished. No distress.  HENT:  Head: Normocephalic and atraumatic.  Area of dried blood at L nasal septum  Eyes: Conjunctivae and EOM are normal.  Neck: Neck supple. No tracheal deviation present.  Cardiovascular: Normal rate.    Pulmonary/Chest: Effort normal. No respiratory distress.  Abdominal: Soft. There is tenderness in the suprapubic area and left lower quadrant. There is negative Murphy's sign.  Minimal LLQ tenderness, slight suprapubic tenderness. Neg Murphy's.  Musculoskeletal: Normal range of motion.  L knee: No effusion. Skin is intact. No erythema. Discomfort with flexion but full ROM. Tender over medial joint line. Neg McMurray, neg varus/valgus, neg Lachman. Patellar femoral joint nontender.  Neurological: She is alert and oriented to person, place, and time.  Skin: Skin is warm and dry.  Psychiatric: She has a normal mood and affect. Her behavior is normal.  Nursing note and vitals reviewed.  Vitals:   09/27/17 1542  BP: 124/60  Pulse: 96  Resp: 18  Temp:  98.1 F (36.7 C)  TempSrc: Oral  SpO2: 98%  Weight: 152 lb 9.6 oz (69.2 kg)  Height: 5\' 2"  (1.575 m)      Assessment & Plan:   HALEY FUERSTENBERG is a 67 y.o. female Alternating constipation and diarrhea - Plan: DG Abd 1 View Abdominal pain, unspecified abdominal location - Plan: DG Abd 1 View  - Suspected constipation with occasional looser stool. Increased stool burden noted on x-ray.  -Trial of over-the-counter MiraLAX or Colace, fluids, and RTC precautions if not improving or worsening symptoms. Consider abdominal/pelvic ultrasound if persistent   Nosebleed  -Likely related to whether/dry mucosa. Saline nasal spray, humidifier, other prevention measures discussed and RTC precautions if persistent or refer to ENT for evaluation  Right medial knee pain - Plan: Apply knee sleeve  -Noted after fall, x-ray without acute bony findings, some component of degenerative disease. Differential includes a degenerative meniscal tear. Options were discussed including possible orthopedic eval, injection, or advance imaging with MRI.  -Initially would like to try hinged knee sleeve, OA Web reaction brace placed for offloading, episodic Tylenol as needed,  then refer to orthopedics not improving in 2-4 weeks. RTC precautions if worse   No orders of the defined types were placed in this encounter.  Patient Instructions   Try saline spray 1- 2 times per day for the next 2 weeks, humidifier in the bedroom. If nosebleeds do not improve in next 2 weeks, I am happy tor refer you ear nose and throat.   If abdominal pain is not improving in next 2 weeks with use of Colace and Miralax as needed, would recommend abdominal and pelvic ultrasound. Return to the clinic or go to the nearest emergency room if any of your symptoms worsen or new symptoms occur.  Try the knee brace, occasional tylenol as needed for your knee pain. If not improving in next 2-4 weeks, let me know and I can refer you to orthopaedist.   Return to the clinic or go to the nearest emergency room if any of your symptoms worsen or new symptoms occur.  Knee Pain, Adult Knee pain in adults is common. It can be caused by many things, including:  Arthritis.  A fluid-filled sac (cyst) or growth in your knee.  An infection in your knee.  An injury that will not heal.  Damage, swelling, or irritation of the tissues that support your knee.  Knee pain is usually not a sign of a serious problem. The pain may go away on its own with time and rest. If it does not, a health care provider may order tests to find the cause of the pain. These may include:  Imaging tests, such as an X-ray, MRI, or ultrasound.  Joint aspiration. In this test, fluid is removed from the knee.  Arthroscopy. In this test, a lighted tube is inserted into knee and an image is projected onto a TV screen.  A biopsy. In this test, a sample of tissue is removed from the body and studied under a microscope.  Follow these instructions at home: Pay attention to any changes in your symptoms. Take these actions to relieve your pain. Activity  Rest your knee.  Do not do things that cause pain or make pain  worse.  Avoid high-impact activities or exercises, such as running, jumping rope, or doing jumping jacks. General instructions  Take over-the-counter and prescription medicines only as told by your health care provider.  Raise (elevate) your knee above the level of  your heart when you are sitting or lying down.  Sleep with a pillow under your knee.  If directed, apply ice to the knee: ? Put ice in a plastic bag. ? Place a towel between your skin and the bag. ? Leave the ice on for 20 minutes, 2-3 times a day.  Ask your health care provider if you should wear an elastic knee support.  Lose weight if you are overweight. Extra weight can put pressure on your knee.  Do not use any products that contain nicotine or tobacco, such as cigarettes and e-cigarettes. Smoking may slow the healing of any bone and joint problems that you may have. If you need help quitting, ask your health care provider. Contact a health care provider if:  Your knee pain continues, changes, or gets worse.  You have a fever along with knee pain.  Your knee buckles or locks up.  Your knee swells, and the swelling becomes worse. Get help right away if:  Your knee feels warm to the touch.  You cannot move your knee.  You have severe pain in your knee.  You have chest pain.  You have trouble breathing. Summary  Knee pain in adults is common. It can be caused by many things, including, arthritis, infection, cysts, or injury.  Knee pain is usually not a sign of a serious problem, but if it does not go away, a health care provider may perform tests to know the cause of the pain.  Pay attention to any changes in your symptoms. Relieve your pain with rest, medicines, light activity, and use of ice.  Get help if your pain continues or becomes very severe, or if your knee buckles or locks up, or if you have chest pain or trouble breathing. This information is not intended to replace advice given to you by your  health care provider. Make sure you discuss any questions you have with your health care provider. Document Released: 05/30/2007 Document Revised: 07/23/2016 Document Reviewed: 07/23/2016 Elsevier Interactive Patient Education  2018 [place].  Abdominal Pain, Adult Abdominal pain can be caused by many things. Often, abdominal pain is not serious and it gets better with no treatment or by being treated at home. However, sometimes abdominal pain is serious. Your health care provider will do a medical history and a physical exam to try to determine the cause of your abdominal pain. Follow these instructions at home:  Take over-the-counter and prescription medicines only as told by your health care provider. Do not take a laxative unless told by your health care provider.  Drink enough fluid to keep your urine clear or pale yellow.  Watch your condition for any changes.  Keep all follow-up visits as told by your health care provider. This is important. Contact a health care provider if:  Your abdominal pain changes or gets worse.  You are not hungry or you lose weight without trying.  You are constipated or have diarrhea for more than 2-3 days.  You have pain when you urinate or have a bowel movement.  Your abdominal pain wakes you up at night.  Your pain gets worse with meals, after eating, or with certain foods.  You are throwing up and cannot keep anything down.  You have a fever. Get help right away if:  Your pain does not go away as soon as your health care provider told you to expect.  You cannot stop throwing up.  Your pain is only in areas of the  abdomen, such as the right side or the left lower portion of the abdomen.  You have bloody or black stools, or stools that look like tar.  You have severe pain, cramping, or bloating in your abdomen.  You have signs of dehydration, such as: ? Dark urine, very little urine, or no urine. ? Cracked lips. ? Dry  mouth. ? Sunken eyes. ? Sleepiness. ? Weakness. This information is not intended to replace advice given to you by your health care provider. Make sure you discuss any questions you have with your health care provider. Document Released: 05/12/2005 Document Revised: 02/20/2016 Document Reviewed: 01/14/2016 Elsevier Interactive Patient Education  2018 [place], Adult A nosebleed is when blood comes out of the nose. Nosebleeds are common. Usually, they are not a sign of a serious condition. Nosebleeds can happen if a small blood vessel in your nose starts to bleed or if the lining of your nose (mucous membrane) cracks. They are commonly caused by:  Allergies.  Colds.  Picking your nose.  Blowing your nose too hard.  An injury from sticking an object into your nose or getting hit in the nose.  Dry or cold air.  Less common causes of nosebleeds include:  Toxic fumes.  Something abnormal in the nose or in the air-filled spaces in the bones of the face (sinuses).  Growths in the nose, such as polyps.  Medicines or conditions that cause blood to clot slowly.  Certain illnesses or procedures that irritate or dry out the nasal passages.  Follow these instructions at home: When you have a nosebleed:  Sit down and tilt your head slightly forward.  Use a clean towel or tissue to pinch your nostrils under the bony part of your nose. After 10 minutes, let go of your nose and see if bleeding starts again. Do not release pressure before that time. If there is still bleeding, repeat the pinching and holding for 10 minutes until the bleeding stops.  Do not place tissues or gauze in the nose to stop bleeding.  Avoid lying down and avoid tilting your head backward. That may make blood collect in the throat and cause gagging or coughing.  Use a nasal spray decongestant to help with a nosebleed as told by your health care provider.  Do not use petroleum jelly or mineral  oil in your nose. It can drip into your lungs. After a nosebleed:  Avoid blowing your nose or sniffing for a number of hours.  Avoid straining, lifting, or bending at the waist for several days. You may resume other normal activities as you are able.  Use saline spray or a humidifier as told by your health care provider.  Aspirinand blood thinners make bleeding more likely. If you are prescribed these medicines and you suffer from nosebleeds: ? Ask your health care provider if you should stop taking the medicines or if you should adjust the dose. ? Do not stop taking medicines that your health care provider has recommended unless told by your health care provider.  If your nosebleed was caused by dry mucous membranes, use over-the-counter saline nasal spray or gel. This will keep the mucous membranes moist and allow them to heal. If you must use a lubricant: ? Choose one that is water-soluble. ? Use only as much as you need and use it only as often as needed. ? Do not lie down until several hours after you use it. Contact a health care provider if:  You have a fever.  You get nosebleeds often or more often than usual.  You bruise very easily.  You have a nosebleed from having something stuck in your nose.  You have bleeding in your mouth.  You vomit or cough up brown material.  You have a nosebleed after you start a new medicine. Get help right away if:  You have a nosebleed after a fall or a head injury.  Your nosebleed does not go away after 20 minutes.  You feel dizzy or weak.  You have unusual bleeding from other parts of your body.  You have unusual bruising on other parts of your body.  You become sweaty.  You vomit blood. This information is not intended to replace advice given to you by your health care provider. Make sure you discuss any questions you have with your health care provider. Document Released: 05/12/2005 Document Revised: 04/01/2016 Document  Reviewed: 02/17/2016 Elsevier Interactive Patient Education  2018 [employer].   Constipation, Adult Constipation is when a person has fewer bowel movements in a week than normal, has difficulty having a bowel movement, or has stools that are dry, hard, or larger than normal. Constipation may be caused by an underlying condition. It may become worse with age if a person takes certain medicines and does not take in enough fluids. Follow these instructions at home: Eating and drinking   Eat foods that have a lot of fiber, such as fresh fruits and vegetables, whole grains, and beans.  Limit foods that are high in fat, low in fiber, or overly processed, such as french fries, hamburgers, cookies, candies, and soda.  Drink enough fluid to keep your urine clear or pale yellow. General instructions  Exercise regularly or as told by your health care provider.  Go to the restroom when you have the urge to go. Do not hold it in.  Take over-the-counter and prescription medicines only as told by your health care provider. These include any fiber supplements.  Practice pelvic floor retraining exercises, such as deep breathing while relaxing the lower abdomen and pelvic floor relaxation during bowel movements.  Watch your condition for any changes.  Keep all follow-up visits as told by your health care provider. This is important. Contact a health care provider if:  You have pain that gets worse.  You have a fever.  You do not have a bowel movement after 4 days.  You vomit.  You are not hungry.  You lose weight.  You are bleeding from the anus.  You have thin, pencil-like stools. Get help right away if:  You have a fever and your symptoms suddenly get worse.  You leak stool or have blood in your stool.  Your abdomen is bloated.  You have severe pain in your abdomen.  You feel dizzy or you faint. This information is not intended to replace advice given to you by your health  care provider. Make sure you discuss any questions you have with your health care provider. Document Released: 04/30/2004 Document Revised: 02/20/2016 Document Reviewed: 01/21/2016 Elsevier Interactive Patient Education  2018 [employer].   IF you received an x-ray today, you will receive an invoice from [facility]. Please contact [facility] at [phone] with questions or concerns regarding your invoice.   IF you received labwork today, you will receive an invoice from [facility]. Please contact [facility] at [phone] with questions or concerns regarding your invoice.   Our billing staff will not be able to assist you with  questions regarding bills from these companies.  You will be contacted with the lab results as soon as they are available. The fastest way to get your results is to activate your My Chart account. Instructions are located on the last page of this paperwork. If you have not heard from [place] regarding the results in 2 weeks, please contact this office.      I personally performed the services described in this documentation, which was scribed in my presence. The recorded information has been reviewed and considered for accuracy and completeness, addended by me as needed, and agree with information above.  Signed,   [name], MD [facility] [place].  09/30/17 8:33 PM

## 2017-09-27 NOTE — Patient Instructions (Addendum)
Try saline spray 1- 2 times per day for the next 2 weeks, humidifier in the bedroom. If nosebleeds do not improve in next 2 weeks, I am happy tor refer you ear nose and throat.   If abdominal pain is not improving in next 2 weeks with use of Colace and Miralax as needed, would recommend abdominal and pelvic ultrasound. Return to the clinic or go to the nearest emergency room if any of your symptoms worsen or new symptoms occur.  Try the knee brace, occasional tylenol as needed for your knee pain. If not improving in next 2-4 weeks, let me know and I can refer you to orthopaedist.   Return to the clinic or go to the nearest emergency room if any of your symptoms worsen or new symptoms occur.  Knee Pain, Adult Knee pain in adults is common. It can be caused by many things, including:  Arthritis.  A fluid-filled sac (cyst) or growth in your knee.  An infection in your knee.  An injury that will not heal.  Damage, swelling, or irritation of the tissues that support your knee.  Knee pain is usually not a sign of a serious problem. The pain may go away on its own with time and rest. If it does not, a health care provider may order tests to find the cause of the pain. These may include:  Imaging tests, such as an X-ray, MRI, or ultrasound.  Joint aspiration. In this test, fluid is removed from the knee.  Arthroscopy. In this test, a lighted tube is inserted into knee and an image is projected onto a TV screen.  A biopsy. In this test, a sample of tissue is removed from the body and studied under a microscope.  Follow these instructions at home: Pay attention to any changes in your symptoms. Take these actions to relieve your pain. Activity  Rest your knee.  Do not do things that cause pain or make pain worse.  Avoid high-impact activities or exercises, such as running, jumping rope, or doing jumping jacks. General instructions  Take over-the-counter and prescription medicines only  as told by your health care provider.  Raise (elevate) your knee above the level of your heart when you are sitting or lying down.  Sleep with a pillow under your knee.  If directed, apply ice to the knee: ? Put ice in a plastic bag. ? Place a towel between your skin and the bag. ? Leave the ice on for 20 minutes, 2-3 times a day.  Ask your health care provider if you should wear an elastic knee support.  Lose weight if you are overweight. Extra weight can put pressure on your knee.  Do not use any products that contain nicotine or tobacco, such as cigarettes and e-cigarettes. Smoking may slow the healing of any bone and joint problems that you may have. If you need help quitting, ask your health care provider. Contact a health care provider if:  Your knee pain continues, changes, or gets worse.  You have a fever along with knee pain.  Your knee buckles or locks up.  Your knee swells, and the swelling becomes worse. Get help right away if:  Your knee feels warm to the touch.  You cannot move your knee.  You have severe pain in your knee.  You have chest pain.  You have trouble breathing. Summary  Knee pain in adults is common. It can be caused by many things, including, arthritis, infection, cysts, or injury.  Knee pain is usually not a sign of a serious problem, but if it does not go away, a health care provider may perform tests to know the cause of the pain.  Pay attention to any changes in your symptoms. Relieve your pain with rest, medicines, light activity, and use of ice.  Get help if your pain continues or becomes very severe, or if your knee buckles or locks up, or if you have chest pain or trouble breathing. This information is not intended to replace advice given to you by your health care provider. Make sure you discuss any questions you have with your health care provider. Document Released: 05/30/2007 Document Revised: 07/23/2016 Document Reviewed:  07/23/2016 Elsevier Interactive Patient Education  2018 [place].  Abdominal Pain, Adult Abdominal pain can be caused by many things. Often, abdominal pain is not serious and it gets better with no treatment or by being treated at home. However, sometimes abdominal pain is serious. Your health care provider will do a medical history and a physical exam to try to determine the cause of your abdominal pain. Follow these instructions at home:  Take over-the-counter and prescription medicines only as told by your health care provider. Do not take a laxative unless told by your health care provider.  Drink enough fluid to keep your urine clear or pale yellow.  Watch your condition for any changes.  Keep all follow-up visits as told by your health care provider. This is important. Contact a health care provider if:  Your abdominal pain changes or gets worse.  You are not hungry or you lose weight without trying.  You are constipated or have diarrhea for more than 2-3 days.  You have pain when you urinate or have a bowel movement.  Your abdominal pain wakes you up at night.  Your pain gets worse with meals, after eating, or with certain foods.  You are throwing up and cannot keep anything down.  You have a fever. Get help right away if:  Your pain does not go away as soon as your health care provider told you to expect.  You cannot stop throwing up.  Your pain is only in areas of the abdomen, such as the right side or the left lower portion of the abdomen.  You have bloody or black stools, or stools that look like tar.  You have severe pain, cramping, or bloating in your abdomen.  You have signs of dehydration, such as: ? Dark urine, very little urine, or no urine. ? Cracked lips. ? Dry mouth. ? Sunken eyes. ? Sleepiness. ? Weakness. This information is not intended to replace advice given to you by your health care provider. Make sure you discuss any questions you  have with your health care provider. Document Released: 05/12/2005 Document Revised: 02/20/2016 Document Reviewed: 01/14/2016 Elsevier Interactive Patient Education  2018 [place], Adult A nosebleed is when blood comes out of the nose. Nosebleeds are common. Usually, they are not a sign of a serious condition. Nosebleeds can happen if a small blood vessel in your nose starts to bleed or if the lining of your nose (mucous membrane) cracks. They are commonly caused by:  Allergies.  Colds.  Picking your nose.  Blowing your nose too hard.  An injury from sticking an object into your nose or getting hit in the nose.  Dry or cold air.  Less common causes of nosebleeds include:  Toxic fumes.  Something abnormal in the nose or  in the air-filled spaces in the bones of the face (sinuses).  Growths in the nose, such as polyps.  Medicines or conditions that cause blood to clot slowly.  Certain illnesses or procedures that irritate or dry out the nasal passages.  Follow these instructions at home: When you have a nosebleed:  Sit down and tilt your head slightly forward.  Use a clean towel or tissue to pinch your nostrils under the bony part of your nose. After 10 minutes, let go of your nose and see if bleeding starts again. Do not release pressure before that time. If there is still bleeding, repeat the pinching and holding for 10 minutes until the bleeding stops.  Do not place tissues or gauze in the nose to stop bleeding.  Avoid lying down and avoid tilting your head backward. That may make blood collect in the throat and cause gagging or coughing.  Use a nasal spray decongestant to help with a nosebleed as told by your health care provider.  Do not use petroleum jelly or mineral oil in your nose. It can drip into your lungs. After a nosebleed:  Avoid blowing your nose or sniffing for a number of hours.  Avoid straining, lifting, or bending at the waist for  several days. You may resume other normal activities as you are able.  Use saline spray or a humidifier as told by your health care provider.  Aspirinand blood thinners make bleeding more likely. If you are prescribed these medicines and you suffer from nosebleeds: ? Ask your health care provider if you should stop taking the medicines or if you should adjust the dose. ? Do not stop taking medicines that your health care provider has recommended unless told by your health care provider.  If your nosebleed was caused by dry mucous membranes, use over-the-counter saline nasal spray or gel. This will keep the mucous membranes moist and allow them to heal. If you must use a lubricant: ? Choose one that is water-soluble. ? Use only as much as you need and use it only as often as needed. ? Do not lie down until several hours after you use it. Contact a health care provider if:  You have a fever.  You get nosebleeds often or more often than usual.  You bruise very easily.  You have a nosebleed from having something stuck in your nose.  You have bleeding in your mouth.  You vomit or cough up brown material.  You have a nosebleed after you start a new medicine. Get help right away if:  You have a nosebleed after a fall or a head injury.  Your nosebleed does not go away after 20 minutes.  You feel dizzy or weak.  You have unusual bleeding from other parts of your body.  You have unusual bruising on other parts of your body.  You become sweaty.  You vomit blood. This information is not intended to replace advice given to you by your health care provider. Make sure you discuss any questions you have with your health care provider. Document Released: 05/12/2005 Document Revised: 04/01/2016 Document Reviewed: 02/17/2016 Elsevier Interactive Patient Education  2018 [employer].   Constipation, Adult Constipation is when a person has fewer bowel movements in a week than normal, has  difficulty having a bowel movement, or has stools that are dry, hard, or larger than normal. Constipation may be caused by an underlying condition. It may become worse with age if a person takes certain medicines and  does not take in enough fluids. Follow these instructions at home: Eating and drinking   Eat foods that have a lot of fiber, such as fresh fruits and vegetables, whole grains, and beans.  Limit foods that are high in fat, low in fiber, or overly processed, such as french fries, hamburgers, cookies, candies, and soda.  Drink enough fluid to keep your urine clear or pale yellow. General instructions  Exercise regularly or as told by your health care provider.  Go to the restroom when you have the urge to go. Do not hold it in.  Take over-the-counter and prescription medicines only as told by your health care provider. These include any fiber supplements.  Practice pelvic floor retraining exercises, such as deep breathing while relaxing the lower abdomen and pelvic floor relaxation during bowel movements.  Watch your condition for any changes.  Keep all follow-up visits as told by your health care provider. This is important. Contact a health care provider if:  You have pain that gets worse.  You have a fever.  You do not have a bowel movement after 4 days.  You vomit.  You are not hungry.  You lose weight.  You are bleeding from the anus.  You have thin, pencil-like stools. Get help right away if:  You have a fever and your symptoms suddenly get worse.  You leak stool or have blood in your stool.  Your abdomen is bloated.  You have severe pain in your abdomen.  You feel dizzy or you faint. This information is not intended to replace advice given to you by your health care provider. Make sure you discuss any questions you have with your health care provider. Document Released: 04/30/2004 Document Revised: 02/20/2016 Document Reviewed: 01/21/2016 Elsevier  Interactive Patient Education  2018 [employer].   IF you received an x-ray today, you will receive an invoice from [facility]. Please contact [facility] at [phone] with questions or concerns regarding your invoice.   IF you received labwork today, you will receive an invoice from [facility]. Please contact [facility] at [phone] with questions or concerns regarding your invoice.   Our billing staff will not be able to assist you with questions regarding bills from these companies.  You will be contacted with the lab results as soon as they are available. The fastest way to get your results is to activate your My Chart account. Instructions are located on the last page of this paperwork. If you have not heard from [place] regarding the results in 2 weeks, please contact this office.

## 2017-09-28 ENCOUNTER — Encounter: Payer: Self-pay | Admitting: Family Medicine

## 2017-09-29 ENCOUNTER — Other Ambulatory Visit: Payer: Self-pay | Admitting: Family Medicine

## 2017-09-29 DIAGNOSIS — E870 Hyperosmolality and hypernatremia: Secondary | ICD-10-CM

## 2017-09-29 DIAGNOSIS — R748 Abnormal levels of other serum enzymes: Secondary | ICD-10-CM

## 2017-09-30 ENCOUNTER — Encounter: Payer: Self-pay | Admitting: Vascular Surgery

## 2017-09-30 ENCOUNTER — Other Ambulatory Visit: Payer: Self-pay

## 2017-09-30 DIAGNOSIS — R600 Localized edema: Secondary | ICD-10-CM

## 2017-11-03 ENCOUNTER — Other Ambulatory Visit: Payer: Self-pay | Admitting: Family Medicine

## 2017-11-03 DIAGNOSIS — F418 Other specified anxiety disorders: Secondary | ICD-10-CM

## 2017-11-04 MED ORDER — CLONAZEPAM 0.5 MG PO TABS: 0.5000 mg | ORAL_TABLET | Freq: Two times a day (BID) | ORAL | 0 refills | Status: DC | PRN

## 2017-11-04 NOTE — Telephone Encounter (Signed)
Clonazepam refilled.  Initially printed, then sent electronically.  Will shred printed version.

## 2017-11-18 ENCOUNTER — Ambulatory Visit (INDEPENDENT_AMBULATORY_CARE_PROVIDER_SITE_OTHER): Payer: PRIVATE HEALTH INSURANCE | Admitting: Vascular Surgery

## 2017-11-18 ENCOUNTER — Ambulatory Visit (HOSPITAL_COMMUNITY)
Admission: RE | Admit: 2017-11-18 | Discharge: 2017-11-18 | Disposition: A | Discharge status: Home / Self Care | Payer: PRIVATE HEALTH INSURANCE | Source: Ambulatory Visit | Attending: Vascular Surgery | Admitting: Vascular Surgery

## 2017-11-18 ENCOUNTER — Encounter: Payer: Self-pay | Admitting: Vascular Surgery

## 2017-11-18 ENCOUNTER — Other Ambulatory Visit: Payer: Self-pay

## 2017-11-18 VITALS — BP 110/70 | HR 66 | Resp 18 | Ht 62.0 in | Wt 149.7 lb

## 2017-11-18 DIAGNOSIS — M7989 Other specified soft tissue disorders: Secondary | ICD-10-CM | POA: Diagnosis not present

## 2017-11-18 DIAGNOSIS — I872 Venous insufficiency (chronic) (peripheral): Secondary | ICD-10-CM | POA: Insufficient documentation

## 2017-11-18 DIAGNOSIS — R600 Localized edema: Secondary | ICD-10-CM

## 2017-11-18 DIAGNOSIS — Z853 Personal history of malignant neoplasm of breast: Secondary | ICD-10-CM | POA: Insufficient documentation

## 2017-11-18 NOTE — Progress Notes (Signed)
Patient ID: [name], female   DOB: [birth year], 67 y.o.   MRN: [id number]  Reason for Consult: New Patient (Initial Visit) (eval edema )   Referred by [name], MD  Subjective:     HPI:  [name] is a 67 y.o. female presents for evaluation of bilateral lower extremity swelling.  She has a history of breast cancer in 2012 is undergone bilateral mastectomies as well as subsequent augmentation.  She does not have any bilateral upper extremity swelling.  Her complaints today are swelling after being on her feet which she does work on her feet daily.  She continues to walk wears tight socks but does not wear compression.  She does not have any history of DVT or lower extremity surgery.  She does not have any varicose veins does have some minimal spider veins on the left that she says do not give her any issue.  Her leg swelling completely resolves when she is recumbent and elevates her legs.  She does not notice any leg swelling in the morning.  She does not have any skin changes or ulceration.  She has never had any vascular disease in the past.  She states that she has gained approximately 30 pounds over the past 10 years.  Past Medical History:  Diagnosis Date  . Allergy   . Anxiety   . Arthritis   . Asthma   . Bruises easily   . Cancer [facility])    double mastectomy  . Contact lens/glasses fitting   . Depression   . Heart murmur   . Hiatal hernia   . Kidney stone   . Osteoporosis   . Rectal bleeding    hemorroid  . Sinus drainage    Family History  Problem Relation Age of Onset  . Hypertension Mother   . Heart Problems Mother   . Hyperlipidemia Mother   . Hypertension Father   . Heart failure Paternal Grandmother   . Heart failure Paternal Grandfather   . Cancer Brother   . Cancer Sister   . Diabetes Sister    Past Surgical History:  Procedure Laterality Date  . APPENDECTOMY    . BREAST LUMPECTOMY W/ NEEDLE LOCALIZATION  10/05/2010   lumpectomy  .  BREAST SURGERY    . MASTECTOMY W/ SENTINEL NODE BIOPSY  01/26/2011  . OVARIAN CYST SURGERY  1997, 2001, 2008  . SIMPLE MASTECTOMY  01/26/2011    Short Social History:  Social History   Tobacco Use  . Smoking status: Never Smoker  . Smokeless tobacco: Never Used  Substance Use Topics  . Alcohol use: Yes    Alcohol/week: 0.0 oz    Comment: occasional    Allergies  Allergen Reactions  . Elavil [Amitriptyline Hcl] Rash  . Sulfur Rash    Current Outpatient Medications  Medication Sig Dispense Refill  . calcium-vitamin D (OSCAL WITH D) 500-200 MG-UNIT per tablet Take 1 tablet by mouth daily.    . cetirizine (ZYRTEC) 10 MG tablet Take 10 mg by mouth daily as needed for allergies.     . clonazePAM (KLONOPIN) 0.5 MG tablet Take 1 tablet (0.5 mg total) by mouth 2 (two) times daily as needed. 30 tablet 0  . FLUoxetine (PROZAC) 20 MG tablet Take 1 tablet (20 mg total) by mouth daily. 90 tablet 2  . fluticasone (FLONASE) 50 MCG/ACT nasal spray Place 2 sprays into both nostrils daily. 16 g 6   No current facility-administered medications for this visit.  Review of Systems  Constitutional:  Constitutional negative. HENT: HENT negative.  Eyes: Eyes negative.  Respiratory: Respiratory negative.  Cardiovascular: Positive for irregular heartbeat and leg swelling.  GI: Gastrointestinal negative.  Musculoskeletal: Musculoskeletal negative.  Skin: Skin negative.  Neurological: Neurological negative. Hematologic: Hematologic/lymphatic negative.  Psychiatric: Psychiatric negative.        Objective:  Objective   Vitals:   11/18/17 1026  BP: 110/70  Pulse: 66  Resp: 18  SpO2: 95%  Weight: 149 lb 11.2 oz (67.9 kg)  Height: 5\' 2"  (1.575 m)   Body mass index is 27.38 kg/m.  Physical Exam  Constitutional: She is oriented to person, place, and time. She appears well-developed.  HENT:  Head: Normocephalic.  Eyes: Pupils are equal, round, and reactive to light.  Neck: Normal  range of motion. Neck supple.  Cardiovascular: Normal rate.  Pulses:      Radial pulses are 2+ on the right side, and 2+ on the left side.       Popliteal pulses are 2+ on the right side, and 2+ on the left side.       Dorsalis pedis pulses are 2+ on the right side, and 2+ on the left side.       Posterior tibial pulses are 2+ on the right side, and 2+ on the left side.  Pulmonary/Chest: Effort normal.  Abdominal: Soft. She exhibits no mass.  Musculoskeletal: Normal range of motion.  Trace bilateral pitting edema ble  Neurological: She is alert and oriented to person, place, and time.  Skin: Skin is warm.  Psychiatric: Her behavior is normal. Judgment and thought content normal.    Data: I have independently interpreted her lower extremity reflux study which demonstrates reflux in her bilateral common femoral veins with saphenofemoral junction 0.68 on the right 0.63 in the left but normal appearing vein in the greater saphenous vein throughout the rest of the leg     Assessment/Plan:     67 year old female presents with bilateral lower extremity swelling which is multifactorial.  From a vein standpoint there is no necessity of intervention at this time.  I counseled her on continued exercise and wearing compression stockings.  We discussed specifically walking in the water exercises which can be very beneficial for patients with leg swelling.  We discussed that wearing compression stockings to at least knee-high when she is out of bed and on her feet would be very helpful and to elevate her legs when she is recumbent.  We have also discussed weight loss which would give her possibly some improvement.  She wants to see a cardiologist based on some irregular heart  beats and also a history of chest pain but she does not actively have any chest pain.  She can follow-up here on a as needed basis.     Waynetta Sandy MD [facility]

## 2017-11-22 ENCOUNTER — Encounter: Payer: Self-pay | Admitting: Family Medicine

## 2017-11-23 ENCOUNTER — Encounter: Payer: Self-pay | Admitting: Physician Assistant

## 2017-12-15 ENCOUNTER — Ambulatory Visit: Payer: PRIVATE HEALTH INSURANCE | Admitting: Cardiovascular Disease

## 2017-12-15 ENCOUNTER — Encounter: Payer: Self-pay | Admitting: Cardiovascular Disease

## 2017-12-15 ENCOUNTER — Encounter

## 2017-12-15 VITALS — BP 124/62 | HR 60 | Ht 62.0 in | Wt 149.8 lb

## 2017-12-15 DIAGNOSIS — Z01812 Encounter for preprocedural laboratory examination: Secondary | ICD-10-CM | POA: Diagnosis not present

## 2017-12-15 DIAGNOSIS — E7800 Pure hypercholesterolemia, unspecified: Secondary | ICD-10-CM | POA: Diagnosis not present

## 2017-12-15 DIAGNOSIS — R0789 Other chest pain: Secondary | ICD-10-CM

## 2017-12-15 DIAGNOSIS — I872 Venous insufficiency (chronic) (peripheral): Secondary | ICD-10-CM | POA: Diagnosis not present

## 2017-12-15 MED ORDER — METOPROLOL TARTRATE 50 MG PO TABS: ORAL_TABLET | ORAL | 0 refills | Status: DC

## 2017-12-15 NOTE — Progress Notes (Signed)
Cardiology Office Note    Date:  12/17/2017   ID:  [name], DOB 06-Jun-1951, MRN [id number]  PCP:  [name], MD  Cardiologist:  Shelva Majestic, MD   Chief Complaint  Patient presents with  . Chest Pain   New Cardiology evaluation; self referred  History of Present Illness:  [name] is a 67 y.o. female who present to the office today being self-referred for evaluation of an chin discomfort.  She was given my recommendation by Candelaria Stagers.  [name] is a 67 year old divorced female who was told of having a possible heart murmur as a child and also had childhood asthma.  He has a history of breast CA and in 2012 underwent double mastectomy.  She subsequently underwent gel implants.  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  Her new job has created increased work-related stress.  Recently, she has noticed a light pain sensation in her jaw.  She has noticed some mild chest tightness extending to her back which seems to be more stress mediated.  It also seems to be related to some increased anxiety.  She denies any chest pain with exercise.  She admits to recent increased belching which seems to improve some of her jaw discomfort.  She was told of having a hiatal hernia.  She does not routinely exercise but she walks the hallways at work.  He has had some issues with bilateral lower extremity swelling which was felt to be multifactorial.  She saw Dr. Anice Paganini and underwent LE doppler imaging.  Is no evidence for DVT.  He did have abnormal reflux times in the common femoral vein.  Some chronic venous insufficiency in the deep venous system of the left.  There was no need for intervention but compression stockings were recommended.  Because of recurrent chest and jaw symptomatology, she presents now for cardiology evaluation.  Past Medical History:  Diagnosis Date  . Allergy   . Anxiety   . Arthritis   . Asthma   .  Bruises easily   . Cancer [facility])    double mastectomy  . Contact lens/glasses fitting   . Depression   . Heart murmur   . Hiatal hernia   . Kidney stone   . Osteoporosis   . Rectal bleeding    hemorroid  . Sinus drainage     Past Surgical History:  Procedure Laterality Date  . APPENDECTOMY    . BREAST LUMPECTOMY W/ NEEDLE LOCALIZATION  10/05/2010   lumpectomy  . BREAST SURGERY    . MASTECTOMY W/ SENTINEL NODE BIOPSY  01/26/2011  . OVARIAN CYST SURGERY  1997, 2001, 2008  . SIMPLE MASTECTOMY  01/26/2011    Current Medications: Outpatient Medications Prior to Visit  Medication Sig Dispense Refill  . calcium-vitamin D (OSCAL WITH D) 500-200 MG-UNIT per tablet Take 1 tablet by mouth daily.    . cetirizine (ZYRTEC) 10 MG tablet Take 10 mg by mouth daily as needed for allergies.     . clonazePAM (KLONOPIN) 0.5 MG tablet Take 1 tablet (0.5 mg total) by mouth 2 (two) times daily as needed. 30 tablet 0  . FLUoxetine (PROZAC) 20 MG tablet Take 1 tablet (20 mg total) by mouth daily. 90 tablet 2  . fluticasone (FLONASE) 50 MCG/ACT nasal spray Place 2 sprays into both nostrils daily. 16 g 6   No facility-administered medications prior to visit.      Allergies:  Elavil [amitriptyline hcl] and Sulfur   Social History   Socioeconomic History  . Marital status: Married    Spouse name: Not on file  . Number of children: Not on file  . Years of education: Not on file  . Highest education level: Not on file  Occupational History  . Not on file  Social Needs  . Financial resource strain: Not on file  . Food insecurity:    Worry: Not on file    Inability: Not on file  . Transportation needs:    Medical: Not on file    Non-medical: Not on file  Tobacco Use  . Smoking status: Never Smoker  . Smokeless tobacco: Never Used  Substance and Sexual Activity  . Alcohol use: Yes    Alcohol/week: 0.0 oz    Comment: occasional  . Drug use: No  . Sexual activity: Never  Lifestyle  .  Physical activity:    Days per week: Not on file    Minutes per session: Not on file  . Stress: Not on file  Relationships  . Social connections:    Talks on phone: Not on file    Gets together: Not on file    Attends religious service: Not on file    Active member of club or organization: Not on file    Attends meetings of clubs or organizations: Not on file    Relationship status: Not on file  Other Topics Concern  . Not on file  Social History Narrative  . Not on file    Social history is notable that she is divorced from her second marriage.  She was married initially at a young age and then divorced.  She was single for 20 years.  Her second marriage was of 6 years duration.  She is divorced for 4 years.  Agitated from [employer] in music.  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  Family History:  The patient's family history includes Cancer in her brother and sister; Diabetes in her sister; Heart Problems in her mother; Heart failure in her paternal grandfather and paternal grandmother; Hyperlipidemia in her mother; Hypertension in her father and mother.  Parents lived to an old age with her mother dying at 54 and her father dying at 72.  One brother died at age 23 with cancer and had diabetes.  One living brother and one living sister.  ROS General: Negative; No fevers, chills, or night sweats;  HEENT: Negative; No changes in vision or hearing, sinus congestion, difficulty swallowing Pulmonary: Negative; No cough, wheezing, shortness of breath, hemoptysis Cardiovascular: Negative; No chest pain, presyncope, syncope, palpitations GI: Positive for hiatal hernia GU: Negative; No dysuria, hematuria, or difficulty voiding Musculoskeletal: Negative; no myalgias, joint pain, or weakness Hematologic/Oncology:   estrogen receptor positive breast cancer Endocrine:  Negative; no heat/cold intolerance; no diabetes Neuro: Negative; no changes in balance, headaches Skin: Negative; No rashes or skin lesions Psychiatric: Negative; No behavioral problems, depression Sleep: Negative; No snoring, daytime sleepiness, hypersomnolence, bruxism, restless legs, hypnogognic hallucinations, no cataplexy Other comprehensive 14 point system review is negative.   PHYSICAL EXAM:   VS:  BP 124/62   Pulse 60   Ht 5' 2" (1.575 m)   Wt 149 lb 12.8 oz (67.9 kg)   LMP 09/10/2006   BMI 27.40 kg/m     Repeat blood pressure  by me was 138/70 in the sitting position and 130/70 in the standing position  Wt Readings from Last 3 Encounters:  12/15/17 149 lb 12.8 oz (67.9 kg)  11/18/17 149 lb 11.2 oz (67.9 kg)  09/27/17 152 lb 9.6 oz (69.2 kg)    General: Alert, oriented, no distress.  Skin: normal turgor, no rashes, warm and dry HEENT: Normocephalic, atraumatic. Pupils equal round and reactive to light; sclera anicteric; extraocular muscles intact; Fundi without hemorrhages or exudates Nose without nasal septal hypertrophy Mouth/Parynx benign; Mallinpatti scale 3 Neck: No JVD, no carotid bruits; normal carotid upstroke Lungs: clear to ausculatation and percussion; no wheezing or rales Chest wall: without tenderness to palpitation Heart: PMI not displaced, RRR, s1 s2 normal, 1/6 systolic murmur, no diastolic murmur, no rubs, gallops, thrills, or heaves Abdomen: soft, nontender; no hepatosplenomehaly, BS+; abdominal aorta nontender and not dilated by palpation. Back: no CVA tenderness Pulses 2+ Musculoskeletal: full range of motion, normal strength, no joint deformities Extremities: no clubbing cyanosis or edema, Homan's sign negative  Neurologic: grossly nonfocal; Cranial nerves grossly wnl Psychologic: Normal mood and affect   Studies/Labs Reviewed:   EKG:  EKG is ordered today.  ECG (independently read by me): Sinus rhythm at 66 bpm.  No ectopy.  Normal intervals.   No ST segment changes.  Recent Labs: BMP Latest Ref Rng & Units 09/24/2017 12/30/2016 12/27/2016  Glucose 65 - 99 mg/dL 97 - 80  BUN 8 - 27 mg/dL 11 - 14  Creatinine 0.57 - 1.00 mg/dL 0.84 - 0.77  BUN/Creat Ratio 12 - 28 13 - 18  Sodium 134 - 144 mmol/L 145(H) - 136  Potassium 3.5 - 5.2 mmol/L 5.3(H) 5.0 7.1(>)  Chloride 96 - 106 mmol/L 104 - 95(L)  CO2 20 - 29 mmol/L 24 - 26  Calcium 8.7 - 10.3 mg/dL 10.1 - 9.9     Hepatic Function Latest Ref Rng & Units 09/24/2017 12/27/2016 07/22/2016  Total Protein 6.0 - 8.5 g/dL 7.0 7.0 6.8  Albumin 3.6 - 4.8 g/dL 4.7 4.8 4.6  AST 0 - 40 IU/L _0 ALT 0 - 32 IU/L _1 Alk Phosphatase 39 - 117 IU/L 120(H) 112 115  Total Bilirubin 0.0 - 1.2 mg/dL 0.7 0.8 0.8    CBC Latest Ref Rng & Units 09/15/2017 05/21/2015 05/06/2014  WBC 4.6 - 10.2 K/uL 8.0 6.7 6.7  Hemoglobin 12.2 - 16.2 g/dL 13.4 13.8 13.2  Hematocrit 37.7 - 47.9 % 39.8 41.3 39.6  Platelets 150 - 400 K/uL - 259 305   Lab Results  Component Value Date   MCV 93.5 09/15/2017   MCV 92.8 05/21/2015   MCV 91.5 05/06/2014   Lab Results  Component Value Date   TSH 1.410 12/27/2016   Lab Results  Component Value Date   HGBA1C 5.7 (H) 09/24/2017     BNP No results found for: BNP  ProBNP No results found for: PROBNP   Lipid Panel     Component Value Date/Time   CHOL 249 (H) 09/24/2017 1045   TRIG 148 09/24/2017 1045   HDL 45 09/24/2017 1045   CHOLHDL 5.5 (H) 09/24/2017 1045   CHOLHDL 3.9 01/17/2016 0937   VLDL 37 (H) 01/17/2016 0937   LDLCALC 174 (H) 09/24/2017 1045     RADIOLOGY: No results found.   Additional studies/ records that were reviewed today include:  Reviewed records from Dr. Cindee Lame.  I also reviewed her vascular surgery evaluation by Dr. Donzetta Matters.    ASSESSMENT:  1. Atypical chest pain   2. Pre-procedure lab exam   3. Pure hypercholesterolemia   4. Venous insufficiency of both lower extremities     PLAN:  [name] very pleasant  67 year old female denies any prior history of significant cardiovascular disease.  She was told of having a faint heart murmur as a child.  On exam there is a faint 1/6 systolic murmur.  She has experienced episodes of jaw discomfort which seemed to improve with belching and also has noticed some vague chest fullness and at times has noted this in her back.  On all occasions these have occurred non-exertionally.  Admits to increase in work-related stress which seems to play a role in symptom development.  He has a history of an apparent hiatal hernia and at times her symptoms improve with eructation.  I am recommending she undergo a 2D echo Doppler study to evaluate both systolic and diastolic function as well as valvular architecture.  A review of her laboratory demonstrates significant hyperlipidemia with an LDL cholesterol at 174.  Her total cholesterol is 249.  She has never been on treatment.  Long discussion with her regarding subclinical atherosclerosis.  I am recommending she undergo a coronary CT angios for further evaluation of her chest pain which will also for aspect of her aorta.  If atherosclerosis is noted, particularly with her elevated cholesterol I would recommend aggressive lipid intervention with target LDL less than 70.  She also has chronic venous insufficiency for which she underwent evaluation by Dr. Gwenlyn Saran.  Compression stockings were recommended.  I will see her in follow-up of the above studies and further recommendations will be made at that time.   Medication Adjustments/Labs and Tests Ordered: Current medicines are reviewed at length with the patient today.  Concerns regarding medicines are outlined above.  Medication changes, Labs and Tests ordered today are listed in the Patient Instructions below. Patient Instructions  Medication Instructions:  Your physician recommends that you continue on your current medications as directed. Please refer to the Current Medication list given  to you today.  Take metoprolol 50 mg 1 hour prior to CT scan-ONE TIME DOSE  Labwork: BMET-1 week prior to CT  Testing/Procedures: Your physician has requested that you have an echocardiogram. Echocardiography is a painless test that uses sound waves to create images of your heart. It provides your doctor with information about the size and shape of your heart and how well your heart's chambers and valves are working. This procedure takes approximately one hour. There are no restrictions for this procedure.  Your physician has requested that you have cardiac CT. Cardiac computed tomography (CT) is a painless test that uses an x-ray machine to take clear, detailed pictures of your heart. For further information please visit [email or web address]. Please follow instruction sheet as given.  Follow-Up: 2 months with Dr. Claiborne Billings  Any Other Special Instructions Will Be Listed Below (If Applicable).     If you need a refill on your cardiac medications before your next appointment, please call your pharmacy.      Signed, Shelva Majestic, MD  12/17/2017 11:59 AM    [facility] [address] [place], [place], [place]  [address] Phone: [phone]

## 2017-12-15 NOTE — Patient Instructions (Signed)
Medication Instructions:  Your physician recommends that you continue on your current medications as directed. Please refer to the Current Medication list given to you today.  Take metoprolol 50 mg 1 hour prior to CT scan-ONE TIME DOSE  Labwork: BMET-1 week prior to CT  Testing/Procedures: Your physician has requested that you have an echocardiogram. Echocardiography is a painless test that uses sound waves to create images of your heart. It provides your doctor with information about the size and shape of your heart and how well your heart's chambers and valves are working. This procedure takes approximately one hour. There are no restrictions for this procedure.  Your physician has requested that you have cardiac CT. Cardiac computed tomography (CT) is a painless test that uses an x-ray machine to take clear, detailed pictures of your heart. For further information please visit [email or web address]. Please follow instruction sheet as given.  Follow-Up: 2 months with Dr. Claiborne Billings  Any Other Special Instructions Will Be Listed Below (If Applicable).     If you need a refill on your cardiac medications before your next appointment, please call your pharmacy.

## 2017-12-17 ENCOUNTER — Encounter: Payer: Self-pay | Admitting: Cardiovascular Disease

## 2017-12-20 ENCOUNTER — Telehealth: Payer: Self-pay | Admitting: Cardiovascular Disease

## 2017-12-20 NOTE — Telephone Encounter (Signed)
Left message to call back  

## 2017-12-20 NOTE — Telephone Encounter (Signed)
New Message:    Pt wants to know if she needs lab work before her Echo appt?

## 2017-12-20 NOTE — Telephone Encounter (Signed)
Spoke with pt and informed that she does not have to have labs drawn before her ECHO. She is only to have labs drawn 1 week prior to coronary cta. Pt verbalized understanding.

## 2018-01-02 ENCOUNTER — Ambulatory Visit (HOSPITAL_COMMUNITY): Payer: PRIVATE HEALTH INSURANCE | Attending: Cardiology

## 2018-01-02 ENCOUNTER — Other Ambulatory Visit: Payer: Self-pay

## 2018-01-02 DIAGNOSIS — R0789 Other chest pain: Secondary | ICD-10-CM

## 2018-01-02 DIAGNOSIS — I351 Nonrheumatic aortic (valve) insufficiency: Secondary | ICD-10-CM | POA: Insufficient documentation

## 2018-01-02 DIAGNOSIS — E785 Hyperlipidemia, unspecified: Secondary | ICD-10-CM | POA: Insufficient documentation

## 2018-01-20 ENCOUNTER — Other Ambulatory Visit: Payer: Self-pay | Admitting: Family Medicine

## 2018-01-20 DIAGNOSIS — F418 Other specified anxiety disorders: Secondary | ICD-10-CM

## 2018-01-23 NOTE — Telephone Encounter (Signed)
LOV  09/27/17 Dr. Carlota Raspberry Last refill  11/04/17  # 30 0 refill

## 2018-01-24 ENCOUNTER — Telehealth: Payer: Self-pay

## 2018-01-24 ENCOUNTER — Other Ambulatory Visit: Payer: Self-pay

## 2018-01-24 ENCOUNTER — Encounter: Payer: Self-pay | Admitting: Family Medicine

## 2018-01-24 ENCOUNTER — Ambulatory Visit: Payer: PRIVATE HEALTH INSURANCE | Admitting: Family Medicine

## 2018-01-24 VITALS — BP 118/56 | HR 72 | Temp 98.3°F | Ht 62.0 in | Wt 147.8 lb

## 2018-01-24 DIAGNOSIS — E785 Hyperlipidemia, unspecified: Secondary | ICD-10-CM

## 2018-01-24 DIAGNOSIS — H4922 Sixth [abducent] nerve palsy, left eye: Secondary | ICD-10-CM

## 2018-01-24 DIAGNOSIS — R29818 Other symptoms and signs involving the nervous system: Secondary | ICD-10-CM

## 2018-01-24 DIAGNOSIS — R51 Headache: Secondary | ICD-10-CM | POA: Diagnosis not present

## 2018-01-24 DIAGNOSIS — R519 Headache, unspecified: Secondary | ICD-10-CM

## 2018-01-24 DIAGNOSIS — H81393 Other peripheral vertigo, bilateral: Secondary | ICD-10-CM | POA: Diagnosis not present

## 2018-01-24 DIAGNOSIS — F418 Other specified anxiety disorders: Secondary | ICD-10-CM | POA: Diagnosis not present

## 2018-01-24 DIAGNOSIS — H539 Unspecified visual disturbance: Secondary | ICD-10-CM | POA: Diagnosis not present

## 2018-01-24 MED ORDER — CLONAZEPAM 0.5 MG PO TABS: 0.5000 mg | ORAL_TABLET | Freq: Two times a day (BID) | ORAL | 1 refills | Status: DC | PRN

## 2018-01-24 MED ORDER — MECLIZINE HCL 25 MG PO TABS: 25.0000 mg | ORAL_TABLET | Freq: Three times a day (TID) | ORAL | 0 refills | Status: DC | PRN

## 2018-01-24 NOTE — Addendum Note (Signed)
Addended by: [name] R on: 01/24/2018 07:55 PM   Modules accepted: Orders, Level of Service

## 2018-01-24 NOTE — Telephone Encounter (Signed)
A friend of the pt. Was calling in for her - stating she was having vertigo. Connection was lost.

## 2018-01-24 NOTE — Progress Notes (Addendum)
Subjective:  By signing my name below, I, Moises Blood, attest that this documentation has been prepared under the direction and in the presence of [name], MD. Electronically Signed: Moises Blood, [place]. 01/24/2018 , 12:01 PM .  Patient was seen in Room 9 .   Patient ID: [name], female    DOB: 03-Nov-1950, 67 y.o.   MRN: [id number] Chief Complaint  Patient presents with  . Dizzy Spell    sat up in bed and the room was spinning for a little bit. 2-3 spells of this last night/early this morning  . Fall    acouple of falls this past year. (positive fall screening) better now   . Medication Refill    Kolonopin refill    HPI [name] is a 67 y.o. female Here for dizziness. She was last seen in February for unrelated concerns.   Depression She has a history of depression with anxiety last discussed in January. At that visit, she reports taking klonopin once per week for sleep. She was continued on Prozac; did plan on possibly stopping Prozac if symptoms are stable.   She takes klonopin about once or twice on the weekends, and rarely takes it during the week. She hasn't taken it recently. She's still taking her Prozac. Her depression and anxiety has been stable. She denies SI/HI. She has a rare drink after work. Her last prescription was 30 tablets on March 22nd.   Cardiac Appears she was seen on May 2nd by Dr. Claiborne Billings for chest tightness, did seem to be stress mediated. She denies any chest pain with exercise. She also had been seen prior for lower extremity edema, thought to have some chronic venous insufficiency, and recommended use of compression stockings. With her chest pain and occasional jaw discomfort, recommended echo and coronary CT to evaluated atherosclerosis. Echo from May 20th reviewed, grade 2 moderate diastolic dysfunction, aortic sclerosis with mild aortic insufficiency. Her CT is still pending, for June 27th. She has an appointment with Dr. Claiborne Billings on  July 22nd. Patient states she hasn't been wearing compression stockings. She tries to drink plenty of fluids a day.   Dizziness Patient states dizziness with some nausea started early this morning around 4:00 AM. She sat up at the edge of her bed, and felt the room was spinning. She sat and waited, and the dizziness had somewhat dissipated. She laid back down, but felt the room was still spinning when she turned to the sides in bed. She denies any blockage in her hearing. She heard a few pops and clicks in her ear earlier, but not now. Prior to today, she denies dizziness. She notes buying a new adjustable bed during Memorial weekend. She denies history of vertigo. She mentions previously worked on [employer] prior without motion sickness. She denies new palpitations, heart arrhythmia, weakness, slurred speech or facial droop. She denies any new diarrhea or vomiting. She feels fatigued now.   She notes having a headache yesterday around her eyes. When she looks at the computer for an extended period of time, her eyes feel fatigued. If she looks at her phone for extended period of time, her vision takes a moment to readjust. She's been going to [place] in [place]. She notes cleaning her ears last night with Q-tips. She's had tinnitus in the past year; did see hearing specialist, suggested acupuncture.   Positive fall screening She notes falls in past year from wet pavement and from tripping over vacuum cleaner at home.  Patient Active Problem List   Diagnosis Date Noted  . Cough 01/02/2013  . Anxiety state, unspecified 01/02/2013  . Breast cancer (DCIS), stage 0, Left, receptor +, dx 2012 09/08/2010   Past Medical History:  Diagnosis Date  . Allergy   . Anxiety   . Arthritis   . Asthma   . Bruises easily   . Cancer [facility])    double mastectomy  . Contact lens/glasses fitting   . Depression   . Heart murmur   . Hiatal hernia   . Kidney stone   . Osteoporosis   . Rectal bleeding     hemorroid  . Sinus drainage    Past Surgical History:  Procedure Laterality Date  . APPENDECTOMY    . BREAST LUMPECTOMY W/ NEEDLE LOCALIZATION  10/05/2010   lumpectomy  . BREAST SURGERY    . MASTECTOMY W/ SENTINEL NODE BIOPSY  01/26/2011  . OVARIAN CYST SURGERY  1997, 2001, 2008  . SIMPLE MASTECTOMY  01/26/2011   Allergies  Allergen Reactions  . Elavil [Amitriptyline Hcl] Rash  . Sulfur Rash   Prior to Admission medications   Medication Sig Start Date End Date Taking? Authorizing Provider  cetirizine (ZYRTEC) 10 MG tablet Take 10 mg by mouth daily as needed for allergies.    Yes [provider]  clonazePAM (KLONOPIN) 0.5 MG tablet Take 1 tablet (0.5 mg total) by mouth 2 (two) times daily as needed. 11/04/17  Yes [name], MD  FLUoxetine (PROZAC) 20 MG tablet Take 1 tablet (20 mg total) by mouth daily. 09/15/17  Yes [name], MD  fluticasone (FLONASE) 50 MCG/ACT nasal spray Place 2 sprays into both nostrils daily. 06/17/16  Yes [name], MD  metoprolol tartrate (LOPRESSOR) 50 MG tablet Take 50 mg (1 tablet) ONE hour prior to CT 12/15/17  Yes Troy Sine, MD  calcium-vitamin D (OSCAL WITH D) 500-200 MG-UNIT per tablet Take 1 tablet by mouth daily.    [provider]   Social History   Socioeconomic History  . Marital status: Married    Spouse name: Not on file  . Number of children: Not on file  . Years of education: Not on file  . Highest education level: Not on file  Occupational History  . Not on file  Social Needs  . Financial resource strain: Not on file  . Food insecurity:    Worry: Not on file    Inability: Not on file  . Transportation needs:    Medical: Not on file    Non-medical: Not on file  Tobacco Use  . Smoking status: Never Smoker  . Smokeless tobacco: Never Used  Substance and Sexual Activity  . Alcohol use: Yes    Alcohol/week: 0.0 oz    Comment: occasional  . Drug use: No  . Sexual activity: Never    Lifestyle  . Physical activity:    Days per week: Not on file    Minutes per session: Not on file  . Stress: Not on file  Relationships  . Social connections:    Talks on phone: Not on file    Gets together: Not on file    Attends religious service: Not on file    Active member of club or organization: Not on file    Attends meetings of clubs or organizations: Not on file    Relationship status: Not on file  . Intimate partner violence:    Fear of current or ex partner: Not on file  Emotionally abused: Not on file    Physically abused: Not on file    Forced sexual activity: Not on file  Other Topics Concern  . Not on file  Social History Narrative  . Not on file   Review of Systems  Constitutional: Negative for chills, fatigue, fever and unexpected weight change.  Respiratory: Negative for cough.   Gastrointestinal: Negative for constipation, diarrhea, nausea and vomiting.  Skin: Negative for rash and wound.  Neurological: Positive for dizziness. Negative for weakness and headaches.       Objective:   Physical Exam  Constitutional: She is oriented to person, place, and time. She appears well-developed and well-nourished.  HENT:  Head: Normocephalic and atraumatic.  Right Ear: Tympanic membrane and ear canal normal. No middle ear effusion.  Left Ear: Tympanic membrane and ear canal normal.  No middle ear effusion.  Eyes: Pupils are equal, round, and reactive to light. Conjunctivae and EOM are normal.  Few horizontal beats of nystagmus in both left and right with fast beats to both left and right gaze; possible fuzziness with possible double vision looking completely to the right on occasion, not reproducible with every exam  Neck: Carotid bruit is not present.  Cardiovascular: Normal rate, regular rhythm, normal heart sounds and intact distal pulses.  Pulmonary/Chest: Effort normal and breath sounds normal.  Abdominal: Soft. She exhibits no pulsatile midline mass. There  is no tenderness.  Neurological: She is alert and oriented to person, place, and time.  No pronator drift, equal upper and lower extremities strength, no air leakage with puffing of cheeks  Skin: Skin is warm and dry.  Psychiatric: She has a normal mood and affect. Her behavior is normal.  Vitals reviewed.   Vitals:   01/24/18 1124 01/24/18 1127  BP: (!) 128/50 (!) 118/56  Pulse: 72   Temp: 98.3 F (36.8 C)   TempSrc: Oral   SpO2: 94%   Weight: 147 lb 12.8 oz (67 kg)   Height: 5\' 2"  (1.575 m)    Orthostatic VS for the past 24 hrs (Last 3 readings):  BP- Lying Pulse- Lying BP- Sitting Pulse- Sitting BP- Standing at 0 minutes Pulse- Standing at 0 minutes BP- Standing at 3 minutes Pulse- Standing at 3 minutes  01/24/18 1222 119/69 60 135/74 66 128/77 76 126/75 72       Assessment & Plan:   [name] is a 67 y.o. female Peripheral vertigo of both ears - Plan: meclizine (ANTIVERT) 25 MG tablet  -Suspected peripheral vertigo with intermittent symptoms, reproducible with head movement.  Did report intermittent blurring with possible transient diplopia on exam, but that was also noted with glasses.  The symptoms have not been persistent, and no other focal neurologic findings on exam.   - We will have her evaluated with Optho today to see if there is any visual field deficit, and if so would consider MRI for further evaluation.  Would also consider MRI if headaches persist or any acute worsening symptoms.  ER precautions given.  Meclizine provided for symptomatic care for now, return to clinic if symptoms persist  Vision changes - Plan: Ambulatory referral to Ophthalmology, Orthostatic vital signs  - as above.   Depression with anxiety - Plan: clonazePAM (KLONOPIN) 0.5 MG tablet  - stable, no change in meds. Can discuss again in 6 months. Continue prozac for now   Meds ordered this encounter  Medications  . meclizine (ANTIVERT) 25 MG tablet    Sig: Take 1 tablet (25  mg total) by  mouth 3 (three) times daily as needed for dizziness.    Dispense:  30 tablet    Refill:  0  . clonazePAM (KLONOPIN) 0.5 MG tablet    Sig: Take 1 tablet (0.5 mg total) by mouth 2 (two) times daily as needed.    Dispense:  30 tablet    Refill:  1    This request is for a new prescription for a controlled substance as required by Federal/State law..   Patient Instructions   Your symptoms appear to be due to peripheral vertigo at this time.  Try meclizine every 8 hours as needed.  Make sure you are drinking plenty of fluids to minimize chance of dehydration contributing to those symptoms.  Please proceed to [facility] this afternoon for further vision testing and depending on that evaluation can decide if other imaging is needed.  If your symptoms are not improving in the next few days, or any worsening symptoms such as continued change in vision, be seen here sooner or emergency room if needed.  Vertigo Vertigo is the feeling that you or your surroundings are moving when they are not. Vertigo can be dangerous if it occurs while you are doing something that could endanger you or others, such as driving. What are the causes? This condition is caused by a disturbance in the signals that are sent by your body's sensory systems to your brain. Different causes of a disturbance can lead to vertigo, including:  Infections, especially in the inner ear.  A bad reaction to a drug, or misuse of alcohol and medicines.  Withdrawal from drugs or alcohol.  Quickly changing positions, as when lying down or rolling over in bed.  Migraine headaches.  Decreased blood flow to the brain.  Decreased blood pressure.  Increased pressure in the brain from a head or neck injury, stroke, infection, tumor, or bleeding.  Central nervous system disorders.  What are the signs or symptoms? Symptoms of this condition usually occur when you move your head or your eyes in different directions. Symptoms may start  suddenly, and they usually last for less than a minute. Symptoms may include:  Loss of balance and falling.  Feeling like you are spinning or moving.  Feeling like your surroundings are spinning or moving.  Nausea and vomiting.  Blurred vision or double vision.  Difficulty hearing.  Slurred speech.  Dizziness.  Involuntary eye movement (nystagmus).  Symptoms can be mild and cause only slight annoyance, or they can be severe and interfere with daily life. Episodes of vertigo may return (recur) over time, and they are often triggered by certain movements. Symptoms may improve over time. How is this diagnosed? This condition may be diagnosed based on medical history and the quality of your nystagmus. Your health care provider may test your eye movements by asking you to quickly change positions to trigger the nystagmus. This may be called the Dix-Hallpike test, head thrust test, or roll test. You may be referred to a health care provider who specializes in ear, nose, and throat (ENT) problems (otolaryngologist) or a provider who specializes in disorders of the central nervous system (neurologist). You may have additional testing, including:  A physical exam.  Blood tests.  MRI.  A CT scan.  An electrocardiogram (ECG). This records electrical activity in your heart.  An electroencephalogram (EEG). This records electrical activity in your brain.  Hearing tests.  How is this treated? Treatment for this condition depends on the cause  and the severity of the symptoms. Treatment options include:  Medicines to treat nausea or vertigo. These are usually used for severe cases. Some medicines that are used to treat other conditions may also reduce or eliminate vertigo symptoms. These include: ? Medicines that control allergies (antihistamines). ? Medicines that control seizures (anticonvulsants). ? Medicines that relieve depression (antidepressants). ? Medicines that relieve anxiety  (sedatives).  Head movements to adjust your inner ear back to normal. If your vertigo is caused by an ear problem, your health care provider may recommend certain movements to correct the problem.  Surgery. This is rare.  Follow these instructions at home: Safety  Move slowly.Avoid sudden body or head movements.  Avoid driving.  Avoid operating heavy machinery.  Avoid doing any tasks that would cause danger to you or others if you would have a vertigo episode during the task.  If you have trouble walking or keeping your balance, try using a cane for stability. If you feel dizzy or unstable, sit down right away.  Return to your normal activities as told by your health care provider. Ask your health care provider what activities are safe for you. General instructions  Take over-the-counter and prescription medicines only as told by your health care provider.  Avoid certain positions or movements as told by your health care provider.  Drink enough fluid to keep your urine clear or pale yellow.  Keep all follow-up visits as told by your health care provider. This is important. Contact a health care provider if:  Your medicines do not relieve your vertigo or they make it worse.  You have a fever.  Your condition gets worse or you develop new symptoms.  Your family or friends notice any behavioral changes.  Your nausea or vomiting gets worse.  You have numbness or a "pins and needles" sensation in part of your body. Get help right away if:  You have difficulty moving or speaking.  You are always dizzy.  You faint.  You develop severe headaches.  You have weakness in your hands, arms, or legs.  You have changes in your hearing or vision.  You develop a stiff neck.  You develop sensitivity to light. This information is not intended to replace advice given to you by your health care provider. Make sure you discuss any questions you have with your health care  provider. Document Released: 05/12/2005 Document Revised: 01/14/2016 Document Reviewed: 11/25/2014 Elsevier Interactive Patient Education  2018 [employer].     IF you received an x-ray today, you will receive an invoice from [facility]. Please contact [facility] at [phone] with questions or concerns regarding your invoice.   IF you received labwork today, you will receive an invoice from [facility]. Please contact [facility] at [phone] with questions or concerns regarding your invoice.   Our billing staff will not be able to assist you with questions regarding bills from these companies.  You will be contacted with the lab results as soon as they are available. The fastest way to get your results is to activate your My Chart account. Instructions are located on the last page of this paperwork. If you have not heard from [place] regarding the results in 2 weeks, please contact this office.       I personally performed the services described in this documentation, which was scribed in my presence. The recorded information has been reviewed and considered for accuracy and completeness, addended by me as needed, and agree with information above.  Signed,  [name], MD [facility].  01/24/18 12:39 PM

## 2018-01-24 NOTE — Patient Instructions (Addendum)
Your symptoms appear to be due to peripheral vertigo at this time.  Try meclizine every 8 hours as needed.  Make sure you are drinking plenty of fluids to minimize chance of dehydration contributing to those symptoms.  Please proceed to [facility] this afternoon for further vision testing and depending on that evaluation can decide if other imaging is needed.  If your symptoms are not improving in the next few days, or any worsening symptoms such as continued change in vision, be seen here sooner or emergency room if needed. Thanks for coming in today.   Update:  I will check inflammation tests, and order MRI of the brain to look into the cause of the eye nerve weakness. I will likely refer you to ENT for the vertigo and neuro for these symptoms, but will do some initial testing. We will call you about the timing of the MRI.  Return to the clinic or go to the nearest emergency room if any of your symptoms worsen or new symptoms occur.   Vertigo Vertigo is the feeling that you or your surroundings are moving when they are not. Vertigo can be dangerous if it occurs while you are doing something that could endanger you or others, such as driving. What are the causes? This condition is caused by a disturbance in the signals that are sent by your body's sensory systems to your brain. Different causes of a disturbance can lead to vertigo, including:  Infections, especially in the inner ear.  A bad reaction to a drug, or misuse of alcohol and medicines.  Withdrawal from drugs or alcohol.  Quickly changing positions, as when lying down or rolling over in bed.  Migraine headaches.  Decreased blood flow to the brain.  Decreased blood pressure.  Increased pressure in the brain from a head or neck injury, stroke, infection, tumor, or bleeding.  Central nervous system disorders.  What are the signs or symptoms? Symptoms of this condition usually occur when you move your head or your eyes in  different directions. Symptoms may start suddenly, and they usually last for less than a minute. Symptoms may include:  Loss of balance and falling.  Feeling like you are spinning or moving.  Feeling like your surroundings are spinning or moving.  Nausea and vomiting.  Blurred vision or double vision.  Difficulty hearing.  Slurred speech.  Dizziness.  Involuntary eye movement (nystagmus).  Symptoms can be mild and cause only slight annoyance, or they can be severe and interfere with daily life. Episodes of vertigo may return (recur) over time, and they are often triggered by certain movements. Symptoms may improve over time. How is this diagnosed? This condition may be diagnosed based on medical history and the quality of your nystagmus. Your health care provider may test your eye movements by asking you to quickly change positions to trigger the nystagmus. This may be called the Dix-Hallpike test, head thrust test, or roll test. You may be referred to a health care provider who specializes in ear, nose, and throat (ENT) problems (otolaryngologist) or a provider who specializes in disorders of the central nervous system (neurologist). You may have additional testing, including:  A physical exam.  Blood tests.  MRI.  A CT scan.  An electrocardiogram (ECG). This records electrical activity in your heart.  An electroencephalogram (EEG). This records electrical activity in your brain.  Hearing tests.  How is this treated? Treatment for this condition depends on the cause and the severity of the  symptoms. Treatment options include:  Medicines to treat nausea or vertigo. These are usually used for severe cases. Some medicines that are used to treat other conditions may also reduce or eliminate vertigo symptoms. These include: ? Medicines that control allergies (antihistamines). ? Medicines that control seizures (anticonvulsants). ? Medicines that relieve depression  (antidepressants). ? Medicines that relieve anxiety (sedatives).  Head movements to adjust your inner ear back to normal. If your vertigo is caused by an ear problem, your health care provider may recommend certain movements to correct the problem.  Surgery. This is rare.  Follow these instructions at home: Safety  Move slowly.Avoid sudden body or head movements.  Avoid driving.  Avoid operating heavy machinery.  Avoid doing any tasks that would cause danger to you or others if you would have a vertigo episode during the task.  If you have trouble walking or keeping your balance, try using a cane for stability. If you feel dizzy or unstable, sit down right away.  Return to your normal activities as told by your health care provider. Ask your health care provider what activities are safe for you. General instructions  Take over-the-counter and prescription medicines only as told by your health care provider.  Avoid certain positions or movements as told by your health care provider.  Drink enough fluid to keep your urine clear or pale yellow.  Keep all follow-up visits as told by your health care provider. This is important. Contact a health care provider if:  Your medicines do not relieve your vertigo or they make it worse.  You have a fever.  Your condition gets worse or you develop new symptoms.  Your family or friends notice any behavioral changes.  Your nausea or vomiting gets worse.  You have numbness or a "pins and needles" sensation in part of your body. Get help right away if:  You have difficulty moving or speaking.  You are always dizzy.  You faint.  You develop severe headaches.  You have weakness in your hands, arms, or legs.  You have changes in your hearing or vision.  You develop a stiff neck.  You develop sensitivity to light. This information is not intended to replace advice given to you by your health care provider. Make sure you discuss  any questions you have with your health care provider. Document Released: 05/12/2005 Document Revised: 01/14/2016 Document Reviewed: 11/25/2014 Elsevier Interactive Patient Education  2018 [employer].     IF you received an x-ray today, you will receive an invoice from [facility]. Please contact [facility] at [phone] with questions or concerns regarding your invoice.   IF you received labwork today, you will receive an invoice from [facility]. Please contact [facility] at [phone] with questions or concerns regarding your invoice.   Our billing staff will not be able to assist you with questions regarding bills from these companies.  You will be contacted with the lab results as soon as they are available. The fastest way to get your results is to activate your My Chart account. Instructions are located on the last page of this paperwork. If you have not heard from [place] regarding the results in 2 weeks, please contact this office.

## 2018-01-24 NOTE — Telephone Encounter (Signed)
Left message for pt. To return call about her vertigo symptoms.

## 2018-01-24 NOTE — Addendum Note (Signed)
Addended by: [name] R on: 01/24/2018 05:04 PM   Modules accepted: Orders

## 2018-01-24 NOTE — Progress Notes (Addendum)
Subjective:  By signing my name below, I, Moises Blood, attest that this documentation has been prepared under the direction and in the presence of Ellen Ray, MD. Electronically Signed: Moises Blood, [place]. 01/24/2018 , 4:57 PM .  Patient was seen in Room 1 .   Patient ID: [name], female    DOB: [birth year], 67 y.o.   MRN: [id number] Chief Complaint  Patient presents with  . Dizzy Spell    sat up in bed and the room was spinning for a little bit. 2-3 spells of this last night/early this morning  . Fall    acouple of falls this past year. (positive fall screening) better now   . Medication Refill    Kolonopin refill    HPI [name] is a 67 y.o. female Patient was seen earlier today, but with concerns of eye symptoms, she was seen by Dr. Katy Fitch at [facility]. She had weakness of left 6th nerve. Recommended neuro imaging MRI brain, and sed rate for temporal arteritis.   Patient notes headache primarily started this morning, but had slight frontal headache yesterday. She mentions partial stiff neck. She denies fever. She recalls vertigo initially started around 5:00 AM this morning. She will be going out of town from June 19th through June 25th. She also mentions previously having nosebleeds, wonder if these symptoms are correlated. She denies implanted metal, or pacemakers.   Patient Active Problem List   Diagnosis Date Noted  . Cough 01/02/2013  . Anxiety state, unspecified 01/02/2013  . Breast cancer (DCIS), stage 0, Left, receptor +, dx 2012 09/08/2010   Past Medical History:  Diagnosis Date  . Allergy   . Anxiety   . Arthritis   . Asthma   . Bruises easily   . Cancer [facility])    double mastectomy  . Contact lens/glasses fitting   . Depression   . Heart murmur   . Hiatal hernia   . Kidney stone   . Osteoporosis   . Rectal bleeding    hemorroid  . Sinus drainage    Past Surgical History:  Procedure Laterality Date  . APPENDECTOMY    . BREAST  LUMPECTOMY W/ NEEDLE LOCALIZATION  10/05/2010   lumpectomy  . BREAST SURGERY    . MASTECTOMY W/ SENTINEL NODE BIOPSY  01/26/2011  . OVARIAN CYST SURGERY  1997, 2001, 2008  . SIMPLE MASTECTOMY  01/26/2011   Allergies  Allergen Reactions  . Elavil [Amitriptyline Hcl] Rash  . Sulfur Rash   Prior to Admission medications   Medication Sig Start Date End Date Taking? Authorizing Provider  cetirizine (ZYRTEC) 10 MG tablet Take 10 mg by mouth daily as needed for allergies.    Yes [provider]  clonazePAM (KLONOPIN) 0.5 MG tablet Take 1 tablet (0.5 mg total) by mouth 2 (two) times daily as needed. 01/24/18  Yes [name], MD  FLUoxetine (PROZAC) 20 MG tablet Take 1 tablet (20 mg total) by mouth daily. 09/15/17  Yes [name], MD  fluticasone (FLONASE) 50 MCG/ACT nasal spray Place 2 sprays into both nostrils daily. 06/17/16  Yes [name], MD  metoprolol tartrate (LOPRESSOR) 50 MG tablet Take 50 mg (1 tablet) ONE hour prior to CT 12/15/17  Yes Troy Sine, MD  calcium-vitamin D (OSCAL WITH D) 500-200 MG-UNIT per tablet Take 1 tablet by mouth daily.    [provider]  meclizine (ANTIVERT) 25 MG tablet Take 1 tablet (25 mg total) by mouth 3 (  three) times daily as needed for dizziness. 01/24/18   [name], MD   Social History   Socioeconomic History  . Marital status: Married    Spouse name: Not on file  . Number of children: Not on file  . Years of education: Not on file  . Highest education level: Not on file  Occupational History  . Not on file  Social Needs  . Financial resource strain: Not on file  . Food insecurity:    Worry: Not on file    Inability: Not on file  . Transportation needs:    Medical: Not on file    Non-medical: Not on file  Tobacco Use  . Smoking status: Never Smoker  . Smokeless tobacco: Never Used  Substance and Sexual Activity  . Alcohol use: Yes    Alcohol/week: 0.0 oz    Comment: occasional  . Drug use:  No  . Sexual activity: Never  Lifestyle  . Physical activity:    Days per week: Not on file    Minutes per session: Not on file  . Stress: Not on file  Relationships  . Social connections:    Talks on phone: Not on file    Gets together: Not on file    Attends religious service: Not on file    Active member of club or organization: Not on file    Attends meetings of clubs or organizations: Not on file    Relationship status: Not on file  . Intimate partner violence:    Fear of current or ex partner: Not on file    Emotionally abused: Not on file    Physically abused: Not on file    Forced sexual activity: Not on file  Other Topics Concern  . Not on file  Social History Narrative  . Not on file   Review of Systems  Eyes: Positive for visual disturbance.  Neurological: Positive for headaches.       Objective:   Physical Exam  Constitutional: She is oriented to person, place, and time. She appears well-developed and well-nourished. No distress.  HENT:  Head: Normocephalic and atraumatic.  Eyes: Pupils are equal, round, and reactive to light. EOM are normal.  Neck: Neck supple.  Cardiovascular: Normal rate.  Pulmonary/Chest: Effort normal. No respiratory distress.  Musculoskeletal: Normal range of motion.  Neurological: She is alert and oriented to person, place, and time.  Skin: Skin is warm and dry.  Psychiatric: She has a normal mood and affect. Her behavior is normal.  Nursing note and vitals reviewed.   Vitals:   01/24/18 1124 01/24/18 1127  BP: (!) 128/50 (!) 118/56  Pulse: 72   Temp: 98.3 F (36.8 C)   TempSrc: Oral   SpO2: 94%   Weight: 147 lb 12.8 oz (67 kg)   Height: 5\' 2"  (1.575 m)    Orthostatic VS for the past 24 hrs (Last 3 readings):  BP- Lying Pulse- Lying BP- Sitting Pulse- Sitting BP- Standing at 0 minutes Pulse- Standing at 0 minutes BP- Standing at 3 minutes Pulse- Standing at 3 minutes  01/24/18 1222 119/69 60 135/74 66 128/77 76 126/75 72          Assessment & Plan:   [name] is a 67 y.o. female Peripheral vertigo of both ears - Plan: meclizine (ANTIVERT) 25 MG tablet  Vision changes - Plan: Ambulatory referral to Ophthalmology, Orthostatic vital signs, C-reactive protein, Sedimentation Rate  Depression with anxiety - Plan: clonazePAM (KLONOPIN) 0.5 MG  tablet  Acute nonintractable headache, unspecified headache type - Plan: C-reactive protein, Sedimentation Rate, CANCELED: MR Brain W Wo Contrast  6th nerve palsy, left - Plan: C-reactive protein, Sedimentation Rate  Other symptoms and signs involving the nervous system - Plan: CANCELED: MR Brain W Wo Contrast  Slight headache yesterday into today, onset of room spinning sensation approximately 5 AM, intermittent symptoms since that time.  No other focal weakness, slurred speech, not worst headache of her life, slight nausea with vertigo symptoms but no vomiting.  Nonfocal neurologic exam in office.  Initially suspected peripheral vertigo, but did have intermittent blurring or double vision with looking left.  She was evaluated acutely by ophthalmology after my initial visit and found to have a left 6th nerve weakness.    - returned for further testing and discussion after ophthalmology eval.  Will order sed rate and CRP, but less likely temporal arteritis.  -Discussed with neurologist on-call.  Plan for MRI with and without contrast, MRA brain.  Offered patient evaluation through ER tonight, but she would like to wait until tomorrow to have that testing done outpatient.  This was discussed with neurologist who feels this is reasonable given current symptoms.    Ultimately likely will have neurology eval depending on results tomorrow.  Plan for possible repeat A1c, echocardiogram, carotid imaging.  Given prior hyperlipidemia, likely will start high-dose statin as well as aspirin.  Plan discussed with patient, ER precautions given overnight.  Understanding expressed.  Meds  ordered this encounter  Medications  . meclizine (ANTIVERT) 25 MG tablet    Sig: Take 1 tablet (25 mg total) by mouth 3 (three) times daily as needed for dizziness.    Dispense:  30 tablet    Refill:  0  . clonazePAM (KLONOPIN) 0.5 MG tablet    Sig: Take 1 tablet (0.5 mg total) by mouth 2 (two) times daily as needed.    Dispense:  30 tablet    Refill:  1    This request is for a new prescription for a controlled substance as required by Federal/State law..   Patient Instructions   Your symptoms appear to be due to peripheral vertigo at this time.  Try meclizine every 8 hours as needed.  Make sure you are drinking plenty of fluids to minimize chance of dehydration contributing to those symptoms.  Please proceed to [facility] this afternoon for further vision testing and depending on that evaluation can decide if other imaging is needed.  If your symptoms are not improving in the next few days, or any worsening symptoms such as continued change in vision, be seen here sooner or emergency room if needed. Thanks for coming in today.   Update:  I will check inflammation tests, and order MRI of the brain to look into the cause of the eye nerve weakness. I will likely refer you to ENT for the vertigo and neuro for these symptoms, but will do some initial testing. We will call you about the timing of the MRI.  Return to the clinic or go to the nearest emergency room if any of your symptoms worsen or new symptoms occur.   Vertigo Vertigo is the feeling that you or your surroundings are moving when they are not. Vertigo can be dangerous if it occurs while you are doing something that could endanger you or others, such as driving. What are the causes? This condition is caused by a disturbance in the signals that are sent by your body's sensory  systems to your brain. Different causes of a disturbance can lead to vertigo, including:  Infections, especially in the inner ear.  A bad reaction to a  drug, or misuse of alcohol and medicines.  Withdrawal from drugs or alcohol.  Quickly changing positions, as when lying down or rolling over in bed.  Migraine headaches.  Decreased blood flow to the brain.  Decreased blood pressure.  Increased pressure in the brain from a head or neck injury, stroke, infection, tumor, or bleeding.  Central nervous system disorders.  What are the signs or symptoms? Symptoms of this condition usually occur when you move your head or your eyes in different directions. Symptoms may start suddenly, and they usually last for less than a minute. Symptoms may include:  Loss of balance and falling.  Feeling like you are spinning or moving.  Feeling like your surroundings are spinning or moving.  Nausea and vomiting.  Blurred vision or double vision.  Difficulty hearing.  Slurred speech.  Dizziness.  Involuntary eye movement (nystagmus).  Symptoms can be mild and cause only slight annoyance, or they can be severe and interfere with daily life. Episodes of vertigo may return (recur) over time, and they are often triggered by certain movements. Symptoms may improve over time. How is this diagnosed? This condition may be diagnosed based on medical history and the quality of your nystagmus. Your health care provider may test your eye movements by asking you to quickly change positions to trigger the nystagmus. This may be called the Dix-Hallpike test, head thrust test, or roll test. You may be referred to a health care provider who specializes in ear, nose, and throat (ENT) problems (otolaryngologist) or a provider who specializes in disorders of the central nervous system (neurologist). You may have additional testing, including:  A physical exam.  Blood tests.  MRI.  A CT scan.  An electrocardiogram (ECG). This records electrical activity in your heart.  An electroencephalogram (EEG). This records electrical activity in your brain.  Hearing  tests.  How is this treated? Treatment for this condition depends on the cause and the severity of the symptoms. Treatment options include:  Medicines to treat nausea or vertigo. These are usually used for severe cases. Some medicines that are used to treat other conditions may also reduce or eliminate vertigo symptoms. These include: ? Medicines that control allergies (antihistamines). ? Medicines that control seizures (anticonvulsants). ? Medicines that relieve depression (antidepressants). ? Medicines that relieve anxiety (sedatives).  Head movements to adjust your inner ear back to normal. If your vertigo is caused by an ear problem, your health care provider may recommend certain movements to correct the problem.  Surgery. This is rare.  Follow these instructions at home: Safety  Move slowly.Avoid sudden body or head movements.  Avoid driving.  Avoid operating heavy machinery.  Avoid doing any tasks that would cause danger to you or others if you would have a vertigo episode during the task.  If you have trouble walking or keeping your balance, try using a cane for stability. If you feel dizzy or unstable, sit down right away.  Return to your normal activities as told by your health care provider. Ask your health care provider what activities are safe for you. General instructions  Take over-the-counter and prescription medicines only as told by your health care provider.  Avoid certain positions or movements as told by your health care provider.  Drink enough fluid to keep your urine clear or pale yellow.  Keep  all follow-up visits as told by your health care provider. This is important. Contact a health care provider if:  Your medicines do not relieve your vertigo or they make it worse.  You have a fever.  Your condition gets worse or you develop new symptoms.  Your family or friends notice any behavioral changes.  Your nausea or vomiting gets worse.  You have  numbness or a "pins and needles" sensation in part of your body. Get help right away if:  You have difficulty moving or speaking.  You are always dizzy.  You faint.  You develop severe headaches.  You have weakness in your hands, arms, or legs.  You have changes in your hearing or vision.  You develop a stiff neck.  You develop sensitivity to light. This information is not intended to replace advice given to you by your health care provider. Make sure you discuss any questions you have with your health care provider. Document Released: 05/12/2005 Document Revised: 01/14/2016 Document Reviewed: 11/25/2014 Elsevier Interactive Patient Education  2018 [employer].     IF you received an x-Thompson today, you will receive an invoice from [facility]. Please contact [facility] at [phone] with questions or concerns regarding your invoice.   IF you received labwork today, you will receive an invoice from [place]. Please contact [facility] at [phone] with questions or concerns regarding your invoice.   Our billing staff will not be able to assist you with questions regarding bills from these companies.  You will be contacted with the lab results as soon as they are available. The fastest way to get your results is to activate your My Chart account. Instructions are located on the last page of this paperwork. If you have not heard from [place] regarding the results in 2 weeks, please contact this office.       I personally performed the services described in this documentation, which was scribed in my presence. The recorded information has been reviewed and considered for accuracy and completeness, addended by me as needed, and agree with information above.  Signed,   Ellen Ray, MD [facility] [place].  01/24/18 7:44 PM   10:40 AM Addendum 01/26/2018: MRI, MRA results reviewed with chronic small vessel changes noted but no  acute findings.  Sed rate and CRP were also reassuring, unlikely temporal arteritis.  Given previous significant hyperlipidemia we will start Lipitor 10 mg daily initially with repeat testing in 6 weeks, likely will need to titrate that up to at least 20 to 40 mg/day.  Potential side effects were discussed, results and plan were discussed with patient.  Will refer to neurologist for further evaluation of the nerve palsy, recheck 6 weeks, sooner if worse.

## 2018-01-25 ENCOUNTER — Ambulatory Visit (HOSPITAL_COMMUNITY)
Admission: RE | Admit: 2018-01-25 | Discharge: 2018-01-25 | Disposition: A | Discharge status: Home / Self Care | Payer: PRIVATE HEALTH INSURANCE | Source: Ambulatory Visit | Attending: Family Medicine | Admitting: Family Medicine

## 2018-01-25 ENCOUNTER — Telehealth: Payer: Self-pay | Admitting: Family Medicine

## 2018-01-25 DIAGNOSIS — R51 Headache: Secondary | ICD-10-CM | POA: Diagnosis present

## 2018-01-25 DIAGNOSIS — R29818 Other symptoms and signs involving the nervous system: Secondary | ICD-10-CM

## 2018-01-25 DIAGNOSIS — R519 Headache, unspecified: Secondary | ICD-10-CM

## 2018-01-25 LAB — SEDIMENTATION RATE: Sed Rate: 4 mm/hr (ref 0–40)

## 2018-01-25 LAB — C-REACTIVE PROTEIN: CRP: 2.4 mg/L (ref 0.0–4.9)

## 2018-01-25 MED ORDER — GADOBENATE DIMEGLUMINE 529 MG/ML IV SOLN
15.0000 mL | Freq: Once | INTRAVENOUS | Status: AC | PRN
  Administered 2018-01-25: 15 mL via INTRAVENOUS

## 2018-01-25 NOTE — Telephone Encounter (Signed)
Incoming call from Dr. Carlota Raspberry requesting patient be scheduled for STAT MRI. Patient scheduled for 7pm tonight. She needs STAT BMP, will order per verbal from provider. Patient notified of need for labs and imaging appointment time.

## 2018-01-25 NOTE — Addendum Note (Signed)
Addended by: [name] R on: 01/25/2018 11:27 AM   Modules accepted: Orders

## 2018-01-26 LAB — POCT I-STAT CREATININE: CREATININE: 0.8 mg/dL (ref 0.44–1.00)

## 2018-01-26 MED ORDER — ATORVASTATIN CALCIUM 10 MG PO TABS: 10.0000 mg | ORAL_TABLET | Freq: Every day | ORAL | 1 refills | Status: DC

## 2018-01-26 NOTE — Addendum Note (Signed)
Addended by: [name] R on: 01/26/2018 10:42 AM   Modules accepted: Orders

## 2018-02-08 ENCOUNTER — Telehealth: Payer: Self-pay | Admitting: Cardiovascular Disease

## 2018-02-08 DIAGNOSIS — Z01810 Encounter for preprocedural cardiovascular examination: Secondary | ICD-10-CM

## 2018-02-08 DIAGNOSIS — I872 Venous insufficiency (chronic) (peripheral): Secondary | ICD-10-CM

## 2018-02-08 NOTE — Telephone Encounter (Signed)
Spoke with the patient re: her concern of having contrast during her MRI/MRA in June and having a Cardiac CT in July. Advised pt to come prior to CT to have a bmet drawn. Pt agrees and will have drawn 02/21/18.

## 2018-02-08 NOTE — Telephone Encounter (Signed)
New Message   Patient is calling because she is due to have a CT on 07/15. She just had a MRI on 06/12. Her concern is have another study using dye and how it will affect her kidneys. Please call to discuss.

## 2018-02-09 ENCOUNTER — Ambulatory Visit (HOSPITAL_COMMUNITY): Payer: PRIVATE HEALTH INSURANCE

## 2018-02-21 ENCOUNTER — Encounter: Payer: Self-pay | Admitting: Family Medicine

## 2018-02-21 LAB — BASIC METABOLIC PANEL
BUN/Creatinine Ratio: 14 (ref 12–28)
BUN: 12 mg/dL (ref 8–27)
CO2: 24 mmol/L (ref 20–29)
CREATININE: 0.85 mg/dL (ref 0.57–1.00)
Calcium: 10.1 mg/dL (ref 8.7–10.3)
Chloride: 103 mmol/L (ref 96–106)
GFR calc Af Amer: 83 mL/min/{1.73_m2} (ref >59)
GFR, EST NON AFRICAN AMERICAN: 72 mL/min/{1.73_m2} (ref >59)
GLUCOSE: 90 mg/dL (ref 65–99)
POTASSIUM: 5 mmol/L (ref 3.5–5.2)
Sodium: 142 mmol/L (ref 134–144)

## 2018-02-22 ENCOUNTER — Encounter

## 2018-02-23 ENCOUNTER — Other Ambulatory Visit: Payer: Self-pay | Admitting: Family Medicine

## 2018-02-23 DIAGNOSIS — E785 Hyperlipidemia, unspecified: Secondary | ICD-10-CM

## 2018-02-23 NOTE — Progress Notes (Signed)
See email. Plan on fasting labs in next few weeks on lipitor to decide on dosage change.

## 2018-02-27 ENCOUNTER — Telehealth: Payer: Self-pay | Admitting: *Deleted

## 2018-02-27 ENCOUNTER — Ambulatory Visit (HOSPITAL_COMMUNITY)
Admission: RE | Admit: 2018-02-27 | Discharge: 2018-02-27 | Disposition: A | Discharge status: Home / Self Care | Payer: PRIVATE HEALTH INSURANCE | Source: Ambulatory Visit | Attending: Cardiovascular Disease | Admitting: Cardiovascular Disease

## 2018-02-27 ENCOUNTER — Encounter (HOSPITAL_COMMUNITY): Payer: Self-pay

## 2018-02-27 DIAGNOSIS — I2510 Atherosclerotic heart disease of native coronary artery without angina pectoris: Secondary | ICD-10-CM | POA: Diagnosis not present

## 2018-02-27 DIAGNOSIS — R911 Solitary pulmonary nodule: Secondary | ICD-10-CM | POA: Diagnosis not present

## 2018-02-27 DIAGNOSIS — R0789 Other chest pain: Secondary | ICD-10-CM

## 2018-02-27 DIAGNOSIS — R079 Chest pain, unspecified: Secondary | ICD-10-CM

## 2018-02-27 DIAGNOSIS — I700 Atherosclerosis of aorta: Secondary | ICD-10-CM | POA: Diagnosis not present

## 2018-02-27 MED ORDER — NITROGLYCERIN 0.4 MG SL SUBL: 0.4000 mg | SUBLINGUAL_TABLET | SUBLINGUAL | 3 refills | Status: DC | PRN

## 2018-02-27 MED ORDER — IOPAMIDOL (ISOVUE-370) INJECTION 76%
80.0000 mL | Freq: Once | INTRAVENOUS | Status: AC | PRN
  Administered 2018-02-27: 100 mL via INTRAVENOUS

## 2018-02-27 MED ORDER — METOPROLOL TARTRATE 5 MG/5ML IV SOLN
INTRAVENOUS | Status: AC
  Filled 2018-02-27: qty 10

## 2018-02-27 MED ORDER — ASPIRIN EC 81 MG PO TBEC: 81.0000 mg | DELAYED_RELEASE_TABLET | Freq: Every day | ORAL | 3 refills | Status: AC

## 2018-02-27 MED ORDER — NITROGLYCERIN 0.4 MG SL SUBL
0.8000 mg | SUBLINGUAL_TABLET | Freq: Once | SUBLINGUAL | Status: AC
  Administered 2018-02-27: 0.8 mg via SUBLINGUAL
  Filled 2018-02-27: qty 25

## 2018-02-27 MED ORDER — METOPROLOL TARTRATE 25 MG PO TABS: 12.5000 mg | ORAL_TABLET | Freq: Two times a day (BID) | ORAL | 3 refills | Status: DC

## 2018-02-27 MED ORDER — METOPROLOL TARTRATE 5 MG/5ML IV SOLN
5.0000 mg | INTRAVENOUS | Status: DC | PRN
  Filled 2018-02-27: qty 5

## 2018-02-27 MED ORDER — NITROGLYCERIN 0.4 MG SL SUBL
SUBLINGUAL_TABLET | SUBLINGUAL | Status: AC
  Filled 2018-02-27: qty 2

## 2018-02-27 MED ORDER — IOPAMIDOL (ISOVUE-370) INJECTION 76%
INTRAVENOUS | Status: AC
  Filled 2018-02-27: qty 100

## 2018-02-27 NOTE — Telephone Encounter (Signed)
Left message to call back  

## 2018-02-27 NOTE — Telephone Encounter (Signed)
Follow Up:; ° ° °Returning your call. °

## 2018-02-27 NOTE — Telephone Encounter (Signed)
Spoke to patient, aware of CT results and recommendations.   rx sent to pharmacy.       Left heart cath scheduled for Tuesday at 7:30, arrival time is 5:30 AM.     OV scheduled with Dr. Claiborne Billings Monday 7/22.  Labs to be completed at [place]

## 2018-02-27 NOTE — Telephone Encounter (Signed)
-----   Message from Troy Sine, MD sent at 02/27/2018  3:38 PM EDT ----- CT scan with low calcium score but 50 to 75% mid LAD stenosis and FFR positive for ischemia.  Please notify patient.  Please set up for appointment with me as early as possible.  Start aspirin 81 mg, metoprolol tartrate 12.5 mg twice daily and give sublingual nitroglycerin tablet.  We will set up for cardiac catheterization and discussed with patient at office visit.

## 2018-03-06 ENCOUNTER — Encounter: Payer: Self-pay | Admitting: Cardiovascular Disease

## 2018-03-06 ENCOUNTER — Ambulatory Visit: Payer: PRIVATE HEALTH INSURANCE | Admitting: Cardiovascular Disease

## 2018-03-06 VITALS — BP 116/62 | HR 52 | Ht 62.0 in | Wt 148.2 lb

## 2018-03-06 DIAGNOSIS — R931 Abnormal findings on diagnostic imaging of heart and coronary circulation: Secondary | ICD-10-CM | POA: Diagnosis not present

## 2018-03-06 DIAGNOSIS — Z01812 Encounter for preprocedural laboratory examination: Secondary | ICD-10-CM

## 2018-03-06 DIAGNOSIS — E785 Hyperlipidemia, unspecified: Secondary | ICD-10-CM | POA: Diagnosis not present

## 2018-03-06 DIAGNOSIS — R0789 Other chest pain: Secondary | ICD-10-CM

## 2018-03-06 LAB — CBC
Hematocrit: 40.8 % (ref 34.0–46.6)
Hemoglobin: 13.9 g/dL (ref 11.1–15.9)
MCH: 32.5 pg (ref 26.6–33.0)
MCHC: 34.1 g/dL (ref 31.5–35.7)
MCV: 95 fL (ref 79–97)
Platelets: 261 10*3/uL (ref 150–450)
RBC: 4.28 x10E6/uL (ref 3.77–5.28)
RDW: 13.4 % (ref 12.3–15.4)
WBC: 6.8 10*3/uL (ref 3.4–10.8)

## 2018-03-06 LAB — LIPID PANEL
CHOL/HDL RATIO: 3.6 ratio (ref 0.0–4.4)
Cholesterol, Total: 142 mg/dL (ref 100–199)
HDL: 39 mg/dL — AB (ref >39)
LDL Calculated: 72 mg/dL (ref 0–99)
Triglycerides: 153 mg/dL — ABNORMAL HIGH (ref 0–149)
VLDL CHOLESTEROL CAL: 31 mg/dL (ref 5–40)

## 2018-03-06 LAB — BASIC METABOLIC PANEL
BUN/Creatinine Ratio: 11 — ABNORMAL LOW (ref 12–28)
BUN: 10 mg/dL (ref 8–27)
CALCIUM: 9.7 mg/dL (ref 8.7–10.3)
CO2: 25 mmol/L (ref 20–29)
CREATININE: 0.87 mg/dL (ref 0.57–1.00)
Chloride: 102 mmol/L (ref 96–106)
GFR calc non Af Amer: 70 mL/min/{1.73_m2} (ref >59)
GFR, EST AFRICAN AMERICAN: 80 mL/min/{1.73_m2} (ref >59)
Glucose: 83 mg/dL (ref 65–99)
Potassium: 4.7 mmol/L (ref 3.5–5.2)
Sodium: 141 mmol/L (ref 134–144)

## 2018-03-06 NOTE — Patient Instructions (Signed)
Medication Instructions:  Your physician recommends that you continue on your current medications as directed. Please refer to the Current Medication list given to you today.  Labwork: Today (Lipid, CBC, BMET)  Testing/Procedures: Your physician has requested that you have a cardiac catheterization. Cardiac catheterization is used to diagnose and/or treat various heart conditions. Doctors may recommend this procedure for a number of different reasons. The most common reason is to evaluate chest pain. Chest pain can be a symptom of coronary artery disease (CAD), and cardiac catheterization can show whether plaque is narrowing or blocking your heart's arteries. This procedure is also used to evaluate the valves, as well as measure the blood flow and oxygen levels in different parts of your heart. For further information please visit [email or web address]. Please follow instruction sheet, as given.  Follow-Up: After cath with Dr. Claiborne Billings     If you need a refill on your cardiac medications before your next appointment, please call your pharmacy.

## 2018-03-06 NOTE — H&P (View-Only) (Signed)
Cardiology Office Note    Date:  03/06/2018   ID:  [name], DOB Dec 26, 1950, MRN [id number]  PCP:  [name], MD  Cardiologist:  Shelva Majestic, MD   Chief Complaint  Patient presents with  . Follow-up   F/U Cardiology evaluation; self referred  History of Present Illness:  [name] is a 67 y.o. female who present to the office today being self-referred for evaluation of an jaw discomfort.  She was given my recommendation by Candelaria Stagers.  [name] is a 67 year old divorced female who was told of having a possible heart murmur as a child and also had childhood asthma.  He has a history of breast CA and in 2012 underwent double mastectomy.  She subsequently underwent gel implants.  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  Her new job has created increased work-related stress.  Recently, she has noticed a light pain sensation in her jaw.  She has noticed some mild chest tightness extending to her back which seems to be more stress mediated.  It also seems to be related to some increased anxiety.  She denies any chest pain with exercise.  She admits to recent increased belching which seems to improve some of her jaw discomfort.  She was told of having a hiatal hernia.  She does not routinely exercise but she walks the hallways at work.  He has had some issues with bilateral lower extremity swelling which was felt to be multifactorial.  She saw Dr. Anice Paganini and underwent LE doppler imaging.  Is no evidence for DVT.  He did have abnormal reflux times in the common femoral vein.  Some chronic venous insufficiency in the deep venous system of the left.  There was no need for intervention but compression stockings were recommended.  Because of recurrent chest and jaw symptomatology, she presented for an initial evaluation with me on Dec 15, 2017.  I scheduled her to undergo a CT coronary angio.  The CT scan was done on  12/28/2017 and demonstrated a calcium score of 6.  There was mild calcium noted in the left main and mid LAD.  The left main had less than 20% calcific stenosis, the LAD had 50 to 75% mid LAD stenosis with mixed plaque.  There was a normal circumflex and RCA.  Alysis was positive in the LAD territory at 0.5.  Chest CT also showed nonspecific 2 mm left upper lobe nodule which was felt most likely to be benign.  Follow-up was needed if the patient is low risk but if patient was high risk noncontrast chest CT could be considered in 12 months.  When I received notification of her CT scan results, we contacted the patient, initiated aspirin 81 mg, metoprolol 12.5 mg twice a day and gave her a prescription for sublingual nitroglycerin.  His recent chest pain.  She does admit to jaw discomfort which typically is nonexertional.  Prior to her medications she had noticed some jaw discomfort with uphill walking.  She presents for evaluation.   Past Medical History:  Diagnosis Date  . Allergy   . Anxiety   . Arthritis   . Asthma   . Bruises easily   . Cancer [facility])    double mastectomy  . Contact lens/glasses fitting   . Depression   . Heart murmur   . Hiatal hernia   . Kidney stone   . Osteoporosis   . Rectal bleeding  hemorroid  . Sinus drainage     Past Surgical History:  Procedure Laterality Date  . APPENDECTOMY    . BREAST LUMPECTOMY W/ NEEDLE LOCALIZATION  10/05/2010   lumpectomy  . BREAST SURGERY    . MASTECTOMY W/ SENTINEL NODE BIOPSY  01/26/2011  . OVARIAN CYST SURGERY  1997, 2001, 2008  . SIMPLE MASTECTOMY  01/26/2011    Current Medications: Outpatient Medications Prior to Visit  Medication Sig Dispense Refill  . aspirin EC 81 MG tablet Take 1 tablet (81 mg total) by mouth daily. 90 tablet 3  . atorvastatin (LIPITOR) 10 MG tablet Take 1 tablet (10 mg total) by mouth daily. 30 tablet 1  . calcium-vitamin D (OSCAL WITH D) 500-200 MG-UNIT per tablet Take 1 tablet by mouth daily.      . cetirizine (ZYRTEC) 10 MG tablet Take 10 mg by mouth daily as needed for allergies.     . clonazePAM (KLONOPIN) 0.5 MG tablet Take 1 tablet (0.5 mg total) by mouth 2 (two) times daily as needed. 30 tablet 1  . FLUoxetine (PROZAC) 20 MG tablet Take 1 tablet (20 mg total) by mouth daily. 90 tablet 2  . fluticasone (FLONASE) 50 MCG/ACT nasal spray Place 2 sprays into both nostrils daily. 16 g 6  . meclizine (ANTIVERT) 25 MG tablet Take 1 tablet (25 mg total) by mouth 3 (three) times daily as needed for dizziness. 30 tablet 0  . metoprolol tartrate (LOPRESSOR) 25 MG tablet Take 0.5 tablets (12.5 mg total) by mouth 2 (two) times daily. 30 tablet 3  . nitroGLYCERIN (NITROSTAT) 0.4 MG SL tablet Place 1 tablet (0.4 mg total) under the tongue every 5 (five) minutes as needed for chest pain. 25 tablet 3   No facility-administered medications prior to visit.      Allergies:   Elavil [amitriptyline hcl] and Sulfur   Social History   Socioeconomic History  . Marital status: Single    Spouse name: Not on file  . Number of children: Not on file  . Years of education: Not on file  . Highest education level: Not on file  Occupational History  . Not on file  Social Needs  . Financial resource strain: Not on file  . Food insecurity:    Worry: Not on file    Inability: Not on file  . Transportation needs:    Medical: Not on file    Non-medical: Not on file  Tobacco Use  . Smoking status: Never Smoker  . Smokeless tobacco: Never Used  Substance and Sexual Activity  . Alcohol use: Yes    Alcohol/week: 0.0 oz    Comment: occasional  . Drug use: No  . Sexual activity: Never  Lifestyle  . Physical activity:    Days per week: Not on file    Minutes per session: Not on file  . Stress: Not on file  Relationships  . Social connections:    Talks on phone: Not on file    Gets together: Not on file    Attends religious service: Not on file    Active member of club or organization: Not on file     Attends meetings of clubs or organizations: Not on file    Relationship status: Not on file  Other Topics Concern  . Not on file  Social History Narrative  . Not on file    Social history is notable that she is divorced from her second marriage.  She was married initially at a young age  and then divorced.  She was single for 20 years.  Her second marriage was of 6 years duration.  She is divorced for 4 years.  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  Family History:  The patient's family history includes Cancer in her brother and sister; Diabetes in her sister; Heart Problems in her mother; Heart failure in her paternal grandfather and paternal grandmother; Hyperlipidemia in her mother; Hypertension in her father and mother.  Parents lived to an old age with her mother dying at 63 and her father dying at 75.  One brother died at age 81 with cancer and had diabetes.  One living brother and one living sister.  ROS General: Negative; No fevers, chills, or night sweats;  HEENT: Negative; No changes in vision or hearing, sinus congestion, difficulty swallowing Pulmonary: Negative; No cough, wheezing, shortness of breath, hemoptysis Cardiovascular: Negative; No chest pain, presyncope, syncope, palpitations GI: Positive for hiatal hernia GU: Negative; No dysuria, hematuria, or difficulty voiding Musculoskeletal: Negative; no myalgias, joint pain, or weakness Hematologic/Oncology:   estrogen receptor positive breast cancer Endocrine: Negative; no heat/cold intolerance; no diabetes Neuro: Negative; no changes in balance, headaches Skin: Negative; No rashes or skin lesions Psychiatric: Negative; No behavioral problems, depression Sleep: Negative; No snoring, daytime sleepiness, hypersomnolence, bruxism, restless legs, hypnogognic hallucinations,  no cataplexy Other comprehensive 14 point system review is negative.   PHYSICAL EXAM:   VS:  BP 116/62   Pulse (!) 52   Ht _0  (1.575 m)   Wt 148 lb 3.2 oz (67.2 kg)   LMP 09/10/2006   BMI 27.11 kg/m     Repeat blood pressure by me was 110/60  Wt Readings from Last 3 Encounters:  03/06/18 148 lb 3.2 oz (67.2 kg)  01/24/18 147 lb 12.8 oz (67 kg)  12/15/17 149 lb 12.8 oz (67.9 kg)    General: Alert, oriented, no distress.  Skin: normal turgor, no rashes, warm and dry HEENT: Normocephalic, atraumatic. Pupils equal round and reactive to light; sclera anicteric; extraocular muscles intact;  Nose without nasal septal hypertrophy Mouth/Parynx benign; Mallinpatti scale 3 Neck: No JVD, no carotid bruits; normal carotid upstroke Lungs: clear to ausculatation and percussion; no wheezing or rales Chest wall: without tenderness to palpitation Heart: PMI not displaced, RRR, s1 s2 normal, 1/6 systolic murmur, no diastolic murmur, no rubs, gallops, thrills, or heaves Abdomen: soft, nontender; no hepatosplenomehaly, BS+; abdominal aorta nontender and not dilated by palpation. Back: no CVA tenderness Pulses 2+; right radial pulse is diminutive.  No bruits Musculoskeletal: full range of motion, normal strength, no joint deformities Extremities: no clubbing cyanosis or edema, Homan's sign negative  Neurologic: grossly nonfocal; Cranial nerves grossly wnl Psychologic: Normal mood and affect   Studies/Labs Reviewed:   EKG:  EKG is ordered today.  ECG (independently read by me): Sinus bradycardia 52 bpm.  No ST segment changes  Dec 15, 2017 ECG (independently read by me): Sinus rhythm at 66 bpm.  No ectopy.  Normal intervals.  No ST segment changes.  Recent Labs: BMP Latest Ref Rng & Units 02/21/2018 01/25/2018 09/24/2017  Glucose 65 - 99 mg/dL 90 - 97  BUN 8 - 27 mg/dL 12 - 11  Creatinine 0.57 - 1.00 mg/dL 0.85 0.80 0.84  BUN/Creat Ratio 12 - 28 14 - 13  Sodium 134 -  144 mmol/L 142 - 145(H)   Potassium 3.5 - 5.2 mmol/L 5.0 - 5.3(H)  Chloride 96 - 106 mmol/L 103 - 104  CO2 20 - 29 mmol/L 24 - 24  Calcium 8.7 - 10.3 mg/dL 10.1 - 10.1     Hepatic Function Latest Ref Rng & Units 09/24/2017 12/27/2016 07/22/2016  Total Protein 6.0 - 8.5 g/dL 7.0 7.0 6.8  Albumin 3.6 - 4.8 g/dL 4.7 4.8 4.6  AST 0 - 40 IU/L _0 ALT 0 - 32 IU/L _1 Alk Phosphatase 39 - 117 IU/L 120(H) 112 115  Total Bilirubin 0.0 - 1.2 mg/dL 0.7 0.8 0.8    CBC Latest Ref Rng & Units 09/15/2017 05/21/2015 05/06/2014  WBC 4.6 - 10.2 K/uL 8.0 6.7 6.7  Hemoglobin 12.2 - 16.2 g/dL 13.4 13.8 13.2  Hematocrit 37.7 - 47.9 % 39.8 41.3 39.6  Platelets 150 - 400 K/uL - 259 305   Lab Results  Component Value Date   MCV 93.5 09/15/2017   MCV 92.8 05/21/2015   MCV 91.5 05/06/2014   Lab Results  Component Value Date   TSH 1.410 12/27/2016   Lab Results  Component Value Date   HGBA1C 5.7 (H) 09/24/2017     BNP No results found for: BNP  ProBNP No results found for: PROBNP   Lipid Panel     Component Value Date/Time   CHOL 249 (H) 09/24/2017 1045   TRIG 148 09/24/2017 1045   HDL 45 09/24/2017 1045   CHOLHDL 5.5 (H) 09/24/2017 1045   CHOLHDL 3.9 01/17/2016 0937   VLDL 37 (H) 01/17/2016 0937   LDLCALC 174 (H) 09/24/2017 1045     RADIOLOGY: Ct Coronary Morph W/cta Cor W/score W/ca W/cm &/or Wo/cm  Addendum Date: 02/27/2018   ADDENDUM REPORT: 02/27/2018 12:05 CLINICAL DATA:  Chest pain EXAM: Cardiac CTA MEDICATIONS: Sub lingual nitro. 13m and lopressor 543mTECHNIQUE: The patient was scanned on a Siemens [id number] scanner. Gantry rotation speed was 270 msecs. Collimation was .35m35mA 100 kV prospective scan was triggered in the descending thoracic aorta at 111 HU's with 5% padding centered around 78% of the R-R interval. Average HR during the scan was 52 bpm. The 3D data set was interpreted on a dedicated work station using MPR, MIP and VRT modes. A total of 80cc of contrast was used. FINDINGS:  Non-cardiac: [facility] No significant findings on limited lung and soft tissue windows. Calcium Score: Mild calcium noted in LM and mid LAD Coronary Arteries: Right dominant with no anomalies LM: Less than 20% calcific stenosis LAD: 50-75% mid LAD mixed plaque D1: Normal D2: Normal Circumflex: Normal OM1: Normal OM2: Normla RCA: Normal PDA: Normal PLA:  Normal IMPRESSION: 1.  Aortic root normal 3.3 cm 2.  Calcium score 6 which is 55 57 percentile for age and sex 3. 8050-75% mid LAD stenosis study will be sent for FFR CT PetJenkins Rougeectronically Signed   By: PetJenkins RougeD.   On: 02/27/2018 12:05   Result Date: 02/27/2018 EXAM: OVER-READ INTERPRETATION  CT CHEST The following report is an over-read performed by radiologist Dr. DanVinnie Langton [facility] [place] 02/27/2018. This over-read does not include interpretation of cardiac or coronary anatomy or pathology. The coronary calcium score/coronary CTA interpretation by the cardiologist is attached. COMPARISON:  None. FINDINGS: Aortic atherosclerosis. 2 mm nodule in the periphery of the left upper lobe (axial image 11 of series 11). Within the visualized portions  of the thorax there are no other larger suspicious appearing pulmonary nodules or masses, there is no acute consolidative airspace disease, no pleural effusions, no pneumothorax and no lymphadenopathy. Visualized portions of the upper abdomen are unremarkable. There are no aggressive appearing lytic or blastic lesions noted in the visualized portions of the skeleton. Bilateral breast implants are incidentally noted. IMPRESSION: 1. 2 mm left upper lobe nodule, nonspecific but statistically likely benign. No follow-up needed if patient is low-risk. Non-contrast chest CT can be considered in 12 months if patient is high-risk. This recommendation follows the consensus statement: Guidelines for Management of Incidental Pulmonary Nodules Detected on CT Images:  From the Fleischner Society 2017; Radiology 2017; 284:228-243. 2.  Aortic Atherosclerosis (ICD10-I70.0). Electronically Signed: By: Vinnie Langton M.D. On: 02/27/2018 11:37   Ct Coronary Fractional Flow Reserve Data Prep  Result Date: 02/27/2018 CLINICAL DATA:  CAD EXAM: FFR-CT TECHNIQUE: The best systolic and diastolic phases of the patients gated cardiac CTA were sent to [facility] for fluid hemodynamic analysis using both normal and sharp kernels CONTRAST:  None COMPARISON:  None FINDINGS: FFR CT was normal in RCA and circumflex FFR CT was markedly abnormal in the mid LAD at. 5 IMPRESSION: Hemodynamically significant mid LAD stenosis with FFR CT .50 Patient will be referred for heart cath with Dr Claiborne Billings Electronically Signed   By: Jenkins Rouge M.D.   On: 02/27/2018 15:26     Additional studies/ records that were reviewed today include:  Reviewed records from Dr. Cindee Lame.  I also reviewed her vascular surgery evaluation by Dr. Donzetta Matters.  ------------------------------------------------------------------- Jan 02, 2018  ECHO study Conclusions  - Left ventricle: The cavity size was normal. Wall thickness was   normal. Systolic function was normal. The estimated ejection   fraction was in the range of 55% to 60%. Wall motion was normal;   there were no regional wall motion abnormalities. Features are   consistent with a pseudonormal left ventricular filling pattern,   with concomitant abnormal relaxation and increased filling   pressure (grade 2 diastolic dysfunction). - Aortic valve: Valve mobility was restricted. There was mild   regurgitation.  Impressions:  - Normal LV systolic function; moderate diastolic dysfunction;   sclerotic aortic valve with mild AI.    CT Coronary Angio  IMPRESSION: 1.  Aortic root normal 3.3 cm  2.  Calcium score 6 which is 50 th percentile for age and sex  59.  50-75% mid LAD stenosis study will be sent for FFR  CT  FFR: IMPRESSION: Hemodynamically significant mid LAD stenosis with FFR CT .50  Patient will be referred for heart cath with Dr Claiborne Billings    ASSESSMENT:    No diagnosis found.  PLAN:  Ellen [name] is a very pleasant 67 year old female who  denies any prior history of significant cardiovascular disease.  She was told of having a faint heart murmur as a child.  On exam there is a faint 1/6 systolic murmur.  She has experienced episodes of jaw discomfort which seemed to improve with belching and also has noticed some vague chest fullness and at times has noted this in her back.  These have occurred non-exertionally but in retrospect admits that perhaps when she had walked uphill she noticed more jaw discomfort and belching.  When I initially saw her she was noticing an increase in work-related stress which seems to play a role in symptom development.  He has a history of a hiatal hernia.  I refer her for a  2D echo Doppler study which was done on Jan 02, 2018 which revealed normal systolic function with an EF of 55 to 60% and grade 2 diastolic dysfunction.  There was mild aortic sclerosis with mild aortic insufficiency.  I thoroughly reviewed her CT coronary angiogram with her today in the office which suggest moderate stenosis in the LAD and by FFR analysis seems to be hemodynamically significant.  I have recommended definitive cardiac catheterization and she is scheduled to undergo this procedure tomorrow.  I had empirically initiated low-dose metoprolol in addition to aspirin therapy and over the past week she has done well.  She has a history of significant hyperlipidemia and her primary physician had initiated very low-dose atorvastatin at 10 mg for an LDL of 174.  Remotely she may have had some myalgias on lovastatin.  She will undergo laboratory today dissipation of her catheterization tomorrow.  I will reassess her LDL.  I will increase statin therapy and potentially add Zetia following her  cardiac catheterization procedure.  I have reviewed the risks, indications, and alternatives to cardiac catheterization, possible angioplasty, and stenting with the patient. Risks include but are not limited to bleeding, infection, vascular injury, stroke, myocardial infection, arrhythmia, kidney injury, radiation-related injury in the case of prolonged fluoroscopy use, emergency cardiac surgery, and death. The patient understands the risks of serious complication is 1-2 in 7225 with diagnostic cardiac cath and 1-2% or less with angioplasty/stenting.     Medication Adjustments/Labs and Tests Ordered: Current medicines are reviewed at length with the patient today.  Concerns regarding medicines are outlined above.  Medication changes, Labs and Tests ordered today are listed in the Patient Instructions below. There are no Patient Instructions on file for this visit.   Signed, Shelva Majestic, MD  03/06/2018 8:44 AM    [place] [address], [place], [place],   [address] Phone: [phone]

## 2018-03-06 NOTE — Progress Notes (Addendum)
Cardiology Office Note    Date:  03/06/2018   ID:  [name], DOB Dec 26, 1950, MRN [id number]  PCP:  [name], MD  Cardiologist:  Shelva Majestic, MD   Chief Complaint  Patient presents with  . Follow-up   F/U Cardiology evaluation; self referred  History of Present Illness:  [name] is a 67 y.o. female who present to the office today being self-referred for evaluation of an jaw discomfort.  She was given my recommendation by Candelaria Stagers.  [name] is a 67 year old divorced female who was told of having a possible heart murmur as a child and also had childhood asthma.  He has a history of breast CA and in 2012 underwent double mastectomy.  She subsequently underwent gel implants.  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  Her new job has created increased work-related stress.  Recently, she has noticed a light pain sensation in her jaw.  She has noticed some mild chest tightness extending to her back which seems to be more stress mediated.  It also seems to be related to some increased anxiety.  She denies any chest pain with exercise.  She admits to recent increased belching which seems to improve some of her jaw discomfort.  She was told of having a hiatal hernia.  She does not routinely exercise but she walks the hallways at work.  He has had some issues with bilateral lower extremity swelling which was felt to be multifactorial.  She saw Dr. Anice Paganini and underwent LE doppler imaging.  Is no evidence for DVT.  He did have abnormal reflux times in the common femoral vein.  Some chronic venous insufficiency in the deep venous system of the left.  There was no need for intervention but compression stockings were recommended.  Because of recurrent chest and jaw symptomatology, she presented for an initial evaluation with me on Dec 15, 2017.  I scheduled her to undergo a CT coronary angio.  The CT scan was done on  12/28/2017 and demonstrated a calcium score of 6.  There was mild calcium noted in the left main and mid LAD.  The left main had less than 20% calcific stenosis, the LAD had 50 to 75% mid LAD stenosis with mixed plaque.  There was a normal circumflex and RCA.  Alysis was positive in the LAD territory at 0.5.  Chest CT also showed nonspecific 2 mm left upper lobe nodule which was felt most likely to be benign.  Follow-up was needed if the patient is low risk but if patient was high risk noncontrast chest CT could be considered in 12 months.  When I received notification of her CT scan results, we contacted the patient, initiated aspirin 81 mg, metoprolol 12.5 mg twice a day and gave her a prescription for sublingual nitroglycerin.  His recent chest pain.  She does admit to jaw discomfort which typically is nonexertional.  Prior to her medications she had noticed some jaw discomfort with uphill walking.  She presents for evaluation.   Past Medical History:  Diagnosis Date  . Allergy   . Anxiety   . Arthritis   . Asthma   . Bruises easily   . Cancer [facility])    double mastectomy  . Contact lens/glasses fitting   . Depression   . Heart murmur   . Hiatal hernia   . Kidney stone   . Osteoporosis   . Rectal bleeding  hemorroid  . Sinus drainage     Past Surgical History:  Procedure Laterality Date  . APPENDECTOMY    . BREAST LUMPECTOMY W/ NEEDLE LOCALIZATION  10/05/2010   lumpectomy  . BREAST SURGERY    . MASTECTOMY W/ SENTINEL NODE BIOPSY  01/26/2011  . OVARIAN CYST SURGERY  1997, 2001, 2008  . SIMPLE MASTECTOMY  01/26/2011    Current Medications: Outpatient Medications Prior to Visit  Medication Sig Dispense Refill  . aspirin EC 81 MG tablet Take 1 tablet (81 mg total) by mouth daily. 90 tablet 3  . atorvastatin (LIPITOR) 10 MG tablet Take 1 tablet (10 mg total) by mouth daily. 30 tablet 1  . calcium-vitamin D (OSCAL WITH D) 500-200 MG-UNIT per tablet Take 1 tablet by mouth daily.      . cetirizine (ZYRTEC) 10 MG tablet Take 10 mg by mouth daily as needed for allergies.     . clonazePAM (KLONOPIN) 0.5 MG tablet Take 1 tablet (0.5 mg total) by mouth 2 (two) times daily as needed. 30 tablet 1  . FLUoxetine (PROZAC) 20 MG tablet Take 1 tablet (20 mg total) by mouth daily. 90 tablet 2  . fluticasone (FLONASE) 50 MCG/ACT nasal spray Place 2 sprays into both nostrils daily. 16 g 6  . meclizine (ANTIVERT) 25 MG tablet Take 1 tablet (25 mg total) by mouth 3 (three) times daily as needed for dizziness. 30 tablet 0  . metoprolol tartrate (LOPRESSOR) 25 MG tablet Take 0.5 tablets (12.5 mg total) by mouth 2 (two) times daily. 30 tablet 3  . nitroGLYCERIN (NITROSTAT) 0.4 MG SL tablet Place 1 tablet (0.4 mg total) under the tongue every 5 (five) minutes as needed for chest pain. 25 tablet 3   No facility-administered medications prior to visit.      Allergies:   Elavil [amitriptyline hcl] and Sulfur   Social History   Socioeconomic History  . Marital status: Single    Spouse name: Not on file  . Number of children: Not on file  . Years of education: Not on file  . Highest education level: Not on file  Occupational History  . Not on file  Social Needs  . Financial resource strain: Not on file  . Food insecurity:    Worry: Not on file    Inability: Not on file  . Transportation needs:    Medical: Not on file    Non-medical: Not on file  Tobacco Use  . Smoking status: Never Smoker  . Smokeless tobacco: Never Used  Substance and Sexual Activity  . Alcohol use: Yes    Alcohol/week: 0.0 oz    Comment: occasional  . Drug use: No  . Sexual activity: Never  Lifestyle  . Physical activity:    Days per week: Not on file    Minutes per session: Not on file  . Stress: Not on file  Relationships  . Social connections:    Talks on phone: Not on file    Gets together: Not on file    Attends religious service: Not on file    Active member of club or organization: Not on file     Attends meetings of clubs or organizations: Not on file    Relationship status: Not on file  Other Topics Concern  . Not on file  Social History Narrative  . Not on file    Social history is notable that she is divorced from her second marriage.  She was married initially at a young age  and then divorced.  She was single for 20 years.  Her second marriage was of 6 years duration.  She is divorced for 4 years.  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  Family History:  The patient's family history includes Cancer in her brother and sister; Diabetes in her sister; Heart Problems in her mother; Heart failure in her paternal grandfather and paternal grandmother; Hyperlipidemia in her mother; Hypertension in her father and mother.  Parents lived to an old age with her mother dying at 63 and her father dying at 75.  One brother died at age 81 with cancer and had diabetes.  One living brother and one living sister.  ROS General: Negative; No fevers, chills, or night sweats;  HEENT: Negative; No changes in vision or hearing, sinus congestion, difficulty swallowing Pulmonary: Negative; No cough, wheezing, shortness of breath, hemoptysis Cardiovascular: Negative; No chest pain, presyncope, syncope, palpitations GI: Positive for hiatal hernia GU: Negative; No dysuria, hematuria, or difficulty voiding Musculoskeletal: Negative; no myalgias, joint pain, or weakness Hematologic/Oncology:   estrogen receptor positive breast cancer Endocrine: Negative; no heat/cold intolerance; no diabetes Neuro: Negative; no changes in balance, headaches Skin: Negative; No rashes or skin lesions Psychiatric: Negative; No behavioral problems, depression Sleep: Negative; No snoring, daytime sleepiness, hypersomnolence, bruxism, restless legs, hypnogognic hallucinations,  no cataplexy Other comprehensive 14 point system review is negative.   PHYSICAL EXAM:   VS:  BP 116/62   Pulse (!) 52   Ht _0  (1.575 m)   Wt 148 lb 3.2 oz (67.2 kg)   LMP 09/10/2006   BMI 27.11 kg/m     Repeat blood pressure by me was 110/60  Wt Readings from Last 3 Encounters:  03/06/18 148 lb 3.2 oz (67.2 kg)  01/24/18 147 lb 12.8 oz (67 kg)  12/15/17 149 lb 12.8 oz (67.9 kg)    General: Alert, oriented, no distress.  Skin: normal turgor, no rashes, warm and dry HEENT: Normocephalic, atraumatic. Pupils equal round and reactive to light; sclera anicteric; extraocular muscles intact;  Nose without nasal septal hypertrophy Mouth/Parynx benign; Mallinpatti scale 3 Neck: No JVD, no carotid bruits; normal carotid upstroke Lungs: clear to ausculatation and percussion; no wheezing or rales Chest wall: without tenderness to palpitation Heart: PMI not displaced, RRR, s1 s2 normal, 1/6 systolic murmur, no diastolic murmur, no rubs, gallops, thrills, or heaves Abdomen: soft, nontender; no hepatosplenomehaly, BS+; abdominal aorta nontender and not dilated by palpation. Back: no CVA tenderness Pulses 2+; right radial pulse is diminutive.  No bruits Musculoskeletal: full range of motion, normal strength, no joint deformities Extremities: no clubbing cyanosis or edema, Homan's sign negative  Neurologic: grossly nonfocal; Cranial nerves grossly wnl Psychologic: Normal mood and affect   Studies/Labs Reviewed:   EKG:  EKG is ordered today.  ECG (independently read by me): Sinus bradycardia 52 bpm.  No ST segment changes  Dec 15, 2017 ECG (independently read by me): Sinus rhythm at 66 bpm.  No ectopy.  Normal intervals.  No ST segment changes.  Recent Labs: BMP Latest Ref Rng & Units 02/21/2018 01/25/2018 09/24/2017  Glucose 65 - 99 mg/dL 90 - 97  BUN 8 - 27 mg/dL 12 - 11  Creatinine 0.57 - 1.00 mg/dL 0.85 0.80 0.84  BUN/Creat Ratio 12 - 28 14 - 13  Sodium 134 -  144 mmol/L 142 - 145(H)   Potassium 3.5 - 5.2 mmol/L 5.0 - 5.3(H)  Chloride 96 - 106 mmol/L 103 - 104  CO2 20 - 29 mmol/L 24 - 24  Calcium 8.7 - 10.3 mg/dL 10.1 - 10.1     Hepatic Function Latest Ref Rng & Units 09/24/2017 12/27/2016 07/22/2016  Total Protein 6.0 - 8.5 g/dL 7.0 7.0 6.8  Albumin 3.6 - 4.8 g/dL 4.7 4.8 4.6  AST 0 - 40 IU/L _0 ALT 0 - 32 IU/L _1 Alk Phosphatase 39 - 117 IU/L 120(H) 112 115  Total Bilirubin 0.0 - 1.2 mg/dL 0.7 0.8 0.8    CBC Latest Ref Rng & Units 09/15/2017 05/21/2015 05/06/2014  WBC 4.6 - 10.2 K/uL 8.0 6.7 6.7  Hemoglobin 12.2 - 16.2 g/dL 13.4 13.8 13.2  Hematocrit 37.7 - 47.9 % 39.8 41.3 39.6  Platelets 150 - 400 K/uL - 259 305   Lab Results  Component Value Date   MCV 93.5 09/15/2017   MCV 92.8 05/21/2015   MCV 91.5 05/06/2014   Lab Results  Component Value Date   TSH 1.410 12/27/2016   Lab Results  Component Value Date   HGBA1C 5.7 (H) 09/24/2017     BNP No results found for: BNP  ProBNP No results found for: PROBNP   Lipid Panel     Component Value Date/Time   CHOL 249 (H) 09/24/2017 1045   TRIG 148 09/24/2017 1045   HDL 45 09/24/2017 1045   CHOLHDL 5.5 (H) 09/24/2017 1045   CHOLHDL 3.9 01/17/2016 0937   VLDL 37 (H) 01/17/2016 0937   LDLCALC 174 (H) 09/24/2017 1045     RADIOLOGY: Ct Coronary Morph W/cta Cor W/score W/ca W/cm &/or Wo/cm  Addendum Date: 02/27/2018   ADDENDUM REPORT: 02/27/2018 12:05 CLINICAL DATA:  Chest pain EXAM: Cardiac CTA MEDICATIONS: Sub lingual nitro. 13m and lopressor 543mTECHNIQUE: The patient was scanned on a Siemens [id number] scanner. Gantry rotation speed was 270 msecs. Collimation was .35m35mA 100 kV prospective scan was triggered in the descending thoracic aorta at 111 HU's with 5% padding centered around 78% of the R-R interval. Average HR during the scan was 52 bpm. The 3D data set was interpreted on a dedicated work station using MPR, MIP and VRT modes. A total of 80cc of contrast was used. FINDINGS:  Non-cardiac: [facility] No significant findings on limited lung and soft tissue windows. Calcium Score: Mild calcium noted in LM and mid LAD Coronary Arteries: Right dominant with no anomalies LM: Less than 20% calcific stenosis LAD: 50-75% mid LAD mixed plaque D1: Normal D2: Normal Circumflex: Normal OM1: Normal OM2: Normla RCA: Normal PDA: Normal PLA:  Normal IMPRESSION: 1.  Aortic root normal 3.3 cm 2.  Calcium score 6 which is 55 57 percentile for age and sex 3. 8050-75% mid LAD stenosis study will be sent for FFR CT PetJenkins Rougeectronically Signed   By: PetJenkins RougeD.   On: 02/27/2018 12:05   Result Date: 02/27/2018 EXAM: OVER-READ INTERPRETATION  CT CHEST The following report is an over-read performed by radiologist Dr. DanVinnie Langton [facility] [place] 02/27/2018. This over-read does not include interpretation of cardiac or coronary anatomy or pathology. The coronary calcium score/coronary CTA interpretation by the cardiologist is attached. COMPARISON:  None. FINDINGS: Aortic atherosclerosis. 2 mm nodule in the periphery of the left upper lobe (axial image 11 of series 11). Within the visualized portions  of the thorax there are no other larger suspicious appearing pulmonary nodules or masses, there is no acute consolidative airspace disease, no pleural effusions, no pneumothorax and no lymphadenopathy. Visualized portions of the upper abdomen are unremarkable. There are no aggressive appearing lytic or blastic lesions noted in the visualized portions of the skeleton. Bilateral breast implants are incidentally noted. IMPRESSION: 1. 2 mm left upper lobe nodule, nonspecific but statistically likely benign. No follow-up needed if patient is low-risk. Non-contrast chest CT can be considered in 12 months if patient is high-risk. This recommendation follows the consensus statement: Guidelines for Management of Incidental Pulmonary Nodules Detected on CT Images:  From the Fleischner Society 2017; Radiology 2017; 284:228-243. 2.  Aortic Atherosclerosis (ICD10-I70.0). Electronically Signed: By: Vinnie Langton M.D. On: 02/27/2018 11:37   Ct Coronary Fractional Flow Reserve Data Prep  Result Date: 02/27/2018 CLINICAL DATA:  CAD EXAM: FFR-CT TECHNIQUE: The best systolic and diastolic phases of the patients gated cardiac CTA were sent to [facility] for fluid hemodynamic analysis using both normal and sharp kernels CONTRAST:  None COMPARISON:  None FINDINGS: FFR CT was normal in RCA and circumflex FFR CT was markedly abnormal in the mid LAD at. 5 IMPRESSION: Hemodynamically significant mid LAD stenosis with FFR CT .50 Patient will be referred for heart cath with Dr Claiborne Billings Electronically Signed   By: Jenkins Rouge M.D.   On: 02/27/2018 15:26     Additional studies/ records that were reviewed today include:  Reviewed records from Dr. Cindee Lame.  I also reviewed her vascular surgery evaluation by Dr. Donzetta Matters.  ------------------------------------------------------------------- Jan 02, 2018  ECHO study Conclusions  - Left ventricle: The cavity size was normal. Wall thickness was   normal. Systolic function was normal. The estimated ejection   fraction was in the range of 55% to 60%. Wall motion was normal;   there were no regional wall motion abnormalities. Features are   consistent with a pseudonormal left ventricular filling pattern,   with concomitant abnormal relaxation and increased filling   pressure (grade 2 diastolic dysfunction). - Aortic valve: Valve mobility was restricted. There was mild   regurgitation.  Impressions:  - Normal LV systolic function; moderate diastolic dysfunction;   sclerotic aortic valve with mild AI.    CT Coronary Angio  IMPRESSION: 1.  Aortic root normal 3.3 cm  2.  Calcium score 6 which is 50 th percentile for age and sex  59.  50-75% mid LAD stenosis study will be sent for FFR  CT  FFR: IMPRESSION: Hemodynamically significant mid LAD stenosis with FFR CT .50  Patient will be referred for heart cath with Dr Claiborne Billings    ASSESSMENT:    No diagnosis found.  PLAN:  [name] is a very pleasant 67 year old female who  denies any prior history of significant cardiovascular disease.  She was told of having a faint heart murmur as a child.  On exam there is a faint 1/6 systolic murmur.  She has experienced episodes of jaw discomfort which seemed to improve with belching and also has noticed some vague chest fullness and at times has noted this in her back.  These have occurred non-exertionally but in retrospect admits that perhaps when she had walked uphill she noticed more jaw discomfort and belching.  When I initially saw her she was noticing an increase in work-related stress which seems to play a role in symptom development.  He has a history of a hiatal hernia.  I refer her for a  2D echo Doppler study which was done on Jan 02, 2018 which revealed normal systolic function with an EF of 55 to 60% and grade 2 diastolic dysfunction.  There was mild aortic sclerosis with mild aortic insufficiency.  I thoroughly reviewed her CT coronary angiogram with her today in the office which suggest moderate stenosis in the LAD and by FFR analysis seems to be hemodynamically significant.  I have recommended definitive cardiac catheterization and she is scheduled to undergo this procedure tomorrow.  I had empirically initiated low-dose metoprolol in addition to aspirin therapy and over the past week she has done well.  She has a history of significant hyperlipidemia and her primary physician had initiated very low-dose atorvastatin at 10 mg for an LDL of 174.  Remotely she may have had some myalgias on lovastatin.  She will undergo laboratory today dissipation of her catheterization tomorrow.  I will reassess her LDL.  I will increase statin therapy and potentially add Zetia following her  cardiac catheterization procedure.  I have reviewed the risks, indications, and alternatives to cardiac catheterization, possible angioplasty, and stenting with the patient. Risks include but are not limited to bleeding, infection, vascular injury, stroke, myocardial infection, arrhythmia, kidney injury, radiation-related injury in the case of prolonged fluoroscopy use, emergency cardiac surgery, and death. The patient understands the risks of serious complication is 1-2 in 7225 with diagnostic cardiac cath and 1-2% or less with angioplasty/stenting.     Medication Adjustments/Labs and Tests Ordered: Current medicines are reviewed at length with the patient today.  Concerns regarding medicines are outlined above.  Medication changes, Labs and Tests ordered today are listed in the Patient Instructions below. There are no Patient Instructions on file for this visit.   Signed, Shelva Majestic, MD  03/06/2018 8:44 AM    [place] [address], [place], [place],   [address] Phone: [phone]

## 2018-03-07 ENCOUNTER — Ambulatory Visit (HOSPITAL_COMMUNITY)
Admission: RE | Admit: 2018-03-07 | Discharge: 2018-03-08 | Disposition: A | Discharge status: Home / Self Care | Payer: PRIVATE HEALTH INSURANCE | Source: Ambulatory Visit | Attending: Cardiovascular Disease | Admitting: Cardiovascular Disease

## 2018-03-07 ENCOUNTER — Other Ambulatory Visit: Payer: Self-pay

## 2018-03-07 ENCOUNTER — Encounter (HOSPITAL_COMMUNITY): Payer: Self-pay | Admitting: Cardiovascular Disease

## 2018-03-07 ENCOUNTER — Ambulatory Visit (HOSPITAL_COMMUNITY)
Admission: RE | Disposition: A | Discharge status: Home / Self Care | Payer: Self-pay | Source: Ambulatory Visit | Attending: Cardiovascular Disease

## 2018-03-07 DIAGNOSIS — Z888 Allergy status to other drugs, medicaments and biological substances status: Secondary | ICD-10-CM | POA: Insufficient documentation

## 2018-03-07 DIAGNOSIS — Z7982 Long term (current) use of aspirin: Secondary | ICD-10-CM | POA: Diagnosis not present

## 2018-03-07 DIAGNOSIS — E785 Hyperlipidemia, unspecified: Secondary | ICD-10-CM | POA: Diagnosis not present

## 2018-03-07 DIAGNOSIS — M1990 Unspecified osteoarthritis, unspecified site: Secondary | ICD-10-CM | POA: Diagnosis not present

## 2018-03-07 DIAGNOSIS — Z809 Family history of malignant neoplasm, unspecified: Secondary | ICD-10-CM | POA: Insufficient documentation

## 2018-03-07 DIAGNOSIS — Z79899 Other long term (current) drug therapy: Secondary | ICD-10-CM | POA: Insufficient documentation

## 2018-03-07 DIAGNOSIS — J45909 Unspecified asthma, uncomplicated: Secondary | ICD-10-CM | POA: Insufficient documentation

## 2018-03-07 DIAGNOSIS — Z87442 Personal history of urinary calculi: Secondary | ICD-10-CM | POA: Diagnosis not present

## 2018-03-07 DIAGNOSIS — Z9013 Acquired absence of bilateral breasts and nipples: Secondary | ICD-10-CM | POA: Insufficient documentation

## 2018-03-07 DIAGNOSIS — I25110 Atherosclerotic heart disease of native coronary artery with unstable angina pectoris: Secondary | ICD-10-CM | POA: Diagnosis not present

## 2018-03-07 DIAGNOSIS — Z853 Personal history of malignant neoplasm of breast: Secondary | ICD-10-CM | POA: Insufficient documentation

## 2018-03-07 DIAGNOSIS — Z8249 Family history of ischemic heart disease and other diseases of the circulatory system: Secondary | ICD-10-CM | POA: Diagnosis not present

## 2018-03-07 DIAGNOSIS — Z7951 Long term (current) use of inhaled steroids: Secondary | ICD-10-CM | POA: Insufficient documentation

## 2018-03-07 DIAGNOSIS — Z9889 Other specified postprocedural states: Secondary | ICD-10-CM | POA: Diagnosis not present

## 2018-03-07 DIAGNOSIS — Z955 Presence of coronary angioplasty implant and graft: Secondary | ICD-10-CM

## 2018-03-07 DIAGNOSIS — I200 Unstable angina: Secondary | ICD-10-CM

## 2018-03-07 HISTORY — DX: Unspecified asthma, uncomplicated: J45.909

## 2018-03-07 HISTORY — DX: Pure hypercholesterolemia, unspecified: E78.00

## 2018-03-07 HISTORY — DX: Personal history of urinary calculi: Z87.442

## 2018-03-07 HISTORY — DX: Gastro-esophageal reflux disease without esophagitis: K21.9

## 2018-03-07 HISTORY — DX: Malignant neoplasm of unspecified site of left female breast: C50.912

## 2018-03-07 HISTORY — PX: CORONARY STENT INTERVENTION: CATH118234

## 2018-03-07 HISTORY — DX: Tinnitus, bilateral: H93.13

## 2018-03-07 HISTORY — DX: Atherosclerotic heart disease of native coronary artery without angina pectoris: I25.10

## 2018-03-07 HISTORY — PX: LEFT HEART CATH AND CORONARY ANGIOGRAPHY: CATH118249

## 2018-03-07 LAB — POCT ACTIVATED CLOTTING TIME
ACTIVATED CLOTTING TIME: 290 s
Activated Clotting Time: 318 seconds

## 2018-03-07 SURGERY — LEFT HEART CATH AND CORONARY ANGIOGRAPHY
Anesthesia: LOCAL

## 2018-03-07 MED ORDER — HEPARIN SODIUM (PORCINE) 1000 UNIT/ML IJ SOLN
INTRAMUSCULAR | Status: DC | PRN
  Administered 2018-03-07: 1000 [IU] via INTRAVENOUS
  Administered 2018-03-07: 6000 [IU] via INTRAVENOUS
  Administered 2018-03-07: 2000 [IU] via INTRAVENOUS
  Administered 2018-03-07: 3500 [IU] via INTRAVENOUS

## 2018-03-07 MED ORDER — MIDAZOLAM HCL 2 MG/2ML IJ SOLN
INTRAMUSCULAR | Status: AC
  Filled 2018-03-07: qty 2

## 2018-03-07 MED ORDER — VERAPAMIL HCL 2.5 MG/ML IV SOLN
INTRAVENOUS | Status: DC | PRN
  Administered 2018-03-07 (×2): 10 mL via INTRA_ARTERIAL

## 2018-03-07 MED ORDER — SODIUM CHLORIDE 0.9 % IV SOLN: 250.0000 mL | INTRAVENOUS | Status: DC | PRN

## 2018-03-07 MED ORDER — NITROGLYCERIN 1 MG/10 ML FOR IR/CATH LAB
INTRA_ARTERIAL | Status: DC | PRN
  Administered 2018-03-07 (×2): 200 ug via INTRACORONARY

## 2018-03-07 MED ORDER — NITROGLYCERIN IN D5W 200-5 MCG/ML-% IV SOLN
INTRAVENOUS | Status: AC
  Filled 2018-03-07: qty 250

## 2018-03-07 MED ORDER — FENTANYL CITRATE (PF) 100 MCG/2ML IJ SOLN
INTRAMUSCULAR | Status: AC
  Filled 2018-03-07: qty 2

## 2018-03-07 MED ORDER — ASPIRIN 81 MG PO CHEW
81.0000 mg | CHEWABLE_TABLET | Freq: Every day | ORAL | Status: DC
  Administered 2018-03-08: 81 mg via ORAL
  Filled 2018-03-07: qty 1

## 2018-03-07 MED ORDER — SODIUM CHLORIDE 0.9 % WEIGHT BASED INFUSION: 1.0000 mL/kg/h | INTRAVENOUS | Status: DC

## 2018-03-07 MED ORDER — NITROGLYCERIN IN D5W 200-5 MCG/ML-% IV SOLN
INTRAVENOUS | Status: DC | PRN
  Administered 2018-03-07: 5 ug/min via INTRAVENOUS

## 2018-03-07 MED ORDER — NITROGLYCERIN 1 MG/10 ML FOR IR/CATH LAB
INTRA_ARTERIAL | Status: AC
  Filled 2018-03-07: qty 10

## 2018-03-07 MED ORDER — TICAGRELOR 90 MG PO TABS
90.0000 mg | ORAL_TABLET | Freq: Two times a day (BID) | ORAL | Status: DC
  Administered 2018-03-07 – 2018-03-08 (×2): 90 mg via ORAL
  Filled 2018-03-07 (×2): qty 1

## 2018-03-07 MED ORDER — SODIUM CHLORIDE 0.9% FLUSH: 3.0000 mL | INTRAVENOUS | Status: DC | PRN

## 2018-03-07 MED ORDER — LIDOCAINE HCL (PF) 1 % IJ SOLN
INTRAMUSCULAR | Status: AC
  Filled 2018-03-07: qty 30

## 2018-03-07 MED ORDER — IOHEXOL 350 MG/ML SOLN
INTRAVENOUS | Status: DC | PRN
  Administered 2018-03-07: 100 mL via INTRA_ARTERIAL

## 2018-03-07 MED ORDER — FENTANYL CITRATE (PF) 100 MCG/2ML IJ SOLN
INTRAMUSCULAR | Status: DC | PRN
  Administered 2018-03-07 (×4): 25 ug via INTRAVENOUS

## 2018-03-07 MED ORDER — ACETAMINOPHEN 325 MG PO TABS
650.0000 mg | ORAL_TABLET | ORAL | Status: DC | PRN
  Administered 2018-03-07: 650 mg via ORAL
  Filled 2018-03-07: qty 2

## 2018-03-07 MED ORDER — HEPARIN (PORCINE) IN NACL 1000-0.9 UT/500ML-% IV SOLN
INTRAVENOUS | Status: AC
  Filled 2018-03-07: qty 500

## 2018-03-07 MED ORDER — CLONAZEPAM 0.5 MG PO TABS: 0.5000 mg | ORAL_TABLET | Freq: Two times a day (BID) | ORAL | Status: DC | PRN

## 2018-03-07 MED ORDER — LABETALOL HCL 5 MG/ML IV SOLN: 10.0000 mg | INTRAVENOUS | Status: AC | PRN

## 2018-03-07 MED ORDER — HEPARIN SODIUM (PORCINE) 1000 UNIT/ML IJ SOLN
INTRAMUSCULAR | Status: AC
  Filled 2018-03-07: qty 1

## 2018-03-07 MED ORDER — LIDOCAINE HCL (PF) 1 % IJ SOLN
INTRAMUSCULAR | Status: DC | PRN
  Administered 2018-03-07: 5 mL via SUBCUTANEOUS

## 2018-03-07 MED ORDER — VERAPAMIL HCL 2.5 MG/ML IV SOLN
INTRAVENOUS | Status: AC
  Filled 2018-03-07: qty 2

## 2018-03-07 MED ORDER — TICAGRELOR 90 MG PO TABS
ORAL_TABLET | ORAL | Status: AC
  Filled 2018-03-07: qty 2

## 2018-03-07 MED ORDER — ONDANSETRON HCL 4 MG/2ML IJ SOLN: 4.0000 mg | Freq: Four times a day (QID) | INTRAMUSCULAR | Status: DC | PRN

## 2018-03-07 MED ORDER — MIDAZOLAM HCL 2 MG/2ML IJ SOLN
INTRAMUSCULAR | Status: DC | PRN
  Administered 2018-03-07: 2 mg via INTRAVENOUS
  Administered 2018-03-07 (×2): 1 mg via INTRAVENOUS

## 2018-03-07 MED ORDER — ANGIOPLASTY BOOK
Freq: Once | Status: AC
  Administered 2018-03-07: 1
  Filled 2018-03-07: qty 1

## 2018-03-07 MED ORDER — SODIUM CHLORIDE 0.9% FLUSH: 3.0000 mL | Freq: Two times a day (BID) | INTRAVENOUS | Status: DC

## 2018-03-07 MED ORDER — FLUOXETINE HCL 20 MG PO CAPS
20.0000 mg | ORAL_CAPSULE | Freq: Every day | ORAL | Status: DC
  Administered 2018-03-08: 20 mg via ORAL
  Filled 2018-03-07: qty 1

## 2018-03-07 MED ORDER — ASPIRIN 81 MG PO CHEW: 81.0000 mg | CHEWABLE_TABLET | ORAL | Status: DC

## 2018-03-07 MED ORDER — SODIUM CHLORIDE 0.9 % WEIGHT BASED INFUSION
3.0000 mL/kg/h | INTRAVENOUS | Status: DC
  Administered 2018-03-07: 3 mL/kg/h via INTRAVENOUS

## 2018-03-07 MED ORDER — HYDRALAZINE HCL 20 MG/ML IJ SOLN: 5.0000 mg | INTRAMUSCULAR | Status: AC | PRN

## 2018-03-07 MED ORDER — TICAGRELOR 90 MG PO TABS
ORAL_TABLET | ORAL | Status: DC | PRN
  Administered 2018-03-07: 180 mg via ORAL

## 2018-03-07 MED ORDER — ATORVASTATIN CALCIUM 80 MG PO TABS
80.0000 mg | ORAL_TABLET | Freq: Every day | ORAL | Status: DC
  Filled 2018-03-07: qty 1

## 2018-03-07 MED ORDER — DIAZEPAM 5 MG PO TABS
5.0000 mg | ORAL_TABLET | Freq: Four times a day (QID) | ORAL | Status: DC | PRN
Start: 2018-03-07 — End: 2018-03-07

## 2018-03-07 MED ORDER — SODIUM CHLORIDE 0.9 % IV SOLN
INTRAVENOUS | Status: DC
  Administered 2018-03-07: 10:00:00 via INTRAVENOUS

## 2018-03-07 SURGICAL SUPPLY — 22 items
BALLN SAPPHIRE 2.0X10 (BALLOONS) ×2
BALLN SAPPHIRE ~~LOC~~ 2.5X12 (BALLOONS) ×1 IMPLANT
BALLN WOLVERINE 2.00X10 (BALLOONS) ×2
BALLN WOLVERINE 2.50X10 (BALLOONS) ×2
BALLOON SAPPHIRE 2.0X10 (BALLOONS) IMPLANT
BALLOON WOLVERINE 2.00X10 (BALLOONS) IMPLANT
BALLOON WOLVERINE 2.50X10 (BALLOONS) IMPLANT
CATH OPTITORQUE TIG 4.0 5F (CATHETERS) ×1 IMPLANT
CATH VISTA GUIDE 6FR XBLAD3.5 (CATHETERS) ×1 IMPLANT
DEVICE RAD COMP TR BAND LRG (VASCULAR PRODUCTS) ×1 IMPLANT
GLIDESHEATH SLEND SS 6F .021 (SHEATH) ×1 IMPLANT
GUIDEWIRE INQWIRE 1.5J.035X260 (WIRE) IMPLANT
INQWIRE 1.5J .035X260CM (WIRE) ×2
KIT ENCORE 26 ADVANTAGE (KITS) ×1 IMPLANT
KIT HEART LEFT (KITS) ×2 IMPLANT
PACK CARDIAC CATHETERIZATION (CUSTOM PROCEDURE TRAY) ×2 IMPLANT
SHEATH PROBE COVER 6X72 (BAG) ×1 IMPLANT
STENT RESOLUTE ONYX 2.5X18 (Permanent Stent) ×1 IMPLANT
TRANSDUCER W/STOPCOCK (MISCELLANEOUS) ×2 IMPLANT
TUBING CIL FLEX 10 FLL-RA (TUBING) ×2 IMPLANT
WIRE COUGAR XT STRL 190CM (WIRE) ×1 IMPLANT
WIRE PT2 MS 185 (WIRE) ×1 IMPLANT

## 2018-03-07 NOTE — Interval H&P Note (Signed)
Cath Lab Visit (complete for each Cath Lab visit)  Clinical Evaluation Leading to the Procedure:   ACS: No.  Non-ACS:    Anginal Classification: CCS III  Anti-ischemic medical therapy: Minimal Therapy (1 class of medications)  Non-Invasive Test Results: Intermediate-risk stress test findings: cardiac mortality 1-3%/year  Prior CABG: No previous CABG      History and Physical Interval Note:  03/07/2018 7:27 AM  [name]  has presented today for surgery, with the diagnosis of abnormal ct  The various methods of treatment have been discussed with the patient and family. After consideration of risks, benefits and other options for treatment, the patient has consented to  Procedure(s): LEFT HEART CATH AND CORONARY ANGIOGRAPHY (N/A) as a surgical intervention .  The patient's history has been reviewed, patient examined, no change in status, stable for surgery.  I have reviewed the patient's chart and labs.  Questions were answered to the patient's satisfaction.     Shelva Majestic

## 2018-03-08 ENCOUNTER — Telehealth: Payer: Self-pay | Admitting: Physician Assistant

## 2018-03-08 ENCOUNTER — Telehealth: Payer: Self-pay | Admitting: Cardiology

## 2018-03-08 DIAGNOSIS — E7800 Pure hypercholesterolemia, unspecified: Secondary | ICD-10-CM | POA: Diagnosis not present

## 2018-03-08 DIAGNOSIS — E785 Hyperlipidemia, unspecified: Secondary | ICD-10-CM

## 2018-03-08 DIAGNOSIS — I25110 Atherosclerotic heart disease of native coronary artery with unstable angina pectoris: Secondary | ICD-10-CM | POA: Diagnosis not present

## 2018-03-08 LAB — BASIC METABOLIC PANEL
ANION GAP: 7 (ref 5–15)
BUN: 12 mg/dL (ref 8–23)
CALCIUM: 8.5 mg/dL — AB (ref 8.9–10.3)
CO2: 22 mmol/L (ref 22–32)
CREATININE: 0.77 mg/dL (ref 0.44–1.00)
Chloride: 113 mmol/L — ABNORMAL HIGH (ref 98–111)
Glucose, Bld: 106 mg/dL — ABNORMAL HIGH (ref 70–99)
Potassium: 4.2 mmol/L (ref 3.5–5.1)
SODIUM: 142 mmol/L (ref 135–145)

## 2018-03-08 LAB — CBC
HCT: 36.5 % (ref 36.0–46.0)
Hemoglobin: 11.9 g/dL — ABNORMAL LOW (ref 12.0–15.0)
MCH: 31.6 pg (ref 26.0–34.0)
MCHC: 32.6 g/dL (ref 30.0–36.0)
MCV: 96.8 fL (ref 78.0–100.0)
Platelets: 215 10*3/uL (ref 150–400)
RBC: 3.77 MIL/uL — AB (ref 3.87–5.11)
RDW: 12.6 % (ref 11.5–15.5)
WBC: 7 10*3/uL (ref 4.0–10.5)

## 2018-03-08 MED ORDER — TICAGRELOR 90 MG PO TABS: 90.0000 mg | ORAL_TABLET | Freq: Two times a day (BID) | ORAL | 0 refills | Status: DC

## 2018-03-08 MED ORDER — ATORVASTATIN CALCIUM 80 MG PO TABS: 80.0000 mg | ORAL_TABLET | Freq: Every day | ORAL | 3 refills | Status: DC

## 2018-03-08 MED ORDER — METOPROLOL TARTRATE 25 MG PO TABS: 12.5000 mg | ORAL_TABLET | Freq: Every day | ORAL | 3 refills | Status: DC

## 2018-03-08 MED ORDER — TICAGRELOR 90 MG PO TABS: 90.0000 mg | ORAL_TABLET | Freq: Two times a day (BID) | ORAL | 3 refills | Status: DC

## 2018-03-08 MED FILL — Heparin Sod (Porcine)-NaCl IV Soln 1000 Unit/500ML-0.9%: INTRAVENOUS | Qty: 500 | Status: AC

## 2018-03-08 MED FILL — Verapamil HCl IV Soln 2.5 MG/ML: INTRAVENOUS | Qty: 2 | Status: AC

## 2018-03-08 NOTE — Discharge Summary (Addendum)
Discharge Summary    Patient ID: [name],  MRN: [id number], DOB/AGE: 67-24-1952 67 y.o.  Admit date: 03/07/2018 Discharge date: 03/08/2018  Primary Care Provider: Wendie Thompson Primary Cardiologist: Shelva Majestic, MD  Discharge Diagnoses    Active Problems:   Unstable angina [facility])   Coronary artery disease involving native coronary artery of native heart with unstable angina pectoris ([place])   Hyperlipidemia   Allergies Allergies  Allergen Reactions  . Elavil [Amitriptyline Hcl] Rash  . Sulfur Rash    Diagnostic Studies/Procedures    Left heart catheterization 03/07/18:  Prox LAD lesion is 95% stenosed.  Post intervention, there is a 0% residual stenosis.  A stent was successfully placed.   Single-vessel coronary obstructive disease with a 95% fibrotic appearing mid LAD stenosis distal to a proximal septal and diagonal vessel.  Normal left circumflex and dominant RCA.  LVEDP 21 mm.  Successful PCI to the mid LAD utilizing Cutting Balloon and DES stenting with a 2.5 x 18 mm Resolute Onyx stent with the 95% stenosis being reduced to 0% evidence for brisk TIMI-3 flow with no evidence for dissection at the completion of the procedure.  RECOMMENDATION:  Recommend uninterrupted dual antiplatelet therapy with Aspirin 81mg  daily and Ticagrelor 90mg  twice daily for a minimum of 12 months (ACS - Class I recommendation).  The patient will continue with low-dose beta-blocker therapy.  Aggressive lipid-lowering therapy with high potency statin.    _____________   History of Present Illness    Ms [name] is a 67 year old divorced female who was told of having a possible heart murmur as a child and also had childhood asthma. She has a history of breast CA and in 2012 underwent double mastectomy.  She subsequently underwent gel implants.  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  Her new job has created  increased work-related stress.  Recently, she has noticed a light pain sensation in her jaw.  She has noticed some mild chest tightness extending to her back which seems to be more stress mediated.  It also seems to be related to some increased anxiety.  She denies any chest pain with exercise.  She admits to recent increased belching which seems to improve some of her jaw discomfort.  She was told of having a hiatal hernia.  She does not routinely exercise but she walks the hallways at work.  He has had some issues with bilateral lower extremity swelling which was felt to be multifactorial.  She saw Dr. Anice Paganini and underwent LE doppler imaging.  Is no evidence for DVT.  He did have abnormal reflux times in the common femoral vein.  Some chronic venous insufficiency in the deep venous system of the left.  There was no need for intervention but compression stockings were recommended.  Because of recurrent chest and jaw symptomatology, she presented for an initial evaluation by Cardiology on Dec 15, 2017.  She underwent a coronary CT scan  on 12/28/2017 and demonstrated a calcium score of 6.  There was mild calcium noted in the left main and mid LAD.  The left main had less than 20% calcific stenosis, the LAD had 50 to 75% mid LAD stenosis with mixed plaque.  There was a normal circumflex and RCA.  Alysis was positive in the LAD territory at 0.5.  Chest CT also showed nonspecific 2 mm left upper lobe nodule which was felt most likely to be benign.  Follow-up  was needed if the patient is low risk but if patient was high risk noncontrast chest CT could be considered in 12 months.  Based on her results, she was started on aspirin 81 mg, metoprolol 12.5 mg twice a day and gave her a prescription for sublingual nitroglycerin. She does admit to jaw discomfort which typically is nonexertional.  Prior to her medications she had noticed some jaw discomfort with uphill walking.  She presented for [facility].    Hospital  Course     Consultants: None   1. CAD: patient presented for outpatient LHC after continued symptoms and recent Coronary CTA which was concerning for LAD stenosis. She underwent LHC 7/23 which revealed 95% stenosis of proximal LAD, managed with PCI/DES. She was recommended for DAPT with aspirin and brilinta for a minimum of 1 year. She was transitioned to high-intensity statin and recommended to continue her home BBlocker.  - Continue ASA, brilinta, statin, and BBlocker  2. HLD: Lipid panel this admission with LDL 72; near goal of <70. Her statin was increased to high-intensity dosing - Continue atrovastatin 80mg  daily _____________  Discharge Vitals Blood pressure (!) 158/58, pulse 61, temperature 97.8 F (36.6 C), temperature source Oral, resp. rate 17, height 5\' 2"  (1.575 m), weight 151 lb 3.8 oz (68.6 kg), last menstrual period 09/10/2006, SpO2 98 %.  Filed Weights   03/07/18 0539 03/08/18 0605  Weight: 148 lb (67.1 kg) 151 lb 3.8 oz (68.6 kg)    Labs & Radiologic Studies    CBC Recent Labs    03/06/18 0913 03/08/18 0427  WBC 6.8 7.0  HGB 13.9 11.9*  HCT 40.8 36.5  MCV 95 96.8  PLT 261 416   Basic Metabolic Panel Recent Labs    03/06/18 0913 03/08/18 0427  NA 141 142  K 4.7 4.2  CL 102 113*  CO2 25 22  GLUCOSE 83 106*  BUN 10 12  CREATININE 0.87 0.77  CALCIUM 9.7 8.5*   Liver Function Tests No results for input(s): AST, ALT, ALKPHOS, BILITOT, PROT, ALBUMIN in the last 72 hours. No results for input(s): LIPASE, AMYLASE in the last 72 hours. Cardiac Enzymes No results for input(s): CKTOTAL, CKMB, CKMBINDEX, TROPONINI in the last 72 hours. BNP Invalid input(s): POCBNP D-Dimer No results for input(s): DDIMER in the last 72 hours. Hemoglobin A1C No results for input(s): HGBA1C in the last 72 hours. Fasting Lipid Panel Recent Labs    03/06/18 0913  CHOL 142  HDL 39*  LDLCALC 72  TRIG 153*  CHOLHDL 3.6   Thyroid Function Tests No results for input(s):  TSH, T4TOTAL, T3FREE, THYROIDAB in the last 72 hours.  Invalid input(s): FREET3 _____________  Ct Coronary Morph W/cta Cor W/score W/ca W/cm &/or Wo/cm  Addendum Date: 02/27/2018   ADDENDUM REPORT: 02/27/2018 12:05 CLINICAL DATA:  Chest pain EXAM: Cardiac CTA MEDICATIONS: Sub lingual nitro. 4mg  and lopressor 5mg  TECHNIQUE: The patient was scanned on a Siemens 606 slice scanner. Gantry rotation speed was 270 msecs. Collimation was .2mm. A 100 kV prospective scan was triggered in the descending thoracic aorta at 111 HU's with 5% padding centered around 78% of the R-R interval. Average HR during the scan was 52 bpm. The 3D data set was interpreted on a dedicated work station using MPR, MIP and VRT modes. A total of 80cc of contrast was used. FINDINGS: Non-cardiac: See separate report from [facility]. No significant findings on limited lung and soft tissue windows. Calcium Score: Mild calcium noted in LM and mid LAD  Coronary Arteries: Right dominant with no anomalies LM: Less than 20% calcific stenosis LAD: 50-75% mid LAD mixed plaque D1: Normal D2: Normal Circumflex: Normal OM1: Normal OM2: Normla RCA: Normal PDA: Normal PLA:  Normal IMPRESSION: 1.  Aortic root normal 3.3 cm 2.  Calcium score 6 which is 27 th percentile for age and sex 28.  50-75% mid LAD stenosis study will be sent for FFR CT Jenkins Rouge Electronically Signed   By: Jenkins Rouge M.D.   On: 02/27/2018 12:05   Result Date: 02/27/2018 EXAM: OVER-READ INTERPRETATION  CT CHEST The following report is an over-read performed by radiologist Dr. Vinnie Langton of [facility], [place] on 02/27/2018. This over-read does not include interpretation of cardiac or coronary anatomy or pathology. The coronary calcium score/coronary CTA interpretation by the cardiologist is attached. COMPARISON:  None. FINDINGS: Aortic atherosclerosis. 2 mm nodule in the periphery of the left upper lobe (axial image 11 of series 11). Within the visualized portions  of the thorax there are no other larger suspicious appearing pulmonary nodules or masses, there is no acute consolidative airspace disease, no pleural effusions, no pneumothorax and no lymphadenopathy. Visualized portions of the upper abdomen are unremarkable. There are no aggressive appearing lytic or blastic lesions noted in the visualized portions of the skeleton. Bilateral breast implants are incidentally noted. IMPRESSION: 1. 2 mm left upper lobe nodule, nonspecific but statistically likely benign. No follow-up needed if patient is low-risk. Non-contrast chest CT can be considered in 12 months if patient is high-risk. This recommendation follows the consensus statement: Guidelines for Management of Incidental Pulmonary Nodules Detected on CT Images: From the Fleischner Society 2017; Radiology 2017; 284:228-243. 2.  Aortic Atherosclerosis (ICD10-I70.0). Electronically Signed: By: Vinnie Langton M.D. On: 02/27/2018 11:37   Ct Coronary Fractional Flow Reserve Data Prep  Result Date: 02/27/2018 CLINICAL DATA:  CAD EXAM: FFR-CT TECHNIQUE: The best systolic and diastolic phases of the patients gated cardiac CTA were sent to [facility] for fluid hemodynamic analysis using both normal and sharp kernels CONTRAST:  None COMPARISON:  None FINDINGS: FFR CT was normal in RCA and circumflex FFR CT was markedly abnormal in the mid LAD at. 5 IMPRESSION: Hemodynamically significant mid LAD stenosis with FFR CT .50 Patient will be referred for heart cath with Dr Claiborne Billings Electronically Signed   By: Jenkins Rouge M.D.   On: 02/27/2018 15:26   Disposition   Patient was seen and examined by Dr. Gwenlyn Found who deemed patient as stable for discharge. Follow-up has been arranged. Discharge medications as listed below.   Follow-up Plans & Appointments    Follow-up Information    Almyra Deforest, [place] Follow up on 03/21/2018.   Specialties:  Cardiology, Radiology Why:  Please arrive 15 minutes early for your 10am appointment Contact  information: [address] [place] [address] [phone]          Discharge Instructions    AMB Referral to Cardiac Rehabilitation - Phase II   Complete by:  As directed    Diagnosis:  Coronary Stents   Amb Referral to Cardiac Rehabilitation   Complete by:  As directed    Diagnosis:  Coronary Stents   Diet - low sodium heart healthy   Complete by:  As directed    Increase activity slowly   Complete by:  As directed       Discharge Medications   Allergies as of 03/08/2018      Reactions   Elavil [amitriptyline Hcl] Rash   Sulfur  Rash      Medication List    TAKE these medications   aspirin EC 81 MG tablet Take 1 tablet (81 mg total) by mouth daily. Notes to patient:  Prevents clotting in the stent and heart attack   atorvastatin 80 MG tablet Commonly known as:  LIPITOR Take 1 tablet (80 mg total) by mouth daily at 6 PM. What changed:    medication strength  how much to take  when to take this Notes to patient:  Lowers cholesterol  DOSES CHANGE INCREASED FROM 10MG  TO 80MG    calcium-vitamin D 500-200 MG-UNIT tablet Commonly known as:  OSCAL WITH D Take 1 tablet by mouth daily. Notes to patient:  Supplement    cetirizine 10 MG tablet Commonly known as:  ZYRTEC Take 10 mg by mouth daily as needed for allergies. Notes to patient:  As needed for allergies   clonazePAM 0.5 MG tablet Commonly known as:  KLONOPIN Take 1 tablet (0.5 mg total) by mouth 2 (two) times daily as needed. Notes to patient:  As needed for anxiety   FLUoxetine 20 MG tablet Commonly known as:  PROZAC Take 1 tablet (20 mg total) by mouth daily. Notes to patient:  Depression/anxiety   fluticasone 50 MCG/ACT nasal spray Commonly known as:  FLONASE Place 2 sprays into both nostrils daily. What changed:    when to take this  reasons to take this Notes to patient:  Prevents nasal congestion and allergy symptoms   meclizine 25 MG tablet Commonly known as:   ANTIVERT Take 1 tablet (25 mg total) by mouth 3 (three) times daily as needed for dizziness. Notes to patient:  As needed for dizziness   metoprolol tartrate 25 MG tablet Commonly known as:  LOPRESSOR Take 0.5 tablets (12.5 mg total) by mouth 2 (two) times daily. Notes to patient:  Decreases work of the heart  Lowers heart rate and blood pressure Decreases force of contraction of heart muscle   nitroGLYCERIN 0.4 MG SL tablet Commonly known as:  NITROSTAT Place 1 tablet (0.4 mg total) under the tongue every 5 (five) minutes as needed for chest pain. Notes to patient:  As needed for chest pain    ticagrelor 90 MG Tabs tablet Commonly known as:  BRILINTA Take 1 tablet (90 mg total) by mouth 2 (two) times daily. Notes to patient:  Prevents clotting in the stent and heart attack   ticagrelor 90 MG Tabs tablet Commonly known as:  BRILINTA Take 1 tablet (90 mg total) by mouth 2 (two) times daily. Notes to patient:  DUPLICATE        Aspirin prescribed at discharge?  Yes High Intensity Statin Prescribed? (Lipitor 40-80mg  or Crestor 20-40mg ): Yes Beta Blocker Prescribed? Yes For EF <40%, was ACEI/ARB Prescribed? No: EF>40% ADP Receptor Inhibitor Prescribed? (i.e. Plavix etc.-Includes Medically Managed Patients): Yes For EF <40%, Aldosterone Inhibitor Prescribed? No: EF>40% Was EF assessed during THIS hospitalization? No: Assess prior to admission Was Cardiac Rehab II ordered? (Included Medically managed Patients): Yes   Outstanding Labs/Studies   None  Duration of Discharge Encounter   Greater than 30 minutes including physician time.  Signed, Abigail Butts PA-C 03/08/2018, 12:51 PM  Agree with assessment and plan by Roby Lofts PA-C  Stable for discharge home this morning on dual antiplatelet therapy including aspirin and Brilinta status post mid LAD PCI and drug-eluting stenting.  She does have some ecchymosis in her right forearm.  She will follow-up with Dr. Ellouise Newer as an outpatient.  Lorretta Harp, M.D., [place], [facility], [place] [address] [place], [place]  [address]  [phone] 03/08/2018 12:55 PM

## 2018-03-08 NOTE — Care Management Note (Signed)
Case Management Note  Patient Details  Name: [name] MRN: [id number] Date of Birth: October 30, 1950  Subjective/Objective:  From home s/p stent intervention, will be on brilinta.  Script sent to pharmacy , amt for brilinta is 35.00 per pharmacist.  Informed patient of this information.  She has the 30 day coupon and the 5.00 co pay card.  Her pharmacy has in stock.                 Action/Plan: DC home when ready.  Expected Discharge Date:  03/08/18               Expected Discharge Plan:  Home/Self Care  In-House Referral:     Discharge planning Services  CM Consult, Medication Assistance  Post Acute Care Choice:    Choice offered to:     DME Arranged:    DME Agency:     HH Arranged:    HH Agency:     Status of Service:  Completed, signed off  If discussed at [employer] of Stay Meetings, dates discussed:    Additional Comments:  Zenon Mayo, RN 03/08/2018, 12:02 PM

## 2018-03-08 NOTE — Progress Notes (Signed)
CARDIAC REHAB PHASE I   PRE:  Rate/Rhythm: 70 SR  BP:  Supine: 163/57  Left leg  Sitting:   Standing:    SaO2:   MODE:  Ambulation: 800 ft   POST:  Rate/Rhythm: 75 SR  BP:  Supine: 185/31 left leg  177/52 right leg  Sitting:   Standing:    SaO2: 98%RA 0905-1000 Pt walked 800 ft on RA with steady gait and tolerated well. Stated she had a little chin tingling during walk. To bed after walk. BP taken on legs as documented above.  Reviewed importance of brilinta with stent, NTG use, ex ed, heart healthy food choices and CRP 2. Referred to [facility]. Pt and significant other voiced understanding. Notified RN of BP.   Graylon Good, RN BSN  03/08/2018 9:51 AM

## 2018-03-08 NOTE — Telephone Encounter (Signed)
Pt currently admitted. TOC call for 7/25 

## 2018-03-08 NOTE — Progress Notes (Signed)
Progress Note  Patient Name: [name] Date of Encounter: 03/08/2018  Primary Cardiologist: Dr Ellouise Newer   Subjective   Postop day #1 LAD PCI and drug-eluting stenting.  She did have normal LV function by 2D echo with mild aortic stenosis/insufficiency.  She did complain of some mild jaw pain with ambulation this morning.  She is on dual antiplatelet therapy including aspirin and Brilinta.  Inpatient Medications    Scheduled Meds: . aspirin  81 mg Oral Daily  . atorvastatin  80 mg Oral q1800  . FLUoxetine  20 mg Oral Daily  . sodium chloride flush  3 mL Intravenous Q12H  . ticagrelor  90 mg Oral BID   Continuous Infusions: . sodium chloride 50 mL/hr at 03/08/18 0500  . sodium chloride     PRN Meds: sodium chloride, acetaminophen, clonazePAM, ondansetron (ZOFRAN) IV, sodium chloride flush   Vital Signs    Vitals:   03/07/18 1559 03/07/18 1950 03/08/18 0605 03/08/18 0817  BP: (!) 140/47 (!) 144/35 (!) 158/56 (!) 158/58  Pulse: 62 72 (!) 55 61  Resp: 18 18 18 17   Temp: 97.9 F (36.6 C) 98.3 F (36.8 C) 98.1 F (36.7 C) 97.8 F (36.6 C)  TempSrc:  Oral Oral Oral  SpO2: 96% 97% 98% 98%  Weight:   151 lb 3.8 oz (68.6 kg)   Height:        Intake/Output Summary (Last 24 hours) at 03/08/2018 1059 Last data filed at 03/08/2018 0700 Gross per 24 hour  Intake 1883.93 ml  Output 700 ml  Net 1183.93 ml   Filed Weights   03/07/18 0539 03/08/18 0605  Weight: 148 lb (67.1 kg) 151 lb 3.8 oz (68.6 kg)    Telemetry    Normal sinus rhythm- Personally Reviewed  ECG    Sinus bradycardia 50 without ST or T wave changes.- Personally Reviewed  Physical Exam   GEN: No acute distress.   Neck: No JVD Cardiac: RRR, no murmurs, rubs, or gallops.  Respiratory: Clear to auscultation bilaterally. GI: Soft, nontender, non-distended  MS: No edema; No deformity. Neuro:  Nonfocal  Psych: Normal affect  Extremities: Right radial puncture site appears stable.  There is some  mild ecchymosis in the right forearm.  Labs    Chemistry Recent Labs  Lab 03/06/18 0913 03/08/18 0427  NA 141 142  K 4.7 4.2  CL 102 113*  CO2 25 22  GLUCOSE 83 106*  BUN 10 12  CREATININE 0.87 0.77  CALCIUM 9.7 8.5*  GFRNONAA 70 >60  GFRAA 80 >60  ANIONGAP  --  7     Hematology Recent Labs  Lab 03/06/18 0913 03/08/18 0427  WBC 6.8 7.0  RBC 4.28 3.77*  HGB 13.9 11.9*  HCT 40.8 36.5  MCV 95 96.8  MCH 32.5 31.6  MCHC 34.1 32.6  RDW 13.4 12.6  PLT 261 215    Cardiac EnzymesNo results for input(s): TROPONINI in the last 168 hours. No results for input(s): TROPIPOC in the last 168 hours.   BNPNo results for input(s): BNP, PROBNP in the last 168 hours.   DDimer No results for input(s): DDIMER in the last 168 hours.   Radiology    No results found.  Cardiac Studies   Cardiac catheterization/PCI and stent report (03/07/2018)  Conclusion     Prox LAD lesion is 95% stenosed.  Post intervention, there is a 0% residual stenosis.  A stent was successfully placed.   Single-vessel coronary obstructive disease with a 95%  fibrotic appearing mid LAD stenosis distal to a proximal septal and diagonal vessel.  Normal left circumflex and dominant RCA.  LVEDP 21 mm.  Successful PCI to the mid LAD utilizing Cutting Balloon and DES stenting with a 2.5 x 18 mm Resolute Onyx stent with the 95% stenosis being reduced to 0% evidence for brisk TIMI-3 flow with no evidence for dissection at the completion of the procedure.  RECOMMENDATION:  Recommend uninterrupted dual antiplatelet therapy with Aspirin 81mg  daily and Ticagrelor 90mg  twice daily for a minimum of 12 months (ACS - Class I recommendation).  The patient will continue with low-dose beta-blocker therapy.  Aggressive lipid-lowering therapy with high potency statin.      Patient Profile     67 y.o. female with a history of hyperlipidemia and a CT FFR suggesting a physiologically significant mid LAD  stenosis.  She underwent left heart cath via right radial approach by Dr. Claiborne Billings yesterday revealing a 95% proximal to mid LAD stenosis which was stented with a 2.5 mm x 18 mm long resolute Onyx DES with an excellent result.  She had no other significant CAD.  Assessment & Plan    1: Coronary artery disease- postop day 1 LAD PCI and drug-eluting stenting with a resolute Onyx drug-eluting stent.  This was her only area of coronary stenosis.  She is on aspirin and Brilinta which she will need to be on for at least 12 months uninterrupted.  2: Hyperlipidemia- on statin therapy followed by Dr. Claiborne Billings.  Her LDL today was 72.   Stable for discharge today.  Follow-up with Dr. Claiborne Billings as an outpatient.  For questions or updates, please contact [place] Please consult [email or web address] for contact info under Cardiology/STEMI.      Signed, Quay Burow, MD  03/08/2018, 10:59 AM

## 2018-03-08 NOTE — Telephone Encounter (Signed)
Received page from patient.  She stated that her right forearm has been swelling visibly compared to her left forearm she has some mild discomfort there.  She denies pain with passive range of motion or other signs of neurovascular compromise.  However, given the worsening swelling in the forearm I advised her that it be prudent to be seen in the emergency department for evaluation of this and for careful neurovascular exam.  She agreed to proceed to the emergency department and be seen.  Doylene Canning, MD

## 2018-03-08 NOTE — Discharge Instructions (Signed)

## 2018-03-08 NOTE — Telephone Encounter (Signed)
New Message   [facility] appointment on 8/6 with Almyra Deforest

## 2018-03-08 NOTE — Progress Notes (Signed)
6.  S/W KATRINA @ [facility] # [phone] OPT- 2    BRILINTA  90 MG BID  COVER- YES  CO-PAY- $ 35.00  TIER - 2 DRUG  PRIOR APPROVAL- NO   PREFERRED PHARMACY  YES  [facility]   ON BACK OF PATIENT INS CARD PRICE FOR GENERIC AND BRAND

## 2018-03-09 ENCOUNTER — Emergency Department (HOSPITAL_COMMUNITY): Payer: PRIVATE HEALTH INSURANCE

## 2018-03-09 ENCOUNTER — Other Ambulatory Visit: Payer: Self-pay

## 2018-03-09 ENCOUNTER — Observation Stay (HOSPITAL_COMMUNITY)
Admission: EM | Admit: 2018-03-09 | Discharge: 2018-03-10 | Disposition: A | Discharge status: Home / Self Care | Payer: PRIVATE HEALTH INSURANCE | Attending: Cardiovascular Disease | Admitting: Cardiovascular Disease

## 2018-03-09 ENCOUNTER — Encounter (HOSPITAL_COMMUNITY): Payer: Self-pay | Admitting: *Deleted

## 2018-03-09 DIAGNOSIS — I700 Atherosclerosis of aorta: Secondary | ICD-10-CM | POA: Insufficient documentation

## 2018-03-09 DIAGNOSIS — Z79899 Other long term (current) drug therapy: Secondary | ICD-10-CM | POA: Diagnosis not present

## 2018-03-09 DIAGNOSIS — R079 Chest pain, unspecified: Secondary | ICD-10-CM | POA: Diagnosis present

## 2018-03-09 DIAGNOSIS — F329 Major depressive disorder, single episode, unspecified: Secondary | ICD-10-CM | POA: Diagnosis not present

## 2018-03-09 DIAGNOSIS — Z853 Personal history of malignant neoplasm of breast: Secondary | ICD-10-CM | POA: Insufficient documentation

## 2018-03-09 DIAGNOSIS — R0789 Other chest pain: Principal | ICD-10-CM | POA: Insufficient documentation

## 2018-03-09 DIAGNOSIS — R7989 Other specified abnormal findings of blood chemistry: Secondary | ICD-10-CM | POA: Insufficient documentation

## 2018-03-09 DIAGNOSIS — R51 Headache: Secondary | ICD-10-CM | POA: Insufficient documentation

## 2018-03-09 DIAGNOSIS — Z9013 Acquired absence of bilateral breasts and nipples: Secondary | ICD-10-CM | POA: Insufficient documentation

## 2018-03-09 DIAGNOSIS — I10 Essential (primary) hypertension: Secondary | ICD-10-CM | POA: Diagnosis not present

## 2018-03-09 DIAGNOSIS — I2510 Atherosclerotic heart disease of native coronary artery without angina pectoris: Secondary | ICD-10-CM | POA: Diagnosis not present

## 2018-03-09 DIAGNOSIS — M1711 Unilateral primary osteoarthritis, right knee: Secondary | ICD-10-CM | POA: Diagnosis not present

## 2018-03-09 DIAGNOSIS — Z955 Presence of coronary angioplasty implant and graft: Secondary | ICD-10-CM | POA: Insufficient documentation

## 2018-03-09 DIAGNOSIS — I25110 Atherosclerotic heart disease of native coronary artery with unstable angina pectoris: Secondary | ICD-10-CM | POA: Insufficient documentation

## 2018-03-09 DIAGNOSIS — E7800 Pure hypercholesterolemia, unspecified: Secondary | ICD-10-CM | POA: Insufficient documentation

## 2018-03-09 DIAGNOSIS — M1612 Unilateral primary osteoarthritis, left hip: Secondary | ICD-10-CM | POA: Insufficient documentation

## 2018-03-09 DIAGNOSIS — K219 Gastro-esophageal reflux disease without esophagitis: Secondary | ICD-10-CM | POA: Diagnosis not present

## 2018-03-09 DIAGNOSIS — M810 Age-related osteoporosis without current pathological fracture: Secondary | ICD-10-CM | POA: Insufficient documentation

## 2018-03-09 DIAGNOSIS — F411 Generalized anxiety disorder: Secondary | ICD-10-CM | POA: Diagnosis not present

## 2018-03-09 DIAGNOSIS — Z888 Allergy status to other drugs, medicaments and biological substances status: Secondary | ICD-10-CM | POA: Insufficient documentation

## 2018-03-09 DIAGNOSIS — I2583 Coronary atherosclerosis due to lipid rich plaque: Secondary | ICD-10-CM

## 2018-03-09 DIAGNOSIS — E785 Hyperlipidemia, unspecified: Secondary | ICD-10-CM | POA: Diagnosis present

## 2018-03-09 DIAGNOSIS — R011 Cardiac murmur, unspecified: Secondary | ICD-10-CM | POA: Diagnosis not present

## 2018-03-09 DIAGNOSIS — R030 Elevated blood-pressure reading, without diagnosis of hypertension: Secondary | ICD-10-CM

## 2018-03-09 DIAGNOSIS — Z882 Allergy status to sulfonamides status: Secondary | ICD-10-CM | POA: Insufficient documentation

## 2018-03-09 DIAGNOSIS — R778 Other specified abnormalities of plasma proteins: Secondary | ICD-10-CM

## 2018-03-09 DIAGNOSIS — R002 Palpitations: Secondary | ICD-10-CM

## 2018-03-09 DIAGNOSIS — I200 Unstable angina: Secondary | ICD-10-CM

## 2018-03-09 DIAGNOSIS — Z7982 Long term (current) use of aspirin: Secondary | ICD-10-CM | POA: Insufficient documentation

## 2018-03-09 DIAGNOSIS — Z87442 Personal history of urinary calculi: Secondary | ICD-10-CM | POA: Diagnosis not present

## 2018-03-09 HISTORY — DX: Essential (primary) hypertension: I10

## 2018-03-09 LAB — BASIC METABOLIC PANEL
ANION GAP: 10 (ref 5–15)
BUN: 8 mg/dL (ref 8–23)
CALCIUM: 9.3 mg/dL (ref 8.9–10.3)
CHLORIDE: 107 mmol/L (ref 98–111)
CO2: 23 mmol/L (ref 22–32)
Creatinine, Ser: 0.75 mg/dL (ref 0.44–1.00)
GFR calc non Af Amer: >60 mL/min (ref >60)
Glucose, Bld: 99 mg/dL (ref 70–99)
Potassium: 4.2 mmol/L (ref 3.5–5.1)
Sodium: 140 mmol/L (ref 135–145)

## 2018-03-09 LAB — I-STAT TROPONIN, ED
TROPONIN I, POC: 0.1 ng/mL — AB (ref 0.00–0.08)
TROPONIN I, POC: 0.11 ng/mL — AB (ref 0.00–0.08)

## 2018-03-09 LAB — TROPONIN I: TROPONIN I: 0.1 ng/mL — AB (ref <0.03)

## 2018-03-09 LAB — CBC
HCT: 39.5 % (ref 36.0–46.0)
HEMOGLOBIN: 12.9 g/dL (ref 12.0–15.0)
MCH: 31.5 pg (ref 26.0–34.0)
MCHC: 32.7 g/dL (ref 30.0–36.0)
MCV: 96.3 fL (ref 78.0–100.0)
Platelets: 210 10*3/uL (ref 150–400)
RBC: 4.1 MIL/uL (ref 3.87–5.11)
RDW: 12.4 % (ref 11.5–15.5)
WBC: 7.1 10*3/uL (ref 4.0–10.5)

## 2018-03-09 LAB — HEPARIN LEVEL (UNFRACTIONATED): HEPARIN UNFRACTIONATED: 0.33 [IU]/mL (ref 0.30–0.70)

## 2018-03-09 MED ORDER — ONDANSETRON HCL 4 MG/2ML IJ SOLN: 4.0000 mg | Freq: Four times a day (QID) | INTRAMUSCULAR | Status: DC | PRN

## 2018-03-09 MED ORDER — HEPARIN BOLUS VIA INFUSION
3000.0000 [IU] | Freq: Once | INTRAVENOUS | Status: AC
Start: 2018-03-09 — End: 2018-03-09
  Administered 2018-03-09: 3000 [IU] via INTRAVENOUS
  Filled 2018-03-09: qty 3000

## 2018-03-09 MED ORDER — NITROGLYCERIN 0.4 MG SL SUBL: 0.4000 mg | SUBLINGUAL_TABLET | SUBLINGUAL | Status: DC | PRN

## 2018-03-09 MED ORDER — CLONAZEPAM 0.5 MG PO TABS: 0.5000 mg | ORAL_TABLET | Freq: Two times a day (BID) | ORAL | Status: DC | PRN

## 2018-03-09 MED ORDER — HEPARIN (PORCINE) IN NACL 100-0.45 UNIT/ML-% IJ SOLN
850.0000 [IU]/h | INTRAMUSCULAR | Status: DC
  Administered 2018-03-09: 750 [IU]/h via INTRAVENOUS
  Filled 2018-03-09: qty 250

## 2018-03-09 MED ORDER — AMLODIPINE BESYLATE 5 MG PO TABS
5.0000 mg | ORAL_TABLET | Freq: Every day | ORAL | Status: DC
  Administered 2018-03-09 – 2018-03-10 (×2): 5 mg via ORAL
  Filled 2018-03-09 (×2): qty 1

## 2018-03-09 MED ORDER — ASPIRIN EC 81 MG PO TBEC: 81.0000 mg | DELAYED_RELEASE_TABLET | Freq: Every day | ORAL | Status: DC

## 2018-03-09 MED ORDER — METOPROLOL TARTRATE 12.5 MG HALF TABLET
12.5000 mg | ORAL_TABLET | Freq: Every day | ORAL | Status: DC
  Administered 2018-03-09 – 2018-03-10 (×2): 12.5 mg via ORAL
  Filled 2018-03-09 (×2): qty 1

## 2018-03-09 MED ORDER — FLUOXETINE HCL 20 MG PO CAPS
20.0000 mg | ORAL_CAPSULE | Freq: Every day | ORAL | Status: DC
  Administered 2018-03-10: 20 mg via ORAL
  Filled 2018-03-09 (×2): qty 1

## 2018-03-09 MED ORDER — ATORVASTATIN CALCIUM 80 MG PO TABS
80.0000 mg | ORAL_TABLET | Freq: Every day | ORAL | Status: DC
  Administered 2018-03-09: 80 mg via ORAL
  Filled 2018-03-09: qty 1

## 2018-03-09 MED ORDER — ASPIRIN EC 81 MG PO TBEC
81.0000 mg | DELAYED_RELEASE_TABLET | Freq: Every day | ORAL | Status: DC
  Administered 2018-03-10: 81 mg via ORAL
  Filled 2018-03-09: qty 1

## 2018-03-09 MED ORDER — ACETAMINOPHEN 325 MG PO TABS: 650.0000 mg | ORAL_TABLET | ORAL | Status: DC | PRN

## 2018-03-09 MED ORDER — TICAGRELOR 90 MG PO TABS
90.0000 mg | ORAL_TABLET | Freq: Two times a day (BID) | ORAL | Status: DC
  Administered 2018-03-09 – 2018-03-10 (×2): 90 mg via ORAL
  Filled 2018-03-09 (×2): qty 1

## 2018-03-09 NOTE — ED Triage Notes (Signed)
Pt in c/o intermittent chest tightness and dizziness, had a stent placed on 7/23 and states she has not felt right since being home

## 2018-03-09 NOTE — ED Triage Notes (Signed)
Pt took nitro PTA

## 2018-03-09 NOTE — Telephone Encounter (Signed)
SEE PREVIOUS NOTE./CY 

## 2018-03-09 NOTE — ED Notes (Signed)
Had heart cath x2 days ago (rgt radial approach) - 1 stent placed; states this am had 2 episodes of heart racing and "back, arm and jaw twinges"; on ASA and Brilenta for stent; took NTG PTA - no resolution of symptoms; currently asymptomatic; skin warm, dry

## 2018-03-09 NOTE — H&P (Addendum)
Cardiology Admission History and Physical:   Patient ID: [name]; MRN: [id number]; DOB: 06/23/1951   Admission date: 03/09/2018  Primary Care Provider: [name], MD Primary Cardiologist: Shelva Majestic, MD  Primary Electrophysiologist:  none  Chief Complaint:  palpitations  Patient Profile:   [name] is a 67 y.o. female with a history of CAD s/p PCI/DES to LAD 03/07/18 and HLD, who presents with multiple complaints including palpitations, high blood pressure, and vague chest discomfort.   History of Present Illness:   [name] was discharged from the hospital yesterday after admission 03/07/18 for [facility] which revealed 95% stenosis of the proximal LAD managed with PCI/DES. She reports compliance with her DAPT (ASA and brilinta BID) since discharge. Last night she called the cardiologist on call reporting right wrist swelling at the cath site, ultimately deciding that she was overreacting and did not seek further medical attention for this concern.   She reported waking up around 2am with sudden onset palpitations with associated SOB which lasted ~30 seconds and resolved spontaneously. She did not check her pulse at that time. She was able to fall asleep again but woke around 6am with recurrent symptoms. She reported taking her blood pressure at home with an SBP in the 190s. Given BP elevation and vague symptoms that morning, she presented to Dr. Evette Georges office seeking further evaluation. She began feeling poorly in the waiting room, reporting some mild chest discomfort and decided to present to the ED instead. She reported taking SL nitro en route to the ED without significant change in symptoms. She has continued to have intermittent jaw tightness consistent with her symptoms prior to stent placement. She currently reports a headache but denies ongoing chest pain, palpitations, or SOB.   ED course: Hypertensive, otherwise VSS. Labs notable for electrolytes wnl, Cr 0.75, CBC  wnl, trop 0.10>0.11. EKG with NSR, no STE/D, no TWI. CXR is without acute findings. Cardiology asked to evaluate.    Past Medical History:  Diagnosis Date  . Allergy    "dust mite residue, mold, mildew, grapefruit, chili peppers, cat dander, some weeds" (03/07/2018)  . Anxiety   . Arthritis    "minor; right knee, left hip" (03/07/2018)  . Bruises easily   . Cancer of left breast ([place])    double mastectomy 01/26/2011  . Childhood asthma   . Contact lens/glasses fitting   . Coronary artery disease   . Depression   . GERD (gastroesophageal reflux disease)    "at times" (03/07/2018)  . Heart murmur   . Hiatal hernia   . High cholesterol   . History of kidney stones    "lots; no OR" (03/07/2018)  . Osteoporosis   . Rectal bleeding    hemorroid  . Ringing in ears, bilateral    "since ~ 06/2017" (03/07/2018)  . Sinus drainage     Past Surgical History:  Procedure Laterality Date  . APPENDECTOMY    . BREAST LUMPECTOMY W/ NEEDLE LOCALIZATION Left 10/05/2010   lumpectomy  . BREAST SURGERY    . CORONARY STENT INTERVENTION N/A 03/07/2018   Procedure: CORONARY STENT INTERVENTION;  Surgeon: Troy Sine, MD;  Location: [facility];  Service: Cardiovascular;  Laterality: N/A;  . LEFT HEART CATH AND CORONARY ANGIOGRAPHY N/A 03/07/2018   Procedure: LEFT HEART CATH AND CORONARY ANGIOGRAPHY;  Surgeon: Troy Sine, MD;  Location: [facility];  Service: Cardiovascular;  Laterality: N/A;  . MASTECTOMY Right 01/26/2011  . MASTECTOMY COMPLETE / SIMPLE  W/ SENTINEL NODE BIOPSY Left 01/26/2011  . OVARIAN CYST SURGERY  1997, 2001, 2008     Medications Prior to Admission: Prior to Admission medications   Medication Sig Start Date End Date Taking? Authorizing Provider  Ascorbic Acid (VITAMIN C) 100 MG tablet Take 100 mg by mouth daily as needed.   Yes [provider]  aspirin EC 81 MG tablet Take 1 tablet (81 mg total) by mouth daily. 02/27/18  Yes Troy Sine, MD    atorvastatin (LIPITOR) 10 MG tablet Take 10 mg by mouth daily.   Yes [provider]  cetirizine (ZYRTEC) 10 MG tablet Take 10 mg by mouth daily as needed for allergies.    Yes [provider]  clonazePAM (KLONOPIN) 0.5 MG tablet Take 1 tablet (0.5 mg total) by mouth 2 (two) times daily as needed. Patient taking differently: Take 0.5 mg by mouth daily as needed for anxiety.  01/24/18  Yes [name], MD  FLUoxetine (PROZAC) 20 MG tablet Take 1 tablet (20 mg total) by mouth daily. 09/15/17  Yes [name], MD  fluticasone (FLONASE) 50 MCG/ACT nasal spray Place 2 sprays into both nostrils daily. Patient taking differently: Place 2 sprays into both nostrils daily as needed for allergies.  06/17/16  Yes [name], MD  metoprolol tartrate (LOPRESSOR) 25 MG tablet Take 0.5 tablets (12.5 mg total) by mouth daily. 03/08/18  Yes Kroeger, Daleen Snook M., PA-C  nitroGLYCERIN (NITROSTAT) 0.4 MG SL tablet Place 1 tablet (0.4 mg total) under the tongue every 5 (five) minutes as needed for chest pain. 02/27/18 05/28/18 Yes Troy Sine, MD  ticagrelor (BRILINTA) 90 MG TABS tablet Take 1 tablet (90 mg total) by mouth 2 (two) times daily. 03/08/18  Yes Kroeger, Daleen Snook M., PA-C  atorvastatin (LIPITOR) 80 MG tablet Take 1 tablet (80 mg total) by mouth daily at 6 PM. Patient not taking: Reported on 03/09/2018 03/08/18   Abigail Butts., PA-C  calcium-vitamin D (OSCAL WITH D) 500-200 MG-UNIT per tablet Take 1 tablet by mouth daily.    [provider]  meclizine (ANTIVERT) 25 MG tablet Take 1 tablet (25 mg total) by mouth 3 (three) times daily as needed for dizziness. Patient not taking: Reported on 03/09/2018 01/24/18   [name], MD     Allergies:    Allergies  Allergen Reactions  . Elavil [Amitriptyline Hcl] Rash  . Sulfur Rash    Social History:   Social History   Socioeconomic History  . Marital status: Divorced    Spouse name: Not on file  . Number of  children: Not on file  . Years of education: Not on file  . Highest education level: Not on file  Occupational History  . Not on file  Social Needs  . Financial resource strain: Not on file  . Food insecurity:    Worry: Not on file    Inability: Not on file  . Transportation needs:    Medical: Not on file    Non-medical: Not on file  Tobacco Use  . Smoking status: Never Smoker  . Smokeless tobacco: Never Used  Substance and Sexual Activity  . Alcohol use: Yes    Comment: 03/07/2018 "probably once/month"  . Drug use: No  . Sexual activity: Not on file  Lifestyle  . Physical activity:    Days per week: Not on file    Minutes per session: Not on file  . Stress: Not on file  Relationships  . Social  connections:    Talks on phone: Not on file    Gets together: Not on file    Attends religious service: Not on file    Active member of club or organization: Not on file    Attends meetings of clubs or organizations: Not on file    Relationship status: Not on file  . Intimate partner violence:    Fear of current or ex partner: Not on file    Emotionally abused: Not on file    Physically abused: Not on file    Forced sexual activity: Not on file  Other Topics Concern  . Not on file  Social History Narrative  . Not on file    Family History:   The patient's family history includes Cancer in her brother and sister; Diabetes in her sister; Heart Problems in her mother; Heart failure in her paternal grandfather and paternal grandmother; Hyperlipidemia in her mother; Hypertension in her father and mother.    ROS:  Please see the history of present illness.  All other ROS reviewed and negative.     Physical Exam/Data:   Vitals:   03/09/18 1003 03/09/18 1030 03/09/18 1100 03/09/18 1230  BP: (!) 171/67 (!) 154/56 (!) 154/68 (!) 145/66  Pulse: 64 60 (!) 59 65  Resp: 20 13 13 20   Temp:      TempSrc:      SpO2: 99% 98% 98% 98%   No intake or output data in the 24 hours ending  03/09/18 1322 There were no vitals filed for this visit. There is no height or weight on file to calculate BMI.  General:  Well nourished, well developed, laying in bed in no acute distress HEENT: sclera anicteric Neck: no JVD Vascular: No carotid bruits; distal pulses 2+ bilaterally  Cardiac:  normal S1, S2; RRR; no murmurs, gallops, or rubs; R radial cath site with some ecchymosis but no significant swelling or hematoma, dressing C/D/I Lungs:  clear to auscultation bilaterally, no wheezing, rhonchi or rales  Abd: soft, nontender, no hepatomegaly  Ext: no edema Musculoskeletal:  No deformities, BUE and BLE strength normal and equal Skin: warm and dry  Neuro:  CNs 2-12 intact, no focal abnormalities noted Psych:  Normal affect    EKG:  NSR no STE/D, no TWI  Relevant CV Studies: Left Heart Catheterization 03/07/18:  Prox LAD lesion is 95% stenosed.  Post intervention, there is a 0% residual stenosis.  A stent was successfully placed.   Single-vessel coronary obstructive disease with a 95% fibrotic appearing mid LAD stenosis distal to a proximal septal and diagonal vessel.  Normal left circumflex and dominant RCA.  LVEDP 21 mm.  Successful PCI to the mid LAD utilizing Cutting Balloon and DES stenting with a 2.5 x 18 mm Resolute Onyx stent with the 95% stenosis being reduced to 0% evidence for brisk TIMI-3 flow with no evidence for dissection at the completion of the procedure.  RECOMMENDATION:  Recommend uninterrupted dual antiplatelet therapy with Aspirin 81mg  daily and Ticagrelor 90mg  twice daily for a minimum of 12 months (ACS - Class I recommendation).  The patient will continue with low-dose beta-blocker therapy.  Aggressive lipid-lowering therapy with high potency statin.   Laboratory Data:  Chemistry Recent Labs  Lab 03/08/18 0427 03/09/18 1000  NA 142 140  K 4.2 4.2  CL 113* 107  CO2 22 23  GLUCOSE 106* 99  BUN 12 8  CREATININE 0.77 0.75  CALCIUM 8.5*  9.3  GFRNONAA >60 >60  GFRAA >  60 >60  ANIONGAP 7 10    No results for input(s): PROT, ALBUMIN, AST, ALT, ALKPHOS, BILITOT in the last 168 hours. Hematology Recent Labs  Lab 03/08/18 0427 03/09/18 1000  WBC 7.0 7.1  RBC 3.77* 4.10  HGB 11.9* 12.9  HCT 36.5 39.5  MCV 96.8 96.3  MCH 31.6 31.5  MCHC 32.6 32.7  RDW 12.6 12.4  PLT 215 210   Cardiac EnzymesNo results for input(s): TROPONINI in the last 168 hours.  Recent Labs  Lab 03/09/18 1008  TROPIPOC 0.10*    BNPNo results for input(s): BNP, PROBNP in the last 168 hours.  DDimer No results for input(s): DDIMER in the last 168 hours.  Radiology/Studies:  Dg Chest 2 View  Result Date: 03/09/2018 CLINICAL DATA:  Tight sensation in the left jaw all and face with pulling in the mid back and extending into the left arm since last night. History of coronary artery disease with stent placement 2 days ago. EXAM: CHEST - 2 VIEW COMPARISON:  Chest x-ray of October 01, 2010 FINDINGS: The lungs are well-expanded. There is no focal infiltrate. There is no pleural effusion or pneumothorax. The heart and pulmonary vascularity are normal. The mediastinum is normal in width. There is faint calcification in the wall of the aortic arch. The bony thorax exhibits no acute abnormality. IMPRESSION: There is no CHF, pneumonia, nor other acute cardiopulmonary abnormality. Electronically Signed   By: David  Martinique M.D.   On: 03/09/2018 09:39    Assessment and Plan:   1. CP in patient with CAD and recent intervention: patient presented with multiple vague complaints including palpitations associated with SOB, some chest tightness, and elevated blood pressures. She underwent LHC 7/23 with PCI/DES to LAD and was not noted to have any other significant disease. She was started on ASA and brilinta and reports compliance. She was found to have an elevated troponin of 0.1 with flat trend of 0.11 at this time. EKG without ischemic changes. Unlikely to have formed a  blockage in her stent and no other disease to cause a problem. Possible she is having coronary vasospasms.  - Will start a heparin gtt - Will cycle troponin x3 or to peak - Will start amlodipine 5mg  daily for possible vasospasms and to assist with BP.  - Continue ASA, brilinta, and statin - If troponin trend remains flat, may consider coronary CTA to further evaluate anatomy surrounding stent, however if troponin increases, will likely need a repeat LHC.   2. Elevated blood pressure: prior to last admission patient reported low/normal BP's. They have been persistently elevated in hospital - possible there is an anxiety component.  - Continue metoprolol  - Will start amlodipine 5mg  daily  3. HLD: LDL near goal at 72 on last admission - Continue statin  4. Palpitations: reported a couple episodes of her heart racing overnight. No evidence of arrhythmia on telemetry last admission and no prior history.  - Will monitor on telemetry overnight  - Continue metoprolol   Severity of Illness: The appropriate patient status for this patient is OBSERVATION. Observation status is judged to be reasonable and necessary in order to provide the required intensity of service to ensure the patient's safety. The patient's presenting symptoms, physical exam findings, and initial radiographic and laboratory data in the context of their medical condition is felt to place them at decreased risk for further clinical deterioration. Furthermore, it is anticipated that the patient will be medically stable for discharge from the hospital within 2 midnights  of admission. The following factors support the patient status of observation.   " The patient's presenting symptoms include chest pain, palpitations. " The physical exam findings include right radial cath site with mild ecchymosis. " The initial radiographic and laboratory data are troponin elevated to 0.11.     For questions or updates, please contact [place] Please consult [email or web address] for contact info under Cardiology/STEMI.    Signed, Abigail Butts, PA-C  03/09/2018 1:22 PM   Agree with assessment and plan by Roby Lofts PA-C  [name] returns today after being discharged yesterday, one day after mid LAD stenting by Dr. Claiborne Billings.  She had 95% fairly focal mid LAD lesion which was found on CT FFR with some atypical symptoms notable for back and jaw pain.  She has recently developed some hypertension as well.  After being discharged home she noted some back jaw and arm pain.  She is also had some tachypalpitations.  She was sent home on dual antiplatelet therapy including aspirin and Brilinta which she took as directed.  Her exam today is benign.  Her EKG shows no acute changes and her trip troponins are mildly elevated but low and flat in the 0.1 range.  Her exam is benign.  I am unsure the etiology of her symptoms or the significance of her low flat troponin curve.  I am going to admit her to telemetry for observation we will start IV heparin presumptively and cycle her enzymes.  I am also going to initiate amlodipine for hypertension and potential vasospasm.  If her enzymes trend upward she will need repeat cath.  If they remain low and flat and she is asymptomatic she can be discharged home.  If she continues to have symptoms she may need a repeat CTA to further evaluate.  Lorretta Harp, M.D., [place], [facility], [place] [address]. [place], [place]  [address]  [phone] 03/09/2018 3:18 PM

## 2018-03-09 NOTE — Progress Notes (Addendum)
[place] for heparin Indication: chest pain/ACS  Allergies  Allergen Reactions  . Elavil [Amitriptyline Hcl] Rash  . Sulfur Rash    Patient Measurements: TBW 68.6 kg Heparin Dosing Weight: 64 kg  Vital Signs: Temp: 98.2 F (36.8 C) (07/25 1920) Temp Source: Oral (07/25 1920) BP: 130/66 (07/25 1920) Pulse Rate: 57 (07/25 1920)  Labs: Recent Labs    03/08/18 0427 03/09/18 1000 03/09/18 1859 03/09/18 2218  HGB 11.9* 12.9  --   --   HCT 36.5 39.5  --   --   PLT 215 210  --   --   HEPARINUNFRC  --   --   --  0.33  CREATININE 0.77 0.75  --   --   TROPONINI  --   --  0.10*  --     Estimated Creatinine Clearance: 62.8 mL/min (by C-G formula based on SCr of 0.75 mg/dL).   Medical History: Past Medical History:  Diagnosis Date  . Allergy    "dust mite residue, mold, mildew, grapefruit, chili peppers, cat dander, some weeds" (03/07/2018)  . Anxiety   . Arthritis    "minor; right knee, left hip" (03/07/2018)  . Bruises easily   . Cancer of left breast ([place])    double mastectomy 01/26/2011  . Childhood asthma   . Contact lens/glasses fitting   . Coronary artery disease   . Depression   . GERD (gastroesophageal reflux disease)    "at times" (03/07/2018)  . Heart murmur   . Hiatal hernia   . High cholesterol   . History of kidney stones    "lots; no OR" (03/07/2018)  . Osteoporosis   . Rectal bleeding    hemorroid  . Ringing in ears, bilateral    "since ~ 06/2017" (03/07/2018)  . Sinus drainage    Assessment: 67 yo F presents on 7/25 with chest palpitations. Pharmacy consulted to start heparin. No anticoag PTA but is on aspirin and Brilinta. CBC stable. Troponin 0.11. Initial heparin level therapeutic  Goal of Therapy:  Heparin level 0.3-0.7 units/ml Monitor platelets by anticoagulation protocol: Yes   Plan:  Continue heparin gtt at 750 units/hr Monitor daily heparin level, CBC, s/s of bleed  [name]  [place] 03/09/2018,11:43 PM  Addum:  AM heparin level 0.27 units/ml.  Increase drip to 850 units/hr.  Check heparin level 6 hours after rate change

## 2018-03-09 NOTE — Telephone Encounter (Signed)
Pt walked into office and was instructed to go to ED for eval./cy

## 2018-03-09 NOTE — ED Provider Notes (Signed)
[facility] EMERGENCY DEPARTMENT Provider Note   CSN: [id number] Arrival date & time: 03/09/18  0907     History   Chief Complaint Chief Complaint  Patient presents with  . Chest Pain    HPI [name] is a 67 y.o. female with history of CAD, GERD, HLD, unstable angina, who presents with chest tightness and dizziness.  Patient had a left heart cath on 03/07/18 that showed single-vessel coronary obstructive disease with a 95% fibrotic appearing mid LAD stenosis distal to a proximal septal and diagonal vessel. Percutaneous coronary intervention performed with balloon an stenting. Patient was discharged yesterday without complication. Patient reports that around 3-4 AM she woke up with palpitations and right arm pain which lasted several seconds. She also reports several episodes of bilateral jaw pain and chest tightness (last episode around 8:00-9:00 AM).  She states that she went to the cardiology clinic to have her blood pressure checked. She reports that her systolic blood pressure was in the 170's and she was recommended to go to the ED. She did not see the Cardiologist. Prior to the heart cath, she reports a several month history of bilateral jaw pain and back pain. She reports that her current symptoms are similar to what she has had in the past. She is currently asymptomatic.   Of note, she states that her right forearm had been visibly swollen after the procedure. She denies pain with passive range of notion. She states that the swelling had decreased and she has more bruising today.  Previous CV procedures: 01/02/18 Echo: LV EF 55-60%; Grade 2 diastolic dysfunction. Sclerotic aortic valve with mild regurgitation. 11/18/17: LE [place] Evidence of chronic venous insufficiency in left leg. No signs of DVT bilaterally.    Past Medical History:  Diagnosis Date  . Allergy    "dust mite residue, mold, mildew, grapefruit, chili peppers, cat dander, some weeds"  (03/07/2018)  . Anxiety   . Arthritis    "minor; right knee, left hip" (03/07/2018)  . Bruises easily   . Cancer of left breast ([place])    double mastectomy 01/26/2011  . Childhood asthma   . Contact lens/glasses fitting   . Coronary artery disease   . Depression   . GERD (gastroesophageal reflux disease)    "at times" (03/07/2018)  . Heart murmur   . Hiatal hernia   . High cholesterol   . History of kidney stones    "lots; no OR" (03/07/2018)  . Osteoporosis   . Rectal bleeding    hemorroid  . Ringing in ears, bilateral    "since ~ 06/2017" (03/07/2018)  . Sinus drainage     Patient Active Problem List   Diagnosis Date Noted  . Hyperlipidemia 03/08/2018  . Coronary artery disease involving native coronary artery of native heart with unstable angina pectoris ([place]) 03/07/2018  . Unstable angina ([place])   . Cough 01/02/2013  . Anxiety state, unspecified 01/02/2013  . Breast cancer (DCIS), stage 0, Left, receptor +, dx 2012 09/08/2010    Past Surgical History:  Procedure Laterality Date  . APPENDECTOMY    . BREAST LUMPECTOMY W/ NEEDLE LOCALIZATION Left 10/05/2010   lumpectomy  . BREAST SURGERY    . CORONARY STENT INTERVENTION N/A 03/07/2018   Procedure: CORONARY STENT INTERVENTION;  Surgeon: Troy Sine, MD;  Location: [facility];  Service: Cardiovascular;  Laterality: N/A;  . LEFT HEART CATH AND CORONARY ANGIOGRAPHY N/A 03/07/2018   Procedure: LEFT HEART CATH AND CORONARY ANGIOGRAPHY;  Surgeon: Troy Sine, MD;  Location: [facility];  Service: Cardiovascular;  Laterality: N/A;  . MASTECTOMY Right 01/26/2011  . MASTECTOMY COMPLETE / SIMPLE W/ SENTINEL NODE BIOPSY Left 01/26/2011  . OVARIAN CYST SURGERY  1997, 2001, 2008     OB History    Gravida      Para      Term      Preterm      AB      Living  0     SAB      TAB      Ectopic      Multiple      Live Births               Home Medications    Prior to Admission medications    Medication Sig Start Date End Date Taking? Authorizing Provider  aspirin EC 81 MG tablet Take 1 tablet (81 mg total) by mouth daily. 02/27/18   Troy Sine, MD  atorvastatin (LIPITOR) 80 MG tablet Take 1 tablet (80 mg total) by mouth daily at 6 PM. 03/08/18   Kroeger, Lorelee Cover., PA-C  calcium-vitamin D (OSCAL WITH D) 500-200 MG-UNIT per tablet Take 1 tablet by mouth daily.    [provider]  cetirizine (ZYRTEC) 10 MG tablet Take 10 mg by mouth daily as needed for allergies.     [provider]  clonazePAM (KLONOPIN) 0.5 MG tablet Take 1 tablet (0.5 mg total) by mouth 2 (two) times daily as needed. 01/24/18   [name], MD  FLUoxetine (PROZAC) 20 MG tablet Take 1 tablet (20 mg total) by mouth daily. 09/15/17   [name], MD  fluticasone (FLONASE) 50 MCG/ACT nasal spray Place 2 sprays into both nostrils daily. Patient taking differently: Place 2 sprays into both nostrils daily as needed for allergies.  06/17/16   [name], MD  meclizine (ANTIVERT) 25 MG tablet Take 1 tablet (25 mg total) by mouth 3 (three) times daily as needed for dizziness. 01/24/18   [name], MD  metoprolol tartrate (LOPRESSOR) 25 MG tablet Take 0.5 tablets (12.5 mg total) by mouth daily. 03/08/18   Kroeger, Lorelee Cover., PA-C  nitroGLYCERIN (NITROSTAT) 0.4 MG SL tablet Place 1 tablet (0.4 mg total) under the tongue every 5 (five) minutes as needed for chest pain. 02/27/18 05/28/18  Troy Sine, MD  ticagrelor (BRILINTA) 90 MG TABS tablet Take 1 tablet (90 mg total) by mouth 2 (two) times daily. 03/08/18   Kroeger, Lorelee Cover., PA-C  ticagrelor (BRILINTA) 90 MG TABS tablet Take 1 tablet (90 mg total) by mouth 2 (two) times daily. 03/08/18   Kroeger, Lorelee Cover., PA-C    Family History Family History  Problem Relation Age of Onset  . Hypertension Mother   . Heart Problems Mother   . Hyperlipidemia Mother   . Hypertension Father   . Heart failure Paternal Grandmother   . Heart  failure Paternal Grandfather   . Cancer Brother   . Cancer Sister   . Diabetes Sister     Social History Social History   Tobacco Use  . Smoking status: Never Smoker  . Smokeless tobacco: Never Used  Substance Use Topics  . Alcohol use: Yes    Comment: 03/07/2018 "probably once/month"  . Drug use: No     Allergies   Elavil [amitriptyline hcl] and Sulfur   Review of Systems Review of Systems  Constitutional: Negative for activity change, appetite change and  fever.  HENT: Negative for congestion and sore throat.   Eyes: Negative for visual disturbance.  Respiratory: Positive for chest tightness. Negative for cough and shortness of breath.   Cardiovascular: Positive for palpitations. Negative for chest pain and leg swelling.  Gastrointestinal: Negative for abdominal pain, constipation, diarrhea, nausea and vomiting.  Genitourinary: Negative for dysuria, hematuria and urgency.  Musculoskeletal: Negative for back pain and neck pain.       Bilateral jaw pain. Pain to the back of the left arm.   Skin:       Bruising of the right forearm.   Neurological: Negative for dizziness, weakness, light-headedness, numbness and headaches.  All other systems reviewed and are negative.    Physical Exam Updated Vital Signs BP 129/68 (BP Location: Right Arm)   Pulse 65   Temp 98 F (36.7 C) (Oral)   Resp 20   LMP 09/10/2006   SpO2 100%   Physical Exam  Constitutional: She appears well-developed and well-nourished.  HENT:  Head: Normocephalic and atraumatic.  Eyes: EOM are normal.  Neck: Normal range of motion.  Cardiovascular: Normal rate, regular rhythm, intact distal pulses and normal pulses.  Pulmonary/Chest: Effort normal and breath sounds normal.  Abdominal: Soft. Bowel sounds are normal.  Musculoskeletal: Normal range of motion.       Arms:      Right lower leg: Normal. She exhibits no tenderness and no edema.       Left lower leg: Normal. She exhibits no tenderness and  no edema.  Neurological: She is alert.  Skin: Skin is warm and dry.  Psychiatric: She has a normal mood and affect. Her behavior is normal.  Nursing note and vitals reviewed.    ED Treatments / Results  Labs (all labs ordered are listed, but only abnormal results are displayed) Labs Reviewed  I-STAT TROPONIN, ED - Abnormal; Notable for the following components:      Result Value   Troponin i, poc 0.10 (*)    All other components within normal limits  I-STAT TROPONIN, ED - Abnormal; Notable for the following components:   Troponin i, poc 0.11 (*)    All other components within normal limits  BASIC METABOLIC PANEL  CBC    EKG EKG Interpretation  Date/Time:  Thursday March 09 2018 09:16:13 EDT Ventricular Rate:  69 PR Interval:  130 QRS Duration: 70 QT Interval:  408 QTC Calculation: 437 R Axis:   60 Text Interpretation:  Normal sinus rhythm Normal ECG No significant change since last tracing Confirmed by Theotis Burrow [phone]) on 03/09/2018 9:36:06 AM   Radiology Dg Chest 2 View  Result Date: 03/09/2018 CLINICAL DATA:  Tight sensation in the left jaw all and face with pulling in the mid back and extending into the left arm since last night. History of coronary artery disease with stent placement 2 days ago. EXAM: CHEST - 2 VIEW COMPARISON:  Chest x-ray of October 01, 2010 FINDINGS: The lungs are well-expanded. There is no focal infiltrate. There is no pleural effusion or pneumothorax. The heart and pulmonary vascularity are normal. The mediastinum is normal in width. There is faint calcification in the wall of the aortic arch. The bony thorax exhibits no acute abnormality. IMPRESSION: There is no CHF, pneumonia, nor other acute cardiopulmonary abnormality. Electronically Signed   By: David  Martinique M.D.   On: 03/09/2018 09:39    Procedures Procedures (including critical care time)  Medications Ordered in ED Medications - No data to display   Initial  Impression /  Assessment and Plan / ED Course  I have reviewed the triage vital signs and the nursing notes.  Pertinent labs & imaging results that were available during my care of the patient were reviewed by me and considered in my medical decision making (see chart for details).   TICHINA KOEBEL is a 67 y.o. female with history of CAD, GERD, HLD, unstable angina, who presents with chest tightness and dizziness in the setting of recent heart cath and PCI. Patient found to have a normal EKG with positive troponin (0.11 and 0.10). Differential diagnosis includes myocardia ischemia related to her recent heart cath and PCI such as transient or persistent acute vessel closure (usually due to stent thrombosis or progression of an untreated dissection), transient coronary spasm, side branch occlusion, coronary dissection, or distal embolization of atherosclerotic or thrombotic debris or air. Patient was hypertensive throughout ED stay (140's-150's/60's) with otherwise unremarkable vital signs. CBC and BMP were unremarkable. CXR showed no acute cardiopulmonary abnormalities. Cardiology consulted and believes that patient is appropriate for admission and further workup of elevated troponin. Patient will be admitted to cardiology.     Final Clinical Impressions(s) / ED Diagnoses   Final diagnoses:  Chest pain, unspecified type  Elevated troponin    ED Discharge Orders    None       Carroll Sage, MD 03/09/18 1456    Little, Wenda Overland, MD 03/10/18 [phone]

## 2018-03-09 NOTE — Progress Notes (Signed)
ANTICOAGULATION CONSULT NOTE - Initial Consult  Pharmacy Consult for heparin Indication: chest pain/ACS  Allergies  Allergen Reactions  . Elavil [Amitriptyline Hcl] Rash  . Sulfur Rash    Patient Measurements: TBW 68.6 kg Heparin Dosing Weight: 64 kg  Vital Signs: Temp: 98 F (36.7 C) (07/25 0911) Temp Source: Oral (07/25 0911) BP: 145/66 (07/25 1230) Pulse Rate: 65 (07/25 1230)  Labs: Recent Labs    03/08/18 0427 03/09/18 1000  HGB 11.9* 12.9  HCT 36.5 39.5  PLT 215 210  CREATININE 0.77 0.75    Estimated Creatinine Clearance: 62.8 mL/min (by C-G formula based on SCr of 0.75 mg/dL).   Medical History: Past Medical History:  Diagnosis Date  . Allergy    "dust mite residue, mold, mildew, grapefruit, chili peppers, cat dander, some weeds" (03/07/2018)  . Anxiety   . Arthritis    "minor; right knee, left hip" (03/07/2018)  . Bruises easily   . Cancer of left breast ([place])    double mastectomy 01/26/2011  . Childhood asthma   . Contact lens/glasses fitting   . Coronary artery disease   . Depression   . GERD (gastroesophageal reflux disease)    "at times" (03/07/2018)  . Heart murmur   . Hiatal hernia   . High cholesterol   . History of kidney stones    "lots; no OR" (03/07/2018)  . Osteoporosis   . Rectal bleeding    hemorroid  . Ringing in ears, bilateral    "since ~ 06/2017" (03/07/2018)  . Sinus drainage    Assessment: 67 yo F presents on 7/25 with chest palpitations. Pharmacy consulted to start heparin. No anticoag PTA but is on aspirin and Brilinta. CBC stable. Troponin 0.11.  Goal of Therapy:  Heparin level 0.3-0.7 units/ml Monitor platelets by anticoagulation protocol: Yes   Plan:  Give heparin 3,000 unit bolus Start heparin gtt at 750 units/hr Monitor daily heparin level, CBC, s/s of bleed  Kashia Brossard J 03/09/2018,3:12 PM

## 2018-03-09 NOTE — ED Notes (Signed)
MD in to assess 

## 2018-03-10 ENCOUNTER — Encounter (HOSPITAL_COMMUNITY): Payer: Self-pay | Admitting: Physician Assistant

## 2018-03-10 ENCOUNTER — Telehealth: Payer: Self-pay | Admitting: Physician Assistant

## 2018-03-10 DIAGNOSIS — I208 Other forms of angina pectoris: Secondary | ICD-10-CM | POA: Diagnosis not present

## 2018-03-10 DIAGNOSIS — R002 Palpitations: Secondary | ICD-10-CM

## 2018-03-10 DIAGNOSIS — R0789 Other chest pain: Secondary | ICD-10-CM

## 2018-03-10 DIAGNOSIS — R030 Elevated blood-pressure reading, without diagnosis of hypertension: Secondary | ICD-10-CM

## 2018-03-10 DIAGNOSIS — I10 Essential (primary) hypertension: Secondary | ICD-10-CM | POA: Diagnosis not present

## 2018-03-10 DIAGNOSIS — I2510 Atherosclerotic heart disease of native coronary artery without angina pectoris: Secondary | ICD-10-CM

## 2018-03-10 LAB — BASIC METABOLIC PANEL
Anion gap: 11 (ref 5–15)
BUN: 14 mg/dL (ref 8–23)
CALCIUM: 9.6 mg/dL (ref 8.9–10.3)
CO2: 26 mmol/L (ref 22–32)
CREATININE: 0.87 mg/dL (ref 0.44–1.00)
Chloride: 104 mmol/L (ref 98–111)
GFR calc Af Amer: >60 mL/min (ref >60)
Glucose, Bld: 94 mg/dL (ref 70–99)
POTASSIUM: 4.1 mmol/L (ref 3.5–5.1)
SODIUM: 141 mmol/L (ref 135–145)

## 2018-03-10 LAB — CBC
HCT: 40.3 % (ref 36.0–46.0)
HEMOGLOBIN: 13.3 g/dL (ref 12.0–15.0)
MCH: 31.6 pg (ref 26.0–34.0)
MCHC: 33 g/dL (ref 30.0–36.0)
MCV: 95.7 fL (ref 78.0–100.0)
Platelets: 239 10*3/uL (ref 150–400)
RBC: 4.21 MIL/uL (ref 3.87–5.11)
RDW: 12.3 % (ref 11.5–15.5)
WBC: 7.4 10*3/uL (ref 4.0–10.5)

## 2018-03-10 LAB — HIV ANTIBODY (ROUTINE TESTING W REFLEX): HIV SCREEN 4TH GENERATION: NONREACTIVE

## 2018-03-10 LAB — HEPARIN LEVEL (UNFRACTIONATED): HEPARIN UNFRACTIONATED: 0.27 [IU]/mL — AB (ref 0.30–0.70)

## 2018-03-10 LAB — TROPONIN I
TROPONIN I: 0.09 ng/mL — AB (ref <0.03)
TROPONIN I: 0.07 ng/mL — AB (ref <0.03)

## 2018-03-10 MED ORDER — METOPROLOL SUCCINATE ER 25 MG PO TB24: 12.5000 mg | ORAL_TABLET | Freq: Every day | ORAL | 6 refills | Status: DC

## 2018-03-10 MED ORDER — AMLODIPINE BESYLATE 5 MG PO TABS: 5.0000 mg | ORAL_TABLET | Freq: Every day | ORAL | 6 refills | Status: DC

## 2018-03-10 NOTE — Progress Notes (Signed)
Progress Note  Patient Name: [name] Date of Encounter: 03/10/2018  Primary Cardiologist: Shelva Majestic, MD   Subjective   No chest pain, jaw pain or shortness of breath this morning her troponins remain low and flat on IV heparin.  Inpatient Medications    Scheduled Meds: . amLODipine  5 mg Oral Daily  . aspirin EC  81 mg Oral Daily  . atorvastatin  80 mg Oral q1800  . FLUoxetine  20 mg Oral Daily  . metoprolol tartrate  12.5 mg Oral Daily  . ticagrelor  90 mg Oral BID   Continuous Infusions: . heparin 850 Units/hr (03/10/18 0800)   PRN Meds: acetaminophen, clonazePAM, nitroGLYCERIN, ondansetron (ZOFRAN) IV   Vital Signs    Vitals:   03/09/18 2347 03/10/18 0325 03/10/18 0459 03/10/18 0700  BP: (!) 108/46 (!) 117/53  (!) 104/56  Pulse: (!) 58 67  61  Resp: 15 15  16   Temp: 98.2 F (36.8 C) 98.1 F (36.7 C)  98.2 F (36.8 C)  TempSrc: Oral Oral  Oral  SpO2: 98% 98%  96%  Weight:   145 lb 15.1 oz (66.2 kg)     Intake/Output Summary (Last 24 hours) at 03/10/2018 0843 Last data filed at 03/10/2018 0800 Gross per 24 hour  Intake 148.65 ml  Output -  Net 148.65 ml   Filed Weights   03/10/18 0459  Weight: 145 lb 15.1 oz (66.2 kg)    Telemetry    Normal sinus rhythm- Personally Reviewed  ECG    Normal sinus rhythm at 63 without ST or T wave changes.- Personally Reviewed  Physical Exam   GEN: No acute distress.   Neck: No JVD Cardiac: RRR, no murmurs, rubs, or gallops.  Respiratory: Clear to auscultation bilaterally. GI: Soft, nontender, non-distended  MS: No edema; No deformity. Neuro:  Nonfocal  Psych: Normal affect   Labs    Chemistry Recent Labs  Lab 03/08/18 0427 03/09/18 1000 03/10/18 0232  NA 142 140 141  K 4.2 4.2 4.1  CL 113* 107 104  CO2 22 23 26   GLUCOSE 106* 99 94  BUN 12 8 14   CREATININE 0.77 0.75 0.87  CALCIUM 8.5* 9.3 9.6  GFRNONAA >60 >60 >60  GFRAA >60 >60 >60  ANIONGAP 7 10 11      Hematology Recent Labs    Lab 03/08/18 0427 03/09/18 1000 03/10/18 0232  WBC 7.0 7.1 7.4  RBC 3.77* 4.10 4.21  HGB 11.9* 12.9 13.3  HCT 36.5 39.5 40.3  MCV 96.8 96.3 95.7  MCH 31.6 31.5 31.6  MCHC 32.6 32.7 33.0  RDW 12.6 12.4 12.3  PLT 215 210 239    Cardiac Enzymes Recent Labs  Lab 03/09/18 1859 03/10/18 0232  TROPONINI 0.10* 0.09*    Recent Labs  Lab 03/09/18 1008 03/09/18 1326  TROPIPOC 0.10* 0.11*     BNPNo results for input(s): BNP, PROBNP in the last 168 hours.   DDimer No results for input(s): DDIMER in the last 168 hours.   Radiology    Dg Chest 2 View  Result Date: 03/09/2018 CLINICAL DATA:  Tight sensation in the left jaw all and face with pulling in the mid back and extending into the left arm since last night. History of coronary artery disease with stent placement 2 days ago. EXAM: CHEST - 2 VIEW COMPARISON:  Chest x-ray of October 01, 2010 FINDINGS: The lungs are well-expanded. There is no focal infiltrate. There is no pleural effusion or pneumothorax. The heart and  pulmonary vascularity are normal. The mediastinum is normal in width. There is faint calcification in the wall of the aortic arch. The bony thorax exhibits no acute abnormality. IMPRESSION: There is no CHF, pneumonia, nor other acute cardiopulmonary abnormality. Electronically Signed   By: David  Martinique M.D.   On: 03/09/2018 09:39    Cardiac Studies   None performed  Patient Profile     67 y.o. female status post mid LAD PCI and drug-eluting stenting on 03/07/2018.  She was discharged home Wednesday morning and came back yesterday with recurrent symptoms of jaw pain and some palpitations.  Her enzymes have been low and flat.  She is asymptomatic today.  I reviewed her cardiac cath films and there were no complications noted.  I am going to discontinue the heparin.  I think she can be discharged home later today.  Assessment & Plan    1: Coronary artery disease- postop day #2 mid LAD PCI and drug-eluting stenting  with an excellent result.  She was readmitted a day after discharge with recurrent symptoms.  Upon further reflection she thinks her symptoms may be related to her back and/or anxiety.  Her troponins are low and flat.  I am going to stop her heparin this morning and ambulate her.  The plan is to discharge home later this morning if she remains asymptomatic.  2: Essential hypertension- blood pressure much better on low-dose amlodipine.  For questions or updates, please contact [place] Please consult [email or web address] for contact info under Cardiology/STEMI.      Signed, Quay Burow, MD  03/10/2018, 8:43 AM

## 2018-03-10 NOTE — Telephone Encounter (Signed)
Please call patient. She had been admitted 7/23 and discharged 7/24, but returned yesterday for repeat admission for chest pain. I discharged her this morning.  I was wrapping up my discharge summary today after she was discharged and noticed on her cardiac CT from earlier this month (02/27/18) with Dr. Claiborne Billings it showed a nonspecific small pulmonary nodule. I do not see where Dr. Claiborne Billings or inpatient team commented on follow-up in the last few weeks so I just wanted to make sure patient is made aware of the finding - statistically likely benign, but she should review with her PCP to discuss whether they want to pursue a CT in 12 months to repeat.  Thank you! Dayna Dunn PA-C  02/27/18 Cardiac CT OVERREAD IMPRESSION: 1. 2 mm left upper lobe nodule, nonspecific but statistically likely benign. No follow-up needed if patient is low-risk. Non-contrast chest CT can be considered in 12 months if patient is high-risk. This recommendation follows the consensus statement: Guidelines for Management of Incidental Pulmonary Nodules Detected on CT Images: From the Fleischner Society 2017; Radiology 2017; 284:228-243. 2.  Aortic Atherosclerosis (ICD10-I70.0).

## 2018-03-10 NOTE — Progress Notes (Signed)
Patient being discharged.  Patient stated she did not want a walker, she wanted to walk out on her own.  Accompanied by husband.

## 2018-03-10 NOTE — Discharge Summary (Addendum)
Discharge Summary    Patient ID: [name],  MRN: [id number], DOB/AGE: [birth year] 4 y.o.  Admit date: 03/09/2018 Discharge date: 03/10/2018  Primary Care Provider: [name] R Primary Cardiologist: Dr. Claiborne Billings  Discharge Diagnoses    Principal Problem:   Atypical chest pain Active Problems:   Anxiety state   Hyperlipidemia   CAD in native artery   Elevated blood pressure reading   Palpitations   Diagnostic Studies/Procedures    N/A ________     History of Present Illness     [name] is a 67 y.o. female with history of CAD s/p PCI/DES to LAD 03/07/18, breast CA (double mastectomy with subsequent reconstruction), HTN, HLD who presented back the hospital with multiple complaints including palpitations, high blood pressure, and vague chest discomfort.   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 She recently saw cardiology as an outpatient for chest pain and jaw pain. Cardiac CT was abnormal, so outpatient cath was recommended. She underwent LHC 7/23 which revealed 95% stenosis of proximal LAD, managed with PCI/DES. There was no other significant disease. She was recommended for DAPT with aspirin and brilinta for a minimum of 1 year. She was transitioned to high-intensity statin and recommended to continue her home BBlocker.   She presented back to the hospital yesterday with numerous symptoms. Overnight she had called the cardiologist on call reporting right wrist swelling at the cath site. ED eval was advised but she ultimately decided that she was overreacting and did not seek further medical attention for this concern. However, she then awoke later in the night with around 2am with sudden onset palpitations with associated SOB which lasted ~30 seconds and resolved spontaneously. She did not check her pulse at that time. She was able  to fall asleep again but woke around 6am with recurrent symptoms. She reported taking her blood pressure at home with an SBP in the 190s. Given BP elevation and vague symptoms that morning, she walked into Dr. Evette Georges office and was advised to go to the ER.  She began feeling poorly in the waiting room, reporting some mild chest discomfort and decided to present to the ED instead. ED course: Hypertensive, otherwise VSS. Labs notable for electrolytes wnl, Cr 0.75, CBC wnl, trop 0.10>0.11. EKG with NSR, no STE/D, no TWI. CXR is without acute findings. Cardiology asked to evaluate.   1. CP in patient with CAD and recent intervention: she was admitted overnight for evaluation and started on heparin drip as precaution. Amlodipine was started for possible vasospasm and to assist with BP. Her enzymes remained low and flat (0.10-0.11 point of care, then regular values 0.10-0.09-0.07). Upon further reflection the patient felt her symptoms may be related to her back and/or anxiety. Heparin was stopped and the patient ambulated without complications. She feels much better today.  2. Elevated blood pressure: prior to last admission patient reported low/normal BP's. They have been persistently elevated in hospital - possible there is an anxiety component. Her pressures were much better on amlodipine.  3. HLD: LDL near goal at 72 on last admission. Continue statin.  4. Palpitations: reported a couple episodes of her heart racing overnight. No evidence of arrhythmia on telemetry last admission and no prior history. Continue metoprolol. Of note, due to bradycardia, prior discharging APP had recommended she only take this once daily. I spoke with the patient  about adjustment of her medication. In light of continuing the amlodipine, we will switch her metoprolol from Lopressor to Toprol to allow for continued once daily low dosing. If palpitations recurs, would consider event monitoring.  Dr. Gwenlyn Found has seen and examined  the patient today and feels she is stable for discharge.  She has f/u planned 03/21/18 with Almyra Deforest. As previously stated, she may return to work 03/20/18.  Discharge Vitals Blood pressure (!) 122/52, pulse 66, temperature 97.9 F (36.6 C), temperature source Oral, resp. rate 16, weight 145 lb 15.1 oz (66.2 kg), last menstrual period 09/10/2006, SpO2 100 %.  Filed Weights   03/10/18 0459  Weight: 145 lb 15.1 oz (66.2 kg)    Labs & Radiologic Studies    CBC Recent Labs    03/09/18 1000 03/10/18 0232  WBC 7.1 7.4  HGB 12.9 13.3  HCT 39.5 40.3  MCV 96.3 95.7  PLT 210 932   Basic Metabolic Panel Recent Labs    03/09/18 1000 03/10/18 0232  NA 140 141  K 4.2 4.1  CL 107 104  CO2 23 26  GLUCOSE 99 94  BUN 8 14  CREATININE 0.75 0.87  CALCIUM 9.3 9.6   Cardiac Enzymes Recent Labs    03/09/18 1859 03/10/18 0232 03/10/18 0806  TROPONINI 0.10* 0.09* 0.07*   _____________  Dg Chest 2 View  Result Date: 03/09/2018 CLINICAL DATA:  Tight sensation in the left jaw all and face with pulling in the mid back and extending into the left arm since last night. History of coronary artery disease with stent placement 2 days ago. EXAM: CHEST - 2 VIEW COMPARISON:  Chest x-ray of October 01, 2010 FINDINGS: The lungs are well-expanded. There is no focal infiltrate. There is no pleural effusion or pneumothorax. The heart and pulmonary vascularity are normal. The mediastinum is normal in width. There is faint calcification in the wall of the aortic arch. The bony thorax exhibits no acute abnormality. IMPRESSION: There is no CHF, pneumonia, nor other acute cardiopulmonary abnormality. Electronically Signed   By: David  Martinique M.D.   On: 03/09/2018 09:39   Ct Coronary Morph W/cta Cor W/score W/ca W/cm &/or Wo/cm  Addendum Date: 02/27/2018   ADDENDUM REPORT: 02/27/2018 12:05 CLINICAL DATA:  Chest pain EXAM: Cardiac CTA MEDICATIONS: Sub lingual nitro. 4mg  and lopressor 5mg  TECHNIQUE: The patient was  scanned on a Siemens 355 slice scanner. Gantry rotation speed was 270 msecs. Collimation was .57mm. A 100 kV prospective scan was triggered in the descending thoracic aorta at 111 HU's with 5% padding centered around 78% of the R-R interval. Average HR during the scan was 52 bpm. The 3D data set was interpreted on a dedicated work station using MPR, MIP and VRT modes. A total of 80cc of contrast was used. FINDINGS: Non-cardiac: See separate report from [facility]. No significant findings on limited lung and soft tissue windows. Calcium Score: Mild calcium noted in LM and mid LAD Coronary Arteries: Right dominant with no anomalies LM: Less than 20% calcific stenosis LAD: 50-75% mid LAD mixed plaque D1: Normal D2: Normal Circumflex: Normal OM1: Normal OM2: Normla RCA: Normal PDA: Normal PLA:  Normal IMPRESSION: 1.  Aortic root normal 3.3 cm 2.  Calcium score 6 which is 22 th percentile for age and sex 8.  50-75% mid LAD stenosis study will be sent for FFR CT [place] Electronically Signed   By: Jenkins Rouge M.D.   On: 02/27/2018 12:05   Result Date: 02/27/2018 EXAM:  OVER-READ INTERPRETATION  CT CHEST The following report is an over-read performed by radiologist Dr. Vinnie Langton of [facility], [place] on 02/27/2018. This over-read does not include interpretation of cardiac or coronary anatomy or pathology. The coronary calcium score/coronary CTA interpretation by the cardiologist is attached. COMPARISON:  None. FINDINGS: Aortic atherosclerosis. 2 mm nodule in the periphery of the left upper lobe (axial image 11 of series 11). Within the visualized portions of the thorax there are no other larger suspicious appearing pulmonary nodules or masses, there is no acute consolidative airspace disease, no pleural effusions, no pneumothorax and no lymphadenopathy. Visualized portions of the upper abdomen are unremarkable. There are no aggressive appearing lytic or blastic lesions noted in the visualized  portions of the skeleton. Bilateral breast implants are incidentally noted. IMPRESSION: 1. 2 mm left upper lobe nodule, nonspecific but statistically likely benign. No follow-up needed if patient is low-risk. Non-contrast chest CT can be considered in 12 months if patient is high-risk. This recommendation follows the consensus statement: Guidelines for Management of Incidental Pulmonary Nodules Detected on CT Images: From the Fleischner Society 2017; Radiology 2017; 284:228-243. 2.  Aortic Atherosclerosis (ICD10-I70.0). Electronically Signed: By: Vinnie Langton M.D. On: 02/27/2018 11:37   Ct Coronary Fractional Flow Reserve Data Prep  Result Date: 02/27/2018 CLINICAL DATA:  CAD EXAM: FFR-CT TECHNIQUE: The best systolic and diastolic phases of the patients gated cardiac CTA were sent to [facility] for fluid hemodynamic analysis using both normal and sharp kernels CONTRAST:  None COMPARISON:  None FINDINGS: FFR CT was normal in RCA and circumflex FFR CT was markedly abnormal in the mid LAD at. 5 IMPRESSION: Hemodynamically significant mid LAD stenosis with FFR CT .50 Patient will be referred for heart cath with Dr Claiborne Billings Electronically Signed   By: Jenkins Rouge M.D.   On: 02/27/2018 15:26   Disposition   Pt is being discharged home today in good condition.  Follow-up Plans & Appointments   Follow-up Information    Almyra Deforest, [place] Follow up.   Specialties:  Cardiology, Radiology Why:  Keep follow-up as previously arranged below. Isaac Laud is one of the PAs with our cardiology team. Contact information: [address] [place] [address] [phone]          Discharge Instructions    Diet - low sodium heart healthy   Complete by:  As directed    Increase activity slowly   Complete by:  As directed    No lifting over 5 lbs for 1 week. No sexual activity for 1 week. Keep procedure site clean & dry. If you notice increased pain, swelling, bleeding or pus, call/return!  You may  shower, but no soaking baths/hot tubs/pools for 1 week.      Discharge Medications   Allergies as of 03/10/2018      Reactions   Elavil [amitriptyline Hcl] Rash   Sulfur Rash      Medication List    STOP taking these medications   metoprolol tartrate 25 MG tablet Commonly known as:  LOPRESSOR     TAKE these medications   amLODipine 5 MG tablet Commonly known as:  NORVASC Take 1 tablet (5 mg total) by mouth daily. Start taking on:  03/11/2018   aspirin EC 81 MG tablet Take 1 tablet (81 mg total) by mouth daily.   atorvastatin 80 MG tablet Commonly known as:  LIPITOR Take 1 tablet (80 mg total) by mouth daily at 6 PM.   calcium-vitamin D 500-200 MG-UNIT tablet Commonly  known as:  OSCAL WITH D Take 1 tablet by mouth daily.   cetirizine 10 MG tablet Commonly known as:  ZYRTEC Take 10 mg by mouth daily as needed for allergies.   clonazePAM 0.5 MG tablet Commonly known as:  KLONOPIN Take 1 tablet (0.5 mg total) by mouth 2 (two) times daily as needed. What changed:    when to take this  reasons to take this   FLUoxetine 20 MG tablet Commonly known as:  PROZAC Take 1 tablet (20 mg total) by mouth daily.   fluticasone 50 MCG/ACT nasal spray Commonly known as:  FLONASE Place 2 sprays into both nostrils daily. What changed:    when to take this  reasons to take this   meclizine 25 MG tablet Commonly known as:  ANTIVERT Take 1 tablet (25 mg total) by mouth 3 (three) times daily as needed for dizziness.   metoprolol succinate 25 MG 24 hr tablet Commonly known as:  TOPROL XL Take 0.5 tablets (12.5 mg total) by mouth daily.   nitroGLYCERIN 0.4 MG SL tablet Commonly known as:  NITROSTAT Place 1 tablet (0.4 mg total) under the tongue every 5 (five) minutes as needed for chest pain.   ticagrelor 90 MG Tabs tablet Commonly known as:  BRILINTA Take 1 tablet (90 mg total) by mouth 2 (two) times daily.   vitamin C 100 MG tablet Take 100 mg by mouth daily as  needed.        Allergies:  Allergies  Allergen Reactions  . Elavil [Amitriptyline Hcl] Rash  . Sulfur Rash     Outstanding Labs/Studies   n/a  Duration of Discharge Encounter   Greater than 30 minutes including physician time.  Signed, Charlie Pitter PA-C 03/10/2018, 11:38 AM  Agree with findings by Melina Copa PA-C  [name] had no further chest pain.  Her troponins were low and flat.  She ambulated off of IV heparin.  Her exam was benign this morning.  She is stable for discharge and will follow-up with Dr. Claiborne Billings as an outpatient.  Lorretta Harp, M.D., [place], [facility], [place]ver[place] [address] [place], [place]  [address]  [phone] 03/10/2018 12:18 PM

## 2018-03-14 ENCOUNTER — Telehealth (HOSPITAL_COMMUNITY): Payer: Self-pay

## 2018-03-14 NOTE — Telephone Encounter (Signed)
Patients insurance is active and benefits verified through [facility] - No co-pay, deductible amount of $1,000/$1,000 has been met, out of pocket amount of $5,000/$3,640.78 has been met, 20% co-insurance, and no pre-authorization is required. Reference # [name] 03/14/18.

## 2018-03-14 NOTE — Telephone Encounter (Signed)
Attempted to contact patient in regards to Cardiac Rehab - lm on vm °

## 2018-03-14 NOTE — Telephone Encounter (Signed)
I will review with the patient CT finding on followup.

## 2018-03-15 ENCOUNTER — Telehealth: Payer: Self-pay | Admitting: Cardiology

## 2018-03-15 NOTE — Telephone Encounter (Signed)
Received page from patient.  She states that she has had a nosebleed for approximately the last hour and cannot stop it despite manual pressure.  The patient does note that the bleeding seems to be improving to some degree.  She had a recent drug-eluting stent to the mid LAD within the last 10 days.  She is on aspirin and Brilinta for this.  I advised her to to continue with manual pressure and to apply nasal Afrin.  I told her that if these conservative measures do not stop the bleeding within the next hour or so, to present to the emergency department for further evaluation.  She may need a [employer] with ENT if the bleed is brisk and significant.  The patient expressed understanding and is in agreement with this plan.  Doylene Canning, MD

## 2018-03-17 ENCOUNTER — Telehealth (HOSPITAL_COMMUNITY): Payer: Self-pay

## 2018-03-17 NOTE — Telephone Encounter (Signed)
Patient returned phone call and stated she is interested in the program. Patient will have to attend classes on Mondays & Wednesdays as she does work full time at a retirement home. Explained scheduling process with patient and went over insurance, patient verbalized understanding. Will contact patient for scheduling once follow up appt has been completed.

## 2018-03-21 ENCOUNTER — Ambulatory Visit (INDEPENDENT_AMBULATORY_CARE_PROVIDER_SITE_OTHER): Payer: PRIVATE HEALTH INSURANCE | Admitting: Physician Assistant

## 2018-03-21 ENCOUNTER — Encounter: Payer: Self-pay | Admitting: Physician Assistant

## 2018-03-21 VITALS — BP 120/62 | HR 54 | Ht 62.0 in | Wt 146.0 lb

## 2018-03-21 DIAGNOSIS — R911 Solitary pulmonary nodule: Secondary | ICD-10-CM

## 2018-03-21 DIAGNOSIS — E785 Hyperlipidemia, unspecified: Secondary | ICD-10-CM | POA: Diagnosis not present

## 2018-03-21 DIAGNOSIS — I10 Essential (primary) hypertension: Secondary | ICD-10-CM | POA: Diagnosis not present

## 2018-03-21 DIAGNOSIS — I25110 Atherosclerotic heart disease of native coronary artery with unstable angina pectoris: Secondary | ICD-10-CM

## 2018-03-21 NOTE — Patient Instructions (Signed)
Medication Instructions: Your physician recommends that you continue on your current medications as directed. Please refer to the Current Medication list given to you today.  Labwork: Your physician recommends that you return for a FASTING lipid profile and hepatic function panel in 2-3 months (November-December).   Follow-Up: Your physician recommends that you schedule a follow-up appointment in: 3-4 months with Dr. Claiborne Billings.  If you need a refill on your cardiac medications before your next appointment, please call your pharmacy.

## 2018-03-21 NOTE — Telephone Encounter (Signed)
Discussed with patient and informed her of the nodule on CT and recommended her to discuss further monitoring with PCP

## 2018-03-21 NOTE — Progress Notes (Addendum)
Cardiology Office Note    Date:  03/21/2018   ID:  [name], DOB 11-06-1950, MRN [id number]  PCP:  [name], MD  Cardiologist:  Dr. Claiborne Billings   Chief Complaint  Patient presents with  . Hospitalization Follow-up    post PCI of LAD, seen for Dr. Claiborne Billings    History of Present Illness:  [name] is a 67 y.o. female with PMH of CAD, heart murmur and hyperlipidemia.  She was self referred to Dr. Claiborne Billings for evaluation of jaw discomfort.  Coronary CT obtained in May 2019 demonstrated a calcium score of 6, mild calcium noted in the left main and mid LAD, LAD territory had 50 to 75% mid LAD stenosis with mixed plaque with positive FFR of 0.5.  She had normal left circumflex artery RCA.  CT also showed nonspecific 2 mm left upper lobe nodule which was felt most likely to be benign.  Echocardiogram obtained on 01/02/2018 showed EF 55 to 60%, grade 2 DD, mild AI.  She eventually underwent a scheduled outpatient cardiac catheterization on 03/07/2018 which showed 95% proximal LAD lesion this was treated with 2.5 x 18 mm resolute Onyx DES.  Postprocedure, she was started on aspirin, Brilinta, high-dose statin and beta-blocker.  She was readmitted on July 25 with recurrent chest pain, palpitation, and high blood pressure.  Her chest pain was eventually felt to be related to her back and anxiety.  Overnight telemetry also did not reveal any arrhythmia despite complaint of palpitation.  Her previous Lopressor was switched to Toprol-XL instead.  Patient presents today for cardiology office visit.  She has been doing well since the recent procedure and has been compliant with aspirin and Brilinta.  She does occasionally notice inability to take a deep breath.  Although this may be related to side effect associated with Brilinta, however her symptom is very mild, I would be in favor of continuing Brilinta at this time.  Her recent lipid panel showed significant improvement in the LDL and total cholesterol  however decreased HDL.  She will need 2 to 10-month fasting lipid panel and LFT again.  She denies any recent chest discomfort.  Otherwise she can start on the cardiac rehab at this point.  The ecchymosis that was seen at the right radial cath after the procedure is not resolved.  She has 2+ radial pulse and denies any numbness and tingling sensation in the fingertip.   Past Medical History:  Diagnosis Date  . Allergy    "dust mite residue, mold, mildew, grapefruit, chili peppers, cat dander, some weeds" (03/07/2018)  . Anxiety   . Arthritis    "minor; right knee, left hip" (03/07/2018)  . Bruises easily   . Cancer of left breast ([place])    double mastectomy 01/26/2011  . Childhood asthma   . Contact lens/glasses fitting   . Coronary artery disease    a.  She underwent LHC 7/23 which revealed 95% stenosis of proximal LAD, managed with PCI/DES.   . Depression   . Essential hypertension   . GERD (gastroesophageal reflux disease)    "at times" (03/07/2018)  . Heart murmur   . Hiatal hernia   . High cholesterol   . History of kidney stones    "lots; no OR" (03/07/2018)  . Osteoporosis   . Rectal bleeding    hemorroid  . Ringing in ears, bilateral    "since ~ 06/2017" (03/07/2018)  . Sinus drainage     Past Surgical History:  Procedure Laterality Date  . APPENDECTOMY    . BREAST LUMPECTOMY W/ NEEDLE LOCALIZATION Left 10/05/2010   lumpectomy  . BREAST SURGERY    . CORONARY STENT INTERVENTION N/A 03/07/2018   Procedure: CORONARY STENT INTERVENTION;  Surgeon: Troy Sine, MD;  Location: [facility];  Service: Cardiovascular;  Laterality: N/A;  . LEFT HEART CATH AND CORONARY ANGIOGRAPHY N/A 03/07/2018   Procedure: LEFT HEART CATH AND CORONARY ANGIOGRAPHY;  Surgeon: Troy Sine, MD;  Location: [facility];  Service: Cardiovascular;  Laterality: N/A;  . MASTECTOMY Right 01/26/2011  . MASTECTOMY COMPLETE / SIMPLE W/ SENTINEL NODE BIOPSY Left 01/26/2011  . OVARIAN CYST  SURGERY  1997, 2001, 2008    Current Medications: Outpatient Medications Prior to Visit  Medication Sig Dispense Refill  . amLODipine (NORVASC) 5 MG tablet Take 1 tablet (5 mg total) by mouth daily. 30 tablet 6  . aspirin EC 81 MG tablet Take 1 tablet (81 mg total) by mouth daily. 90 tablet 3  . atorvastatin (LIPITOR) 80 MG tablet Take 1 tablet (80 mg total) by mouth daily at 6 PM. 90 tablet 3  . cetirizine (ZYRTEC) 10 MG tablet Take 10 mg by mouth daily as needed for allergies.     . clonazePAM (KLONOPIN) 0.5 MG tablet Take 1 tablet (0.5 mg total) by mouth 2 (two) times daily as needed. (Patient taking differently: Take 0.5 mg by mouth daily as needed for anxiety. ) 30 tablet 1  . FLUoxetine (PROZAC) 20 MG tablet Take 1 tablet (20 mg total) by mouth daily. 90 tablet 2  . fluticasone (FLONASE) 50 MCG/ACT nasal spray Place 2 sprays into both nostrils daily. 16 g 6  . metoprolol succinate (TOPROL XL) 25 MG 24 hr tablet Take 0.5 tablets (12.5 mg total) by mouth daily. 30 tablet 6  . ticagrelor (BRILINTA) 90 MG TABS tablet Take 1 tablet (90 mg total) by mouth 2 (two) times daily. 180 tablet 3  . Ascorbic Acid (VITAMIN C) 100 MG tablet Take 100 mg by mouth daily as needed.    . calcium-vitamin D (OSCAL WITH D) 500-200 MG-UNIT per tablet Take 1 tablet by mouth daily.    . meclizine (ANTIVERT) 25 MG tablet Take 1 tablet (25 mg total) by mouth 3 (three) times daily as needed for dizziness. (Patient not taking: Reported on 03/09/2018) 30 tablet 0  . nitroGLYCERIN (NITROSTAT) 0.4 MG SL tablet Place 1 tablet (0.4 mg total) under the tongue every 5 (five) minutes as needed for chest pain. (Patient not taking: Reported on 03/21/2018) 25 tablet 3   No facility-administered medications prior to visit.      Allergies:   Elavil [amitriptyline hcl] and Sulfur   Social History   Socioeconomic History  . Marital status: Divorced    Spouse name: Not on file  . Number of children: Not on file  . Years of  education: Not on file  . Highest education level: Not on file  Occupational History  . Not on file  Social Needs  . Financial resource strain: Not on file  . Food insecurity:    Worry: Not on file    Inability: Not on file  . Transportation needs:    Medical: Not on file    Non-medical: Not on file  Tobacco Use  . Smoking status: Never Smoker  . Smokeless tobacco: Never Used  Substance and Sexual Activity  . Alcohol use: Yes    Comment: 03/07/2018 "probably once/month"  . Drug use:  No  . Sexual activity: Not on file  Lifestyle  . Physical activity:    Days per week: Not on file    Minutes per session: Not on file  . Stress: Not on file  Relationships  . Social connections:    Talks on phone: Not on file    Gets together: Not on file    Attends religious service: Not on file    Active member of club or organization: Not on file    Attends meetings of clubs or organizations: Not on file    Relationship status: Not on file  Other Topics Concern  . Not on file  Social History Narrative  . Not on file     Family History:  The patient's family history includes Cancer in her brother and sister; Diabetes in her sister; Heart Problems in her mother; Heart failure in her paternal grandfather and paternal grandmother; Hyperlipidemia in her mother; Hypertension in her father and mother.   ROS:   Please see the history of present illness.    ROS All other systems reviewed and are negative.   PHYSICAL EXAM:   VS:  BP 120/62   Pulse (!) 54   Ht 5\' 2"  (1.575 m)   Wt 146 lb (66.2 kg)   LMP 09/10/2006   BMI 26.70 kg/m    GEN: Well nourished, well developed, in no acute distress  HEENT: normal  Neck: no JVD, carotid bruits, or masses Cardiac: RRR; no murmurs, rubs, or gallops,no edema  Respiratory:  clear to auscultation bilaterally, normal work of breathing GI: soft, nontender, nondistended, + BS MS: no deformity or atrophy  Skin: warm and dry, no rash Neuro:  Alert and  Oriented x 3, Strength and sensation are intact Psych: euthymic mood, full affect  Wt Readings from Last 3 Encounters:  03/21/18 146 lb (66.2 kg)  03/10/18 145 lb 15.1 oz (66.2 kg)  03/08/18 151 lb 3.8 oz (68.6 kg)      Studies/Labs Reviewed:   EKG:  EKG is ordered today.  The ekg ordered today demonstrates sinus bradycardia, heart rate 54, no significant ST-T wave changes.  Recent Labs: 09/24/2017: ALT 16 03/10/2018: BUN 14; Creatinine, Ser 0.87; Hemoglobin 13.3; Platelets 239; Potassium 4.1; Sodium 141   Lipid Panel    Component Value Date/Time   CHOL 142 03/06/2018 0913   TRIG 153 (H) 03/06/2018 0913   HDL 39 (L) 03/06/2018 0913   CHOLHDL 3.6 03/06/2018 0913   CHOLHDL 3.9 01/17/2016 0937   VLDL 37 (H) 01/17/2016 0937   LDLCALC 72 03/06/2018 0913    Additional studies/ records that were reviewed today include:   Echo 01/02/2018 LV EF: 55% -   60% Study Conclusions  - Left ventricle: The cavity size was normal. Wall thickness was   normal. Systolic function was normal. The estimated ejection   fraction was in the range of 55% to 60%. Wall motion was normal;   there were no regional wall motion abnormalities. Features are   consistent with a pseudonormal left ventricular filling pattern,   with concomitant abnormal relaxation and increased filling   pressure (grade 2 diastolic dysfunction). - Aortic valve: Valve mobility was restricted. There was mild   regurgitation.  Impressions:  - Normal LV systolic function; moderate diastolic dysfunction;   sclerotic aortic valve with mild AI.    Coronary CT 02/27/2018 IMPRESSION: 1. 2 mm left upper lobe nodule, nonspecific but statistically likely benign. No follow-up needed if patient is low-risk. Non-contrast chest CT can  be considered in 12 months if patient is high-risk. This recommendation follows the consensus statement: Guidelines for Management of Incidental Pulmonary Nodules Detected on CT Images: From the  Fleischner Society 2017; Radiology 2017; 284:228-243. 2.  Aortic Atherosclerosis (ICD10-I70.0).  IMPRESSION: 1.  Aortic root normal 3.3 cm  2.  Calcium score 6 which is 89 th percentile for age and sex  35.  50-75% mid LAD stenosis study will be sent for FFR CT  IMPRESSION: Hemodynamically significant mid LAD stenosis with FFR CT .50     Cath 03/07/2018  Prox LAD lesion is 95% stenosed.  Post intervention, there is a 0% residual stenosis.  A stent was successfully placed.   Single-vessel coronary obstructive disease with a 95% fibrotic appearing mid LAD stenosis distal to a proximal septal and diagonal vessel.  Normal left circumflex and dominant RCA.  LVEDP 21 mm.  Successful PCI to the mid LAD utilizing Cutting Balloon and DES stenting with a 2.5 x 18 mm Resolute Onyx stent with the 95% stenosis being reduced to 0% evidence for brisk TIMI-3 flow with no evidence for dissection at the completion of the procedure.  RECOMMENDATION:  Recommend uninterrupted dual antiplatelet therapy with Aspirin 81mg  daily and Ticagrelor 90mg  twice daily for a minimum of 12 months (ACS - Class I recommendation).  The patient will continue with low-dose beta-blocker therapy.  Aggressive lipid-lowering therapy with high potency statin.     ASSESSMENT:    1. Coronary artery disease involving native coronary artery of native heart with unstable angina pectoris ([place])   2. Hyperlipidemia, unspecified hyperlipidemia type   3. Essential hypertension   4. Pulmonary nodule      PLAN:  In order of problems listed above:  1. CAD: Her symptom has resolved since stent placement.  Underwent DES to proximal LAD.  She has been compliant with aspirin and Brilinta.  Continue on high-dose statin.  2. Hypertension: She returned to the hospital recently with complaint of high blood pressure, this has resolved.  Based on home blood pressure diary, her systolic blood pressure mainly ranges in the 100-1  30s.  We will continue on the current regimen  3. Hyperlipidemia: LDL has significantly improved based on lab work from 2 weeks ago, now only borderline high, will repeat fasting lipid panel and LFT in 2 to 14-month for reassessment.  LDL goal less than 70 4.  5. Pulmonary nodule: 2 mm nodule seen in the left upper lobe of the chest on the recent coronary CT.  I have informed the result to the patient and recommended for her to follow-up with her primary care doctor.  According to radiologist recommendation, no further work-up is needed if patient is low risk, may consider 60-month repeat CT image if the patient is high risk.   Medication Adjustments/Labs and Tests Ordered: Current medicines are reviewed at length with the patient today.  Concerns regarding medicines are outlined above.  Medication changes, Labs and Tests ordered today are listed in the Patient Instructions below. Patient Instructions  Medication Instructions: Your physician recommends that you continue on your current medications as directed. Please refer to the Current Medication list given to you today.  Labwork: Your physician recommends that you return for a FASTING lipid profile and hepatic function panel in 2-3 months (November-December).   Follow-Up: Your physician recommends that you schedule a follow-up appointment in: 3-4 months with Dr. Claiborne Billings.  If you need a refill on your cardiac medications before your next appointment, please call your pharmacy.  Hilbert Corrigan, [place]  03/21/2018 6:41 PM    [facility] [address], [place], [place]  [address] Phone: [phone]; Fax: [phone]

## 2018-03-22 ENCOUNTER — Other Ambulatory Visit: Payer: Self-pay | Admitting: Family Medicine

## 2018-03-22 ENCOUNTER — Telehealth (HOSPITAL_COMMUNITY): Payer: Self-pay

## 2018-03-22 DIAGNOSIS — E785 Hyperlipidemia, unspecified: Secondary | ICD-10-CM

## 2018-03-22 NOTE — Telephone Encounter (Signed)
Attempted to contact patient to schedule Cardiac Rehab - lm on vm

## 2018-03-29 ENCOUNTER — Encounter: Payer: Self-pay | Admitting: Family Medicine

## 2018-04-03 ENCOUNTER — Ambulatory Visit: Payer: PRIVATE HEALTH INSURANCE | Admitting: Family Medicine

## 2018-04-03 ENCOUNTER — Other Ambulatory Visit: Payer: Self-pay

## 2018-04-03 ENCOUNTER — Encounter: Payer: Self-pay | Admitting: Family Medicine

## 2018-04-03 VITALS — BP 112/64 | HR 61 | Temp 97.9°F | Ht 62.0 in | Wt 148.6 lb

## 2018-04-03 DIAGNOSIS — Z23 Encounter for immunization: Secondary | ICD-10-CM

## 2018-04-03 DIAGNOSIS — R911 Solitary pulmonary nodule: Secondary | ICD-10-CM

## 2018-04-03 NOTE — Progress Notes (Signed)
Subjective:    Patient ID: [name], female    DOB: 1950-09-19, 67 y.o.   MRN: [id number]  HPI [name] is a 67 y.o. female Presents today for: Chief Complaint  Patient presents with  . CT results    found spot on lungs     Patient Active Problem List   Diagnosis Date Noted  . Atypical chest pain 03/10/2018  . CAD in native artery 03/10/2018  . Elevated blood pressure reading 03/10/2018  . Palpitations 03/10/2018  . Chest pain 03/09/2018  . Hyperlipidemia 03/08/2018  . Coronary artery disease involving native coronary artery of native heart with unstable angina pectoris ([place]) 03/07/2018  . Unstable angina ([place])   . Cough 01/02/2013  . Anxiety state 01/02/2013  . Breast cancer (DCIS), stage 0, Left, receptor +, dx 2012 09/08/2010   Past Medical History:  Diagnosis Date  . Allergy    "dust mite residue, mold, mildew, grapefruit, chili peppers, cat dander, some weeds" (03/07/2018)  . Anxiety   . Arthritis    "minor; right knee, left hip" (03/07/2018)  . Bruises easily   . Cancer of left breast ([place])    double mastectomy 01/26/2011  . Childhood asthma   . Contact lens/glasses fitting   . Coronary artery disease    a.  She underwent LHC 7/23 which revealed 95% stenosis of proximal LAD, managed with PCI/DES.   . Depression   . Essential hypertension   . GERD (gastroesophageal reflux disease)    "at times" (03/07/2018)  . Heart murmur   . Hiatal hernia   . High cholesterol   . History of kidney stones    "lots; no OR" (03/07/2018)  . Osteoporosis   . Rectal bleeding    hemorroid  . Ringing in ears, bilateral    "since ~ 06/2017" (03/07/2018)  . Sinus drainage    Past Surgical History:  Procedure Laterality Date  . APPENDECTOMY    . BREAST LUMPECTOMY W/ NEEDLE LOCALIZATION Left 10/05/2010   lumpectomy  . BREAST SURGERY    . CORONARY STENT INTERVENTION N/A 03/07/2018   Procedure: CORONARY STENT INTERVENTION;  Surgeon: Troy Sine, MD;  Location: [facility];  Service: Cardiovascular;  Laterality: N/A;  . LEFT HEART CATH AND CORONARY ANGIOGRAPHY N/A 03/07/2018   Procedure: LEFT HEART CATH AND CORONARY ANGIOGRAPHY;  Surgeon: Troy Sine, MD;  Location: [facility];  Service: Cardiovascular;  Laterality: N/A;  . MASTECTOMY Right 01/26/2011  . MASTECTOMY COMPLETE / SIMPLE W/ SENTINEL NODE BIOPSY Left 01/26/2011  . OVARIAN CYST SURGERY  1997, 2001, 2008   Allergies  Allergen Reactions  . Elavil [Amitriptyline Hcl] Rash  . Sulfur Rash   Prior to Admission medications   Medication Sig Start Date End Date Taking? Authorizing Provider  amLODipine (NORVASC) 5 MG tablet Take 1 tablet (5 mg total) by mouth daily. 03/11/18  Yes Dunn, Nedra Hai, PA-C  aspirin EC 81 MG tablet Take 1 tablet (81 mg total) by mouth daily. 02/27/18  Yes Troy Sine, MD  atorvastatin (LIPITOR) 80 MG tablet Take 1 tablet (80 mg total) by mouth daily at 6 PM. 03/08/18  Yes Kroeger, Lorelee Cover., PA-C  cetirizine (ZYRTEC) 10 MG tablet Take 10 mg by mouth daily as needed for allergies.    Yes [provider]  clonazePAM (KLONOPIN) 0.5 MG tablet Take 1 tablet (0.5 mg total) by mouth 2 (two) times daily as needed. Patient taking differently: Take 0.5 mg by mouth  daily as needed for anxiety.  01/24/18  Yes [name], MD  FLUoxetine (PROZAC) 20 MG tablet Take 1 tablet (20 mg total) by mouth daily. 09/15/17  Yes [name], MD  fluticasone (FLONASE) 50 MCG/ACT nasal spray Place 2 sprays into both nostrils daily. 06/17/16  Yes [name], MD  metoprolol succinate (TOPROL XL) 25 MG 24 hr tablet Take 0.5 tablets (12.5 mg total) by mouth daily. 03/10/18  Yes Dunn, Dayna N, PA-C  nitroGLYCERIN (NITROSTAT) 0.4 MG SL tablet Place 1 tablet (0.4 mg total) under the tongue every 5 (five) minutes as needed for chest pain. 02/27/18 05/28/18 Yes Troy Sine, MD  ticagrelor (BRILINTA) 90 MG TABS tablet Take 1 tablet (90 mg total) by mouth 2 (two) times  daily. 03/08/18  Yes Kroeger, Daleen Snook M., PA-C  Ascorbic Acid (VITAMIN C) 100 MG tablet Take 100 mg by mouth daily as needed.    [provider]  calcium-vitamin D (OSCAL WITH D) 500-200 MG-UNIT per tablet Take 1 tablet by mouth daily.    [provider]  meclizine (ANTIVERT) 25 MG tablet Take 1 tablet (25 mg total) by mouth 3 (three) times daily as needed for dizziness. Patient not taking: Reported on 04/03/2018 01/24/18   [name], MD   Social History   Socioeconomic History  . Marital status: Divorced    Spouse name: Not on file  . Number of children: Not on file  . Years of education: Not on file  . Highest education level: Not on file  Occupational History  . Not on file  Social Needs  . Financial resource strain: Not on file  . Food insecurity:    Worry: Not on file    Inability: Not on file  . Transportation needs:    Medical: Not on file    Non-medical: Not on file  Tobacco Use  . Smoking status: Never Smoker  . Smokeless tobacco: Never Used  Substance and Sexual Activity  . Alcohol use: Yes    Comment: 03/07/2018 "probably once/month"  . Drug use: No  . Sexual activity: Not on file  Lifestyle  . Physical activity:    Days per week: Not on file    Minutes per session: Not on file  . Stress: Not on file  Relationships  . Social connections:    Talks on phone: Not on file    Gets together: Not on file    Attends religious service: Not on file    Active member of club or organization: Not on file    Attends meetings of clubs or organizations: Not on file    Relationship status: Not on file  . Intimate partner violence:    Fear of current or ex partner: Not on file    Emotionally abused: Not on file    Physically abused: Not on file    Forced sexual activity: Not on file  Other Topics Concern  . Not on file  Social History Narrative  . Not on file   Here for discussion of nodule noted on CT chest/coronary morphology CT obtained July 15.   Has been under care of cardiology for atherosclerotic coronary disease.. 2 mm nodule noted on imaging below.   No history of tobacco use.    History of breast cancer as below, but had bilateral mastectomy. No new cough, no hemoptysis. No night sweats. No unexplained weight loss.   This patient had DCIS of the left breast diagnosed by biopsy in January  2012. She underwent a lumpectomy on 10/05/2010. Close margins were noted. After lengthy discussion the patient elected to proceed to a left mastectomy. On January 26, 2011 she underwent left total mastectomy with axillary sentinel lymph node evaluation. A prophylactic right mastectomy was also performed.  Pathology showed multiple foci of ductal carcinoma in situ in the left breast. Cystoscopy nodes were negative. Right breast was unremarkable. It was ER positive at 100%, PR positive at 100%. Immediate reconstruction was done.    IMPRESSION: 1. 2 mm left upper lobe nodule, nonspecific but statistically likely benign. No follow-up needed if patient is low-risk. Non-contrast chest CT can be considered in 12 months if patient is high-risk. This recommendation follows the consensus statement: Guidelines for Management of Incidental Pulmonary Nodules Detected on CT Images: From the Fleischner Society 2017; Radiology 2017; 284:228-243. 2.  Aortic Atherosclerosis (ICD10-I70.0). Review of Systems     Objective:   Physical Exam  Constitutional: She is oriented to person, place, and time. She appears well-developed and well-nourished. No distress.  HENT:  Head: Normocephalic and atraumatic.  Cardiovascular: Normal rate and regular rhythm.  Pulmonary/Chest: Effort normal and breath sounds normal. She has no wheezes.  Neurological: She is alert and oriented to person, place, and time.  Psychiatric: She has a normal mood and affect.  Vitals reviewed.   Vitals:   04/03/18 1351  BP: 112/64  Pulse: 61  Temp: 97.9 F (36.6 C)  TempSrc: Oral    SpO2: 96%  Weight: 148 lb 9.6 oz (67.4 kg)  Height: 5\' 2"  (1.575 m)       Assessment & Plan:  [name] is a 67 y.o. female Lung nodule  -Very small nodule noted on CT as above.  Discussed overall reassuring size, and recommendations for possible repeat CT scan in 1 year depending on risks, but no known significant risk factors other than prior breast cancer.  She would like to meet with oncologist to get their opinion on repeat testing.  Will check into location of previous oncologist or may need new referral locally.  Need for Tdap vaccination - Plan: Tdap vaccine greater than or equal to 7yo IM given   No orders of the defined types were placed in this encounter.  Patient Instructions   I will check into the status of your previous oncologist and can refer you to new oncologist as needed to discuss the recent CT scan nodule and follow-up plan.  Either way we can certainly recheck that study in 2 months.  *Initial patient instructions as above, should be repeat study in 12 months.   Tdap vaccine given today.  Please schedule physical in the next few months so we can review you other medical history and recommended screening. Thank you for coming in today.  If you have lab work done today you will be contacted with your lab results within the next 2 weeks.  If you have not heard from [place] then please contact us. The fastest way to get your results is to register for My Chart.   IF you received an x-ray today, you will receive an invoice from [facility]. Please contact [facility] at [phone] with questions or concerns regarding your invoice.   IF you received labwork today, you will receive an invoice from [facility]. Please contact [facility] at [phone] with questions or concerns regarding your invoice.   Our billing staff will not be able to assist you with questions regarding bills from these companies.  You will be contacted  with the lab results as  soon as they are available. The fastest way to get your results is to activate your My Chart account. Instructions are located on the last page of this paperwork. If you have not heard from [place] regarding the results in 2 weeks, please contact this office.      Signed,   [name], MD [facility] [place].  04/05/18 3:35 PM

## 2018-04-03 NOTE — Patient Instructions (Addendum)
I will check into the status of your previous oncologist and can refer you to new oncologist as needed to discuss the recent CT scan nodule and follow-up plan.  Either way we can certainly recheck that study in 2 months.   Tdap vaccine given today.  Please schedule physical in the next few months so we can review you other medical history and recommended screening. Thank you for coming in today.  If you have lab work done today you will be contacted with your lab results within the next 2 weeks.  If you have not heard from [place] then please contact us. The fastest way to get your results is to register for My Chart.   IF you received an x-ray today, you will receive an invoice from [facility]. Please contact [facility] at [phone] with questions or concerns regarding your invoice.   IF you received labwork today, you will receive an invoice from [facility]. Please contact [facility] at [phone] with questions or concerns regarding your invoice.   Our billing staff will not be able to assist you with questions regarding bills from these companies.  You will be contacted with the lab results as soon as they are available. The fastest way to get your results is to activate your My Chart account. Instructions are located on the last page of this paperwork. If you have not heard from [place] regarding the results in 2 weeks, please contact this office.

## 2018-04-04 ENCOUNTER — Telehealth: Payer: Self-pay | Admitting: Family Medicine

## 2018-04-04 NOTE — Telephone Encounter (Signed)
Copied from [place] [id number]. Topic: Inquiry >> Apr 04, 2018  4:17 PM Vernona Rieger wrote: Reason for CRM: [name] called from the [place] from [facility] and said they missed a call from the office. The person did not leave a name but just said to call back to [facility], if you call again, please call the [phone] number instead because Dr Eston Esters is no longer practicing there at [facility].

## 2018-04-05 ENCOUNTER — Encounter: Payer: Self-pay | Admitting: Family Medicine

## 2018-04-05 ENCOUNTER — Encounter

## 2018-04-05 ENCOUNTER — Encounter: Payer: Self-pay | Admitting: Neurology

## 2018-04-05 ENCOUNTER — Ambulatory Visit: Payer: PRIVATE HEALTH INSURANCE | Admitting: Neurology

## 2018-04-05 VITALS — BP 109/60 | HR 65 | Ht 62.0 in | Wt 149.0 lb

## 2018-04-05 DIAGNOSIS — H8110 Benign paroxysmal vertigo, unspecified ear: Secondary | ICD-10-CM

## 2018-04-05 DIAGNOSIS — G2581 Restless legs syndrome: Secondary | ICD-10-CM | POA: Diagnosis not present

## 2018-04-05 DIAGNOSIS — G4761 Periodic limb movement disorder: Secondary | ICD-10-CM | POA: Diagnosis not present

## 2018-04-05 MED ORDER — ROTIGOTINE 1 MG/24HR TD PT24: 1.0000 mg | MEDICATED_PATCH | Freq: Every evening | TRANSDERMAL | 0 refills | Status: DC | PRN

## 2018-04-05 NOTE — Patient Instructions (Addendum)
REM sleep disorder or Periodic Limb Movements of Sleep: sleep referral to Dr. Brett Fairy Check Ferritin level for restless legs Samples of Neupro patch for RLS  Restless Legs Syndrome Restless legs syndrome is a condition that causes uncomfortable feelings or sensations in the legs, especially while sitting or lying down. The sensations usually cause an overwhelming urge to move the legs. The arms can also sometimes be affected. The condition can range from mild to severe. The symptoms often interfere with a person's ability to sleep. What are the causes? The cause of this condition is not known. What increases the risk? This condition is more likely to develop in:  People who are older than age 71.  Pregnant women. In general, restless legs syndrome is more common in women than in men.  People who have a family history of the condition.  People who have certain medical conditions, such as iron deficiency, kidney disease, Parkinson disease, or nerve damage.  People who take certain medicines, such as medicines for high blood pressure, nausea, colds, allergies, depression, and some heart conditions.  What are the signs or symptoms? The main symptom of this condition is uncomfortable sensations in the legs. These sensations may be:  Described as pulling, tingling, prickling, throbbing, crawling, or burning.  Worse while you are sitting or lying down.  Worse during periods of rest or inactivity.  Worse at night, often interfering with your sleep.  Accompanied by a very strong urge to move your legs.  Temporarily relieved by movement of your legs.  The sensations usually affect both sides of the body. The arms can also be affected, but this is rare. People who have this condition often have tiredness during the day because of their lack of sleep at night. How is this diagnosed? This condition may be diagnosed based on your description of the symptoms. You may also have tests,  including blood tests, to check for other conditions that may lead to your symptoms. In some cases, you may be asked to spend some time in a sleep lab so your sleeping can be monitored. How is this treated? Treatment for this condition is focused on managing the symptoms. Treatment may include:  Self-help and lifestyle changes.  Medicines.  Follow these instructions at home:  Take medicines only as directed by your health care provider.  Try these methods to get temporary relief from the uncomfortable sensations: ? Massage your legs. ? Walk or stretch. ? Take a cold or hot bath.  Practice good sleep habits. For example, go to bed and get up at the same time every day.  Exercise regularly.  Practice ways of relaxing, such as yoga or meditation.  Avoid caffeine and alcohol.  Do not use any tobacco products, including cigarettes, chewing tobacco, or electronic cigarettes. If you need help quitting, ask your health care provider.  Keep all follow-up visits as directed by your health care provider. This is important. Contact a health care provider if: Your symptoms do not improve with treatment, or they get worse. This information is not intended to replace advice given to you by your health care provider. Make sure you discuss any questions you have with your health care provider. Document Released: 07/23/2002 Document Revised: 01/08/2016 Document Reviewed: 07/29/2014 Elsevier Interactive Patient Education  2018 [employer].  Rotigotine transdermal skin patch What is this medicine? ROTIGOTINE (roe TIG oh teen) is used to control the signs and symptoms of Parkinson's disease or restless legs syndrome. This medicine may be used for  other purposes; ask your health care provider or pharmacist if you have questions. COMMON BRAND NAME(S): Neupro What should I tell my health care provider before I take this medicine? They need to know if you have any of these conditions: -heart  disease -high blood pressure -lung or breathing disease, like asthma -mental illness -skin cancer -sleep disorder -an unusual or allergic reaction to rotigotine, sulfites, other medicines, foods, dyes, or preservatives -pregnant or trying to get pregnant -breast-feeding How should I use this medicine? This medicine is for external use only. Follow the directions on the prescription label. Use exactly as directed. Wash hands after removing and applying this medicine. Change the patch each day at the same time. Apply the patch to an area of the upper arm or body that is clean, dry, and hairless. Do not use this patch on skin that is injured, irritated, oily, or calloused. Do not apply where the patch will be rubbed by tight clothing or a waistband. Do not apply to the same place more than once every 14 days in order to prevent skin irritation. Do not cut or trim the patch. Take your medicine at regular intervals. Do not take it more often than directed. Do not stop taking except on your doctor's advice. Always remove the old patch before you apply a new one. Remove patch slowly and carefully to avoid irritation. After removal, fold the patch so that it sticks to itself and throw it away. After removal of patch, wash the area with soap and water to remove any drug or adhesive. Baby oil or mineral oil may be used if needed. Do not use alcohol or other liquids. Talk to your pediatrician regarding the use of this medicine in children. Special care may be needed. Overdosage: If you think you have taken too much of this medicine contact a poison control center or emergency room at once. NOTE: This medicine is only for you. Do not share this medicine with others. What if I miss a dose? If you miss a dose, take it as soon as you can. If it is almost time for your next dose, take only that dose. Do not take double or extra doses. What may interact with this medicine? -alcohol -antihistamines for allergy,  cough and cold -certain medicines for sleep -medicines for depression, anxiety, or psychotic disturbances -metoclopramide -narcotic medicines for pain This list may not describe all possible interactions. Give your health care provider a list of all the medicines, herbs, non-prescription drugs, or dietary supplements you use. Also tell them if you smoke, drink alcohol, or use illegal drugs. Some items may interact with your medicine. What should I watch for while using this medicine? Visit your doctor for regular check ups. Tell your doctor or healthcare professional if your symptoms do not start to get better or if they get worse. You may get drowsy or dizzy. Do not drive, use machinery, or do anything that needs mental alertness until you know how this medicine affects you. Do not stand or sit up quickly, especially if you are an older patient. This reduces the risk of dizzy or fainting spells. Alcohol may interfere with the effect of this medicine. Avoid alcoholic drinks. If you find that you have sudden feelings of wanting to sleep during normal activities, like cooking, watching television, or while driving or riding in a car, you should contact your health care professional. There have been reports of increased sexual urges or other strong urges such as gambling while taking  this medicine. If you experience any of these while taking this medicine, you should report this to your health care provider as soon as possible. This medicine patch is sensitive to certain body heat changes. If your skin gets too hot, more medicine will come out of the patch. Call your healthcare provider if you get a fever. Do not take hot baths. Do not sunbathe. Do not use hot tubs, saunas, hair dryers, heating pads, electric blankets, heated waterbeds, or tanning lamps. Do not do exercise that increases your body temperature. If you are going to have a magnetic resonance imaging (MRI) procedure, tell your MRI technician if  you have this patch on your body. It must be removed before a MRI. What side effects may I notice from receiving this medicine? Side effects that you should report to your doctor or health care professional as soon as possible: -allergic reactions like skin rash, itching or hives, swelling of the face, lips, or tongue -anxiety, restlessness -breathing problems -confusion -dizziness -falling asleep during normal activities like driving -fast, irregular or slow heartbeat -feeling faint or lightheaded, falls -hallucination, loss of contact with reality -skin irritation, redness, or swelling -uncontrollable head, mouth, neck, arm, or leg movements -uncontrollable and excessive urges (examples: gambling, binge eating, shopping, having sex) Side effects that usually do not require medical attention (report to your doctor or health care professional if they continue or are bothersome): -constipation -difficulty sleeping -headache -loss of appetite -nausea, vomiting -stomach pain -weight gain This list may not describe all possible side effects. Call your doctor for medical advice about side effects. You may report side effects to FDA at 1-800-FDA-1088. Where should I keep my medicine? Keep out of the reach of children. Store at room temperature between 15 and 30 degrees C (59 and 86 degrees F). Keep container tightly closed. Store in original pouch until just before use. Throw away any unused medicine after the expiration date. NOTE: This sheet is a summary. It may not cover all possible information. If you have questions about this medicine, talk to your doctor, pharmacist, or health care provider.  2018 Elsevier/Gold Standard (2016-03-12 15:25:23)

## 2018-04-05 NOTE — Progress Notes (Addendum)
[facility]    Provider:  Dr Jaynee Eagles Referring Provider: [name], MD Primary Care Physician:  [name], MD  CC:  vertigo  HPI:  [name] is a 67 y.o. female here as requested by Dr. Carlota Raspberry for peripheral vertigo.  She woke up one morning and sat up on the edge of the bed then had severe vertigo, severe nausea, laying down staying still and closing eyes helped. Happened when she turned her head. She went to the doctor and Dr. Carlota Raspberry sent her to Dr.Groat who thought she had 6th nerve weakness. When she looked to the left she saw double across the room one next to each other and a little bit skewed.  The vertigo was a few monites, the double vision occurred with prisms. No headache. She has not had anymore symptoms. Since the vertigo she has had a heart stent. She is going to the cardiac rehab program, not doing a whole lot of exercise. [name] is here and provides much information. No other focal neurologic deficits, associated symptoms, inciting events or modifiable factors.  Reviewed notes, labs and imaging from outside physicians, which showed:  Reviewed MRI of the brain and MRI of the head images January 25, 2018 which showed no acute intracranial process only moderate white matter changes microvascular ischemia, no significant stenosis, no prior strokes, normal major intravascular flow does voids present at the skull base per  Patient was seen in June by Dr. Nyoka Cowden [facility] with weakness of the left cranial 6th nerve, recommended neuro imaging MRI of the brain and sed rate for temporal arteritis.  She had a headache that started in the morning slight frontal headache the day before and partial stick enough.  She had vertigo early in the morning.  No focal weakness slurred speech not worst headache of her life, slight nausea with vertigo symptoms but no vomiting, nonfocal neurologic exam in the office.  Initially suspected peripheral  vertigo but did have intermittent blurring or double vision with looking left.   Review of Systems: Patient complains of symptoms per HPI as well as the following symptoms: Insomnia, sleepiness, restless legs, fatigue, easy bruising, easy bleeding, murmur, flushing, not enough sleep, decreased energy. Pertinent negatives and positives per HPI. All others negative.   Social History   Socioeconomic History  . Marital status: Divorced    Spouse name: Not on file  . Number of children: Not on file  . Years of education: Not on file  . Highest education level: Bachelor's degree (e.g., BA, AB, BS)  Occupational History  . Not on file  Social Needs  . Financial resource strain: Not on file  . Food insecurity:    Worry: Not on file    Inability: Not on file  . Transportation needs:    Medical: Not on file    Non-medical: Not on file  Tobacco Use  . Smoking status: Never Smoker  . Smokeless tobacco: Never Used  Substance and Sexual Activity  . Alcohol use: Yes    Comment: 03/07/2018 "probably once/month"  . Drug use: No  . Sexual activity: Not on file  Lifestyle  . Physical activity:    Days per week: Not on file    Minutes per session: Not on file  . Stress: Not on file  Relationships  . Social connections:    Talks on phone: Not on file    Gets together: Not on file    Attends  religious service: Not on file    Active member of club or organization: Not on file    Attends meetings of clubs or organizations: Not on file    Relationship status: Not on file  . Intimate partner violence:    Fear of current or Ellen partner: Not on file    Emotionally abused: Not on file    Physically abused: Not on file    Forced sexual activity: Not on file  Other Topics Concern  . Not on file  Social History Narrative   Lives at home alone   Right handed   Caffeine: 1/2 cup of coffee daily that has (1/2 & 1/2 caf)    Family History  Problem Relation Age of Onset  . Hypertension Mother     . Heart Problems Mother   . Hyperlipidemia Mother   . Hypotension Father   . Other Father        pacemaker  . Heart failure Paternal Grandmother   . Heart failure Paternal Grandfather   . Cancer Brother   . Cancer Sister   . Diabetes Sister     Past Medical History:  Diagnosis Date  . Allergy    "dust mite residue, mold, mildew, grapefruit, chili peppers, cat dander, some weeds" (03/07/2018)  . Anxiety   . Arthritis    "minor; right knee, left hip" (03/07/2018)  . Bruises easily   . Cancer of left breast ([place])    double mastectomy 01/26/2011  . Childhood asthma   . Contact lens/glasses fitting   . Coronary artery disease    a.  She underwent LHC 7/23 which revealed 95% stenosis of proximal LAD, managed with PCI/DES.   . Depression   . Essential hypertension   . GERD (gastroesophageal reflux disease)    "at times" (03/07/2018)  . Heart murmur   . Hiatal hernia   . High cholesterol   . History of kidney stones    "lots; no OR" (03/07/2018)  . Osteoporosis   . Rectal bleeding    hemorroid  . Ringing in ears, bilateral    "since ~ 06/2017" (03/07/2018)  . Sinus drainage     Past Surgical History:  Procedure Laterality Date  . APPENDECTOMY    . BREAST LUMPECTOMY W/ NEEDLE LOCALIZATION Left 10/05/2010   lumpectomy  . BREAST SURGERY    . CORONARY STENT INTERVENTION N/A 03/07/2018   Procedure: CORONARY STENT INTERVENTION;  Surgeon: Troy Sine, MD;  Location: [place] [facility];  Service: Cardiovascular;  Laterality: N/A;  . LEFT HEART CATH AND CORONARY ANGIOGRAPHY N/A 03/07/2018   Procedure: LEFT HEART CATH AND CORONARY ANGIOGRAPHY;  Surgeon: Troy Sine, MD;  Location: [place] [facility];  Service: Cardiovascular;  Laterality: N/A;  . MASTECTOMY Right 01/26/2011  . MASTECTOMY COMPLETE / SIMPLE W/ SENTINEL NODE BIOPSY Left 01/26/2011  . OVARIAN CYST SURGERY  1997, 2001, 2008    Current Outpatient Medications  Medication Sig Dispense Refill  . amLODipine  (NORVASC) 5 MG tablet Take 1 tablet (5 mg total) by mouth daily. 30 tablet 6  . aspirin EC 81 MG tablet Take 1 tablet (81 mg total) by mouth daily. 90 tablet 3  . atorvastatin (LIPITOR) 80 MG tablet Take 1 tablet (80 mg total) by mouth daily at 6 PM. 90 tablet 3  . clonazePAM (KLONOPIN) 0.5 MG tablet Take 1 tablet (0.5 mg total) by mouth 2 (two) times daily as needed. (Patient taking differently: Take 0.5 mg by mouth daily as needed for  anxiety. ) 30 tablet 1  . FLUoxetine (PROZAC) 20 MG tablet Take 1 tablet (20 mg total) by mouth daily. 90 tablet 2  . fluticasone (FLONASE) 50 MCG/ACT nasal spray Place 2 sprays into both nostrils daily. 16 g 6  . metoprolol succinate (TOPROL XL) 25 MG 24 hr tablet Take 0.5 tablets (12.5 mg total) by mouth daily. 30 tablet 6  . ticagrelor (BRILINTA) 90 MG TABS tablet Take 1 tablet (90 mg total) by mouth 2 (two) times daily. 180 tablet 3  . cetirizine (ZYRTEC) 10 MG tablet Take 10 mg by mouth daily as needed for allergies.     Marland Kitchen meclizine (ANTIVERT) 25 MG tablet Take 1 tablet (25 mg total) by mouth 3 (three) times daily as needed for dizziness. (Patient not taking: Reported on 04/03/2018) 30 tablet 0  . nitroGLYCERIN (NITROSTAT) 0.4 MG SL tablet Place 1 tablet (0.4 mg total) under the tongue every 5 (five) minutes as needed for chest pain. 25 tablet 3  . Rotigotine (NEUPRO) 1 MG/24HR PT24 Place 1 patch (1 mg total) onto the skin at bedtime as needed. May remove in the morning. 30 patch 0   No current facility-administered medications for this visit.     Allergies as of 04/05/2018 - Review Complete 04/05/2018  Allergen Reaction Noted  . Elavil [amitriptyline hcl] Rash 02/09/2011  . Sulfur Rash 02/09/2011    Vitals: BP 109/60 (BP Location: Right Arm, Patient Position: Sitting)   Pulse 65   Ht 5\' 2"  (1.575 m)   Wt 149 lb (67.6 kg)   LMP 09/10/2006   BMI 27.25 kg/m  Last Weight:  Wt Readings from Last 1 Encounters:  04/05/18 149 lb (67.6 kg)   Last Height:    Ht Readings from Last 1 Encounters:  04/05/18 5\' 2"  (1.575 m)   Physical exam: Exam: Gen: NAD, conversant, well nourised, well groomed                     CV: RRR, no MRG. No Carotid Bruits. No peripheral edema, warm, nontender Eyes: Conjunctivae clear without exudates or hemorrhage  Neuro: Detailed Neurologic Exam  Speech:    Speech is normal; fluent and spontaneous with normal comprehension.  Cognition:    The patient is oriented to person, place, and time;     recent and remote memory intact;     language fluent;     normal attention, concentration,     fund of knowledge Cranial Nerves:    The pupils are equal, round, and reactive to light. The fundi are normal and spontaneous venous pulsations are present. Visual fields are full to finger confrontation. Extraocular movements are intact. Trigeminal sensation is intact and the muscles of mastication are normal. The face is symmetric. The palate elevates in the midline. Hearing intact. Voice is normal. Shoulder shrug is normal. The tongue has normal motion without fasciculations.   Coordination:    Normal finger to nose  Gait:    Heel-toe and tandem gait are normal.   Motor Observation:    No asymmetry, no atrophy, and no involuntary movements noted. Tone:    Normal muscle tone.    Posture:    Posture is normal. normal erect    Strength:    Strength is V/V in the upper and lower limbs.      Sensation: intact to LT     Reflex Exam:  DTR's:    Deep tendon reflexes in the upper and lower extremities are normal bilaterally.  Toes:    The toes are downgoing bilaterally.   Clonus:    Clonus is absent.       Assessment/Plan:  67 year old with BPPV, resolved. MRI brain/MRA head negative for etiology. Neurologic exam is normal.   White matter changes: Chronic microvascular changes, discussed close management of vascular risk factors  Sleep evaluation: she is excessively tired, florrid dreams, moves around a lot,  finds sheets on the floor - REM sleep disorder vs PLMS. Dr. Brett Fairy sleep eval  RLS: Check ferritin   Request Dr. Zenia Resides notes ophthalmology  Try Neupro for RLS  If BPPV returns recommend vestibular therapy  Orders Placed This Encounter  Procedures  . Ferritin  . Ambulatory referral to Sleep Studies   Meds ordered this encounter  Medications  . Rotigotine (NEUPRO) 1 MG/24HR PT24    Sig: Place 1 patch (1 mg total) onto the skin at bedtime as needed. May remove in the morning.    Dispense:  30 patch    Refill:  0       Sarina Ill, MD  [facility] [address] [place], [place] [address]  Phone [phone] Fax [phone]

## 2018-04-06 ENCOUNTER — Telehealth: Payer: Self-pay | Admitting: *Deleted

## 2018-04-06 LAB — FERRITIN: FERRITIN: 30 ng/mL (ref 15–150)

## 2018-04-06 NOTE — Telephone Encounter (Signed)
Attempted to reach patient with lab results. No answer and voice MB full.

## 2018-04-06 NOTE — Addendum Note (Signed)
Addended by: Sarina Ill B on: 04/06/2018 11:27 AM   Modules accepted: Orders

## 2018-04-06 NOTE — Telephone Encounter (Signed)
Called pt and discussed that her ferritin level is very low and with restless leg, we want it greater than 75. An iron supplement will help with this and Dr. Jaynee Eagles recommends that she take ferrous sulfate 325 mg twice daily on an empty stomach. Taking with Vitamin C can help the absorption and she may even be able to purchase an iron supplement that has vitamin C with it. She was informed of the study findings that have shown improvement in symptoms. Patient asked how far she should separate this from her Brilinta and Amlodipine. RN advised to try to separate them by at least an hour but advised pt to speak with the pharmacist about this when she picks up the iron as they would be an excellent resource. Asked for a call back if she has further questions after speaking with them. Pt verbalized understanding and appreciation.   ------------------------------- Notes recorded by Melvenia Beam, MD on 04/06/2018 at 10:01 AM EDT Ferritin level is very low 30. In people with Restless Leg Syndrome we want it greater than 75. Taking iron supplement may significantly improve RLS. Studies have shown that in patients whose serum ferritin was < 75 g/l, oral iron therapy (325 mg ferrous sulfate twice a day on an empty stomach) on average improved RLS symptom after 3 months. Taking it with Vitamin C may help absorption and you can buy it in the store with vitamin c included in the pill. I recommend starting this.

## 2018-04-07 NOTE — Telephone Encounter (Signed)
Please see note below.Marland KitchenMarland KitchenMarland KitchenI dont see where anyone called them

## 2018-04-07 NOTE — Telephone Encounter (Signed)
It seems that you were trying to get a hold of them???

## 2018-04-12 ENCOUNTER — Encounter: Payer: Self-pay | Admitting: Family Medicine

## 2018-04-12 NOTE — Telephone Encounter (Signed)
Dr. Truddie Coco no longer local. Message left with breast cancer coordinator at [facility] to decide who to meet with.

## 2018-04-13 ENCOUNTER — Encounter: Payer: Self-pay | Admitting: Family Medicine

## 2018-04-14 ENCOUNTER — Ambulatory Visit: Payer: PRIVATE HEALTH INSURANCE | Admitting: Family Medicine

## 2018-04-14 ENCOUNTER — Other Ambulatory Visit: Payer: Self-pay

## 2018-04-14 ENCOUNTER — Encounter: Payer: Self-pay | Admitting: Family Medicine

## 2018-04-14 ENCOUNTER — Ambulatory Visit (INDEPENDENT_AMBULATORY_CARE_PROVIDER_SITE_OTHER): Payer: PRIVATE HEALTH INSURANCE

## 2018-04-14 VITALS — BP 110/60 | HR 71 | Temp 98.2°F | Resp 16 | Wt 150.6 lb

## 2018-04-14 DIAGNOSIS — R1030 Lower abdominal pain, unspecified: Secondary | ICD-10-CM

## 2018-04-14 DIAGNOSIS — M545 Low back pain, unspecified: Secondary | ICD-10-CM

## 2018-04-14 DIAGNOSIS — Z87442 Personal history of urinary calculi: Secondary | ICD-10-CM

## 2018-04-14 DIAGNOSIS — F418 Other specified anxiety disorders: Secondary | ICD-10-CM

## 2018-04-14 DIAGNOSIS — N390 Urinary tract infection, site not specified: Secondary | ICD-10-CM

## 2018-04-14 DIAGNOSIS — R110 Nausea: Secondary | ICD-10-CM | POA: Diagnosis not present

## 2018-04-14 LAB — POCT URINALYSIS DIP (MANUAL ENTRY)
BILIRUBIN UA: NEGATIVE
Blood, UA: NEGATIVE
GLUCOSE UA: NEGATIVE mg/dL
Nitrite, UA: NEGATIVE
PH UA: 7 (ref 5.0–8.0)
Protein Ur, POC: 30 mg/dL — AB
Spec Grav, UA: 1.02 (ref 1.010–1.025)
Urobilinogen, UA: 2 E.U./dL — AB

## 2018-04-14 LAB — POCT CBC
Granulocyte percent: 71.6 %G (ref 37–80)
HCT, POC: 39 % (ref 37.7–47.9)
HEMOGLOBIN: 12.5 g/dL (ref 12.2–16.2)
LYMPH, POC: 1.8 (ref 0.6–3.4)
MCH, POC: 29.6 pg (ref 27–31.2)
MCHC: 32 g/dL (ref 31.8–35.4)
MCV: 92.6 fL (ref 80–97)
MID (cbc): 0.6 (ref 0–0.9)
MPV: 6.3 fL (ref 0–99.8)
POC Granulocyte: 6.1 (ref 2–6.9)
POC LYMPH PERCENT: 21.3 %L (ref 10–50)
POC MID %: 7.1 % (ref 0–12)
Platelet Count, POC: 311 10*3/uL (ref 142–424)
RBC: 4.21 M/uL (ref 4.04–5.48)
RDW, POC: 13.6 %
WBC: 8.5 10*3/uL (ref 4.6–10.2)

## 2018-04-14 LAB — POC MICROSCOPIC URINALYSIS (UMFC): MUCUS RE: ABSENT

## 2018-04-14 MED ORDER — NITROFURANTOIN MONOHYD MACRO 100 MG PO CAPS: 100.0000 mg | ORAL_CAPSULE | Freq: Two times a day (BID) | ORAL | 0 refills | Status: DC

## 2018-04-14 MED ORDER — ONDANSETRON 4 MG PO TBDP: 4.0000 mg | ORAL_TABLET | Freq: Three times a day (TID) | ORAL | 0 refills | Status: DC | PRN

## 2018-04-14 MED ORDER — HYDROCODONE-ACETAMINOPHEN 5-325 MG PO TABS: 1.0000 | ORAL_TABLET | Freq: Four times a day (QID) | ORAL | 0 refills | Status: DC | PRN

## 2018-04-14 NOTE — Progress Notes (Signed)
Subjective:  By signing my name below, I, [name], attest that this documentation has been prepared under the direction and in the presence of [name], MD Electronically Signed: Ladene Artist, ED Scribe 04/14/2018 at 4:32 PM.   Patient ID: [name], female    DOB: 1951/07/28, 67 y.o.   MRN: [id number]  Chief Complaint  Patient presents with  . Back Pain    pt states she feels she may be passing a kidney stone x 2 days    HPI [name] is a 67 y.o. female who presents to Primary Care at [facility] complaining of persistent back pain, initially on R now on the L within the past hr, radiating to both sides of abdomen x 2 days. Pt reports mild nausea this morning that has resolved. She has tried Tylenol and heating pad with temporary relief. Denies hematuria, dysuria, urinary frequency, difficulty urinating, fever, vomiting, twisting or turning injury. H/o recurrent kidney stones; pt states this feels the same.  Mentioned email sent regarding anxiety. See email 8/28, felt overwhelmed at work. Option to increase Prozac to 40 mg qd. Most recent email, she spoke with HR at work, considering medial leave of absence due to anxiety/feeling overwhelmed noted since cardiac procedure. Plans to provide FMLA paperwork and will discuss more details on the phone.  Depression screen [facility] 2/9 04/14/2018 04/03/2018 01/24/2018 09/27/2017 12/27/2016  Decreased Interest 1 0 0 0 0  Down, Depressed, Hopeless 1 0 0 0 0  PHQ - 2 Score 2 0 0 0 0  Altered sleeping 0 - - - -  Tired, decreased energy 0 - - - -  Change in appetite 0 - - - -  Feeling bad or failure about yourself  0 - - - -  Trouble concentrating 0 - - - -  Moving slowly or fidgety/restless 0 - - - -  Suicidal thoughts 0 - - - -  PHQ-9 Score 2 - - - -    Patient Active Problem List   Diagnosis Date Noted  . RLS (restless legs syndrome) 04/05/2018  . Atypical chest pain 03/10/2018  . CAD in native artery 03/10/2018  . Elevated  blood pressure reading 03/10/2018  . Palpitations 03/10/2018  . Chest pain 03/09/2018  . Hyperlipidemia 03/08/2018  . Coronary artery disease involving native coronary artery of native heart with unstable angina pectoris ([place]) 03/07/2018  . Unstable angina ([place])   . Cough 01/02/2013  . Anxiety state 01/02/2013  . Breast cancer (DCIS), stage 0, Left, receptor +, dx 2012 09/08/2010   Past Medical History:  Diagnosis Date  . Allergy    "dust mite residue, mold, mildew, grapefruit, chili peppers, cat dander, some weeds" (03/07/2018)  . Anxiety   . Arthritis    "minor; right knee, left hip" (03/07/2018)  . Bruises easily   . Cancer of left breast ([place])    double mastectomy 01/26/2011  . Childhood asthma   . Contact lens/glasses fitting   . Coronary artery disease    a.  She underwent LHC 7/23 which revealed 95% stenosis of proximal LAD, managed with PCI/DES.   . Depression   . Essential hypertension   . GERD (gastroesophageal reflux disease)    "at times" (03/07/2018)  . Heart murmur   . Hiatal hernia   . High cholesterol   . History of kidney stones    "lots; no OR" (03/07/2018)  . Osteoporosis   . Rectal bleeding    hemorroid  . Ringing in  ears, bilateral    "since ~ 06/2017" (03/07/2018)  . Sinus drainage    Past Surgical History:  Procedure Laterality Date  . APPENDECTOMY    . BREAST LUMPECTOMY W/ NEEDLE LOCALIZATION Left 10/05/2010   lumpectomy  . BREAST SURGERY    . CORONARY STENT INTERVENTION N/A 03/07/2018   Procedure: CORONARY STENT INTERVENTION;  Surgeon: Troy Sine, MD;  Location: [place] [facility];  Service: Cardiovascular;  Laterality: N/A;  . LEFT HEART CATH AND CORONARY ANGIOGRAPHY N/A 03/07/2018   Procedure: LEFT HEART CATH AND CORONARY ANGIOGRAPHY;  Surgeon: Troy Sine, MD;  Location: [place] [facility];  Service: Cardiovascular;  Laterality: N/A;  . MASTECTOMY Right 01/26/2011  . MASTECTOMY COMPLETE / SIMPLE W/ SENTINEL NODE BIOPSY Left 01/26/2011    . OVARIAN CYST SURGERY  1997, 2001, 2008   Allergies  Allergen Reactions  . Elavil [Amitriptyline Hcl] Rash  . Sulfur Rash   Prior to Admission medications   Medication Sig Start Date End Date Taking? Authorizing Provider  amLODipine (NORVASC) 5 MG tablet Take 1 tablet (5 mg total) by mouth daily. 03/11/18  Yes Dunn, Nedra Hai, PA-C  aspirin EC 81 MG tablet Take 1 tablet (81 mg total) by mouth daily. 02/27/18  Yes Troy Sine, MD  atorvastatin (LIPITOR) 80 MG tablet Take 1 tablet (80 mg total) by mouth daily at 6 PM. 03/08/18  Yes Kroeger, Lorelee Cover., PA-C  cetirizine (ZYRTEC) 10 MG tablet Take 10 mg by mouth daily as needed for allergies.    Yes [provider]  clonazePAM (KLONOPIN) 0.5 MG tablet Take 1 tablet (0.5 mg total) by mouth 2 (two) times daily as needed. Patient taking differently: Take 0.5 mg by mouth daily as needed for anxiety.  01/24/18  Yes [name], MD  FLUoxetine (PROZAC) 20 MG tablet Take 1 tablet (20 mg total) by mouth daily. 09/15/17  Yes [name], MD  fluticasone (FLONASE) 50 MCG/ACT nasal spray Place 2 sprays into both nostrils daily. 06/17/16  Yes [name], MD  meclizine (ANTIVERT) 25 MG tablet Take 1 tablet (25 mg total) by mouth 3 (three) times daily as needed for dizziness. 01/24/18  Yes [name], MD  metoprolol succinate (TOPROL XL) 25 MG 24 hr tablet Take 0.5 tablets (12.5 mg total) by mouth daily. 03/10/18  Yes Dunn, Dayna N, PA-C  nitroGLYCERIN (NITROSTAT) 0.4 MG SL tablet Place 1 tablet (0.4 mg total) under the tongue every 5 (five) minutes as needed for chest pain. 02/27/18 05/28/18 Yes Troy Sine, MD  Rotigotine (NEUPRO) 1 MG/24HR PT24 Place 1 patch (1 mg total) onto the skin at bedtime as needed. May remove in the morning. 04/05/18  Yes Melvenia Beam, MD  ticagrelor (BRILINTA) 90 MG TABS tablet Take 1 tablet (90 mg total) by mouth 2 (two) times daily. 03/08/18  Yes Kroeger, Lorelee Cover., PA-C   Social History    Socioeconomic History  . Marital status: Divorced    Spouse name: Not on file  . Number of children: Not on file  . Years of education: Not on file  . Highest education level: Bachelor's degree (e.g., BA, AB, BS)  Occupational History  . Not on file  Social Needs  . Financial resource strain: Not on file  . Food insecurity:    Worry: Not on file    Inability: Not on file  . Transportation needs:    Medical: Not on file    Non-medical: Not on file  Tobacco Use  . Smoking status: Never Smoker  . Smokeless tobacco: Never Used  Substance and Sexual Activity  . Alcohol use: Yes    Comment: 03/07/2018 "probably once/month"  . Drug use: No  . Sexual activity: Not on file  Lifestyle  . Physical activity:    Days per week: Not on file    Minutes per session: Not on file  . Stress: Not on file  Relationships  . Social connections:    Talks on phone: Not on file    Gets together: Not on file    Attends religious service: Not on file    Active member of club or organization: Not on file    Attends meetings of clubs or organizations: Not on file    Relationship status: Not on file  . Intimate partner violence:    Fear of current or ex partner: Not on file    Emotionally abused: Not on file    Physically abused: Not on file    Forced sexual activity: Not on file  Other Topics Concern  . Not on file  Social History Narrative   Lives at home alone   Right handed   Caffeine: 1/2 cup of coffee daily that has (1/2 & 1/2 caf)   Review of Systems  Constitutional: Negative for fever.  Gastrointestinal: Positive for abdominal pain and nausea (resolved). Negative for vomiting.  Genitourinary: Negative for difficulty urinating, dysuria, frequency and hematuria.  Musculoskeletal: Positive for back pain.      Objective:   Physical Exam  Constitutional: She is oriented to person, place, and time. She appears well-developed and well-nourished. No distress.  HENT:  Head:  Normocephalic and atraumatic.  Eyes: Conjunctivae and EOM are normal.  Neck: Neck supple. No tracheal deviation present.  Cardiovascular: Normal rate.  Pulmonary/Chest: Effort normal. No respiratory distress.  Abdominal: Bowel sounds are normal. There is tenderness (slight; R>L) in the suprapubic area. There is no rebound, no guarding, no CVA tenderness and negative Murphy's sign.  Musculoskeletal: Normal range of motion.  Localized pain to mid to lower back bilat paraspinals.  Neurological: She is alert and oriented to person, place, and time.  Skin: Skin is warm and dry.  Psychiatric: She has a normal mood and affect. Her behavior is normal.  Nursing note and vitals reviewed.  Vitals:   04/14/18 1605  BP: 110/60  Pulse: 71  Resp: 16  Temp: 98.2 F (36.8 C)  TempSrc: Oral  SpO2: 96%  Weight: 150 lb 9.6 oz (68.3 kg)   Results for orders placed or performed in visit on 04/14/18  POCT urinalysis dipstick  Result Value Ref Range   Color, UA yellow yellow   Clarity, UA clear clear   Glucose, UA negative negative mg/dL   Bilirubin, UA negative negative   Ketones, POC UA trace (5) (A) negative mg/dL   Spec Grav, UA 1.020 1.010 - 1.025   Blood, UA negative negative   pH, UA 7.0 5.0 - 8.0   Protein Ur, POC =30 (A) negative mg/dL   Urobilinogen, UA 2.0 (A) 0.2 or 1.0 E.U./dL   Nitrite, UA Negative Negative   Leukocytes, UA Trace (A) Negative  POCT Microscopic Urinalysis ([facility])  Result Value Ref Range   WBC,UR,HPF,POC Moderate (A) None WBC/hpf   RBC,UR,HPF,POC None None RBC/hpf   Bacteria Few (A) None, Too numerous to count   Mucus Absent Absent   Epithelial Cells, UR Per Microscopy Moderate (A) None, Too numerous to count cells/hpf  POCT CBC  Result Value Ref Range   WBC 8.5 4.6 - 10.2 K/uL   Lymph, poc 1.8 0.6 - 3.4   POC LYMPH PERCENT 21.3 10 - 50 %L   MID (cbc) 0.6 0 - 0.9   POC MID % 7.1 0 - 12 %M   POC Granulocyte 6.1 2 - 6.9   Granulocyte percent 71.6 37 - 80 %G    RBC 4.21 4.04 - 5.48 M/uL   Hemoglobin 12.5 12.2 - 16.2 g/dL   HCT, POC 39.0 37.7 - 47.9 %   MCV 92.6 80 - 97 fL   MCH, POC 29.6 27 - 31.2 pg   MCHC 32.0 31.8 - 35.4 g/dL   RDW, POC 13.6 %   Platelet Count, POC 311 142 - 424 K/uL   MPV 6.3 0 - 99.8 fL   Dg Abd 1 View  Result Date: 04/14/2018 CLINICAL DATA:  Flank pain EXAM: ABDOMEN - 1 VIEW COMPARISON:  09/27/2017 FINDINGS: The bowel gas pattern is normal. No radio-opaque calculi or other significant radiographic abnormality are seen. IMPRESSION: No radiopaque nephroureterolithiasis. Electronically Signed   By: Ulyses Jarred M.D.   On: 04/14/2018 16:51      Assessment & Plan:    [name] is a 67 y.o. female Lower abdominal pain - Plan: DG Abd 1 View, Urine Culture, POCT Microscopic Urinalysis ([facility]), Basic metabolic panel, POCT CBC, HYDROcodone-acetaminophen (NORCO/VICODIN) 5-325 MG tablet Acute bilateral low back pain without sciatica - Plan: POCT urinalysis dipstick Nausea without vomiting - Plan: Basic metabolic panel, ondansetron (ZOFRAN ODT) 4 MG disintegrating tablet History of kidney stones - Plan: HYDROcodone-acetaminophen (NORCO/VICODIN) 5-325 MG tablet Urinary tract infection without hematuria, site unspecified - Plan: nitrofurantoin, macrocrystal-monohydrate, (MACROBID) 100 MG capsule  - possible UTI vs. Less likely nephrolith as no hematuria. XR without stone.   - start macrobid, urine culture  - zofran for nausea, hydrocodone if needed for pain.   - recheck in 2-3 days if not improved, consider CT renal stone study.   Situational anxiety  - increased anxiety with recent medical changes. Planned increased prozac dosing, has Klonopin if needed. Will discuss further by phone, but may need time off work, [employer] paperwork.   Meds ordered this encounter  Medications  . HYDROcodone-acetaminophen (NORCO/VICODIN) 5-325 MG tablet    Sig: Take 1 tablet by mouth every 6 (six) hours as needed for moderate pain.    Dispense:   15 tablet    Refill:  0  . ondansetron (ZOFRAN ODT) 4 MG disintegrating tablet    Sig: Take 1 tablet (4 mg total) by mouth every 8 (eight) hours as needed for nausea or vomiting.    Dispense:  10 tablet    Refill:  0  . nitrofurantoin, macrocrystal-monohydrate, (MACROBID) 100 MG capsule    Sig: Take 1 capsule (100 mg total) by mouth 2 (two) times daily.    Dispense:  14 capsule    Refill:  0   Patient Instructions   X-ray did not indicate kidney stone, can still use the filter and if you do obtain a stone, please bring that in for analysis.  There are some possible signs of infection on the urine test, so start antibiotic twice per day and I will check a urine culture.  For pain I did write for hydrocodone if needed, and Zofran if needed for nausea.  If not improving in the next 2 to 3 days, would recommend recheck to decide on imaging or other workup.   Return to the clinic  or go to the nearest emergency room if any of your symptoms worsen or new symptoms occur.  I will call you to discuss anxiety symptoms and plan further, along with FMLA paperwork. Ok to take prozac 65m total for now. See other stress information below. Do not take clonazepam when you are taking hydrocodone.    Stress and Stress Management Stress is a normal reaction to life events. It is what you feel when life demands more than you are used to or more than you can handle. Some stress can be useful. For example, the stress reaction can help you catch the last bus of the day, study for a test, or meet a deadline at work. But stress that occurs too often or for too long can cause problems. It can affect your emotional health and interfere with relationships and normal daily activities. Too much stress can weaken your immune system and increase your risk for physical illness. If you already have a medical problem, stress can make it worse. What are the causes? All sorts of life events may cause stress. An event that causes  stress for one person may not be stressful for another person. Major life events commonly cause stress. These may be positive or negative. Examples include losing your job, moving into a new home, getting married, having a baby, or losing a loved one. Less obvious life events may also cause stress, especially if they occur day after day or in combination. Examples include working long hours, driving in traffic, caring for children, being in debt, or being in a difficult relationship. What are the signs or symptoms? Stress may cause emotional symptoms including, the following:  Anxiety. This is feeling worried, afraid, on edge, overwhelmed, or out of control.  Anger. This is feeling irritated or impatient.  Depression. This is feeling sad, down, helpless, or guilty.  Difficulty focusing, remembering, or making decisions.  Stress may cause physical symptoms, including the following:  Aches and pains. These may affect your head, neck, back, stomach, or other areas of your body.  Tight muscles or clenched jaw.  Low energy or trouble sleeping.  Stress may cause unhealthy behaviors, including the following:  Eating to feel better (overeating) or skipping meals.  Sleeping too little, too much, or both.  Working too much or putting off tasks (procrastination).  Smoking, drinking alcohol, or using drugs to feel better.  How is this diagnosed? Stress is diagnosed through an assessment by your health care provider. Your health care provider will ask questions about your symptoms and any stressful life events.Your health care provider will also ask about your medical history and may order blood tests or other tests. Certain medical conditions and medicine can cause physical symptoms similar to stress. Mental illness can cause emotional symptoms and unhealthy behaviors similar to stress. Your health care provider may refer you to a mental health professional for further evaluation. How is this  treated? Stress management is the recommended treatment for stress.The goals of stress management are reducing stressful life events and coping with stress in healthy ways. Techniques for reducing stressful life events include the following:  Stress identification. Self-monitor for stress and identify what causes stress for you. These skills may help you to avoid some stressful events.  Time management. Set your priorities, keep a calendar of events, and learn to say "no." These tools can help you avoid making too many commitments.  Techniques for coping with stress include the following:  Rethinking the problem. Try to think  realistically about stressful events rather than ignoring them or overreacting. Try to find the positives in a stressful situation rather than focusing on the negatives.  Exercise. Physical exercise can release both physical and emotional tension. The key is to find a form of exercise you enjoy and do it regularly.  Relaxation techniques. These relax the body and mind. Examples include yoga, meditation, tai chi, biofeedback, deep breathing, progressive muscle relaxation, listening to music, being out in nature, journaling, and other hobbies. Again, the key is to find one or more that you enjoy and can do regularly.  Healthy lifestyle. Eat a balanced diet, get plenty of sleep, and do not smoke. Avoid using alcohol or drugs to relax.  Strong support network. Spend time with family, friends, or other people you enjoy being around.Express your feelings and talk things over with someone you trust.  Counseling or talktherapy with a mental health professional may be helpful if you are having difficulty managing stress on your own. Medicine is typically not recommended for the treatment of stress.Talk to your health care provider if you think you need medicine for symptoms of stress. Follow these instructions at home:  Keep all follow-up visits as directed by your health care  provider.  Take all medicines as directed by your health care provider. Contact a health care provider if:  Your symptoms get worse or you start having new symptoms.  You feel overwhelmed by your problems and can no longer manage them on your own. Get help right away if:  You feel like hurting yourself or someone else. This information is not intended to replace advice given to you by your health care provider. Make sure you discuss any questions you have with your health care provider. Document Released: 01/26/2001 Document Revised: 01/08/2016 Document Reviewed: 03/27/2013 Elsevier Interactive Patient Education  2017 Elsevier Inc.    Back Pain, Adult Many adults have back pain from time to time. Common causes of back pain include:  A strained muscle or ligament.  Wear and tear (degeneration) of the spinal disks.  Arthritis.  A hit to the back.  Back pain can be short-lived (acute) or last a long time (chronic). A physical exam, lab tests, and imaging studies may be done to find the cause of your pain. Follow these instructions at home: Managing pain and stiffness  Take over-the-counter and prescription medicines only as told by your health care provider.  If directed, apply heat to the affected area as often as told by your health care provider. Use the heat source that your health care provider recommends, such as a moist heat pack or a heating pad. ? Place a towel between your skin and the heat source. ? Leave the heat on for 20-30 minutes. ? Remove the heat if your skin turns bright red. This is especially important if you are unable to feel pain, heat, or cold. You have a greater risk of getting burned.  If directed, apply ice to the injured area: ? Put ice in a plastic bag. ? Place a towel between your skin and the bag. ? Leave the ice on for 20 minutes, 2-3 times a day for the first 2-3 days. Activity  Do not stay in bed. Resting more than 1-2 days can delay your  recovery.  Take short walks on even surfaces as soon as you are able. Try to increase the length of time you walk each day.  Do not sit, drive, or stand in one place for more than  30 minutes at a time. Sitting or standing for long periods of time can put stress on your back.  Use proper lifting techniques. When you bend and lift, use positions that put less stress on your back: ? [place] your knees. ? Keep the load close to your body. ? Avoid twisting.  Exercise regularly as told by your health care provider. Exercising will help your back heal faster. This also helps prevent back injuries by keeping muscles strong and flexible.  Your health care provider may recommend that you see a physical therapist. This person can help you come up with a safe exercise program. Do any exercises as told by your physical therapist. Lifestyle  Maintain a healthy weight. Extra weight puts stress on your back and makes it difficult to have good posture.  Avoid activities or situations that make you feel anxious or stressed. Learn ways to manage anxiety and stress. One way to manage stress is through exercise. Stress and anxiety increase muscle tension and can make back pain worse. General instructions  Sleep on a firm mattress in a comfortable position. Try lying on your side with your knees slightly bent. If you lie on your back, put a pillow under your knees.  Follow your treatment plan as told by your health care provider. This may include: ? Cognitive or behavioral therapy. ? Acupuncture or massage therapy. ? Meditation or yoga. Contact a health care provider if:  You have pain that is not relieved with rest or medicine.  You have increasing pain going down into your legs or buttocks.  Your pain does not improve in 2 weeks.  You have pain at night.  You lose weight.  You have a fever or chills. Get help right away if:  You develop new bowel or bladder control problems.  You have unusual  weakness or numbness in your arms or legs.  You develop nausea or vomiting.  You develop abdominal pain.  You feel faint. Summary  Many adults have back pain from time to time. A physical exam, lab tests, and imaging studies may be done to find the cause of your pain.  Use proper lifting techniques. When you bend and lift, use positions that put less stress on your back.  Take over-the-counter and prescription medicines and apply heat or ice as directed by your health care provider. This information is not intended to replace advice given to you by your health care provider. Make sure you discuss any questions you have with your health care provider. Document Released: 08/02/2005 Document Revised: 09/06/2016 Document Reviewed: 09/06/2016 Elsevier Interactive Patient Education  2018 [employer].   Urinary Tract Infection, Adult A urinary tract infection (UTI) is an infection of any part of the urinary tract. The urinary tract includes the:  Kidneys.  Ureters.  Bladder.  Urethra.  These organs make, store, and get rid of pee (urine) in the body. Follow these instructions at home:  Take over-the-counter and prescription medicines only as told by your doctor.  If you were prescribed an antibiotic medicine, take it as told by your doctor. Do not stop taking the antibiotic even if you start to feel better.  Avoid the following drinks: ? Alcohol. ? Caffeine. ? Tea. ? Carbonated drinks.  Drink enough fluid to keep your pee clear or pale yellow.  Keep all follow-up visits as told by your doctor. This is important.  Make sure to: ? Empty your bladder often and completely. Do not to hold pee for long periods  of time. ? Empty your bladder before and after sex. ? Wipe from front to back after a bowel movement if you are female. Use each tissue one time when you wipe. Contact a doctor if:  You have back pain.  You have a fever.  You feel sick to your stomach  (nauseous).  You throw up (vomit).  Your symptoms do not get better after 3 days.  Your symptoms go away and then come back. Get help right away if:  You have very bad back pain.  You have very bad lower belly (abdominal) pain.  You are throwing up and cannot keep down any medicines or water. This information is not intended to replace advice given to you by your health care provider. Make sure you discuss any questions you have with your health care provider. Document Released: 01/19/2008 Document Revised: 01/08/2016 Document Reviewed: 06/23/2015 Elsevier Interactive Patient Education  [employer].   If you have lab work done today you will be contacted with your lab results within the next 2 weeks.  If you have not heard from [place] then please contact us. The fastest way to get your results is to register for My Chart.   IF you received an x-ray today, you will receive an invoice from [facility]. Please contact [facility] at [phone] with questions or concerns regarding your invoice.   IF you received labwork today, you will receive an invoice from [place]. Please contact [facility] at [phone] with questions or concerns regarding your invoice.   Our billing staff will not be able to assist you with questions regarding bills from these companies.  You will be contacted with the lab results as soon as they are available. The fastest way to get your results is to activate your My Chart account. Instructions are located on the last page of this paperwork. If you have not heard from [place] regarding the results in 2 weeks, please contact this office.       I personally performed the services described in this documentation, which was scribed in my presence. The recorded information has been reviewed and considered for accuracy and completeness, addended by me as needed, and agree with information above.  Signed,   [name], MD [facility]  [place].  04/16/18 9:48 PM

## 2018-04-14 NOTE — Patient Instructions (Addendum)
X-ray did not indicate kidney stone, can still use the filter and if you do obtain a stone, please bring that in for analysis.  There are some possible signs of infection on the urine test, so start antibiotic twice per day and I will check a urine culture.  For pain I did write for hydrocodone if needed, and Zofran if needed for nausea.  If not improving in the next 2 to 3 days, would recommend recheck to decide on imaging or other workup.   Return to the clinic or go to the nearest emergency room if any of your symptoms worsen or new symptoms occur.  I will call you to discuss anxiety symptoms and plan further, along with FMLA paperwork. Ok to take prozac 36m total for now. See other stress information below. Do not take clonazepam when you are taking hydrocodone.    Stress and Stress Management Stress is a normal reaction to life events. It is what you feel when life demands more than you are used to or more than you can handle. Some stress can be useful. For example, the stress reaction can help you catch the last bus of the day, study for a test, or meet a deadline at work. But stress that occurs too often or for too long can cause problems. It can affect your emotional health and interfere with relationships and normal daily activities. Too much stress can weaken your immune system and increase your risk for physical illness. If you already have a medical problem, stress can make it worse. What are the causes? All sorts of life events may cause stress. An event that causes stress for one person may not be stressful for another person. Major life events commonly cause stress. These may be positive or negative. Examples include losing your job, moving into a new home, getting married, having a baby, or losing a loved one. Less obvious life events may also cause stress, especially if they occur day after day or in combination. Examples include working long hours, driving in traffic, caring for  children, being in debt, or being in a difficult relationship. What are the signs or symptoms? Stress may cause emotional symptoms including, the following:  Anxiety. This is feeling worried, afraid, on edge, overwhelmed, or out of control.  Anger. This is feeling irritated or impatient.  Depression. This is feeling sad, down, helpless, or guilty.  Difficulty focusing, remembering, or making decisions.  Stress may cause physical symptoms, including the following:  Aches and pains. These may affect your head, neck, back, stomach, or other areas of your body.  Tight muscles or clenched jaw.  Low energy or trouble sleeping.  Stress may cause unhealthy behaviors, including the following:  Eating to feel better (overeating) or skipping meals.  Sleeping too little, too much, or both.  Working too much or putting off tasks (procrastination).  Smoking, drinking alcohol, or using drugs to feel better.  How is this diagnosed? Stress is diagnosed through an assessment by your health care provider. Your health care provider will ask questions about your symptoms and any stressful life events.Your health care provider will also ask about your medical history and may order blood tests or other tests. Certain medical conditions and medicine can cause physical symptoms similar to stress. Mental illness can cause emotional symptoms and unhealthy behaviors similar to stress. Your health care provider may refer you to a mental health professional for further evaluation. How is this treated? Stress management is the recommended treatment for  stress.The goals of stress management are reducing stressful life events and coping with stress in healthy ways. Techniques for reducing stressful life events include the following:  Stress identification. Self-monitor for stress and identify what causes stress for you. These skills may help you to avoid some stressful events.  Time management. Set your  priorities, keep a calendar of events, and learn to say "no." These tools can help you avoid making too many commitments.  Techniques for coping with stress include the following:  Rethinking the problem. Try to think realistically about stressful events rather than ignoring them or overreacting. Try to find the positives in a stressful situation rather than focusing on the negatives.  Exercise. Physical exercise can release both physical and emotional tension. The key is to find a form of exercise you enjoy and do it regularly.  Relaxation techniques. These relax the body and mind. Examples include yoga, meditation, tai chi, biofeedback, deep breathing, progressive muscle relaxation, listening to music, being out in nature, journaling, and other hobbies. Again, the key is to find one or more that you enjoy and can do regularly.  Healthy lifestyle. Eat a balanced diet, get plenty of sleep, and do not smoke. Avoid using alcohol or drugs to relax.  Strong support network. Spend time with family, friends, or other people you enjoy being around.Express your feelings and talk things over with someone you trust.  Counseling or talktherapy with a mental health professional may be helpful if you are having difficulty managing stress on your own. Medicine is typically not recommended for the treatment of stress.Talk to your health care provider if you think you need medicine for symptoms of stress. Follow these instructions at home:  Keep all follow-up visits as directed by your health care provider.  Take all medicines as directed by your health care provider. Contact a health care provider if:  Your symptoms get worse or you start having new symptoms.  You feel overwhelmed by your problems and can no longer manage them on your own. Get help right away if:  You feel like hurting yourself or someone else. This information is not intended to replace advice given to you by your health care  provider. Make sure you discuss any questions you have with your health care provider. Document Released: 01/26/2001 Document Revised: 01/08/2016 Document Reviewed: 03/27/2013 Elsevier Interactive Patient Education  2017 [employer]    Back Pain, Adult Many adults have back pain from time to time. Common causes of back pain include:  A strained muscle or ligament.  Wear and tear (degeneration) of the spinal disks.  Arthritis.  A hit to the back.  Back pain can be short-lived (acute) or last a long time (chronic). A physical exam, lab tests, and imaging studies may be done to find the cause of your pain. Follow these instructions at home: Managing pain and stiffness  Take over-the-counter and prescription medicines only as told by your health care provider.  If directed, apply heat to the affected area as often as told by your health care provider. Use the heat source that your health care provider recommends, such as a moist heat pack or a heating pad. ? Place a towel between your skin and the heat source. ? Leave the heat on for 20-30 minutes. ? Remove the heat if your skin turns bright red. This is especially important if you are unable to feel pain, heat, or cold. You have a greater risk of getting burned.  If directed, apply ice  to the injured area: ? Put ice in a plastic bag. ? Place a towel between your skin and the bag. ? Leave the ice on for 20 minutes, 2-3 times a day for the first 2-3 days. Activity  Do not stay in bed. Resting more than 1-2 days can delay your recovery.  Take short walks on even surfaces as soon as you are able. Try to increase the length of time you walk each day.  Do not sit, drive, or stand in one place for more than 30 minutes at a time. Sitting or standing for long periods of time can put stress on your back.  Use proper lifting techniques. When you bend and lift, use positions that put less stress on your back: ? Trail your knees. ? Keep  the load close to your body. ? Avoid twisting.  Exercise regularly as told by your health care provider. Exercising will help your back heal faster. This also helps prevent back injuries by keeping muscles strong and flexible.  Your health care provider may recommend that you see a physical therapist. This person can help you come up with a safe exercise program. Do any exercises as told by your physical therapist. Lifestyle  Maintain a healthy weight. Extra weight puts stress on your back and makes it difficult to have good posture.  Avoid activities or situations that make you feel anxious or stressed. Learn ways to manage anxiety and stress. One way to manage stress is through exercise. Stress and anxiety increase muscle tension and can make back pain worse. General instructions  Sleep on a firm mattress in a comfortable position. Try lying on your side with your knees slightly bent. If you lie on your back, put a pillow under your knees.  Follow your treatment plan as told by your health care provider. This may include: ? Cognitive or behavioral therapy. ? Acupuncture or massage therapy. ? Meditation or yoga. Contact a health care provider if:  You have pain that is not relieved with rest or medicine.  You have increasing pain going down into your legs or buttocks.  Your pain does not improve in 2 weeks.  You have pain at night.  You lose weight.  You have a fever or chills. Get help right away if:  You develop new bowel or bladder control problems.  You have unusual weakness or numbness in your arms or legs.  You develop nausea or vomiting.  You develop abdominal pain.  You feel faint. Summary  Many adults have back pain from time to time. A physical exam, lab tests, and imaging studies may be done to find the cause of your pain.  Use proper lifting techniques. When you bend and lift, use positions that put less stress on your back.  Take over-the-counter and  prescription medicines and apply heat or ice as directed by your health care provider. This information is not intended to replace advice given to you by your health care provider. Make sure you discuss any questions you have with your health care provider. Document Released: 08/02/2005 Document Revised: 09/06/2016 Document Reviewed: 09/06/2016 Elsevier Interactive Patient Education  2018 [employer].   Urinary Tract Infection, Adult A urinary tract infection (UTI) is an infection of any part of the urinary tract. The urinary tract includes the:  Kidneys.  Ureters.  Bladder.  Urethra.  These organs make, store, and get rid of pee (urine) in the body. Follow these instructions at home:  Take over-the-counter and prescription medicines  only as told by your doctor.  If you were prescribed an antibiotic medicine, take it as told by your doctor. Do not stop taking the antibiotic even if you start to feel better.  Avoid the following drinks: ? Alcohol. ? Caffeine. ? Tea. ? Carbonated drinks.  Drink enough fluid to keep your pee clear or pale yellow.  Keep all follow-up visits as told by your doctor. This is important.  Make sure to: ? Empty your bladder often and completely. Do not to hold pee for long periods of time. ? Empty your bladder before and after sex. ? Wipe from front to back after a bowel movement if you are female. Use each tissue one time when you wipe. Contact a doctor if:  You have back pain.  You have a fever.  You feel sick to your stomach (nauseous).  You throw up (vomit).  Your symptoms do not get better after 3 days.  Your symptoms go away and then come back. Get help right away if:  You have very bad back pain.  You have very bad lower belly (abdominal) pain.  You are throwing up and cannot keep down any medicines or water. This information is not intended to replace advice given to you by your health care provider. Make sure you discuss any  questions you have with your health care provider. Document Released: 01/19/2008 Document Revised: 01/08/2016 Document Reviewed: 06/23/2015 Elsevier Interactive Patient Education  [employer].   If you have lab work done today you will be contacted with your lab results within the next 2 weeks.  If you have not heard from [place] then please contact us. The fastest way to get your results is to register for My Chart.   IF you received an x-ray today, you will receive an invoice from [facility]. Please contact [facility] at [phone] with questions or concerns regarding your invoice.   IF you received labwork today, you will receive an invoice from [facility]. Please contact [facility] at [phone] with questions or concerns regarding your invoice.   Our billing staff will not be able to assist you with questions regarding bills from these companies.  You will be contacted with the lab results as soon as they are available. The fastest way to get your results is to activate your My Chart account. Instructions are located on the last page of this paperwork. If you have not heard from [place] regarding the results in 2 weeks, please contact this office.

## 2018-04-15 LAB — BASIC METABOLIC PANEL
BUN/Creatinine Ratio: 20 (ref 12–28)
BUN: 16 mg/dL (ref 8–27)
CO2: 24 mmol/L (ref 20–29)
CREATININE: 0.81 mg/dL (ref 0.57–1.00)
Calcium: 9.5 mg/dL (ref 8.7–10.3)
Chloride: 105 mmol/L (ref 96–106)
GFR calc Af Amer: 88 mL/min/{1.73_m2} (ref >59)
GFR calc non Af Amer: 76 mL/min/{1.73_m2} (ref >59)
Glucose: 89 mg/dL (ref 65–99)
Potassium: 3.9 mmol/L (ref 3.5–5.2)
Sodium: 145 mmol/L — ABNORMAL HIGH (ref 134–144)

## 2018-04-15 LAB — URINE CULTURE

## 2018-04-26 ENCOUNTER — Encounter: Payer: Self-pay | Admitting: Family Medicine

## 2018-04-26 ENCOUNTER — Encounter

## 2018-04-26 ENCOUNTER — Other Ambulatory Visit: Payer: Self-pay

## 2018-04-26 ENCOUNTER — Ambulatory Visit: Payer: PRIVATE HEALTH INSURANCE | Admitting: Family Medicine

## 2018-04-26 VITALS — BP 110/64 | HR 60 | Temp 98.3°F | Ht 62.0 in | Wt 147.0 lb

## 2018-04-26 DIAGNOSIS — F411 Generalized anxiety disorder: Secondary | ICD-10-CM | POA: Diagnosis not present

## 2018-04-26 DIAGNOSIS — F439 Reaction to severe stress, unspecified: Secondary | ICD-10-CM

## 2018-04-26 MED ORDER — HYDROXYZINE HCL 25 MG PO TABS: 12.5000 mg | ORAL_TABLET | Freq: Three times a day (TID) | ORAL | 0 refills | Status: DC | PRN

## 2018-04-26 MED ORDER — FLUOXETINE HCL 20 MG PO TABS: 40.0000 mg | ORAL_TABLET | Freq: Every day | ORAL | 1 refills | Status: DC

## 2018-04-26 NOTE — Progress Notes (Signed)
Subjective:  By signing my name below, I, [name], attest that this documentation has been prepared under the direction and in the presence of [name], MD Electronically Signed: Ladene Artist, ED Scribe 04/26/2018 at 10:56 AM.   Patient ID: [name], female    DOB: 03-Jun-1951, 67 y.o.   MRN: [id number]  Chief Complaint  Patient presents with  . medical leave for work    Anxiety/ stress at work   . Fatigue   HPI [name] is a 67 y.o. female who presents to Primary Care at [facility] to discuss anxiety and poss medical leave from work. Last seen 8/30 for unrelated concern, she sent an e-mail recently 8/28. Has been on Prozac prev for depression and anxiety. Planned for increasing Prozac to 40 mg qd given recent worsening of symptoms. She felt overwhelmed at work with increase in anxiety, thought to be due in part to recent health issues. Pt had stenting for proximal LAD blockage on 7/23. She has had klonopin to use prn. Handout given on stress/stress management last OV.  Pt has increased Prozac to 40 mg daily x 1 wk. States she only takes klonopin prn for difficulty sleeping but doesn't care for this as it causes nightmares. 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 States she used to enjoy her job and sing on the way to work but initially noticed a change ~3 yrs ago when her prev boss retired and Teacher, adult education took over. 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 States she is also having difficulty staying asleep, but no difficulty falling asleep. She feels like she tosses and turns all night and never really rests. Dr. Jaynee Eagles advised that she schedule a sleep study. Pt is considering leaving the job or taking time off but states she is feeling guilty about taking  time off. States it is very overwhelming as she feels like her boss is never satisfied. Reports "I've run off my track. 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 She has not met with a counselor or therapist yet but plans to. Pt has not been exercising regularly; plans to start cardiac program on 9/25. Pt would like to try hydroxyzine to see if that can "take the edge off" for about a wk and go out of work starting 9/19.  Depression screen [facility] 2/9 04/26/2018 04/14/2018 04/03/2018 01/24/2018 09/27/2017  Decreased Interest 0 1 0 0 0  Down, Depressed, Hopeless 0 1 0 0 0  PHQ - 2 Score 0 2 0 0 0  Altered sleeping - 0 - - -  Tired, decreased energy - 0 - - -  Change in appetite - 0 - - -  Feeling bad or failure about yourself  - 0 - - -  Trouble concentrating - 0 - - -  Moving slowly or fidgety/restless - 0 - - -  Suicidal thoughts - 0 - - -  PHQ-9 Score - 2 - - -    Patient Active Problem List   Diagnosis Date Noted  . RLS (restless legs syndrome) 04/05/2018  . Atypical chest pain 03/10/2018  . CAD in native artery 03/10/2018  . Elevated blood pressure reading 03/10/2018  . Palpitations 03/10/2018  . Chest pain 03/09/2018  . Hyperlipidemia 03/08/2018  .  Coronary artery disease involving native coronary artery of native heart with unstable angina pectoris ([place]) 03/07/2018  . Unstable angina ([place])   . Cough 01/02/2013  . Anxiety state 01/02/2013  . Breast cancer (DCIS), stage 0, Left, receptor +, dx 2012 09/08/2010   Past Medical History:  Diagnosis Date  . Allergy    "dust mite residue, mold, mildew, grapefruit, chili peppers, cat dander, some weeds" (03/07/2018)  . Anxiety   . Arthritis    "minor; right knee, left hip" (03/07/2018)  . Bruises easily   . Cancer of left breast ([place])    double mastectomy 01/26/2011  . Childhood asthma   . Contact lens/glasses fitting   . Coronary artery disease    a.  She underwent LHC 7/23 which revealed 95% stenosis of proximal LAD, managed  with PCI/DES.   . Depression   . Essential hypertension   . GERD (gastroesophageal reflux disease)    "at times" (03/07/2018)  . Heart murmur   . Hiatal hernia   . High cholesterol   . History of kidney stones    "lots; no OR" (03/07/2018)  . Osteoporosis   . Rectal bleeding    hemorroid  . Ringing in ears, bilateral    "since ~ 06/2017" (03/07/2018)  . Sinus drainage    Past Surgical History:  Procedure Laterality Date  . APPENDECTOMY    . BREAST LUMPECTOMY W/ NEEDLE LOCALIZATION Left 10/05/2010   lumpectomy  . BREAST SURGERY    . CORONARY STENT INTERVENTION N/A 03/07/2018   Procedure: CORONARY STENT INTERVENTION;  Surgeon: Troy Sine, MD;  Location: [facility];  Service: Cardiovascular;  Laterality: N/A;  . LEFT HEART CATH AND CORONARY ANGIOGRAPHY N/A 03/07/2018   Procedure: LEFT HEART CATH AND CORONARY ANGIOGRAPHY;  Surgeon: Troy Sine, MD;  Location: [facility];  Service: Cardiovascular;  Laterality: N/A;  . MASTECTOMY Right 01/26/2011  . MASTECTOMY COMPLETE / SIMPLE W/ SENTINEL NODE BIOPSY Left 01/26/2011  . OVARIAN CYST SURGERY  1997, 2001, 2008   Allergies  Allergen Reactions  . Elavil [Amitriptyline Hcl] Rash  . Sulfur Rash   Prior to Admission medications   Medication Sig Start Date End Date Taking? Authorizing Provider  amLODipine (NORVASC) 5 MG tablet Take 1 tablet (5 mg total) by mouth daily. 03/11/18  Yes Dunn, Nedra Hai, PA-C  aspirin EC 81 MG tablet Take 1 tablet (81 mg total) by mouth daily. 02/27/18  Yes Troy Sine, MD  atorvastatin (LIPITOR) 80 MG tablet Take 1 tablet (80 mg total) by mouth daily at 6 PM. 03/08/18  Yes Kroeger, Lorelee Cover., PA-C  cetirizine (ZYRTEC) 10 MG tablet Take 10 mg by mouth daily as needed for allergies.    Yes [provider]  clonazePAM (KLONOPIN) 0.5 MG tablet Take 1 tablet (0.5 mg total) by mouth 2 (two) times daily as needed. Patient taking differently: Take 0.5 mg by mouth daily as needed for  anxiety.  01/24/18  Yes [name], MD  FLUoxetine (PROZAC) 20 MG tablet Take 1 tablet (20 mg total) by mouth daily. Patient taking differently: Take 40 mg by mouth daily.  09/15/17  Yes [name], MD  fluticasone (FLONASE) 50 MCG/ACT nasal spray Place 2 sprays into both nostrils daily. 06/17/16  Yes [name], MD  metoprolol succinate (TOPROL XL) 25 MG 24 hr tablet Take 0.5 tablets (12.5 mg total) by mouth daily. 03/10/18  Yes Dunn, Dayna N, PA-C  nitroGLYCERIN (NITROSTAT) 0.4 MG SL tablet  Place 1 tablet (0.4 mg total) under the tongue every 5 (five) minutes as needed for chest pain. 02/27/18 05/28/18 Yes Troy Sine, MD  ticagrelor (BRILINTA) 90 MG TABS tablet Take 1 tablet (90 mg total) by mouth 2 (two) times daily. 03/08/18  Yes Kroeger, Lorelee Cover., PA-C  HYDROcodone-acetaminophen (NORCO/VICODIN) 5-325 MG tablet Take 1 tablet by mouth every 6 (six) hours as needed for moderate pain. Patient not taking: Reported on 04/26/2018 04/14/18   [name], MD  meclizine (ANTIVERT) 25 MG tablet Take 1 tablet (25 mg total) by mouth 3 (three) times daily as needed for dizziness. Patient not taking: Reported on 04/26/2018 01/24/18   [name], MD  ondansetron (ZOFRAN ODT) 4 MG disintegrating tablet Take 1 tablet (4 mg total) by mouth every 8 (eight) hours as needed for nausea or vomiting. Patient not taking: Reported on 04/26/2018 04/14/18   [name], MD  Rotigotine (NEUPRO) 1 MG/24HR PT24 Place 1 patch (1 mg total) onto the skin at bedtime as needed. May remove in the morning. Patient not taking: Reported on 04/26/2018 04/05/18   Melvenia Beam, MD   Social History   Socioeconomic History  . Marital status: Divorced    Spouse name: Not on file  . Number of children: Not on file  . Years of education: Not on file  . Highest education level: Bachelor's degree (e.g., BA, AB, BS)  Occupational History  . Not on file  Social Needs  . Financial resource strain: Not  on file  . Food insecurity:    Worry: Not on file    Inability: Not on file  . Transportation needs:    Medical: Not on file    Non-medical: Not on file  Tobacco Use  . Smoking status: Never Smoker  . Smokeless tobacco: Never Used  Substance and Sexual Activity  . Alcohol use: Yes    Comment: 03/07/2018 "probably once/month"  . Drug use: No  . Sexual activity: Not on file  Lifestyle  . Physical activity:    Days per week: Not on file    Minutes per session: Not on file  . Stress: Not on file  Relationships  . Social connections:    Talks on phone: Not on file    Gets together: Not on file    Attends religious service: Not on file    Active member of club or organization: Not on file    Attends meetings of clubs or organizations: Not on file    Relationship status: Not on file  . Intimate partner violence:    Fear of current or ex partner: Not on file    Emotionally abused: Not on file    Physically abused: Not on file    Forced sexual activity: Not on file  Other Topics Concern  . Not on file  Social History Narrative   Lives at home alone   Right handed   Caffeine: 1/2 cup of coffee daily that has (1/2 & 1/2 caf)   Review of Systems  Psychiatric/Behavioral: Positive for sleep disturbance. The patient is nervous/anxious.       Objective:   Physical Exam  Constitutional: She is oriented to person, place, and time. She appears well-developed and well-nourished. No distress.  HENT:  Head: Normocephalic and atraumatic.  Eyes: Conjunctivae and EOM are normal.  Neck: Neck supple. No tracheal deviation present. No thyromegaly present.  Cardiovascular: Normal rate and regular rhythm.  No murmur heard. Pulmonary/Chest: Effort normal. No respiratory  distress.  Musculoskeletal: Normal range of motion.  Neurological: She is alert and oriented to person, place, and time.  Skin: Skin is warm and dry.  Psychiatric: She has a normal mood and affect. Her behavior is normal.    Nursing note and vitals reviewed.  Vitals:   04/26/18 1007  BP: 110/64  Pulse: 60  Temp: 98.3 F (36.8 C)  TempSrc: Oral  SpO2: 98%  Weight: 147 lb (66.7 kg)  Height: 5' 2"  (1.575 m)  Over 25 mins of face-to-face care with greater than 50% counseling.   Assessment & Plan:   [name] is a 67 y.o. female Anxiety state - Plan: hydrOXYzine (ATARAX/VISTARIL) 25 MG tablet, FLUoxetine (PROZAC) 20 MG tablet  Situational stress  History of anxiety, but with increased situational stressors, work stressors has caused significant impact recently.  - Did recently change medications, and suspect that will be helpful in time but also think that some time off work and meeting with therapist through [facility] program will be also beneficial.  Plan for start of 2 weeks off initially starting September 19, and reassess at that time to determine if further time needed.   -Trial of hydroxyzine in place of benzodiazepine  -Still would recommend meeting with sleep specialist to look at other potential contributors to insomnia  -We will complete FMLA paperwork, work note, recheck 2 weeks.  Sooner if worsening symptoms.  Meds ordered this encounter  Medications  . hydrOXYzine (ATARAX/VISTARIL) 25 MG tablet    Sig: Take 0.5-1 tablets (12.5-25 mg total) by mouth 3 (three) times daily as needed for anxiety.    Dispense:  30 tablet    Refill:  0  . FLUoxetine (PROZAC) 20 MG tablet    Sig: Take 2 tablets (40 mg total) by mouth daily.    Dispense:  180 tablet    Refill:  1   Patient Instructions   Higher dose of Prozac may be helpful, but may take a little more time to work.  Ok to try hydroxyzine in place of clonazepam for anxiety or difficulty getting to sleep.   I would like you to meet with therapist during this time, especially during this time of being out of work. Please call therapist at [facility] program to schedule appt as soon as possible.   Follow up with sleep specialist as there may other  contributors to your sleep.   We can initially try to see if 2 weeks out with recheck at that time to determine return time or part time option initially.  Start time of leave can be on September 19, but if you are having more difficulty between now and then, we may need to start that sooner. Follow-up with me before that leave has expired.  Let me know if there are questions in the meantime.    If you have lab work done today you will be contacted with your lab results within the next 2 weeks.  If you have not heard from [place] then please contact us. The fastest way to get your results is to register for My Chart.   IF you received an x-ray today, you will receive an invoice from [facility]. Please contact [facility] at [phone] with questions or concerns regarding your invoice.   IF you received labwork today, you will receive an invoice from [place]. Please contact [facility] at [phone] with questions or concerns regarding your invoice.   Our billing staff will not be able to assist you with questions regarding  bills from these companies.  You will be contacted with the lab results as soon as they are available. The fastest way to get your results is to activate your My Chart account. Instructions are located on the last page of this paperwork. If you have not heard from [place] regarding the results in 2 weeks, please contact this office.       I personally performed the services described in this documentation, which was scribed in my presence. The recorded information has been reviewed and considered for accuracy and completeness, addended by me as needed, and agree with information above.  Signed,   [name], MD Primary [facility] at [facility].  04/30/18 9:57 PM

## 2018-04-26 NOTE — Patient Instructions (Addendum)
Higher dose of Prozac may be helpful, but may take a little more time to work.  Ok to try hydroxyzine in place of clonazepam for anxiety or difficulty getting to sleep.   I would like you to meet with therapist during this time, especially during this time of being out of work. Please call therapist at [facility] to schedule appt as soon as possible.   Follow up with sleep specialist as there may other contributors to your sleep.   We can initially try to see if 2 weeks out with recheck at that time to determine return time or part time option initially.  Start time of leave can be on September 19, but if you are having more difficulty between now and then, we may need to start that sooner. Follow-up with me before that leave has expired.  Let me know if there are questions in the meantime.    If you have lab work done today you will be contacted with your lab results within the next 2 weeks.  If you have not heard from [place] then please contact us. The fastest way to get your results is to register for My Chart.   IF you received an x-ray today, you will receive an invoice from [facility]. Please contact [facility] at [phone] with questions or concerns regarding your invoice.   IF you received labwork today, you will receive an invoice from [place]. Please contact [facility] at [phone] with questions or concerns regarding your invoice.   Our billing staff will not be able to assist you with questions regarding bills from these companies.  You will be contacted with the lab results as soon as they are available. The fastest way to get your results is to activate your My Chart account. Instructions are located on the last page of this paperwork. If you have not heard from [place] regarding the results in 2 weeks, please contact this office.

## 2018-04-27 ENCOUNTER — Telehealth: Payer: Self-pay | Admitting: Family Medicine

## 2018-04-27 NOTE — Telephone Encounter (Signed)
Patient needs FMLA and short term disability forms completed for her last OV on anxiety. I could not complete the forms because the OV note had not been completed. I will place the blank forms in Dr Vonna Kotyk box on 04/27/18 please complete and return to the FMLA/Disability box at the [employer] checkout desk within 5-7 business days. Thank you!

## 2018-04-28 NOTE — Progress Notes (Signed)
RAMIA SIDNEY 67 y.o. female DOB 05/26/1951 MRN [id number]       Nutrition  No diagnosis found. Past Medical History:  Diagnosis Date  . Allergy    "dust mite residue, mold, mildew, grapefruit, chili peppers, cat dander, some weeds" (03/07/2018)  . Anxiety   . Arthritis    "minor; right knee, left hip" (03/07/2018)  . Bruises easily   . Cancer of left breast ([place])    double mastectomy 01/26/2011  . Childhood asthma   . Contact lens/glasses fitting   . Coronary artery disease    a.  She underwent LHC 7/23 which revealed 95% stenosis of proximal LAD, managed with PCI/DES.   . Depression   . Essential hypertension   . GERD (gastroesophageal reflux disease)    "at times" (03/07/2018)  . Heart murmur   . Hiatal hernia   . High cholesterol   . History of kidney stones    "lots; no OR" (03/07/2018)  . Osteoporosis   . Rectal bleeding    hemorroid  . Ringing in ears, bilateral    "since ~ 06/2017" (03/07/2018)  . Sinus drainage    Meds reviewed.     Current Outpatient Medications (Cardiovascular):  .  amLODipine (NORVASC) 5 MG tablet, Take 1 tablet (5 mg total) by mouth daily. Marland Kitchen  atorvastatin (LIPITOR) 80 MG tablet, Take 1 tablet (80 mg total) by mouth daily at 6 PM. .  metoprolol succinate (TOPROL XL) 25 MG 24 hr tablet, Take 0.5 tablets (12.5 mg total) by mouth daily. .  nitroGLYCERIN (NITROSTAT) 0.4 MG SL tablet, Place 1 tablet (0.4 mg total) under the tongue every 5 (five) minutes as needed for chest pain.  Current Outpatient Medications (Respiratory):  .  cetirizine (ZYRTEC) 10 MG tablet, Take 10 mg by mouth daily as needed for allergies.  .  fluticasone (FLONASE) 50 MCG/ACT nasal spray, Place 2 sprays into both nostrils daily.  Current Outpatient Medications (Analgesics):  .  aspirin EC 81 MG tablet, Take 1 tablet (81 mg total) by mouth daily. Marland Kitchen  HYDROcodone-acetaminophen (NORCO/VICODIN) 5-325 MG tablet, Take 1 tablet by mouth every 6 (six) hours as needed for moderate  pain. (Patient not taking: Reported on 04/26/2018)  Current Outpatient Medications (Hematological):  .  ticagrelor (BRILINTA) 90 MG TABS tablet, Take 1 tablet (90 mg total) by mouth 2 (two) times daily.  Current Outpatient Medications (Other):  .  clonazePAM (KLONOPIN) 0.5 MG tablet, Take 1 tablet (0.5 mg total) by mouth 2 (two) times daily as needed. (Patient taking differently: Take 0.5 mg by mouth daily as needed for anxiety. ) .  FLUoxetine (PROZAC) 20 MG tablet, Take 2 tablets (40 mg total) by mouth daily. .  hydrOXYzine (ATARAX/VISTARIL) 25 MG tablet, Take 0.5-1 tablets (12.5-25 mg total) by mouth 3 (three) times daily as needed for anxiety. .  meclizine (ANTIVERT) 25 MG tablet, Take 1 tablet (25 mg total) by mouth 3 (three) times daily as needed for dizziness. (Patient not taking: Reported on 04/26/2018) .  ondansetron (ZOFRAN ODT) 4 MG disintegrating tablet, Take 1 tablet (4 mg total) by mouth every 8 (eight) hours as needed for nausea or vomiting. (Patient not taking: Reported on 04/26/2018) .  Rotigotine (NEUPRO) 1 MG/24HR PT24, Place 1 patch (1 mg total) onto the skin at bedtime as needed. May remove in the morning. (Patient not taking: Reported on 04/26/2018)   HT: Ht Readings from Last 1 Encounters:  04/26/18 5\' 2"  (1.575 m)    WT: Wt Readings from Last  5 Encounters:  04/26/18 147 lb (66.7 kg)  04/14/18 150 lb 9.6 oz (68.3 kg)  04/05/18 149 lb (67.6 kg)  04/03/18 148 lb 9.6 oz (67.4 kg)  03/21/18 146 lb (66.2 kg)     BMI 26.88 (04/26/18)  Current tobacco use? No       Labs:  Lipid Panel     Component Value Date/Time   CHOL 142 03/06/2018 0913   TRIG 153 (H) 03/06/2018 0913   HDL 39 (L) 03/06/2018 0913   CHOLHDL 3.6 03/06/2018 0913   CHOLHDL 3.9 01/17/2016 0937   VLDL 37 (H) 01/17/2016 0937   LDLCALC 72 03/06/2018 0913    Lab Results  Component Value Date   HGBA1C 5.7 (H) 09/24/2017   CBG (last 3)  No results for input(s): GLUCAP in the last 72  hours.  Nutrition Diagnosis ? Food-and nutrition-related knowledge deficit related to lack of exposure to information as related to diagnosis of: ? CVD ? Overweight related to excessive energy intake as evidenced by a BMI of 26.88  Nutrition Goal(s):  ? To be determined  Plan:  Pt to attend nutrition classes ? Nutrition I ? Nutrition II ? Portion Distortion  ? Diabetes Blitz ? Diabetes Q & A Will provide client-centered nutrition education as part of interdisciplinary care.   Monitor and evaluate progress toward nutrition goal with team.  Laurina Bustle, MS, RD, LDN 04/28/2018 1:31 PM

## 2018-05-01 ENCOUNTER — Telehealth (HOSPITAL_COMMUNITY): Payer: Self-pay | Admitting: Pharmacist

## 2018-05-01 NOTE — Telephone Encounter (Signed)
Forms completed

## 2018-05-04 ENCOUNTER — Encounter (HOSPITAL_COMMUNITY)
Admission: RE | Admit: 2018-05-04 | Discharge: 2018-05-04 | Disposition: A | Discharge status: Home / Self Care | Payer: PRIVATE HEALTH INSURANCE | Source: Ambulatory Visit | Attending: Cardiovascular Disease | Admitting: Cardiovascular Disease

## 2018-05-04 ENCOUNTER — Encounter (HOSPITAL_COMMUNITY): Payer: Self-pay

## 2018-05-04 VITALS — Ht 62.0 in | Wt 146.8 lb

## 2018-05-04 DIAGNOSIS — M810 Age-related osteoporosis without current pathological fracture: Secondary | ICD-10-CM | POA: Insufficient documentation

## 2018-05-04 DIAGNOSIS — E785 Hyperlipidemia, unspecified: Secondary | ICD-10-CM | POA: Insufficient documentation

## 2018-05-04 DIAGNOSIS — I2510 Atherosclerotic heart disease of native coronary artery without angina pectoris: Secondary | ICD-10-CM | POA: Insufficient documentation

## 2018-05-04 DIAGNOSIS — Z955 Presence of coronary angioplasty implant and graft: Secondary | ICD-10-CM | POA: Insufficient documentation

## 2018-05-04 DIAGNOSIS — Z853 Personal history of malignant neoplasm of breast: Secondary | ICD-10-CM | POA: Insufficient documentation

## 2018-05-04 DIAGNOSIS — K219 Gastro-esophageal reflux disease without esophagitis: Secondary | ICD-10-CM | POA: Insufficient documentation

## 2018-05-04 DIAGNOSIS — F329 Major depressive disorder, single episode, unspecified: Secondary | ICD-10-CM | POA: Insufficient documentation

## 2018-05-04 DIAGNOSIS — Z79899 Other long term (current) drug therapy: Secondary | ICD-10-CM | POA: Insufficient documentation

## 2018-05-04 DIAGNOSIS — I10 Essential (primary) hypertension: Secondary | ICD-10-CM | POA: Insufficient documentation

## 2018-05-04 DIAGNOSIS — Z7982 Long term (current) use of aspirin: Secondary | ICD-10-CM | POA: Insufficient documentation

## 2018-05-04 NOTE — Progress Notes (Signed)
[name] 67 y.o. female DOB: [birth year] MRN: [id number]      Nutrition Note  1. 07/23/2019Stented coronary artery    Past Medical History:  Diagnosis Date  . Allergy    "dust mite residue, mold, mildew, grapefruit, chili peppers, cat dander, some weeds" (03/07/2018)  . Anxiety   . Arthritis    "minor; right knee, left hip" (03/07/2018)  . Bruises easily   . Cancer of left breast ([place])    double mastectomy 01/26/2011  . Childhood asthma   . Contact lens/glasses fitting   . Coronary artery disease    a.  She underwent LHC 7/23 which revealed 95% stenosis of proximal LAD, managed with PCI/DES.   . Depression   . Essential hypertension   . GERD (gastroesophageal reflux disease)    "at times" (03/07/2018)  . Heart murmur   . Hiatal hernia   . High cholesterol   . History of kidney stones    "lots; no OR" (03/07/2018)  . Osteoporosis   . Rectal bleeding    hemorroid  . Ringing in ears, bilateral    "since ~ 06/2017" (03/07/2018)  . Sinus drainage    Meds reviewed.    Current Outpatient Medications (Cardiovascular):  .  amLODipine (NORVASC) 5 MG tablet, Take 1 tablet (5 mg total) by mouth daily. Marland Kitchen  atorvastatin (LIPITOR) 80 MG tablet, Take 1 tablet (80 mg total) by mouth daily at 6 PM. .  metoprolol succinate (TOPROL XL) 25 MG 24 hr tablet, Take 0.5 tablets (12.5 mg total) by mouth daily. .  nitroGLYCERIN (NITROSTAT) 0.4 MG SL tablet, Place 1 tablet (0.4 mg total) under the tongue every 5 (five) minutes as needed for chest pain.  Current Outpatient Medications (Respiratory):  .  cetirizine (ZYRTEC) 10 MG tablet, Take 10 mg by mouth daily as needed for allergies.  .  fluticasone (FLONASE) 50 MCG/ACT nasal spray, Place 2 sprays into both nostrils daily.  Current Outpatient Medications (Analgesics):  .  aspirin EC 81 MG tablet, Take 1 tablet (81 mg total) by mouth daily. Marland Kitchen  HYDROcodone-acetaminophen (NORCO/VICODIN) 5-325 MG tablet, Take 1 tablet by mouth every 6 (six) hours  as needed for moderate pain. (Patient not taking: Reported on 04/26/2018)  Current Outpatient Medications (Hematological):  .  ticagrelor (BRILINTA) 90 MG TABS tablet, Take 1 tablet (90 mg total) by mouth 2 (two) times daily.  Current Outpatient Medications (Other):  .  clonazePAM (KLONOPIN) 0.5 MG tablet, Take 1 tablet (0.5 mg total) by mouth 2 (two) times daily as needed. (Patient taking differently: Take 0.5 mg by mouth daily as needed for anxiety. ) .  FLUoxetine (PROZAC) 20 MG tablet, Take 2 tablets (40 mg total) by mouth daily. .  hydrOXYzine (ATARAX/VISTARIL) 25 MG tablet, Take 0.5-1 tablets (12.5-25 mg total) by mouth 3 (three) times daily as needed for anxiety. .  meclizine (ANTIVERT) 25 MG tablet, Take 1 tablet (25 mg total) by mouth 3 (three) times daily as needed for dizziness. (Patient not taking: Reported on 04/26/2018) .  ondansetron (ZOFRAN ODT) 4 MG disintegrating tablet, Take 1 tablet (4 mg total) by mouth every 8 (eight) hours as needed for nausea or vomiting. (Patient not taking: Reported on 04/26/2018) .  Rotigotine (NEUPRO) 1 MG/24HR PT24, Place 1 patch (1 mg total) onto the skin at bedtime as needed. May remove in the morning. (Patient not taking: Reported on 04/26/2018)   HT: Ht Readings from Last 1 Encounters:  04/26/18 5\' 2"  (1.575 m)    WT: Wt  Readings from Last 5 Encounters:  04/26/18 147 lb (66.7 kg)  04/14/18 150 lb 9.6 oz (68.3 kg)  04/05/18 149 lb (67.6 kg)  04/03/18 148 lb 9.6 oz (67.4 kg)  03/21/18 146 lb (66.2 kg)     There is no height or weight on file to calculate BMI.   Current tobacco use? No  Labs:  Lipid Panel     Component Value Date/Time   CHOL 142 03/06/2018 0913   TRIG 153 (H) 03/06/2018 0913   HDL 39 (L) 03/06/2018 0913   CHOLHDL 3.6 03/06/2018 0913   CHOLHDL 3.9 01/17/2016 0937   VLDL 37 (H) 01/17/2016 0937   LDLCALC 72 03/06/2018 0913    Lab Results  Component Value Date   HGBA1C 5.7 (H) 09/24/2017   CBG (last 3)  No results  for input(s): GLUCAP in the last 72 hours.  Nutrition Note Spoke with pt. Nutrition plan and goals reviewed with pt. Pt is following Step 2 of the Therapeutic Lifestyle Changes diet. Pt wants to lose wt, 20 lbs, last time patient weighed 127 was 10 years ago. Pt has not been trying to lose wt. Wt loss tips reviewed (label reading, how to build a healthy plate, portion sizes, eating frequently across the day). Recommend pt decrease higher fat cuts of meat, sugar sweetened beverages (sweet tea, soda), increase complex carbs over refined carbohydrates, and lower sodium intake by decreasing convenience foods. Discussed using healthy oils to cook with at meals instead of butter. Pt may be pre-diabetic, last A1c indicates blood glucose elevated at 5.7. Recommended pt swap out refined carbohydrates for complex carbohydrates. Educated pt on complex vs simple carbohydrates, and distributed label reading handout. Pt with dx of CHF. Per discussion, pt does not use canned/convenience foods often. Pt rarely adds salt to food. Pt eats out infrequently. Pt expressed understanding of the information reviewed. Pt aware of nutrition education classes offered and plans on attending nutrition classes.  Nutrition Diagnosis ? Food-and nutrition-related knowledge deficit related to lack of exposure to information as related to diagnosis of: ? CVD ? Pre-DM  Nutrition Intervention ? Pt's individual nutrition plan and goals reviewed with pt.   Nutrition Goal(s):   ? Pt to identify and limit food sources of saturated fat, trans fat, refined carbohydrates and sodium ? Pt to identify food quantities necessary to achieve weight loss of 6-24 lbs. at graduation from cardiac rehab. Goal wt of 127 lb desired.  ? Pt able to name foods that affect blood glucose.   Plan:  ? Pt to attend nutrition classes ? Nutrition I ? Nutrition II ? Portion Distortion  ? Diabetes Blitz ? Diabetes Q & Ae determined ? Will provide client-centered  nutrition education as part of interdisciplinary care ? Monitor and evaluate progress toward nutrition goal with team.   Laurina Bustle, MS, RD, LDN 05/04/2018 8:14 AM

## 2018-05-04 NOTE — Progress Notes (Signed)
Cardiac Rehab Medication Review by a RN  Does the patient  feel that his/her medications are working for him/her?  yes  Has the patient been experiencing any side effects to the medications prescribed?  no  Does the patient measure his/her own blood pressure or blood glucose at home?  no   Does the patient have any problems obtaining medications due to transportation or finances?   no  Understanding of regimen: good Understanding of indications: good Potential of compliance: good    RN comments: N/A    [name] 05/04/2018 8:21 AM

## 2018-05-04 NOTE — Progress Notes (Signed)
Cardiac Individual Treatment Plan  Patient Details  Name: [name] MRN: [id number] Date of Birth: 1951-02-03 Referring Provider:     CARDIAC REHAB PHASE II ORIENTATION from 05/04/2018 in [facility]  Referring Provider  Troy Sine MD       Initial Encounter Date:    CARDIAC REHAB PHASE II ORIENTATION from 05/04/2018 in [facility]  Date  05/04/18      Visit Diagnosis: 07/23/2019Stented coronary artery  Patient's Home Medications on Admission:  Current Outpatient Medications:  .  amLODipine (NORVASC) 5 MG tablet, Take 1 tablet (5 mg total) by mouth daily., Disp: 30 tablet, Rfl: 6 .  aspirin EC 81 MG tablet, Take 1 tablet (81 mg total) by mouth daily., Disp: 90 tablet, Rfl: 3 .  atorvastatin (LIPITOR) 80 MG tablet, Take 1 tablet (80 mg total) by mouth daily at 6 PM., Disp: 90 tablet, Rfl: 3 .  cetirizine (ZYRTEC) 10 MG tablet, Take 10 mg by mouth daily as needed for allergies. , Disp: , Rfl:  .  FLUoxetine (PROZAC) 20 MG tablet, Take 2 tablets (40 mg total) by mouth daily., Disp: 180 tablet, Rfl: 1 .  fluticasone (FLONASE) 50 MCG/ACT nasal spray, Place 2 sprays into both nostrils daily., Disp: 16 g, Rfl: 6 .  hydrOXYzine (ATARAX/VISTARIL) 25 MG tablet, Take 0.5-1 tablets (12.5-25 mg total) by mouth 3 (three) times daily as needed for anxiety., Disp: 30 tablet, Rfl: 0 .  metoprolol succinate (TOPROL XL) 25 MG 24 hr tablet, Take 0.5 tablets (12.5 mg total) by mouth daily., Disp: 30 tablet, Rfl: 6 .  nitroGLYCERIN (NITROSTAT) 0.4 MG SL tablet, Place 1 tablet (0.4 mg total) under the tongue every 5 (five) minutes as needed for chest pain., Disp: 25 tablet, Rfl: 3 .  ticagrelor (BRILINTA) 90 MG TABS tablet, Take 1 tablet (90 mg total) by mouth 2 (two) times daily., Disp: 180 tablet, Rfl: 3 .  clonazePAM (KLONOPIN) 0.5 MG tablet, Take 1 tablet (0.5 mg total) by mouth 2 (two) times daily as needed. (Patient not taking:  Reported on 05/04/2018), Disp: 30 tablet, Rfl: 1 .  HYDROcodone-acetaminophen (NORCO/VICODIN) 5-325 MG tablet, Take 1 tablet by mouth every 6 (six) hours as needed for moderate pain. (Patient not taking: Reported on 04/26/2018), Disp: 15 tablet, Rfl: 0 .  meclizine (ANTIVERT) 25 MG tablet, Take 1 tablet (25 mg total) by mouth 3 (three) times daily as needed for dizziness. (Patient not taking: Reported on 04/26/2018), Disp: 30 tablet, Rfl: 0 .  ondansetron (ZOFRAN ODT) 4 MG disintegrating tablet, Take 1 tablet (4 mg total) by mouth every 8 (eight) hours as needed for nausea or vomiting. (Patient not taking: Reported on 04/26/2018), Disp: 10 tablet, Rfl: 0 .  Rotigotine (NEUPRO) 1 MG/24HR PT24, Place 1 patch (1 mg total) onto the skin at bedtime as needed. May remove in the morning. (Patient not taking: Reported on 04/26/2018), Disp: 30 patch, Rfl: 0  Past Medical History: Past Medical History:  Diagnosis Date  . Allergy    "dust mite residue, mold, mildew, grapefruit, chili peppers, cat dander, some weeds" (03/07/2018)  . Anxiety   . Arthritis    "minor; right knee, left hip" (03/07/2018)  . Bruises easily   . Cancer of left breast ([place])    double mastectomy 01/26/2011  . Childhood asthma   . Contact lens/glasses fitting   . Coronary artery disease    a.  She underwent LHC 7/23 which revealed 95% stenosis  of proximal LAD, managed with PCI/DES.   . Depression   . Essential hypertension   . GERD (gastroesophageal reflux disease)    "at times" (03/07/2018)  . Heart murmur   . Hiatal hernia   . High cholesterol   . History of kidney stones    "lots; no OR" (03/07/2018)  . Osteoporosis   . Rectal bleeding    hemorroid  . Ringing in ears, bilateral    "since ~ 06/2017" (03/07/2018)  . Sinus drainage     Tobacco Use: Social History   Tobacco Use  Smoking Status Never Smoker  Smokeless Tobacco Never Used    Labs: Recent Review Flowsheet Data    Labs for ITP Cardiac and Pulmonary Rehab  Latest Ref Rng & Units 05/21/2015 01/17/2016 07/22/2016 09/24/2017 03/06/2018   Cholestrol 100 - 199 mg/dL 247(H) 163 201(H) 249(H) 142   LDLCALC 0 - 99 mg/dL 147(H) 84 126(H) 174(H) 72   HDL >39 mg/dL 34(L) 42(L) 46 45 39(L)   Trlycerides 0 - 149 mg/dL 329(H) 184(H) 143 148 153(H)   Hemoglobin A1c 4.8 - 5.6 % 5.9(H) - 5.6 5.7(H) -   TCO2 0 - 100 mmol/L - - - - -      Capillary Blood Glucose: No results found for: GLUCAP   Exercise Target Goals: Exercise Program Goal: Individual exercise prescription set using results from initial 6 min walk test and THRR while considering  patient's activity barriers and safety.   Exercise Prescription Goal: Initial exercise prescription builds to 30-45 minutes a day of aerobic activity, 2-3 days per week.  Home exercise guidelines will be given to patient during program as part of exercise prescription that the participant will acknowledge.  Activity Barriers & Risk Stratification: Activity Barriers & Cardiac Risk Stratification - 05/04/18 1103      Activity Barriers & Cardiac Risk Stratification   Activity Barriers  Deconditioning    Cardiac Risk Stratification  Moderate       6 Minute Walk: 6 Minute Walk    Row Name 05/04/18 1101         6 Minute Walk   Phase  Initial     Distance  1600 feet     Walk Time  6 minutes     # of Rest Breaks  0     MPH  3.03     METS  3.39     RPE  12     Perceived Dyspnea   0     VO2 Peak  11.87     Symptoms  No     Resting HR  67 bpm     Resting BP  108/60     Resting Oxygen Saturation   98 %     Exercise Oxygen Saturation  during 6 min walk  98 %     Max Ex. HR  90 bpm     Max Ex. BP  118/60     2 Minute Post BP  98/60        Oxygen Initial Assessment:   Oxygen Re-Evaluation:   Oxygen Discharge (Final Oxygen Re-Evaluation):   Initial Exercise Prescription: Initial Exercise Prescription - 05/04/18 1100      Date of Initial Exercise RX and Referring Provider   Date  05/04/18     Referring Provider  Troy Sine MD     Expected Discharge Date  08/02/18      Recumbant Bike   Level  1.5    Watts  25  Minutes  10    METs  3.21      NuStep   Level  2    SPM  75    Minutes  10    METs  3      Track   Laps  12    Minutes  10    METs  3.07      Prescription Details   Frequency (times per week)  3x    Duration  Progress to 30 minutes of continuous aerobic without signs/symptoms of physical distress      Intensity   THRR 40-80% of Max Heartrate  62-123    Ratings of Perceived Exertion  11-13    Perceived Dyspnea  0-4      Progression   Progression  Continue progressive overload as per policy without signs/symptoms or physical distress.      Resistance Training   Training Prescription  Yes    Weight  3lbs    Reps  10-15       Perform Capillary Blood Glucose checks as needed.  Exercise Prescription Changes:   Exercise Comments:   Exercise Goals and Review: Exercise Goals    Row Name 05/04/18 1103             Exercise Goals   Increase Physical Activity  Yes       Intervention  Provide advice, education, support and counseling about physical activity/exercise needs.;Develop an individualized exercise prescription for aerobic and resistive training based on initial evaluation findings, risk stratification, comorbidities and participant's personal goals.       Expected Outcomes  Short Term: Attend rehab on a regular basis to increase amount of physical activity.;Long Term: Exercising regularly at least 3-5 days a week.;Long Term: Add in home exercise to make exercise part of routine and to increase amount of physical activity.       Increase Strength and Stamina  Yes       Intervention  Provide advice, education, support and counseling about physical activity/exercise needs.;Develop an individualized exercise prescription for aerobic and resistive training based on initial evaluation findings, risk stratification, comorbidities and  participant's personal goals.       Expected Outcomes  Short Term: Increase workloads from initial exercise prescription for resistance, speed, and METs.;Short Term: Perform resistance training exercises routinely during rehab and add in resistance training at home;Long Term: Improve cardiorespiratory fitness, muscular endurance and strength as measured by increased METs and functional capacity (6MWT)       Able to understand and use rate of perceived exertion (RPE) scale  Yes       Intervention  Provide education and explanation on how to use RPE scale       Expected Outcomes  Short Term: Able to use RPE daily in rehab to express subjective intensity level;Long Term:  Able to use RPE to guide intensity level when exercising independently       Knowledge and understanding of Target Heart Rate Range (THRR)  Yes       Intervention  Provide education and explanation of THRR including how the numbers were predicted and where they are located for reference       Expected Outcomes  Short Term: Able to state/look up THRR;Long Term: Able to use THRR to govern intensity when exercising independently;Short Term: Able to use daily as guideline for intensity in rehab       Able to check pulse independently  Yes       Intervention  Provide education  and demonstration on how to check pulse in carotid and radial arteries.;Review the importance of being able to check your own pulse for safety during independent exercise       Expected Outcomes  Short Term: Able to explain why pulse checking is important during independent exercise;Long Term: Able to check pulse independently and accurately       Understanding of Exercise Prescription  Yes       Intervention  Provide education, explanation, and written materials on patient's individual exercise prescription       Expected Outcomes  Short Term: Able to explain program exercise prescription;Long Term: Able to explain home exercise prescription to exercise independently           Exercise Goals Re-Evaluation :   Discharge Exercise Prescription (Final Exercise Prescription Changes):   Nutrition:  Target Goals: Understanding of nutrition guidelines, daily intake of sodium 1500mg , cholesterol 200mg , calories 30% from fat and 7% or less from saturated fats, daily to have 5 or more servings of fruits and vegetables.  Biometrics: Pre Biometrics - 05/04/18 1102      Pre Biometrics   Height  5\' 2"  (1.575 m)    Weight  66.6 kg    Waist Circumference  35 inches    Hip Circumference  40 inches    Waist to Hip Ratio  0.88 %    BMI (Calculated)  26.85    Triceps Skinfold  28 mm    % Body Fat  38.7 %    Grip Strength  30 kg    Flexibility  15 in    Single Leg Stand  30 seconds        Nutrition Therapy Plan and Nutrition Goals: Nutrition Therapy & Goals - 05/04/18 0920      Nutrition Therapy   Diet  heart healthy, carb modified      Personal Nutrition Goals   Nutrition Goal  pt to identify and limit food sources of saturated fat, trans fat, sodium, refined carbohydrates    Personal Goal #2  pt to identify food quantities necessary to achieve weight loss of 6-24 lbs at graduation from cardiac rehab      [place], educate and counsel regarding individualized specific dietary modifications aiming towards targeted core components such as weight, hypertension, lipid management, diabetes, heart failure and other comorbidities.    Expected Outcomes  Short Term Goal: Understand basic principles of dietary content, such as calories, fat, sodium, cholesterol and nutrients.;Long Term Goal: Adherence to prescribed nutrition plan.       Nutrition Assessments: Nutrition Assessments - 05/04/18 0921      MEDFICTS Scores   Pre Score  27       Nutrition Goals Re-Evaluation:   Nutrition Goals Re-Evaluation:   Nutrition Goals Discharge (Final Nutrition Goals Re-Evaluation):   Psychosocial: Target Goals: Acknowledge  presence or absence of significant depression and/or stress, maximize coping skills, provide positive support system. Participant is able to verbalize types and ability to use techniques and skills needed for reducing stress and depression.  Initial Review & Psychosocial Screening: Initial Psych Review & Screening - 05/04/18 1122      Initial Review   Current issues with  Current Stress Concerns;Current Anxiety/Panic    Source of Stress Concerns  Poor Coping Skills;Occupation      Family Dynamics   [place]?  Yes   Pt lists her friends and ex-husband as sources of support.      Barriers  Psychosocial barriers to participate in program  The patient should benefit from training in stress management and relaxation.      Screening Interventions   Interventions  Encouraged to exercise;To provide support and resources with identified psychosocial needs;Provide feedback about the scores to participant    Expected Outcomes  Short Term goal: Utilizing psychosocial counselor, staff and physician to assist with identification of specific Stressors or current issues interfering with healing process. Setting desired goal for each stressor or current issue identified.       Quality of Life Scores: Quality of Life - 05/04/18 1059      Quality of Life   Select  Quality of Life      Quality of Life Scores   Health/Function Pre  13.93 %    Socioeconomic Pre  11.92 %    Psych/Spiritual Pre  8.36 %    Family Pre  15.67 %    GLOBAL Pre  12.4 %      Scores of 19 and below usually indicate a poorer quality of life in these areas.  A difference of  2-3 points is a clinically meaningful difference.  A difference of 2-3 points in the total score of the Quality of Life Index has been associated with significant improvement in overall quality of life, self-image, physical symptoms, and general health in studies assessing change in quality of life.  PHQ-9: Recent Review Flowsheet Data     Depression screen [facility] 2/9 04/26/2018 04/14/2018 04/03/2018 01/24/2018 09/27/2017   Decreased Interest 0 1 0 0 0   Down, Depressed, Hopeless 0 1 0 0 0   PHQ - 2 Score 0 2 0 0 0   Altered sleeping - 0 - - -   Tired, decreased energy - 0 - - -   Change in appetite - 0 - - -   Feeling bad or failure about yourself  - 0 - - -   Trouble concentrating - 0 - - -   Moving slowly or fidgety/restless - 0 - - -   Suicidal thoughts - 0 - - -   PHQ-9 Score - 2 - - -     Interpretation of Total Score  Total Score Depression Severity:  1-4 = Minimal depression, 5-9 = Mild depression, 10-14 = Moderate depression, 15-19 = Moderately severe depression, 20-27 = Severe depression   Psychosocial Evaluation and Intervention:   Psychosocial Re-Evaluation:   Psychosocial Discharge (Final Psychosocial Re-Evaluation):   Vocational Rehabilitation: Provide vocational rehab assistance to qualifying candidates.   Vocational Rehab Evaluation & Intervention: Vocational Rehab - 05/04/18 1135      Initial Vocational Rehab Evaluation & Intervention   Assessment shows need for Vocational Rehabilitation  Yes       Education: Education Goals: Education classes will be provided on a weekly basis, covering required topics. Participant will state understanding/return demonstration of topics presented.  Learning Barriers/Preferences: Learning Barriers/Preferences - 05/04/18 1101      Learning Barriers/Preferences   Learning Barriers  Sight    Learning Preferences  Skilled Demonstration;Verbal Instruction;Written Material       Education Topics: Count Your Pulse:  -Group instruction provided by verbal instruction, demonstration, patient participation and written materials to support subject.  Instructors address importance of being able to find your pulse and how to count your pulse when at home without a heart monitor.  Patients get hands on experience counting their pulse with staff help and  individually.   Heart Attack, Angina, and Risk Factor Modification:  -Group  instruction provided by verbal instruction, video, and written materials to support subject.  Instructors address signs and symptoms of angina and heart attacks.    Also discuss risk factors for heart disease and how to make changes to improve heart health risk factors.   Functional Fitness:  -Group instruction provided by verbal instruction, demonstration, patient participation, and written materials to support subject.  Instructors address safety measures for doing things around the house.  Discuss how to get up and down off the floor, how to pick things up properly, how to safely get out of a chair without assistance, and balance training.   Meditation and Mindfulness:  -Group instruction provided by verbal instruction, patient participation, and written materials to support subject.  Instructor addresses importance of mindfulness and meditation practice to help reduce stress and improve awareness.  Instructor also leads participants through a meditation exercise.    Stretching for Flexibility and Mobility:  -Group instruction provided by verbal instruction, patient participation, and written materials to support subject.  Instructors lead participants through series of stretches that are designed to increase flexibility thus improving mobility.  These stretches are additional exercise for major muscle groups that are typically performed during regular warm up and cool down.   Hands Only CPR:  -Group verbal, video, and participation provides a basic overview of AHA guidelines for community CPR. Role-play of emergencies allow participants the opportunity to practice calling for help and chest compression technique with discussion of AED use.   Hypertension: -Group verbal and written instruction that provides a basic overview of hypertension including the most recent diagnostic guidelines, risk factor reduction with  self-care instructions and medication management.    Nutrition I class: Heart Healthy Eating:  -Group instruction provided by PowerPoint slides, verbal discussion, and written materials to support subject matter. The instructor gives an explanation and review of the Therapeutic Lifestyle Changes diet recommendations, which includes a discussion on lipid goals, dietary fat, sodium, fiber, plant stanol/sterol esters, sugar, and the components of a well-balanced, healthy diet.   Nutrition II class: Lifestyle Skills:  -Group instruction provided by PowerPoint slides, verbal discussion, and written materials to support subject matter. The instructor gives an explanation and review of label reading, grocery shopping for heart health, heart healthy recipe modifications, and ways to make healthier choices when eating out.   Diabetes Question & Answer:  -Group instruction provided by PowerPoint slides, verbal discussion, and written materials to support subject matter. The instructor gives an explanation and review of diabetes co-morbidities, pre- and post-prandial blood glucose goals, pre-exercise blood glucose goals, signs, symptoms, and treatment of hypoglycemia and hyperglycemia, and foot care basics.   Diabetes Blitz:  -Group instruction provided by PowerPoint slides, verbal discussion, and written materials to support subject matter. The instructor gives an explanation and review of the physiology behind type 1 and type 2 diabetes, diabetes medications and rational behind using different medications, pre- and post-prandial blood glucose recommendations and Hemoglobin A1c goals, diabetes diet, and exercise including blood glucose guidelines for exercising safely.    Portion Distortion:  -Group instruction provided by PowerPoint slides, verbal discussion, written materials, and food models to support subject matter. The instructor gives an explanation of serving size versus portion size, changes in  portions sizes over the last 20 years, and what consists of a serving from each food group.   Stress Management:  -Group instruction provided by verbal instruction, video, and written materials to support subject matter.  Instructors review role of stress in heart disease and  how to cope with stress positively.     Exercising on Your Own:  -Group instruction provided by verbal instruction, power point, and written materials to support subject.  Instructors discuss benefits of exercise, components of exercise, frequency and intensity of exercise, and end points for exercise.  Also discuss use of nitroglycerin and activating EMS.  Review options of places to exercise outside of rehab.  Review guidelines for sex with heart disease.   Cardiac Drugs I:  -Group instruction provided by verbal instruction and written materials to support subject.  Instructor reviews cardiac drug classes: antiplatelets, anticoagulants, beta blockers, and statins.  Instructor discusses reasons, side effects, and lifestyle considerations for each drug class.   Cardiac Drugs II:  -Group instruction provided by verbal instruction and written materials to support subject.  Instructor reviews cardiac drug classes: angiotensin converting enzyme inhibitors (ACE-I), angiotensin II receptor blockers (ARBs), nitrates, and calcium channel blockers.  Instructor discusses reasons, side effects, and lifestyle considerations for each drug class.   Anatomy and Physiology of the Circulatory System:  Group verbal and written instruction and models provide basic cardiac anatomy and physiology, with the coronary electrical and arterial systems. Review of: AMI, Angina, Valve disease, Heart Failure, Peripheral Artery Disease, Cardiac Arrhythmia, Pacemakers, and the ICD.   Other Education:  -Group or individual verbal, written, or video instructions that support the educational goals of the cardiac rehab program.   Holiday Eating Survival  Tips:  -Group instruction provided by PowerPoint slides, verbal discussion, and written materials to support subject matter. The instructor gives patients tips, tricks, and techniques to help them not only survive but enjoy the holidays despite the onslaught of food that accompanies the holidays.   Knowledge Questionnaire Score: Knowledge Questionnaire Score - 05/04/18 1059      Knowledge Questionnaire Score   Pre Score  21/24       Core Components/Risk Factors/Patient Goals at Admission: Personal Goals and Risk Factors at Admission - 05/04/18 1059      Core Components/Risk Factors/Patient Goals on Admission    Weight Management  Yes;Weight Loss    Intervention  Weight Management: Develop a combined nutrition and exercise program designed to reach desired caloric intake, while maintaining appropriate intake of nutrient and fiber, sodium and fats, and appropriate energy expenditure required for the weight goal.;Weight Management: Provide education and appropriate resources to help participant work on and attain dietary goals.;Weight Management/Obesity: Establish reasonable short term and long term weight goals.;Obesity: Provide education and appropriate resources to help participant work on and attain dietary goals.    Admit Weight  146 lb 13.2 oz (66.6 kg)    Goal Weight: Short Term  140 lb (63.5 kg)    Goal Weight: Long Term  130 lb (59 kg)    Expected Outcomes  Short Term: Continue to assess and modify interventions until short term weight is achieved;Long Term: Adherence to nutrition and physical activity/exercise program aimed toward attainment of established weight goal;Weight Loss: Understanding of general recommendations for a balanced deficit meal plan, which promotes 1-2 lb weight loss per week and includes a negative energy balance of [phone] kcal/d;Understanding recommendations for meals to include 15-35% energy as protein, 25-35% energy from fat, 35-60% energy from carbohydrates,  less than 200mg  of dietary cholesterol, 20-35 gm of total fiber daily;Understanding of distribution of calorie intake throughout the day with the consumption of 4-5 meals/snacks    Hypertension  Yes    Intervention  Monitor prescription use compliance.;Provide education on lifestyle modifcations including regular physical  activity/exercise, weight management, moderate sodium restriction and increased consumption of fresh fruit, vegetables, and low fat dairy, alcohol moderation, and smoking cessation.    Expected Outcomes  Short Term: Continued assessment and intervention until BP is < 140/70mm HG in hypertensive participants. < 130/105mm HG in hypertensive participants with diabetes, heart failure or chronic kidney disease.;Long Term: Maintenance of blood pressure at goal levels.    Lipids  Yes    Intervention  Provide education and support for participant on nutrition & aerobic/resistive exercise along with prescribed medications to achieve LDL 70mg , HDL >40mg .    Expected Outcomes  Short Term: Participant states understanding of desired cholesterol values and is compliant with medications prescribed. Participant is following exercise prescription and nutrition guidelines.;Long Term: Cholesterol controlled with medications as prescribed, with individualized exercise RX and with personalized nutrition plan. Value goals: LDL < 70mg , HDL > 40 mg.    Stress  Yes    Intervention  Offer individual and/or small group education and counseling on adjustment to heart disease, stress management and health-related lifestyle change. Teach and support self-help strategies.;Refer participants experiencing significant psychosocial distress to appropriate mental health specialists for further evaluation and treatment. When possible, include family members and significant others in education/counseling sessions.    Expected Outcomes  Short Term: Participant demonstrates changes in health-related behavior, relaxation and  other stress management skills, ability to obtain effective social support, and compliance with psychotropic medications if prescribed.;Long Term: Emotional wellbeing is indicated by absence of clinically significant psychosocial distress or social isolation.       Core Components/Risk Factors/Patient Goals Review:    Core Components/Risk Factors/Patient Goals at Discharge (Final Review):    ITP Comments: ITP Comments    Row Name 05/02/18 1448 05/04/18 1030         ITP Comments  Medical Director-Dr. Fransico Him   Medical Director-Dr. Fransico Him          Comments: Patient attended orientation from 0803 to 0919 to review rules and guidelines for program. Completed 6 minute walk test, Intitial ITP, and exercise prescription.  VSS. Telemetry-SR.  Asymptomatic.

## 2018-05-05 ENCOUNTER — Telehealth: Payer: Self-pay | Admitting: Cardiovascular Disease

## 2018-05-05 NOTE — Telephone Encounter (Signed)
New message  Patient states that that a chair fell against her thigh. The patient states that it has turned green  and there is a lump. The patient is on Brilinta and wants to know what she should do about the lump? Please call to discuss.

## 2018-05-05 NOTE — Telephone Encounter (Signed)
Pt sts that she dropped a beach chair on her thigh about 44min ago.  She notes a bruise that blue and green about about the size of a silver dollar. She is concerned because she is currently taking Brilinta and Asa. She has started to ice it and it has not grown in size.  Adv Mrs. Rowe Robert that I will talk with one of our pharmacist for further adv.  Adv the pt per our Pharm-D she should continue to ice the area over the next 24 hours. She should not be alarmed if the bruise starts to spread towards her knee. She should let us know if it becomes more raised , painful or stiff. Pt verbalized understanding.  Pt sts that she will upload a picture to my chart to be reviewed by our office.

## 2018-05-08 ENCOUNTER — Ambulatory Visit (HOSPITAL_COMMUNITY): Payer: PRIVATE HEALTH INSURANCE

## 2018-05-08 NOTE — Telephone Encounter (Signed)
Paperwork scanned and faxed on 05/08/18

## 2018-05-10 ENCOUNTER — Encounter (HOSPITAL_COMMUNITY)
Admission: RE | Admit: 2018-05-10 | Discharge: 2018-05-10 | Disposition: A | Discharge status: Home / Self Care | Payer: PRIVATE HEALTH INSURANCE | Source: Ambulatory Visit | Attending: Cardiovascular Disease | Admitting: Cardiovascular Disease

## 2018-05-10 ENCOUNTER — Encounter (HOSPITAL_COMMUNITY): Payer: Self-pay

## 2018-05-10 ENCOUNTER — Ambulatory Visit (HOSPITAL_COMMUNITY): Payer: PRIVATE HEALTH INSURANCE

## 2018-05-10 DIAGNOSIS — Z7982 Long term (current) use of aspirin: Secondary | ICD-10-CM | POA: Diagnosis not present

## 2018-05-10 DIAGNOSIS — I2510 Atherosclerotic heart disease of native coronary artery without angina pectoris: Secondary | ICD-10-CM | POA: Diagnosis not present

## 2018-05-10 DIAGNOSIS — Z79899 Other long term (current) drug therapy: Secondary | ICD-10-CM | POA: Diagnosis not present

## 2018-05-10 DIAGNOSIS — Z853 Personal history of malignant neoplasm of breast: Secondary | ICD-10-CM | POA: Diagnosis not present

## 2018-05-10 DIAGNOSIS — Z955 Presence of coronary angioplasty implant and graft: Secondary | ICD-10-CM

## 2018-05-10 DIAGNOSIS — K219 Gastro-esophageal reflux disease without esophagitis: Secondary | ICD-10-CM | POA: Diagnosis not present

## 2018-05-10 DIAGNOSIS — E785 Hyperlipidemia, unspecified: Secondary | ICD-10-CM | POA: Diagnosis not present

## 2018-05-10 DIAGNOSIS — F329 Major depressive disorder, single episode, unspecified: Secondary | ICD-10-CM | POA: Diagnosis not present

## 2018-05-10 DIAGNOSIS — I10 Essential (primary) hypertension: Secondary | ICD-10-CM | POA: Diagnosis not present

## 2018-05-10 DIAGNOSIS — M810 Age-related osteoporosis without current pathological fracture: Secondary | ICD-10-CM | POA: Diagnosis not present

## 2018-05-10 NOTE — Progress Notes (Signed)
Daily Session Note  Patient Details  Name: [name] MRN: [id number] Date of Birth: October 18, 1950 Referring Provider:   Flowsheet Row CARDIAC REHAB PHASE II ORIENTATION from 05/04/2018 in [place]  Referring Provider  Troy Sine MD       Encounter Date: 05/10/2018  Check In: Session Check In - 05/10/18 1407    Check-In          Supervising physician immediately available to respond to emergencies  [facility] immediately available    Physician(s)  Dr Loleta Books    Location  [facility]    Staff Present  Seward Carol, MS, ACSM CEP, Exercise Physiologist;Maria Whitaker, RN, BSN;Joann Rion, RN, BSN    Medication changes reported      No    Fall or balance concerns reported     No    Tobacco Cessation  No Change    Warm-up and Cool-down  Performed as group-led instruction    Resistance Training Performed  No    VAD Patient?  No    PAD/SET Patient?  No        Pain Assessment          Currently in Pain?  No/denies           Capillary Blood Glucose: No results found for this or any previous visit (from the past 24 hour(s)).    Social History   Tobacco Use  Smoking Status Never Smoker  Smokeless Tobacco Never Used    Goals Met:  Exercise tolerated well  Goals Unmet:  Not Applicable  Comments: Pt started cardiac rehab today.  Pt tolerated light exercise without difficulty. VSS, telemetry-sinus rhythm, asymptomatic.  Medication list reconciled. Pt denies barriers to medicaiton compliance.  PSYCHOSOCIAL ASSESSMENT:  PHQ-11.   Pt has new patient appointment with counselor 05/11/2018.  Pt depression, stress and anxiety are exacerbated by her recent cardiac event.  Pt is taking proactive steps to improve her symptoms and recovery. Pt oriented to exercise equipment and routine.    Understanding verbalized. Andi Hence, RN, BSN Cardiac Pulmonary Rehab 05/10/18 4:22 PM     Dr. Fransico Him is Medical Director  for Cardiac Rehab at [facility].

## 2018-05-12 ENCOUNTER — Encounter (HOSPITAL_COMMUNITY)
Admission: RE | Admit: 2018-05-12 | Discharge: 2018-05-12 | Disposition: A | Discharge status: Home / Self Care | Payer: PRIVATE HEALTH INSURANCE | Source: Ambulatory Visit | Attending: Cardiovascular Disease | Admitting: Cardiovascular Disease

## 2018-05-12 DIAGNOSIS — I2510 Atherosclerotic heart disease of native coronary artery without angina pectoris: Secondary | ICD-10-CM | POA: Diagnosis not present

## 2018-05-12 DIAGNOSIS — Z955 Presence of coronary angioplasty implant and graft: Secondary | ICD-10-CM

## 2018-05-15 ENCOUNTER — Encounter (HOSPITAL_COMMUNITY)
Admission: RE | Admit: 2018-05-15 | Discharge: 2018-05-15 | Disposition: A | Discharge status: Home / Self Care | Payer: PRIVATE HEALTH INSURANCE | Source: Ambulatory Visit | Attending: Cardiovascular Disease | Admitting: Cardiovascular Disease

## 2018-05-15 ENCOUNTER — Ambulatory Visit (HOSPITAL_COMMUNITY): Payer: PRIVATE HEALTH INSURANCE

## 2018-05-15 DIAGNOSIS — Z955 Presence of coronary angioplasty implant and graft: Secondary | ICD-10-CM

## 2018-05-15 DIAGNOSIS — I2510 Atherosclerotic heart disease of native coronary artery without angina pectoris: Secondary | ICD-10-CM | POA: Diagnosis not present

## 2018-05-15 NOTE — Progress Notes (Signed)
[name] 67 y.o. female Nutrition Note Spoke with pt. Nutrition plan and goals reviewed with pt. Pt is following heart healthy diet. Pt wants to lose wt, 20 lbs, last time patient weighed 127 was 10 years ago. Discussed realistic expectations of weight loss with patient. Pt has not been trying to lose wt despite RD giving recommendations at cardiac orientation. Wt loss tips reviewed with patient(label reading, how to build a healthy plate, portion sizes, eating frequently across the day). Recommend pt decrease higher fat cuts of meat, sugar sweetened beverages (sweet tea, soda), increase complex carbs over refined carbohydrates, and lower sodium intake by decreasing convenience foods. Discussed using healthy oils to cook with at meals instead of butter. Pt may be pre-diabetic, last A1c indicates blood glucose elevated at 5.7. Recommended pt swap out refined carbohydrates for complex carbohydrates. Educated pt on complex vs simple carbohydrates, and distributed label reading handout. Pt with dx of CHF. Per discussion, pt does not use canned/convenience foods often. Pt rarely adds salt to food. Pt eats out infrequently. Pt expressed understanding of the information reviewed. Pt aware of nutrition education classes offered and plans on attending nutrition classes.  Lab Results  Component Value Date   HGBA1C 5.7 (H) 09/24/2017    Wt Readings from Last 3 Encounters:  05/04/18 146 lb 13.2 oz (66.6 kg)  04/26/18 147 lb (66.7 kg)  04/14/18 150 lb 9.6 oz (68.3 kg)    Nutrition Diagnosis ? Food-and nutrition-related knowledge deficit related to lack of exposure to information as related to diagnosis of: ? CVD ? Pre-diabetes  Nutrition Intervention ? Pt's individual nutrition plan reviewed with pt. ? Benefits of adopting Heart Healthy diet discussed when Medficts reviewed.   Goal(s)  Pt to identify and limit food sources of saturated fat, trans fat, refined carbohydrates and sodium  Pt to  identify food quantities necessary to achieve weight loss of 6-24 lbs. at graduation from cardiac rehab. Goal wt of 127 lb desired.   Pt able to name foods that affect blood glucose.  Plan:   Pt to attend nutrition classes ? Nutrition I ? Nutrition II ? Portion Distortion   Will provide client-centered nutrition education as part of interdisciplinary care  Monitor and evaluate progress toward nutrition goal with team.    Laurina Bustle, MS, RD, LDN 05/15/2018 2:37 PM

## 2018-05-16 ENCOUNTER — Encounter: Payer: Self-pay | Admitting: Family Medicine

## 2018-05-16 ENCOUNTER — Other Ambulatory Visit: Payer: Self-pay

## 2018-05-16 ENCOUNTER — Ambulatory Visit: Payer: PRIVATE HEALTH INSURANCE | Admitting: Family Medicine

## 2018-05-16 VITALS — BP 104/60 | HR 70 | Temp 98.4°F | Ht 62.0 in | Wt 146.8 lb

## 2018-05-16 DIAGNOSIS — G4700 Insomnia, unspecified: Secondary | ICD-10-CM | POA: Diagnosis not present

## 2018-05-16 DIAGNOSIS — F411 Generalized anxiety disorder: Secondary | ICD-10-CM

## 2018-05-16 DIAGNOSIS — J4520 Mild intermittent asthma, uncomplicated: Secondary | ICD-10-CM

## 2018-05-16 MED ORDER — ALBUTEROL SULFATE HFA 108 (90 BASE) MCG/ACT IN AERS: 1.0000 | INHALATION_SPRAY | RESPIRATORY_TRACT | 0 refills | Status: DC | PRN

## 2018-05-16 NOTE — Progress Notes (Signed)
Subjective:  By signing my name below, I, [name], attest that this documentation has been prepared under the direction and in the presence of Ellen Agreste, MD Electronically Signed: Ladene Artist, ED Scribe 05/16/2018 at 11:49 AM.   Patient ID: [name], female    DOB: 24-Jul-1951, 67 y.o.   MRN: [id number]  Chief Complaint  Patient presents with  . medical leave    f/u (PHQ9=9)  . MEDICATION Request    [place] HFA   HPI [name] is a 67 y.o. female who presents to Primary Care at [facility] for f/u of anxiety. Last seen 9/11. Had been having increasing anxiety with suspected component of adjustment component from health issues. Increased prozac to 40 mg qd the prior wk. 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 Having difficulty with sleep, planned sleep study. Prev used klonopin but had some side-effects. She was overwhelmed with work and decided to have her take some time off starting 9/19. Continued prozac 40 mg qd, trial of hydroxyzine 25 mg tabs for sleep and anxiety. Planned to meet with therapist through EAP program at work and planned to meet with sleep specialist regarding sleep study.  Pt states that time off from work has helped anxiety/stress. She met with therapist [name] for the first time last wk, next meeting tomorrow. States prozac is starting to "kick-in" but she is still "up and down". Reports 2 days ago was the best day; she didn't have any anxiety. States some days are difficult as she "can't identify her purpose anymore". She is scheduled to return to work in 2 days but states she doesn't feel ready due to feeling overwhelmed and anxious. Reports her biggest struggle is her new job trying to turn her into someone she isn't and requiring her to do more technical and computer work which is difficult for her. Pt requests an extension on her FMLA paperwork today. She has tried 1  hydroxyzine tab at night which helps her sleep but she still has bizarre dreams; she has not tried meds during the day time. Pt has an upcoming appointment with a sleep specialist on 10/29. Denies SI, new side-effects.  Depression screen [facility] 2/9 05/16/2018 05/10/2018 04/26/2018 04/14/2018 04/03/2018  Decreased Interest 1 2 0 1 0  Down, Depressed, Hopeless 1 2 0 1 0  PHQ - 2 Score 2 4 0 2 0  Altered sleeping 2 1 - 0 -  Tired, decreased energy 2 1 - 0 -  Change in appetite 1 1 - 0 -  Feeling bad or failure about yourself  2 2 - 0 -  Trouble concentrating 0 1 - 0 -  Moving slowly or fidgety/restless 0 1 - 0 -  Suicidal thoughts 0 0 - 0 -  PHQ-9 Score 9 11 - 2 -  Difficult doing work/chores Somewhat difficult Extremely dIfficult - - -   Requesting Refill for Albuterol Has not recently been treated for asthma but problem list of childhood asthma. Undergoing cardiac rehab for prev PCTA stent on 03/07/18. - Pt reports someone's cologne triggered wheezing for 1.5 days 1 wk ago while at choir rehearsal. Denies cp, chest tightness. States she rarely uses inhaler but would like to have it available just in case. Next appointment with Dr. Claiborne Billings in Nov. States this wk is the first wk that she has rehab 3 days/wk and she has been walking some.  Patient Active Problem  List   Diagnosis Date Noted  . RLS (restless legs syndrome) 04/05/2018  . Atypical chest pain 03/10/2018  . CAD in native artery 03/10/2018  . Elevated blood pressure reading 03/10/2018  . Palpitations 03/10/2018  . Chest pain 03/09/2018  . Hyperlipidemia 03/08/2018  . Coronary artery disease involving native coronary artery of native heart with unstable angina pectoris ([place]) 03/07/2018  . Unstable angina ([place])   . Cough 01/02/2013  . Anxiety state 01/02/2013  . Breast cancer (DCIS), stage 0, Left, receptor +, dx 2012 09/08/2010   Past Medical History:  Diagnosis Date  . Allergy    "dust mite residue, mold, mildew, grapefruit, chili  peppers, cat dander, some weeds" (03/07/2018)  . Anxiety   . Arthritis    "minor; right knee, left hip" (03/07/2018)  . Bruises easily   . Cancer of left breast ([place])    double mastectomy 01/26/2011  . Childhood asthma   . Contact lens/glasses fitting   . Coronary artery disease    a.  She underwent LHC 7/23 which revealed 95% stenosis of proximal LAD, managed with PCI/DES.   . Depression   . Essential hypertension   . GERD (gastroesophageal reflux disease)    "at times" (03/07/2018)  . Heart murmur   . Hiatal hernia   . High cholesterol   . History of kidney stones    "lots; no OR" (03/07/2018)  . Osteoporosis   . Rectal bleeding    hemorroid  . Ringing in ears, bilateral    "since ~ 06/2017" (03/07/2018)  . Sinus drainage    Past Surgical History:  Procedure Laterality Date  . APPENDECTOMY    . BREAST LUMPECTOMY W/ NEEDLE LOCALIZATION Left 10/05/2010   lumpectomy  . BREAST SURGERY    . CORONARY STENT INTERVENTION N/A 03/07/2018   Procedure: CORONARY STENT INTERVENTION;  Surgeon: Troy Sine, MD;  Location: [facility];  Service: Cardiovascular;  Laterality: N/A;  . LEFT HEART CATH AND CORONARY ANGIOGRAPHY N/A 03/07/2018   Procedure: LEFT HEART CATH AND CORONARY ANGIOGRAPHY;  Surgeon: Troy Sine, MD;  Location: [facility] [facility];  Service: Cardiovascular;  Laterality: N/A;  . MASTECTOMY Right 01/26/2011  . MASTECTOMY COMPLETE / SIMPLE W/ SENTINEL NODE BIOPSY Left 01/26/2011  . OVARIAN CYST SURGERY  1997, 2001, 2008   Allergies  Allergen Reactions  . Elavil [Amitriptyline Hcl] Rash  . Sulfur Rash   Prior to Admission medications   Medication Sig Start Date End Date Taking? Authorizing Provider  amLODipine (NORVASC) 5 MG tablet Take 1 tablet (5 mg total) by mouth daily. 03/11/18   Charlie Pitter, PA-C  aspirin EC 81 MG tablet Take 1 tablet (81 mg total) by mouth daily. 02/27/18   Troy Sine, MD  atorvastatin (LIPITOR) 80 MG tablet Take 1 tablet (80 mg  total) by mouth daily at 6 PM. 03/08/18   Kroeger, Lorelee Cover., PA-C  cetirizine (ZYRTEC) 10 MG tablet Take 10 mg by mouth daily as needed for allergies.     [provider]  clonazePAM (KLONOPIN) 0.5 MG tablet Take 1 tablet (0.5 mg total) by mouth 2 (two) times daily as needed. 01/24/18   Ellen Agreste, MD  FLUoxetine (PROZAC) 20 MG tablet Take 2 tablets (40 mg total) by mouth daily. 04/26/18   Ellen Agreste, MD  fluticasone (FLONASE) 50 MCG/ACT nasal spray Place 2 sprays into both nostrils daily. 06/17/16   Ellen Agreste, MD  guaiFENesin (MUCINEX) 600 MG 12 hr tablet Take  600 mg by mouth 2 (two) times daily as needed.    [provider]  HYDROcodone-acetaminophen (NORCO/VICODIN) 5-325 MG tablet Take 1 tablet by mouth every 6 (six) hours as needed for moderate pain. 04/14/18   Ellen Agreste, MD  hydrOXYzine (ATARAX/VISTARIL) 25 MG tablet Take 0.5-1 tablets (12.5-25 mg total) by mouth 3 (three) times daily as needed for anxiety. 04/26/18   Ellen Agreste, MD  meclizine (ANTIVERT) 25 MG tablet Take 1 tablet (25 mg total) by mouth 3 (three) times daily as needed for dizziness. 01/24/18   Ellen Agreste, MD  metoprolol succinate (TOPROL XL) 25 MG 24 hr tablet Take 0.5 tablets (12.5 mg total) by mouth daily. 03/10/18   Dunn, Nedra Hai, PA-C  nitroGLYCERIN (NITROSTAT) 0.4 MG SL tablet Place 1 tablet (0.4 mg total) under the tongue every 5 (five) minutes as needed for chest pain. 02/27/18 05/28/18  Troy Sine, MD  ondansetron (ZOFRAN ODT) 4 MG disintegrating tablet Take 1 tablet (4 mg total) by mouth every 8 (eight) hours as needed for nausea or vomiting. 04/14/18   Ellen Agreste, MD  Rotigotine (NEUPRO) 1 MG/24HR PT24 Place 1 patch (1 mg total) onto the skin at bedtime as needed. May remove in the morning. 04/05/18   Melvenia Beam, MD  ticagrelor (BRILINTA) 90 MG TABS tablet Take 1 tablet (90 mg total) by mouth 2 (two) times daily. 03/08/18   Kroeger, Lorelee Cover., PA-C    Social History   Socioeconomic History  . Marital status: Divorced    Spouse name: Not on file  . Number of children: Not on file  . Years of education: Not on file  . Highest education level: Bachelor's degree (e.g., BA, AB, BS)  Occupational History  . Not on file  Social Needs  . Financial resource strain: Not on file  . Food insecurity:    Worry: Not on file    Inability: Not on file  . Transportation needs:    Medical: Not on file    Non-medical: Not on file  Tobacco Use  . Smoking status: Never Smoker  . Smokeless tobacco: Never Used  Substance and Sexual Activity  . Alcohol use: Yes    Comment: 03/07/2018 "probably once/month"  . Drug use: No  . Sexual activity: Not on file  Lifestyle  . Physical activity:    Days per week: Not on file    Minutes per session: Not on file  . Stress: Not on file  Relationships  . Social connections:    Talks on phone: Not on file    Gets together: Not on file    Attends religious service: Not on file    Active member of club or organization: Not on file    Attends meetings of clubs or organizations: Not on file    Relationship status: Not on file  . Intimate partner violence:    Fear of current or ex partner: Not on file    Emotionally abused: Not on file    Physically abused: Not on file    Forced sexual activity: Not on file  Other Topics Concern  . Not on file  Social History Narrative   Lives at home alone   Right handed   Caffeine: 1/2 cup of coffee daily that has (1/2 & 1/2 caf)   Review of Systems  Respiratory: Positive for wheezing (rare). Negative for chest tightness.   Cardiovascular: Negative for chest pain.  Psychiatric/Behavioral: Positive for dysphoric mood. Negative for  sleep disturbance and suicidal ideas. The patient is nervous/anxious.       Objective:   Physical Exam  Constitutional: She is oriented to person, place, and time. She appears well-developed and well-nourished. No distress.  HENT:   Head: Normocephalic and atraumatic.  Eyes: Conjunctivae and EOM are normal.  Neck: Neck supple. No tracheal deviation present.  Cardiovascular: Normal rate and regular rhythm. Exam reveals no gallop and no friction rub.  No murmur heard. Pulmonary/Chest: Effort normal and breath sounds normal. No respiratory distress. She has no wheezes.  Musculoskeletal: Normal range of motion.  Neurological: She is alert and oriented to person, place, and time.  Skin: Skin is warm and dry.  Psychiatric: She has a normal mood and affect. Her behavior is normal.  Nursing note and vitals reviewed.  Vitals:   05/16/18 1132  BP: 104/60  Pulse: 70  Temp: 98.4 F (36.9 C)  TempSrc: Oral  SpO2: 95%  Weight: 146 lb 12.8 oz (66.6 kg)  Height: _0  (1.575 m)      Assessment & Plan:   JAMILLA GALLI is a 67 y.o. female Anxiety state Insomnia, unspecified type  -Minimally improved, still with significant anxiety regarding work.  Has only had one visit with therapist.  -Continue follow-up with counseling/therapist.  Continue same dose Prozac at 40 mg daily for now  -Option of half dosing of hydroxyzine discussed, continue same dose otherwise.  -Note provided for work for an additional 2 weeks out then possible return to part-time or shortened shifts.  Follow-up within 2 weeks to determine that return to work status.  -Over 25 minutes of visit with greater than 50% counseling.  We did discuss different approaches to her current stressors and outlook.  Also recommend discussing her specific triggers and concerns with therapist tomorrow.  Mild intermittent reactive airway disease without complication - Plan: albuterol (PROVENTIL HFA;VENTOLIN HFA) 108 (90 Base) MCG/ACT inhaler  -Rare use, likely triggered by perfume recently.  Lungs clear on exam today.  Albuterol inhaler provided if needed.  RTC precautions if persistent/frequent use.     Meds ordered this encounter  Medications  . albuterol (PROVENTIL  HFA;VENTOLIN HFA) 108 (90 Base) MCG/ACT inhaler    Sig: Inhale 1-2 puffs into the lungs every 4 (four) hours as needed for wheezing or shortness of breath.    Dispense:  1 Inhaler    Refill:  0   Patient Instructions   I will extend the time off work for another 2 weeks. Please keep follow up with therapist, continue prozac same dose, and hydroxyzine if needed at night or during the day as needed for anxiety. 1/2 pill of hydroxyzine may be just as effective. Discuss future work directions, and current symptoms with your therapist.   I will refill the albuterol if needed for asthma or wheezing, but if that is needed more frequently, please return to look at other treatments.   If you have lab work done today you will be contacted with your lab results within the next 2 weeks.  If you have not heard from [place] then please contact us. The fastest way to get your results is to register for My Chart.   IF you received an x-ray today, you will receive an invoice from [facility]. Please contact [facility] at [phone] with questions or concerns regarding your invoice.   IF you received labwork today, you will receive an invoice from [place]. Please contact [facility] at [phone] with questions or concerns regarding your invoice.  Our billing staff will not be able to assist you with questions regarding bills from these companies.  You will be contacted with the lab results as soon as they are available. The fastest way to get your results is to activate your My Chart account. Instructions are located on the last page of this paperwork. If you have not heard from [place] regarding the results in 2 weeks, please contact this office.       I personally performed the services described in this documentation, which was scribed in my presence. The recorded information has been reviewed and considered for accuracy and completeness, addended by me as needed, and agree with information  above.  Signed,   [name], MD Primary Care at [place].  05/16/18 1:31 PM

## 2018-05-16 NOTE — Patient Instructions (Addendum)
I will extend the time off work for another 2 weeks. Please keep follow up with therapist, continue prozac same dose, and hydroxyzine if needed at night or during the day as needed for anxiety. 1/2 pill of hydroxyzine may be just as effective. Discuss future work directions, and current symptoms with your therapist.   I will refill the albuterol if needed for asthma or wheezing, but if that is needed more frequently, please return to look at other treatments.   If you have lab work done today you will be contacted with your lab results within the next 2 weeks.  If you have not heard from [place] then please contact us. The fastest way to get your results is to register for My Chart.   IF you received an x-ray today, you will receive an invoice from [facility]. Please contact [facility] at [phone] with questions or concerns regarding your invoice.   IF you received labwork today, you will receive an invoice from [place]. Please contact [facility] at [phone] with questions or concerns regarding your invoice.   Our billing staff will not be able to assist you with questions regarding bills from these companies.  You will be contacted with the lab results as soon as they are available. The fastest way to get your results is to activate your My Chart account. Instructions are located on the last page of this paperwork. If you have not heard from [place] regarding the results in 2 weeks, please contact this office.

## 2018-05-17 ENCOUNTER — Ambulatory Visit (HOSPITAL_COMMUNITY): Payer: PRIVATE HEALTH INSURANCE

## 2018-05-17 ENCOUNTER — Encounter (HOSPITAL_COMMUNITY)
Admission: RE | Admit: 2018-05-17 | Discharge: 2018-05-17 | Disposition: A | Discharge status: Home / Self Care | Payer: PRIVATE HEALTH INSURANCE | Source: Ambulatory Visit | Attending: Cardiovascular Disease | Admitting: Cardiovascular Disease

## 2018-05-17 DIAGNOSIS — E785 Hyperlipidemia, unspecified: Secondary | ICD-10-CM | POA: Diagnosis not present

## 2018-05-17 DIAGNOSIS — Z853 Personal history of malignant neoplasm of breast: Secondary | ICD-10-CM | POA: Insufficient documentation

## 2018-05-17 DIAGNOSIS — K219 Gastro-esophageal reflux disease without esophagitis: Secondary | ICD-10-CM | POA: Diagnosis not present

## 2018-05-17 DIAGNOSIS — Z955 Presence of coronary angioplasty implant and graft: Secondary | ICD-10-CM | POA: Insufficient documentation

## 2018-05-17 DIAGNOSIS — F329 Major depressive disorder, single episode, unspecified: Secondary | ICD-10-CM | POA: Diagnosis not present

## 2018-05-17 DIAGNOSIS — M810 Age-related osteoporosis without current pathological fracture: Secondary | ICD-10-CM | POA: Insufficient documentation

## 2018-05-17 DIAGNOSIS — Z7982 Long term (current) use of aspirin: Secondary | ICD-10-CM | POA: Insufficient documentation

## 2018-05-17 DIAGNOSIS — Z79899 Other long term (current) drug therapy: Secondary | ICD-10-CM | POA: Insufficient documentation

## 2018-05-17 DIAGNOSIS — I2510 Atherosclerotic heart disease of native coronary artery without angina pectoris: Secondary | ICD-10-CM | POA: Insufficient documentation

## 2018-05-17 DIAGNOSIS — I10 Essential (primary) hypertension: Secondary | ICD-10-CM | POA: Insufficient documentation

## 2018-05-18 NOTE — Progress Notes (Signed)
Cardiac Individual Treatment Plan  Patient Details  Name: [name] MRN: [id number] Date of Birth: 04-Sep-1950 Referring Provider:   Flowsheet Row CARDIAC REHAB PHASE II ORIENTATION from 05/04/2018 in [facility]  Referring Provider  Troy Sine MD       Initial Encounter Date:  [place] PHASE II ORIENTATION from 05/04/2018 in [facility]  Date  05/04/18      Visit Diagnosis: 07/23/2019Stented coronary artery  Patient's Home Medications on Admission:  Current Outpatient Medications:  .  albuterol (PROVENTIL HFA;VENTOLIN HFA) 108 (90 Base) MCG/ACT inhaler, Inhale 1-2 puffs into the lungs every 4 (four) hours as needed for wheezing or shortness of breath., Disp: 1 Inhaler, Rfl: 0 .  amLODipine (NORVASC) 5 MG tablet, Take 1 tablet (5 mg total) by mouth daily., Disp: 30 tablet, Rfl: 6 .  aspirin EC 81 MG tablet, Take 1 tablet (81 mg total) by mouth daily., Disp: 90 tablet, Rfl: 3 .  atorvastatin (LIPITOR) 80 MG tablet, Take 1 tablet (80 mg total) by mouth daily at 6 PM., Disp: 90 tablet, Rfl: 3 .  cetirizine (ZYRTEC) 10 MG tablet, Take 10 mg by mouth daily as needed for allergies. , Disp: , Rfl:  .  clonazePAM (KLONOPIN) 0.5 MG tablet, Take 1 tablet (0.5 mg total) by mouth 2 (two) times daily as needed., Disp: 30 tablet, Rfl: 1 .  FLUoxetine (PROZAC) 20 MG tablet, Take 2 tablets (40 mg total) by mouth daily., Disp: 180 tablet, Rfl: 1 .  fluticasone (FLONASE) 50 MCG/ACT nasal spray, Place 2 sprays into both nostrils daily., Disp: 16 g, Rfl: 6 .  guaiFENesin (MUCINEX) 600 MG 12 hr tablet, Take 600 mg by mouth 2 (two) times daily as needed., Disp: , Rfl:  .  HYDROcodone-acetaminophen (NORCO/VICODIN) 5-325 MG tablet, Take 1 tablet by mouth every 6 (six) hours as needed for moderate pain., Disp: 15 tablet, Rfl: 0 .  hydrOXYzine (ATARAX/VISTARIL) 25 MG tablet, Take 0.5-1 tablets (12.5-25 mg total) by mouth 3  (three) times daily as needed for anxiety., Disp: 30 tablet, Rfl: 0 .  meclizine (ANTIVERT) 25 MG tablet, Take 1 tablet (25 mg total) by mouth 3 (three) times daily as needed for dizziness., Disp: 30 tablet, Rfl: 0 .  metoprolol succinate (TOPROL XL) 25 MG 24 hr tablet, Take 0.5 tablets (12.5 mg total) by mouth daily., Disp: 30 tablet, Rfl: 6 .  nitroGLYCERIN (NITROSTAT) 0.4 MG SL tablet, Place 1 tablet (0.4 mg total) under the tongue every 5 (five) minutes as needed for chest pain., Disp: 25 tablet, Rfl: 3 .  ondansetron (ZOFRAN ODT) 4 MG disintegrating tablet, Take 1 tablet (4 mg total) by mouth every 8 (eight) hours as needed for nausea or vomiting., Disp: 10 tablet, Rfl: 0 .  Rotigotine (NEUPRO) 1 MG/24HR PT24, Place 1 patch (1 mg total) onto the skin at bedtime as needed. May remove in the morning., Disp: 30 patch, Rfl: 0 .  ticagrelor (BRILINTA) 90 MG TABS tablet, Take 1 tablet (90 mg total) by mouth 2 (two) times daily., Disp: 180 tablet, Rfl: 3  Past Medical History: Past Medical History:  Diagnosis Date  . Allergy    "dust mite residue, mold, mildew, grapefruit, chili peppers, cat dander, some weeds" (03/07/2018)  . Anxiety   . Arthritis    "minor; right knee, left hip" (03/07/2018)  . Bruises easily   . Cancer of left breast ([place])    double mastectomy 01/26/2011  .  Childhood asthma   . Contact lens/glasses fitting   . Coronary artery disease    a.  She underwent LHC 7/23 which revealed 95% stenosis of proximal LAD, managed with PCI/DES.   . Depression   . Essential hypertension   . GERD (gastroesophageal reflux disease)    "at times" (03/07/2018)  . Heart murmur   . Hiatal hernia   . High cholesterol   . History of kidney stones    "lots; no OR" (03/07/2018)  . Osteoporosis   . Rectal bleeding    hemorroid  . Ringing in ears, bilateral    "since ~ 06/2017" (03/07/2018)  . Sinus drainage     Tobacco Use: Social History   Tobacco Use  Smoking Status Never Smoker   Smokeless Tobacco Never Used    Labs: Recent Review Flowsheet Data    Labs for ITP Cardiac and Pulmonary Rehab Latest Ref Rng & Units 05/21/2015 01/17/2016 07/22/2016 09/24/2017 03/06/2018   Cholestrol 100 - 199 mg/dL 247(H) 163 201(H) 249(H) 142   LDLCALC 0 - 99 mg/dL 147(H) 84 126(H) 174(H) 72   HDL >39 mg/dL 34(L) 42(L) 46 45 39(L)   Trlycerides 0 - 149 mg/dL 329(H) 184(H) 143 148 153(H)   Hemoglobin A1c 4.8 - 5.6 % 5.9(H) - 5.6 5.7(H) -   TCO2 0 - 100 mmol/L - - - - -      Capillary Blood Glucose: No results found for: GLUCAP   Exercise Target Goals: Exercise Program Goal: Individual exercise prescription set using results from initial 6 min walk test and THRR while considering  patient's activity barriers and safety.   Exercise Prescription Goal: Initial exercise prescription builds to 30-45 minutes a day of aerobic activity, 2-3 days per week.  Home exercise guidelines will be given to patient during program as part of exercise prescription that the participant will acknowledge.  Activity Barriers & Risk Stratification: Activity Barriers & Cardiac Risk Stratification - 05/04/18 1103    Activity Barriers & Cardiac Risk Stratification          Activity Barriers  Deconditioning    Cardiac Risk Stratification  Moderate           6 Minute Walk: 6 Minute Walk    6 Minute Walk    Row Name 05/04/18 1101   Phase  Initial   Distance  1600 feet   Walk Time  6 minutes   # of Rest Breaks  0   MPH  3.03   METS  3.39   RPE  12   Perceived Dyspnea   0   VO2 Peak  11.87   Symptoms  No   Resting HR  67 bpm   Resting BP  108/60   Resting Oxygen Saturation   98 %   Exercise Oxygen Saturation  during 6 min walk  98 %   Max Ex. HR  90 bpm   Max Ex. BP  118/60   2 Minute Post BP  98/60          Oxygen Initial Assessment:   Oxygen Re-Evaluation:   Oxygen Discharge (Final Oxygen Re-Evaluation):   Initial Exercise Prescription: Initial Exercise Prescription - 05/04/18  1100    Date of Initial Exercise RX and Referring Provider          Date  05/04/18    Referring Provider  Troy Sine MD     Expected Discharge Date  08/02/18        Recumbant Bike  Level  1.5    Watts  25    Minutes  10    METs  3.21        NuStep          Level  2    SPM  75    Minutes  10    METs  3        Track          Laps  12    Minutes  10    METs  3.07        Prescription Details          Frequency (times per week)  3x    Duration  Progress to 30 minutes of continuous aerobic without signs/symptoms of physical distress        Intensity          THRR 40-80% of Max Heartrate  62-123    Ratings of Perceived Exertion  11-13    Perceived Dyspnea  0-4        Progression          Progression  Continue progressive overload as per policy without signs/symptoms or physical distress.        Resistance Training          Training Prescription  Yes    Weight  3lbs    Reps  10-15           Perform Capillary Blood Glucose checks as needed.  Exercise Prescription Changes: Exercise Prescription Changes    Response to Exercise    Row Name 05/10/18 1326 05/15/18 1324   Blood Pressure (Admit)  108/60  114/58   Blood Pressure (Exercise)  120/60  138/60   Blood Pressure (Exit)  100/58  110/52   Heart Rate (Admit)  75 bpm  67 bpm   Heart Rate (Exercise)  119 bpm  97 bpm   Heart Rate (Exit)  72 bpm  67 bpm   Rating of Perceived Exertion (Exercise)  12  11   Symptoms  none  none   Duration  Progress to 30 minutes of  aerobic without signs/symptoms of physical distress  Progress to 30 minutes of  aerobic without signs/symptoms of physical distress   Intensity  THRR unchanged  THRR unchanged       Progression    Row Name 05/10/18 1326 05/15/18 1324   Progression  Continue to progress workloads to maintain intensity without signs/symptoms of physical distress.  Continue to progress workloads to maintain intensity without signs/symptoms of  physical distress.   Average METs  2.7  3       Resistance Training    Row Name 05/10/18 1326 05/15/18 1324   Training Prescription  No Relaxation day, no weights  Yes   Weight  no documentation  3lbs   Reps  no documentation  10-15   Time  no documentation  10 Minutes       Interval Training    Row Name 05/10/18 1326 05/15/18 1324   Interval Training  No  No       Recumbant Bike    Row Name 05/10/18 1326 05/15/18 1324   Level  1.5  1.5   Watts  19  23   Minutes  10  10   METs  2.94  3.1       NuStep    Row Name 05/10/18 1326 05/15/18 1324   Level  2  3   SPM  75  75  Minutes  10  10   METs  2  2.2       Track    Row Name 05/10/18 1326 05/15/18 1324   Laps  13  16   Minutes  10  10   METs  3.26  3.79          Exercise Comments: Exercise Comments    Row Name 05/15/18 1358   Exercise Comments  Reviewed METs and goals with patient.      Exercise Goals and Review: Exercise Goals    Exercise Goals    Row Name 05/04/18 1103   Increase Physical Activity  Yes   Intervention  Provide advice, education, support and counseling about physical activity/exercise needs.;Develop an individualized exercise prescription for aerobic and resistive training based on initial evaluation findings, risk stratification, comorbidities and participant's personal goals.   Expected Outcomes  Short Term: Attend rehab on a regular basis to increase amount of physical activity.;Long Term: Exercising regularly at least 3-5 days a week.;Long Term: Add in home exercise to make exercise part of routine and to increase amount of physical activity.   Increase Strength and Stamina  Yes   Intervention  Provide advice, education, support and counseling about physical activity/exercise needs.;Develop an individualized exercise prescription for aerobic and resistive training based on initial evaluation findings, risk stratification, comorbidities and participant's personal goals.   Expected Outcomes   Short Term: Increase workloads from initial exercise prescription for resistance, speed, and METs.;Short Term: Perform resistance training exercises routinely during rehab and add in resistance training at home;Long Term: Improve cardiorespiratory fitness, muscular endurance and strength as measured by increased METs and functional capacity (6MWT)   Able to understand and use rate of perceived exertion (RPE) scale  Yes   Intervention  Provide education and explanation on how to use RPE scale   Expected Outcomes  Short Term: Able to use RPE daily in rehab to express subjective intensity level;Long Term:  Able to use RPE to guide intensity level when exercising independently   Knowledge and understanding of Target Heart Rate Range (THRR)  Yes   Intervention  Provide education and explanation of THRR including how the numbers were predicted and where they are located for reference   Expected Outcomes  Short Term: Able to state/look up THRR;Long Term: Able to use THRR to govern intensity when exercising independently;Short Term: Able to use daily as guideline for intensity in rehab   Able to check pulse independently  Yes   Intervention  Provide education and demonstration on how to check pulse in carotid and radial arteries.;Review the importance of being able to check your own pulse for safety during independent exercise   Expected Outcomes  Short Term: Able to explain why pulse checking is important during independent exercise;Long Term: Able to check pulse independently and accurately   Understanding of Exercise Prescription  Yes   Intervention  Provide education, explanation, and written materials on patient's individual exercise prescription   Expected Outcomes  Short Term: Able to explain program exercise prescription;Long Term: Able to explain home exercise prescription to exercise independently          Exercise Goals Re-Evaluation : Exercise Goals Re-Evaluation    Exercise Goal  Re-Evaluation    Row Name 05/15/18 1358   Exercise Goals Review  Able to understand and use rate of perceived exertion (RPE) scale;Increase Physical Activity   Comments  Patient is walking 1 day in addition to exercise at cardiac rehab. Pt understands and is able  to use RPE scale appropriately.   Expected Outcomes  Increase workloads as tolerated to help increase energy and stamina.          Discharge Exercise Prescription (Final Exercise Prescription Changes): Exercise Prescription Changes - 05/15/18 1324    Response to Exercise          Blood Pressure (Admit)  114/58    Blood Pressure (Exercise)  138/60    Blood Pressure (Exit)  110/52    Heart Rate (Admit)  67 bpm    Heart Rate (Exercise)  97 bpm    Heart Rate (Exit)  67 bpm    Rating of Perceived Exertion (Exercise)  11    Symptoms  none    Duration  Progress to 30 minutes of  aerobic without signs/symptoms of physical distress    Intensity  THRR unchanged        Progression          Progression  Continue to progress workloads to maintain intensity without signs/symptoms of physical distress.    Average METs  3        Resistance Training          Training Prescription  Yes    Weight  3lbs    Reps  10-15    Time  10 Minutes        Interval Training          Interval Training  No        Recumbant Bike          Level  1.5    Watts  23    Minutes  10    METs  3.1        NuStep          Level  3    SPM  75    Minutes  10    METs  2.2        Track          Laps  16    Minutes  10    METs  3.79           Nutrition:  Target Goals: Understanding of nutrition guidelines, daily intake of sodium 1500mg , cholesterol 200mg , calories 30% from fat and 7% or less from saturated fats, daily to have 5 or more servings of fruits and vegetables.  Biometrics: Pre Biometrics - 05/04/18 1102    Pre Biometrics          Height  5\' 2"  (1.575 m)    Weight  66.6 kg    Waist Circumference  35 inches    Hip  Circumference  40 inches    Waist to Hip Ratio  0.88 %    BMI (Calculated)  26.85    Triceps Skinfold  28 mm    % Body Fat  38.7 %    Grip Strength  30 kg    Flexibility  15 in    Single Leg Stand  30 seconds            Nutrition Therapy Plan and Nutrition Goals: Nutrition Therapy & Goals - 05/04/18 0920    Nutrition Therapy          Diet  heart healthy, carb modified        Personal Nutrition Goals          Nutrition Goal  pt to identify and limit food sources of saturated fat, trans fat, sodium, refined carbohydrates    Personal Goal #2  pt  to identify food quantities necessary to achieve weight loss of 6-24 lbs at graduation from cardiac rehab        [place], educate and counsel regarding individualized specific dietary modifications aiming towards targeted core components such as weight, hypertension, lipid management, diabetes, heart failure and other comorbidities.    Expected Outcomes  Short Term Goal: Understand basic principles of dietary content, such as calories, fat, sodium, cholesterol and nutrients.;Long Term Goal: Adherence to prescribed nutrition plan.           Nutrition Assessments: Nutrition Assessments - 05/04/18 0921    MEDFICTS Scores          Pre Score  27           Nutrition Goals Re-Evaluation:   Nutrition Goals Re-Evaluation:   Nutrition Goals Discharge (Final Nutrition Goals Re-Evaluation):   Psychosocial: Target Goals: Acknowledge presence or absence of significant depression and/or stress, maximize coping skills, provide positive support system. Participant is able to verbalize types and ability to use techniques and skills needed for reducing stress and depression.  Initial Review & Psychosocial Screening: Initial Psych Review & Screening - 05/04/18 1122    Initial Review          Current issues with  Current Stress Concerns;Current Anxiety/Panic    Source of Stress Concerns  Poor  Coping Skills;Occupation        Family Dynamics          [place]?  Yes   Pt lists her friends and ex-husband as sources of support.        Barriers          Psychosocial barriers to participate in program  The patient should benefit from training in stress management and relaxation.        Screening Interventions          Interventions  Encouraged to exercise;To provide support and resources with identified psychosocial needs;Provide feedback about the scores to participant    Expected Outcomes  Short Term goal: Utilizing psychosocial counselor, staff and physician to assist with identification of specific Stressors or current issues interfering with healing process. Setting desired goal for each stressor or current issue identified.           Quality of Life Scores: Quality of Life - 05/04/18 1059    Quality of Life          Select  Quality of Life        Quality of Life Scores          Health/Function Pre  13.93 %    Socioeconomic Pre  11.92 %    Psych/Spiritual Pre  8.36 %    Family Pre  15.67 %    GLOBAL Pre  12.4 %          Scores of 19 and below usually indicate a poorer quality of life in these areas.  A difference of  2-3 points is a clinically meaningful difference.  A difference of 2-3 points in the total score of the Quality of Life Index has been associated with significant improvement in overall quality of life, self-image, physical symptoms, and general health in studies assessing change in quality of life.  PHQ-9: Recent Review Flowsheet Data    Depression screen [facility] 2/9 05/16/2018 05/10/2018 04/26/2018 04/14/2018 04/03/2018   Decreased Interest 1 2 0 1 0   Down, Depressed, Hopeless 1 2 0 1 0  PHQ - 2 Score 2 4 0 2 0   Altered sleeping 2 1 - 0 -   Tired, decreased energy 2 1 - 0 -   Change in appetite 1 1 - 0 -   Feeling bad or failure about yourself  2 2 - 0 -   Trouble concentrating 0 1 - 0 -   Moving slowly or fidgety/restless 0 1 - 0 -    Suicidal thoughts 0 0 - 0 -   PHQ-9 Score 9 11 - 2 -   Difficult doing work/chores Somewhat difficult Extremely dIfficult - - -     Interpretation of Total Score  Total Score Depression Severity:  1-4 = Minimal depression, 5-9 = Mild depression, 10-14 = Moderate depression, 15-19 = Moderately severe depression, 20-27 = Severe depression   Psychosocial Evaluation and Intervention: Psychosocial Evaluation - 05/10/18 1449    Psychosocial Evaluation & Interventions          Interventions  Stress management education;Relaxation education;Encouraged to exercise with the program and follow exercise prescription;Therapist referral;Physician referral    Comments  pt demonstrates significant stress and anxiety from chronic illness and occupation. pt has taken medical leave of absence from work and took a restful vacation. pt is working with her PCP who has made referral to counselor.      Expected Outcomes  pt will exhibit improved outlook and coping skills.     Continue Psychosocial Services   Follow up required by staff           Psychosocial Re-Evaluation: Psychosocial Re-Evaluation    Psychosocial Re-Evaluation    [place] Name 05/18/18 [phone]   Current issues with  Current Stress Concerns;Current Anxiety/Panic   Comments  pt with health related stress/anxiety. pt currently seeking care from therapist and PCP. pt participating in self care.     Expected Outcomes  pt will exhibit improved outlook and coping skills.    Interventions  Therapist referral;Physician referral;Relaxation education;Stress management education;Encouraged to attend Cardiac Rehabilitation for the exercise   Continue Psychosocial Services   Follow up required by staff       Initial Review    Row Name 05/18/18 0822   Source of Stress Concerns  Occupation;Poor Coping Skills          Psychosocial Discharge (Final Psychosocial Re-Evaluation): Psychosocial Re-Evaluation - 05/18/18 0822    Psychosocial Re-Evaluation           Current issues with  Current Stress Concerns;Current Anxiety/Panic    Comments  pt with health related stress/anxiety. pt currently seeking care from therapist and PCP. pt participating in self care.      Expected Outcomes  pt will exhibit improved outlook and coping skills.     Interventions  Therapist referral;Physician referral;Relaxation education;Stress management education;Encouraged to attend Cardiac Rehabilitation for the exercise    Continue Psychosocial Services   Follow up required by staff        Initial Review          Source of Stress Concerns  Occupation;Poor Coping Skills           Vocational Rehabilitation: Provide vocational rehab assistance to qualifying candidates.   Vocational Rehab Evaluation & Intervention: Vocational Rehab - 05/04/18 1135    Initial Vocational Rehab Evaluation & Intervention          Assessment shows need for Vocational Rehabilitation  Yes           Education: Education Goals: Education classes will be provided on a weekly basis,  covering required topics. Participant will state understanding/return demonstration of topics presented.  Learning Barriers/Preferences: Learning Barriers/Preferences - 05/04/18 1101    Learning Barriers/Preferences          Learning Barriers  Sight    Learning Preferences  Skilled Demonstration;Verbal Instruction;Written Material           Education Topics: Count Your Pulse:  -Group instruction provided by verbal instruction, demonstration, patient participation and written materials to support subject.  Instructors address importance of being able to find your pulse and how to count your pulse when at home without a heart monitor.  Patients get hands on experience counting their pulse with staff help and individually.   Heart Attack, Angina, and Risk Factor Modification:  -Group instruction provided by verbal instruction, video, and written materials to support subject.  Instructors address  signs and symptoms of angina and heart attacks.    Also discuss risk factors for heart disease and how to make changes to improve heart health risk factors. Flowsheet Row CARDIAC REHAB PHASE II EXERCISE from 05/17/2018 in [facility]  Date  05/17/18  Instruction Review Code  2- Demonstrated Understanding      Functional Fitness:  -Group instruction provided by verbal instruction, demonstration, patient participation, and written materials to support subject.  Instructors address safety measures for doing things around the house.  Discuss how to get up and down off the floor, how to pick things up properly, how to safely get out of a chair without assistance, and balance training.   Meditation and Mindfulness:  -Group instruction provided by verbal instruction, patient participation, and written materials to support subject.  Instructor addresses importance of mindfulness and meditation practice to help reduce stress and improve awareness.  Instructor also leads participants through a meditation exercise.    Stretching for Flexibility and Mobility:  -Group instruction provided by verbal instruction, patient participation, and written materials to support subject.  Instructors lead participants through series of stretches that are designed to increase flexibility thus improving mobility.  These stretches are additional exercise for major muscle groups that are typically performed during regular warm up and cool down.   Hands Only CPR:  -Group verbal, video, and participation provides a basic overview of AHA guidelines for community CPR. Role-play of emergencies allow participants the opportunity to practice calling for help and chest compression technique with discussion of AED use.   Hypertension: -Group verbal and written instruction that provides a basic overview of hypertension including the most recent diagnostic guidelines, risk factor reduction with  self-care instructions and medication management.    Nutrition I class: Heart Healthy Eating:  -Group instruction provided by PowerPoint slides, verbal discussion, and written materials to support subject matter. The instructor gives an explanation and review of the Therapeutic Lifestyle Changes diet recommendations, which includes a discussion on lipid goals, dietary fat, sodium, fiber, plant stanol/sterol esters, sugar, and the components of a well-balanced, healthy diet.   Nutrition II class: Lifestyle Skills:  -Group instruction provided by PowerPoint slides, verbal discussion, and written materials to support subject matter. The instructor gives an explanation and review of label reading, grocery shopping for heart health, heart healthy recipe modifications, and ways to make healthier choices when eating out.   Diabetes Question & Answer:  -Group instruction provided by PowerPoint slides, verbal discussion, and written materials to support subject matter. The instructor gives an explanation and review of diabetes co-morbidities, pre- and post-prandial blood glucose goals, pre-exercise blood glucose goals, signs, symptoms,  and treatment of hypoglycemia and hyperglycemia, and foot care basics.   Diabetes Blitz:  -Group instruction provided by PowerPoint slides, verbal discussion, and written materials to support subject matter. The instructor gives an explanation and review of the physiology behind type 1 and type 2 diabetes, diabetes medications and rational behind using different medications, pre- and post-prandial blood glucose recommendations and Hemoglobin A1c goals, diabetes diet, and exercise including blood glucose guidelines for exercising safely.    Portion Distortion:  -Group instruction provided by PowerPoint slides, verbal discussion, written materials, and food models to support subject matter. The instructor gives an explanation of serving size versus portion size, changes in  portions sizes over the last 20 years, and what consists of a serving from each food group.   Stress Management:  -Group instruction provided by verbal instruction, video, and written materials to support subject matter.  Instructors review role of stress in heart disease and how to cope with stress positively.     Exercising on Your Own:  -Group instruction provided by verbal instruction, power point, and written materials to support subject.  Instructors discuss benefits of exercise, components of exercise, frequency and intensity of exercise, and end points for exercise.  Also discuss use of nitroglycerin and activating EMS.  Review options of places to exercise outside of rehab.  Review guidelines for sex with heart disease.   Cardiac Drugs I:  -Group instruction provided by verbal instruction and written materials to support subject.  Instructor reviews cardiac drug classes: antiplatelets, anticoagulants, beta blockers, and statins.  Instructor discusses reasons, side effects, and lifestyle considerations for each drug class.   Cardiac Drugs II:  -Group instruction provided by verbal instruction and written materials to support subject.  Instructor reviews cardiac drug classes: angiotensin converting enzyme inhibitors (ACE-I), angiotensin II receptor blockers (ARBs), nitrates, and calcium channel blockers.  Instructor discusses reasons, side effects, and lifestyle considerations for each drug class.   Anatomy and Physiology of the Circulatory System:  Group verbal and written instruction and models provide basic cardiac anatomy and physiology, with the coronary electrical and arterial systems. Review of: AMI, Angina, Valve disease, Heart Failure, Peripheral Artery Disease, Cardiac Arrhythmia, Pacemakers, and the ICD. Flowsheet Row CARDIAC REHAB PHASE II EXERCISE from 05/17/2018 in [place]  Date  05/10/18  Instruction Review Code  1- Verbalizes  Understanding      Other Education:  -Group or individual verbal, written, or video instructions that support the educational goals of the cardiac rehab program.   Holiday Eating Survival Tips:  -Group instruction provided by PowerPoint slides, verbal discussion, and written materials to support subject matter. The instructor gives patients tips, tricks, and techniques to help them not only survive but enjoy the holidays despite the onslaught of food that accompanies the holidays.   Knowledge Questionnaire Score: Knowledge Questionnaire Score - 05/04/18 1059    Knowledge Questionnaire Score          Pre Score  21/24           Core Components/Risk Factors/Patient Goals at Admission: Personal Goals and Risk Factors at Admission - 05/04/18 1059    Core Components/Risk Factors/Patient Goals on Admission           Weight Management  Yes;Weight Loss    Intervention  Weight Management: Develop a combined nutrition and exercise program designed to reach desired caloric intake, while maintaining appropriate intake of nutrient and fiber, sodium and fats, and appropriate energy expenditure required for the weight goal.;Weight  Management: Provide education and appropriate resources to help participant work on and attain dietary goals.;Weight Management/Obesity: Establish reasonable short term and long term weight goals.;Obesity: Provide education and appropriate resources to help participant work on and attain dietary goals.    Admit Weight  146 lb 13.2 oz (66.6 kg)    Goal Weight: Short Term  140 lb (63.5 kg)    Goal Weight: Long Term  130 lb (59 kg)    Expected Outcomes  Short Term: Continue to assess and modify interventions until short term weight is achieved;Long Term: Adherence to nutrition and physical activity/exercise program aimed toward attainment of established weight goal;Weight Loss: Understanding of general recommendations for a balanced deficit meal plan, which promotes 1-2 lb  weight loss per week and includes a negative energy balance of [phone] kcal/d;Understanding recommendations for meals to include 15-35% energy as protein, 25-35% energy from fat, 35-60% energy from carbohydrates, less than 200mg  of dietary cholesterol, 20-35 gm of total fiber daily;Understanding of distribution of calorie intake throughout the day with the consumption of 4-5 meals/snacks    Hypertension  Yes    Intervention  Monitor prescription use compliance.;Provide education on lifestyle modifcations including regular physical activity/exercise, weight management, moderate sodium restriction and increased consumption of fresh fruit, vegetables, and low fat dairy, alcohol moderation, and smoking cessation.    Expected Outcomes  Short Term: Continued assessment and intervention until BP is < 140/78mm HG in hypertensive participants. < 130/54mm HG in hypertensive participants with diabetes, heart failure or chronic kidney disease.;Long Term: Maintenance of blood pressure at goal levels.    Lipids  Yes    Intervention  Provide education and support for participant on nutrition & aerobic/resistive exercise along with prescribed medications to achieve LDL 70mg , HDL >40mg .    Expected Outcomes  Short Term: Participant states understanding of desired cholesterol values and is compliant with medications prescribed. Participant is following exercise prescription and nutrition guidelines.;Long Term: Cholesterol controlled with medications as prescribed, with individualized exercise RX and with personalized nutrition plan. Value goals: LDL < 70mg , HDL > 40 mg.    Stress  Yes    Intervention  Offer individual and/or small group education and counseling on adjustment to heart disease, stress management and health-related lifestyle change. Teach and support self-help strategies.;Refer participants experiencing significant psychosocial distress to appropriate mental health specialists for further evaluation and  treatment. When possible, include family members and significant others in education/counseling sessions.    Expected Outcomes  Short Term: Participant demonstrates changes in health-related behavior, relaxation and other stress management skills, ability to obtain effective social support, and compliance with psychotropic medications if prescribed.;Long Term: Emotional wellbeing is indicated by absence of clinically significant psychosocial distress or social isolation.           Core Components/Risk Factors/Patient Goals Review:  Goals and Risk Factor Review    Core Components/Risk Factors/Patient Goals Review    Row Name 05/10/18 1618 05/18/18 0824   Personal Goals Review  Weight Management/Obesity;Hypertension;Lipids;Stress  Weight Management/Obesity;Hypertension;Lipids;Stress   Review  pt with multiple CAD RF demonstrates eagerness to participate in CR program.  pt personal goals are to improve dietary habits, lose weight and have an enthusiastic outlook on life.   pt with multiple CAD RF demonstrates eagerness to participate in CR program.  pt personal goals are to improve dietary habits, lose weight and have an enthusiastic outlook on life.    Expected Outcomes  pt will participate in CR exercise, nutrition and lifestyle modification to decrease overall RF  and improve quality of life.   pt will participate in CR exercise, nutrition and lifestyle modification to decrease overall RF and improve quality of life.           Core Components/Risk Factors/Patient Goals at Discharge (Final Review):  Goals and Risk Factor Review - 05/18/18 0824    Core Components/Risk Factors/Patient Goals Review          Personal Goals Review  Weight Management/Obesity;Hypertension;Lipids;Stress    Review  pt with multiple CAD RF demonstrates eagerness to participate in CR program.  pt personal goals are to improve dietary habits, lose weight and have an enthusiastic outlook on life.     Expected Outcomes  pt  will participate in CR exercise, nutrition and lifestyle modification to decrease overall RF and improve quality of life.            ITP Comments: ITP Comments    Row Name 05/02/18 1448 05/04/18 1030 05/10/18 1430 05/18/18 0821   ITP Comments  Medical Director-Dr. Fransico Him   Medical Director-Dr. Fransico Him   pt started group exercise. pt tolerated light activity without difficulty. pt oriented to exercise equipment and safety routine.   30 day ITP review.       Comments:

## 2018-05-19 ENCOUNTER — Ambulatory Visit: Payer: PRIVATE HEALTH INSURANCE | Admitting: Family Medicine

## 2018-05-19 ENCOUNTER — Encounter (HOSPITAL_COMMUNITY)
Admission: RE | Admit: 2018-05-19 | Discharge: 2018-05-19 | Disposition: A | Discharge status: Home / Self Care | Payer: PRIVATE HEALTH INSURANCE | Source: Ambulatory Visit | Attending: Cardiovascular Disease | Admitting: Cardiovascular Disease

## 2018-05-19 DIAGNOSIS — Z955 Presence of coronary angioplasty implant and graft: Secondary | ICD-10-CM

## 2018-05-19 DIAGNOSIS — I2510 Atherosclerotic heart disease of native coronary artery without angina pectoris: Secondary | ICD-10-CM | POA: Diagnosis not present

## 2018-05-19 NOTE — Progress Notes (Signed)
Reviewed home exercise guidelines with patient including endpoints, temperature precautions, target heart rate and rate of perceived exertion. Pt plans to walk as her mode of home exercise. Pt voices understanding of instructions given. Ladainian Therien M Luzmaria Devaux, MS, ACSM CEP  

## 2018-05-22 ENCOUNTER — Encounter (HOSPITAL_COMMUNITY)
Admission: RE | Admit: 2018-05-22 | Discharge: 2018-05-22 | Disposition: A | Discharge status: Home / Self Care | Payer: PRIVATE HEALTH INSURANCE | Source: Ambulatory Visit | Attending: Cardiovascular Disease | Admitting: Cardiovascular Disease

## 2018-05-22 ENCOUNTER — Ambulatory Visit (HOSPITAL_COMMUNITY): Payer: PRIVATE HEALTH INSURANCE

## 2018-05-22 DIAGNOSIS — Z955 Presence of coronary angioplasty implant and graft: Secondary | ICD-10-CM

## 2018-05-22 DIAGNOSIS — I2510 Atherosclerotic heart disease of native coronary artery without angina pectoris: Secondary | ICD-10-CM | POA: Diagnosis not present

## 2018-05-24 ENCOUNTER — Encounter (HOSPITAL_COMMUNITY)
Admission: RE | Admit: 2018-05-24 | Discharge: 2018-05-24 | Disposition: A | Discharge status: Home / Self Care | Payer: PRIVATE HEALTH INSURANCE | Source: Ambulatory Visit | Attending: Cardiovascular Disease | Admitting: Cardiovascular Disease

## 2018-05-24 ENCOUNTER — Ambulatory Visit (HOSPITAL_COMMUNITY): Payer: PRIVATE HEALTH INSURANCE

## 2018-05-24 DIAGNOSIS — Z955 Presence of coronary angioplasty implant and graft: Secondary | ICD-10-CM

## 2018-05-24 DIAGNOSIS — Z0271 Encounter for disability determination: Secondary | ICD-10-CM

## 2018-05-24 DIAGNOSIS — I2510 Atherosclerotic heart disease of native coronary artery without angina pectoris: Secondary | ICD-10-CM | POA: Diagnosis not present

## 2018-05-25 ENCOUNTER — Telehealth: Payer: Self-pay | Admitting: Cardiovascular Disease

## 2018-05-25 NOTE — Telephone Encounter (Signed)
Spoke with pt. Recommended that she contact her pharmacy to see if they can request an override from her insurance to refill Brilinta earlier. Pt sts that her pharmacy tried and the override was denied. Adv pt that we are currently out of Brilinta samples. Pt sts that she is able to refill the medication on 10/12. Recommended that she contact her pharmacy to purchase a 2-3 day supply out of pocket. Pt sts that she will.  Adv her to contact the office if we can be of further assistance.

## 2018-05-25 NOTE — Telephone Encounter (Signed)
  Pt c/o medication issue:  1. Name of Medication: ticagrelor (BRILINTA) 90 MG TABS tablet  2. How are you currently taking this medication (dosage and times per day)? Take 1 tablet (90 mg total) by mouth 2 (two) times daily  3. Are you having a reaction (difficulty breathing--STAT)?  No  4. What is your medication issue? Left bottle on the stove and turned oven on. Pills got hot and pharmacy advised that insurance will not pay for her to get a refill yet. She is worried about taking them after they got hot.

## 2018-05-26 ENCOUNTER — Encounter (HOSPITAL_COMMUNITY)
Admission: RE | Admit: 2018-05-26 | Discharge: 2018-05-26 | Disposition: A | Discharge status: Home / Self Care | Payer: PRIVATE HEALTH INSURANCE | Source: Ambulatory Visit | Attending: Cardiovascular Disease | Admitting: Cardiovascular Disease

## 2018-05-26 DIAGNOSIS — I2510 Atherosclerotic heart disease of native coronary artery without angina pectoris: Secondary | ICD-10-CM | POA: Diagnosis not present

## 2018-05-26 DIAGNOSIS — Z955 Presence of coronary angioplasty implant and graft: Secondary | ICD-10-CM

## 2018-05-29 ENCOUNTER — Ambulatory Visit (HOSPITAL_COMMUNITY): Payer: PRIVATE HEALTH INSURANCE

## 2018-05-29 ENCOUNTER — Encounter (HOSPITAL_COMMUNITY)
Admission: RE | Admit: 2018-05-29 | Discharge: 2018-05-29 | Disposition: A | Discharge status: Home / Self Care | Payer: PRIVATE HEALTH INSURANCE | Source: Ambulatory Visit | Attending: Cardiovascular Disease | Admitting: Cardiovascular Disease

## 2018-05-29 DIAGNOSIS — Z955 Presence of coronary angioplasty implant and graft: Secondary | ICD-10-CM

## 2018-05-29 DIAGNOSIS — I2510 Atherosclerotic heart disease of native coronary artery without angina pectoris: Secondary | ICD-10-CM | POA: Diagnosis not present

## 2018-05-30 ENCOUNTER — Ambulatory Visit: Payer: PRIVATE HEALTH INSURANCE | Admitting: Family Medicine

## 2018-05-30 ENCOUNTER — Other Ambulatory Visit: Payer: Self-pay

## 2018-05-30 ENCOUNTER — Encounter: Payer: Self-pay | Admitting: Family Medicine

## 2018-05-30 VITALS — BP 118/66 | HR 60 | Temp 98.0°F | Ht 62.0 in | Wt 146.8 lb

## 2018-05-30 DIAGNOSIS — F4323 Adjustment disorder with mixed anxiety and depressed mood: Secondary | ICD-10-CM | POA: Diagnosis not present

## 2018-05-30 DIAGNOSIS — G4700 Insomnia, unspecified: Secondary | ICD-10-CM

## 2018-05-30 MED ORDER — TRAZODONE HCL 50 MG PO TABS: 25.0000 mg | ORAL_TABLET | Freq: Every evening | ORAL | 1 refills | Status: DC | PRN

## 2018-05-30 NOTE — Progress Notes (Signed)
Subjective:    Patient ID: [name], female    DOB: 1951/03/02, 67 y.o.   MRN: [id number]  HPI [name] is a 67 y.o. female Presents today for: Chief Complaint  Patient presents with  . Anxiety    2 week f/u PHQ9=9   Here for follow-up of anxiety.  See previous notes.  She had met with therapist once at last visit on October 1.  Out of work since September 19.  Was continued on higher dose of Prozac at 40 mg daily.  She did have some improvement in anxiety symptoms out of work when discussed last visit.  For difficulty with sleep, she had tried hydroxyzine with some improvement, appointment with sleep specialist on October 29th.   Feels like she is doing better, but not great. Today is not a good day.  Feels a bit of a cloud today. Sometimes feels that this occurs in the morning, but able to shake it off during the day.  Has had 3 appointments with therapist. Feels like 3rd visit went best - felt like uphill swing. Doesn't feel like she has not had enough treatments yet. Next appt 10/31. Feels up and positive at times, then gloomy on other days. Still taking prozac daily 62m, rare hydroxyzine - does not feel like it helps much and bad dreams. Next appt with therapist in 2 weeks. Does not feel ready to go back to work yet. Thinking about possible part time job elsewhere, but may need to look elsewhere - not option at current facility.  Has been told that part time not an option.  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 Wants to go back to her job eventually.   Would like to extend time out of work a little longer. Wondering if higher dose of prozac needed.  Increased to 417mon 04/12/18. Feels fog in the morning, but some difficulty with sleep still.   No increased alcohol use.  No SI/HI.   [facility] 05/30/2018 05/16/2018 05/10/2018 04/26/2018 04/14/2018  Decreased Interest _0 0 1    Down, Depressed, Hopeless _1 0 1  PHQ - 2 Score _2 0 2  Altered sleeping _3 - 0  Tired, decreased energy _4 - 0  Change in appetite _5 - 0  Feeling bad or failure about yourself  _6 - 0  Trouble concentrating 0 0 1 - 0  Moving slowly or fidgety/restless 0 0 1 - 0  Suicidal thoughts 0 0 0 - 0  PHQ-9 Score _7 - 2  Difficult doing work/chores Somewhat difficult Somewhat difficult Extremely dIfficult - -      Patient Active Problem List   Diagnosis Date Noted  . RLS (restless legs syndrome) 04/05/2018  . Atypical chest pain 03/10/2018  . CAD in native artery 03/10/2018  . Elevated blood pressure reading 03/10/2018  . Palpitations 03/10/2018  . Chest pain 03/09/2018  . Hyperlipidemia 03/08/2018  . Coronary artery disease involving native coronary artery of native heart with unstable angina pectoris ([facility]/23/2019  . Unstable angina ([facility]  . Cough 01/02/2013  . Anxiety state 01/02/2013  . Breast cancer (DCIS), stage 0, Left, receptor +, dx 2012 09/08/2010   Past Medical History:  Diagnosis Date  . Allergy    "dust mite residue, mold, mildew, grapefruit, chili peppers, cat  dander, some weeds" (03/07/2018)  . Anxiety   . Arthritis    "minor; right knee, left hip" (03/07/2018)  . Bruises easily   . Cancer of left breast ([place])    double mastectomy 01/26/2011  . Childhood asthma   . Contact lens/glasses fitting   . Coronary artery disease    a.  She underwent LHC 7/23 which revealed 95% stenosis of proximal LAD, managed with PCI/DES.   . Depression   . Essential hypertension   . GERD (gastroesophageal reflux disease)    "at times" (03/07/2018)  . Heart murmur   . Hiatal hernia   . High cholesterol   . History of kidney stones    "lots; no OR" (03/07/2018)  . Osteoporosis   . Rectal bleeding    hemorroid  . Ringing in ears, bilateral    "since ~ 06/2017" (03/07/2018)  . Sinus drainage    Past Surgical History:  Procedure Laterality Date  .  APPENDECTOMY    . BREAST LUMPECTOMY W/ NEEDLE LOCALIZATION Left 10/05/2010   lumpectomy  . BREAST SURGERY    . CORONARY STENT INTERVENTION N/A 03/07/2018   Procedure: CORONARY STENT INTERVENTION;  Surgeon: Troy Sine, MD;  Location: [facility];  Service: Cardiovascular;  Laterality: N/A;  . LEFT HEART CATH AND CORONARY ANGIOGRAPHY N/A 03/07/2018   Procedure: LEFT HEART CATH AND CORONARY ANGIOGRAPHY;  Surgeon: Troy Sine, MD;  Location: [facility];  Service: Cardiovascular;  Laterality: N/A;  . MASTECTOMY Right 01/26/2011  . MASTECTOMY COMPLETE / SIMPLE W/ SENTINEL NODE BIOPSY Left 01/26/2011  . OVARIAN CYST SURGERY  1997, 2001, 2008   Allergies  Allergen Reactions  . Elavil [Amitriptyline Hcl] Rash  . Sulfur Rash   Prior to Admission medications   Medication Sig Start Date End Date Taking? Authorizing Provider  albuterol (PROVENTIL HFA;VENTOLIN HFA) 108 (90 Base) MCG/ACT inhaler Inhale 1-2 puffs into the lungs every 4 (four) hours as needed for wheezing or shortness of breath. 05/16/18  Yes [name], MD  amLODipine (NORVASC) 5 MG tablet Take 1 tablet (5 mg total) by mouth daily. 03/11/18  Yes Dunn, Nedra Hai, PA-C  aspirin EC 81 MG tablet Take 1 tablet (81 mg total) by mouth daily. 02/27/18  Yes Troy Sine, MD  atorvastatin (LIPITOR) 80 MG tablet Take 1 tablet (80 mg total) by mouth daily at 6 PM. 03/08/18  Yes Kroeger, Lorelee Cover., PA-C  cetirizine (ZYRTEC) 10 MG tablet Take 10 mg by mouth daily as needed for allergies.    Yes [provider]  FLUoxetine (PROZAC) 20 MG tablet Take 2 tablets (40 mg total) by mouth daily. 04/26/18  Yes [name], MD  fluticasone (FLONASE) 50 MCG/ACT nasal spray Place 2 sprays into both nostrils daily. 06/17/16  Yes [name], MD  guaiFENesin (MUCINEX) 600 MG 12 hr tablet Take 600 mg by mouth 2 (two) times daily as needed.   Yes [provider]  hydrOXYzine (ATARAX/VISTARIL) 25 MG tablet Take  0.5-1 tablets (12.5-25 mg total) by mouth 3 (three) times daily as needed for anxiety. 04/26/18  Yes [name], MD  metoprolol succinate (TOPROL XL) 25 MG 24 hr tablet Take 0.5 tablets (12.5 mg total) by mouth daily. 03/10/18  Yes Dunn, Nedra Hai, PA-C  ticagrelor (BRILINTA) 90 MG TABS tablet Take 1 tablet (90 mg total) by mouth 2 (two) times daily. 03/08/18  Yes Kroeger, Daleen Snook M., PA-C  clonazePAM (KLONOPIN) 0.5 MG tablet Take 1 tablet (0.5  mg total) by mouth 2 (two) times daily as needed. Patient not taking: Reported on 05/30/2018 01/24/18   [name], MD  HYDROcodone-acetaminophen (NORCO/VICODIN) 5-325 MG tablet Take 1 tablet by mouth every 6 (six) hours as needed for moderate pain. Patient not taking: Reported on 05/30/2018 04/14/18   [name], MD  meclizine (ANTIVERT) 25 MG tablet Take 1 tablet (25 mg total) by mouth 3 (three) times daily as needed for dizziness. Patient not taking: Reported on 05/30/2018 01/24/18   [name], MD  nitroGLYCERIN (NITROSTAT) 0.4 MG SL tablet Place 1 tablet (0.4 mg total) under the tongue every 5 (five) minutes as needed for chest pain. 02/27/18 05/28/18  Troy Sine, MD  ondansetron (ZOFRAN ODT) 4 MG disintegrating tablet Take 1 tablet (4 mg total) by mouth every 8 (eight) hours as needed for nausea or vomiting. Patient not taking: Reported on 05/30/2018 04/14/18   [name], MD  Rotigotine (NEUPRO) 1 MG/24HR PT24 Place 1 patch (1 mg total) onto the skin at bedtime as needed. May remove in the morning. Patient not taking: Reported on 05/30/2018 04/05/18   Melvenia Beam, MD   Social History   Socioeconomic History  . Marital status: Divorced    Spouse name: Not on file  . Number of children: Not on file  . Years of education: Not on file  . Highest education level: Bachelor's degree (e.g., BA, AB, BS)  Occupational History  . Not on file  Social Needs  . Financial resource strain: Not on file  . Food insecurity:     Worry: Not on file    Inability: Not on file  . Transportation needs:    Medical: Not on file    Non-medical: Not on file  Tobacco Use  . Smoking status: Never Smoker  . Smokeless tobacco: Never Used  Substance and Sexual Activity  . Alcohol use: Yes    Comment: 03/07/2018 "probably once/month"  . Drug use: No  . Sexual activity: Not on file  Lifestyle  . Physical activity:    Days per week: Not on file    Minutes per session: Not on file  . Stress: Not on file  Relationships  . Social connections:    Talks on phone: Not on file    Gets together: Not on file    Attends religious service: Not on file    Active member of club or organization: Not on file    Attends meetings of clubs or organizations: Not on file    Relationship status: Not on file  . Intimate partner violence:    Fear of current or ex partner: Not on file    Emotionally abused: Not on file    Physically abused: Not on file    Forced sexual activity: Not on file  Other Topics Concern  . Not on file  Social History Narrative   Lives at home alone   Right handed   Caffeine: 1/2 cup of coffee daily that has (1/2 & 1/2 caf)    Review of Systems As above.     Objective:   Physical Exam  Constitutional: She is oriented to person, place, and time. She appears well-developed and well-nourished. No distress.  HENT:  Head: Normocephalic and atraumatic.  Cardiovascular: Normal rate.  Pulmonary/Chest: Effort normal.  Neurological: She is alert and oriented to person, place, and time.  Psychiatric: Her speech is normal and behavior is normal. Thought content normal.  Somewhat flat effect, good eye  contact, appropriate response.     Vitals:   05/30/18 1624  BP: 118/66  Pulse: 60  Temp: 98 F (36.7 C)  TempSrc: Oral  SpO2: 97%  Weight: 146 lb 12.8 oz (66.6 kg)  Height: _0  (1.575 m)       Assessment & Plan:   [name] is a 67 y.o. female Adjustment disorder with mixed anxiety and depressed  mood  Insomnia, unspecified type - Plan: traZODone (DESYREL) 50 MG tablet Anxiety with secondary adjustment disorder likely from recent medical issues along with stressors from work.  See prior visits.  Did have some improvement with meeting with therapist, but still difficulty with plan for return to work.  Suspect she may require some more time.    - Continue follow-up with therapist as planned, continue same dose Prozac for now.    - Trial of trazodone low-dose to help with sleep and may also help with some depression symptoms.   - Recheck 1 month, sooner if needed.  Note provided for work.    Meds ordered this encounter  Medications  . traZODone (DESYREL) 50 MG tablet    Sig: Take 0.5-1 tablets (25-50 mg total) by mouth at bedtime as needed for sleep.    Dispense:  30 tablet    Refill:  1   Patient Instructions     Try trazodone 1/2 to 1 pill at bedtime as needed.  That may help with sleep as well as depression symptoms.  Continue Prozac same dose.  Continue follow-up with therapist.  Follow-up in 1 month, but if you are ready to try returning to work short-term prior to that time, let me know.  Return to the clinic or go to the nearest emergency room if any of your symptoms worsen or new symptoms occur.   If you have lab work done today you will be contacted with your lab results within the next 2 weeks.  If you have not heard from [place] then please contact us. The fastest way to get your results is to register for My Chart.   IF you received an x-ray today, you will receive an invoice from [facility]. Please contact [facility] at [phone] with questions or concerns regarding your invoice.   IF you received labwork today, you will receive an invoice from [place]. Please contact [facility] at [phone] with questions or concerns regarding your invoice.   Our billing staff will not be able to assist you with questions regarding bills from these  companies.  You will be contacted with the lab results as soon as they are available. The fastest way to get your results is to activate your My Chart account. Instructions are located on the last page of this paperwork. If you have not heard from [place] regarding the results in 2 weeks, please contact this office.      Signed,   [name], MD [facility] [place].  05/30/18 5:50 PM

## 2018-05-30 NOTE — Patient Instructions (Addendum)
   Try trazodone 1/2 to 1 pill at bedtime as needed.  That may help with sleep as well as depression symptoms.  Continue Prozac same dose.  Continue follow-up with therapist.  Follow-up in 1 month, but if you are ready to try returning to work short-term prior to that time, let me know.  Return to the clinic or go to the nearest emergency room if any of your symptoms worsen or new symptoms occur.   If you have lab work done today you will be contacted with your lab results within the next 2 weeks.  If you have not heard from [place] then please contact us. The fastest way to get your results is to register for My Chart.   IF you received an x-ray today, you will receive an invoice from [facility]. Please contact [facility] at [phone] with questions or concerns regarding your invoice.   IF you received labwork today, you will receive an invoice from [place]. Please contact [facility] at [phone] with questions or concerns regarding your invoice.   Our billing staff will not be able to assist you with questions regarding bills from these companies.  You will be contacted with the lab results as soon as they are available. The fastest way to get your results is to activate your My Chart account. Instructions are located on the last page of this paperwork. If you have not heard from [place] regarding the results in 2 weeks, please contact this office.

## 2018-05-31 ENCOUNTER — Ambulatory Visit (HOSPITAL_COMMUNITY): Payer: PRIVATE HEALTH INSURANCE

## 2018-05-31 ENCOUNTER — Encounter (HOSPITAL_COMMUNITY)
Admission: RE | Admit: 2018-05-31 | Discharge: 2018-05-31 | Disposition: A | Discharge status: Home / Self Care | Payer: PRIVATE HEALTH INSURANCE | Source: Ambulatory Visit | Attending: Cardiovascular Disease | Admitting: Cardiovascular Disease

## 2018-05-31 DIAGNOSIS — I2510 Atherosclerotic heart disease of native coronary artery without angina pectoris: Secondary | ICD-10-CM | POA: Diagnosis not present

## 2018-05-31 DIAGNOSIS — Z955 Presence of coronary angioplasty implant and graft: Secondary | ICD-10-CM

## 2018-06-02 ENCOUNTER — Encounter (HOSPITAL_COMMUNITY)
Admission: RE | Admit: 2018-06-02 | Discharge: 2018-06-02 | Disposition: A | Discharge status: Home / Self Care | Payer: PRIVATE HEALTH INSURANCE | Source: Ambulatory Visit | Attending: Cardiovascular Disease | Admitting: Cardiovascular Disease

## 2018-06-02 ENCOUNTER — Telehealth: Payer: Self-pay | Admitting: Family Medicine

## 2018-06-02 DIAGNOSIS — Z955 Presence of coronary angioplasty implant and graft: Secondary | ICD-10-CM

## 2018-06-02 DIAGNOSIS — I2510 Atherosclerotic heart disease of native coronary artery without angina pectoris: Secondary | ICD-10-CM | POA: Diagnosis not present

## 2018-06-02 NOTE — Telephone Encounter (Signed)
Patient came in to drop off disability paperwork. Pt states that she has already paid the $15 fee. Placed in Caitlin's box.

## 2018-06-05 ENCOUNTER — Ambulatory Visit (HOSPITAL_COMMUNITY): Payer: PRIVATE HEALTH INSURANCE

## 2018-06-05 ENCOUNTER — Encounter (HOSPITAL_COMMUNITY)
Admission: RE | Admit: 2018-06-05 | Discharge: 2018-06-05 | Disposition: A | Discharge status: Home / Self Care | Payer: PRIVATE HEALTH INSURANCE | Source: Ambulatory Visit | Attending: Cardiovascular Disease | Admitting: Cardiovascular Disease

## 2018-06-05 DIAGNOSIS — I2510 Atherosclerotic heart disease of native coronary artery without angina pectoris: Secondary | ICD-10-CM | POA: Diagnosis not present

## 2018-06-05 DIAGNOSIS — Z955 Presence of coronary angioplasty implant and graft: Secondary | ICD-10-CM

## 2018-06-06 NOTE — Telephone Encounter (Signed)
Paperwork and records were faxed on 06/06/18

## 2018-06-07 ENCOUNTER — Encounter (HOSPITAL_COMMUNITY)
Admission: RE | Admit: 2018-06-07 | Discharge: 2018-06-07 | Disposition: A | Discharge status: Home / Self Care | Payer: PRIVATE HEALTH INSURANCE | Source: Ambulatory Visit | Attending: Cardiovascular Disease | Admitting: Cardiovascular Disease

## 2018-06-07 ENCOUNTER — Ambulatory Visit (HOSPITAL_COMMUNITY): Payer: PRIVATE HEALTH INSURANCE

## 2018-06-07 DIAGNOSIS — I2510 Atherosclerotic heart disease of native coronary artery without angina pectoris: Secondary | ICD-10-CM | POA: Diagnosis not present

## 2018-06-07 DIAGNOSIS — Z955 Presence of coronary angioplasty implant and graft: Secondary | ICD-10-CM

## 2018-06-09 ENCOUNTER — Encounter (HOSPITAL_COMMUNITY)
Admission: RE | Admit: 2018-06-09 | Discharge: 2018-06-09 | Disposition: A | Discharge status: Home / Self Care | Payer: PRIVATE HEALTH INSURANCE | Source: Ambulatory Visit | Attending: Cardiovascular Disease | Admitting: Cardiovascular Disease

## 2018-06-09 DIAGNOSIS — Z955 Presence of coronary angioplasty implant and graft: Secondary | ICD-10-CM

## 2018-06-09 DIAGNOSIS — I2510 Atherosclerotic heart disease of native coronary artery without angina pectoris: Secondary | ICD-10-CM | POA: Diagnosis not present

## 2018-06-12 ENCOUNTER — Encounter (HOSPITAL_COMMUNITY)
Admission: RE | Admit: 2018-06-12 | Discharge: 2018-06-12 | Disposition: A | Discharge status: Home / Self Care | Payer: PRIVATE HEALTH INSURANCE | Source: Ambulatory Visit | Attending: Cardiovascular Disease | Admitting: Cardiovascular Disease

## 2018-06-12 ENCOUNTER — Ambulatory Visit (HOSPITAL_COMMUNITY): Payer: PRIVATE HEALTH INSURANCE

## 2018-06-12 DIAGNOSIS — I2510 Atherosclerotic heart disease of native coronary artery without angina pectoris: Secondary | ICD-10-CM | POA: Diagnosis not present

## 2018-06-12 DIAGNOSIS — Z955 Presence of coronary angioplasty implant and graft: Secondary | ICD-10-CM

## 2018-06-13 ENCOUNTER — Ambulatory Visit (INDEPENDENT_AMBULATORY_CARE_PROVIDER_SITE_OTHER): Payer: PRIVATE HEALTH INSURANCE | Admitting: Neurology

## 2018-06-13 ENCOUNTER — Encounter: Payer: Self-pay | Admitting: Neurology

## 2018-06-13 VITALS — BP 116/59 | HR 64 | Ht 62.0 in | Wt 147.0 lb

## 2018-06-13 DIAGNOSIS — D508 Other iron deficiency anemias: Secondary | ICD-10-CM | POA: Diagnosis not present

## 2018-06-13 DIAGNOSIS — F518 Other sleep disorders not due to a substance or known physiological condition: Secondary | ICD-10-CM

## 2018-06-13 DIAGNOSIS — R5383 Other fatigue: Secondary | ICD-10-CM | POA: Insufficient documentation

## 2018-06-13 DIAGNOSIS — F3289 Other specified depressive episodes: Secondary | ICD-10-CM

## 2018-06-13 DIAGNOSIS — G2581 Restless legs syndrome: Secondary | ICD-10-CM

## 2018-06-13 DIAGNOSIS — F329 Major depressive disorder, single episode, unspecified: Secondary | ICD-10-CM | POA: Insufficient documentation

## 2018-06-13 DIAGNOSIS — F515 Nightmare disorder: Secondary | ICD-10-CM | POA: Insufficient documentation

## 2018-06-13 DIAGNOSIS — D649 Anemia, unspecified: Secondary | ICD-10-CM | POA: Insufficient documentation

## 2018-06-13 DIAGNOSIS — F32A Depression, unspecified: Secondary | ICD-10-CM | POA: Insufficient documentation

## 2018-06-13 DIAGNOSIS — G4709 Other insomnia: Secondary | ICD-10-CM

## 2018-06-13 NOTE — Patient Instructions (Signed)
Sleep Study  [name] will be scheduled for a sleep study - a patient is arriving either at 8 or at 9 pm, and  will leave next morning between 5:00 a.m - 6:00 a.m.  The sleep study consists of a recording of your brain waves (EEG). Breathing, heart rate and rhythm (ECG), oxygen level, eye movement, and leg movement.  The technician will glue or or paste several electrodes to your scalp, face, chest and legs.  You will have belts around your chest and abdomen to record breathing and a finger clasp to check blood oxygen levels.  A tube at your mouth and nose will detect airflow.  There are no needle sticks or painful procedures of any sort.  You will have your own room, and we will make every effort to attend to your comfort and privacy.  Please prepare for your study by the following steps:   Please avoid coffee, tea, soda, chocolate and other caffeine foods or beverages after 12:00 noon on the day of your sleep study.   You must arrive with clean (no oils), conditioners or make up, and please make sure that you wash your hair to ensure that your hair and scalp are clean, dry and free of any hair extensions on the day of your study.  This will help to get a good reading of study.  Please try not to nap on the day of your study.  Please bring a list of all your medications.  Bring any medications that you might need during the time you are within the laboratory, including insulin, sleeping pills, pain medication and anxiety medications.  Bring snacks, water or juice  Please bring clothes to sleep in and your normal overnight bag.  Please leave valuable at home, as we will not be responsible for any lost items.  If you have any further questions, please feel free to call our office. Thank you

## 2018-06-13 NOTE — Progress Notes (Addendum)
**Note [name]** [facility]   Provider:  Larey Seat, M D  Primary Care Physician:  [name], MD   Referring Provider: Sarina Ill, MD    Chief Complaint  Patient presents with  . New Patient (Initial Visit)    pt alone, rm 10. pt states she doesnt sleep well and has vivid/active dreams which she almost always remembers. recently started the trazadone for couple weeks and feels it has been somewhat helpful. on average she may sleep 4 hours. pt has been told that she snores in sleep.complains of RLS. has some pain in legs    HPI:  [name] is a 67 y.o. female patient, seen here as in a referral  from Dr. Jaynee Eagles for a sleep evaluation on 06-13-2018.   Chief complaint according to patient : I have the pleasure of meeting [name] today on 13 June 3741, a 67 year old Caucasian female patient that is still gainfully employed.  The patient reports as her main sleep concern visit, lucid dreams and and daytime fatigue.  There has been a lot of medical changes over the last 4 months the patient suffered subacute onset of peripheral vertigo was seen by her primary care physician and sent to the ophthalmologist Dr. Carolynn Sayers because of diplopia.  The double vision occurred with prisms , there were no headaches associated and the symptoms have meanwhile improved.  she also had a lower jaw and tenderness over her upper back, was evaluated and diagnosed with CAD, balloon angioplasty an stent placement followed. Breast cancer survivor - mastectomy in 2012.   Sleep habits are as follows: Dinner at 7 pm. Bedtime is 10 pm but she watches TV in bed, puts the TV on a 60 minute sleep timer. She takes clonazepam- prescriber is Dr. Carlota Raspberry. She goes to sleep promptly but likes the sound-scape of  "company". Bedroom is after midnight cool, quiet and dark. She cannot sleep long, feels that her vivid dreams interrupt her or that she dreams she is awake. She wakes up after 2-4 hours . Nocturia  times once at 4 am -She had dreams that she was on the commode and had enuresis. She changed to trazodone.  She sleeps in any position, on an adjustable bed - 15-30 degrees- uses one pillow for head support, sleeps often prone since her mastectomy. She relies on an alarm, rings at 7.30- she likes to snooze a little longer. She puts on the TV. She breakfasts at work, coffee and granola bar.    Medical history and family sleep history: Sister had just tested positive for OSA in [facility] , no  insomnia, narcolepsy.    Social history: [place] native - Single, alone. No pet, no children, non smoker- no vapor use. Caffeine  - one coffee in AM and one coke at work , or one glass of iced tea when out for lunch or dinner. Education- Forensic psychologist, music major. Works at  [employer].  Not a member of the [employer].    Review of Systems: Out of a complete 14 system review, the patient complains of only the following symptoms, and all other reviewed systems are negative. Hypersomnia at work - sleepy, extremely fatigued- currently out of work since CAD diagnosed and stent placed, depressed after the dx - now on short term disability, sleeping better. RLS , never used patch, no oral medication. Untreated.   Throat is dry mouth is dry, she could be snoring, gasping for air, woken  herself.   Epworth Sleepiness score : 5/ 24  , Fatigue severity score: 33/ 63 points   , depression score : 3/15    Social History   Socioeconomic History  . Marital status: Divorced    Spouse name: Not on file  . Number of children: Not on file  . Years of education: Not on file  . Highest education level: Bachelor's degree (e.g., BA, AB, BS)  Occupational History  . Not on file  Social Needs  . Financial resource strain: Not on file  . Food insecurity:    Worry: Not on file    Inability: Not on file  . Transportation needs:    Medical: Not on file    Non-medical: Not on file  Tobacco Use   . Smoking status: Never Smoker  . Smokeless tobacco: Never Used  Substance and Sexual Activity  . Alcohol use: Yes    Comment: 03/07/2018 "probably once/month"  . Drug use: No  . Sexual activity: Not on file  Lifestyle  . Physical activity:    Days per week: Not on file    Minutes per session: Not on file  . Stress: Not on file  Relationships  . Social connections:    Talks on phone: Not on file    Gets together: Not on file    Attends religious service: Not on file    Active member of club or organization: Not on file    Attends meetings of clubs or organizations: Not on file    Relationship status: Not on file  . Intimate partner violence:    Fear of current or ex partner: Not on file    Emotionally abused: Not on file    Physically abused: Not on file    Forced sexual activity: Not on file  Other Topics Concern  . Not on file  Social History Narrative   Lives at home alone   Right handed   Caffeine: 1/2 cup of coffee daily that has (1/2 & 1/2 caf)    Family History  Problem Relation Age of Onset  . Hypertension Mother   . Heart Problems Mother   . Hyperlipidemia Mother   . Hypotension Father   . Other Father        pacemaker  . Heart failure Paternal Grandmother   . Heart failure Paternal Grandfather   . Cancer Brother   . Cancer Sister   . Diabetes Sister    Notes recorded by Melvenia Beam, MD on 04/06/2018 at 10:01 AM EDT Ferritin level is very low 30. In people with Restless Leg Syndrome we want it greater than 75. Taking iron supplement may significantly improve RLS. Studies have shown that in patients whose serum ferritin was < 75 g/l, oral iron therapy (325 mg ferrous sulfate twice a day on an empty stomach) on average improved RLS symptom after 3 months. Taking it with Vitamin C may help absorption and you can buy it in the store with vitamin c included in the pill. I recommend starting this.   Past Medical History:  Diagnosis Date  . Allergy     "dust mite residue, mold, mildew, grapefruit, chili peppers, cat dander, some weeds" (03/07/2018)  . Anxiety   . Arthritis    "minor; right knee, left hip" (03/07/2018)  . Bruises easily   . Cancer of left breast ([place])    double mastectomy 01/26/2011  . Childhood asthma   . Contact lens/glasses fitting   . Coronary artery disease  a.  She underwent LHC 7/23 which revealed 95% stenosis of proximal LAD, managed with PCI/DES.   . Depression   . Essential hypertension   . GERD (gastroesophageal reflux disease)    "at times" (03/07/2018)  . Heart murmur   . Hiatal hernia   . High cholesterol   . History of kidney stones    "lots; no OR" (03/07/2018)  . Osteoporosis   . Rectal bleeding    hemorroid  . Ringing in ears, bilateral    "since ~ 06/2017" (03/07/2018)  . Sinus drainage     Past Surgical History:  Procedure Laterality Date  . APPENDECTOMY    . BREAST LUMPECTOMY W/ NEEDLE LOCALIZATION Left 10/05/2010   lumpectomy  . BREAST SURGERY    . CORONARY STENT INTERVENTION N/A 03/07/2018   Procedure: CORONARY STENT INTERVENTION;  Surgeon: Troy Sine, MD;  Location: [facility];  Service: Cardiovascular;  Laterality: N/A;  . LEFT HEART CATH AND CORONARY ANGIOGRAPHY N/A 03/07/2018   Procedure: LEFT HEART CATH AND CORONARY ANGIOGRAPHY;  Surgeon: Troy Sine, MD;  Location: [facility];  Service: Cardiovascular;  Laterality: N/A;  . MASTECTOMY Right 01/26/2011  . MASTECTOMY COMPLETE / SIMPLE W/ SENTINEL NODE BIOPSY Left 01/26/2011  . OVARIAN CYST SURGERY  1997, 2001, 2008    Current Outpatient Medications  Medication Sig Dispense Refill  . albuterol (PROVENTIL HFA;VENTOLIN HFA) 108 (90 Base) MCG/ACT inhaler Inhale 1-2 puffs into the lungs every 4 (four) hours as needed for wheezing or shortness of breath. 1 Inhaler 0  . amLODipine (NORVASC) 5 MG tablet Take 1 tablet (5 mg total) by mouth daily. 30 tablet 6  . aspirin EC 81 MG tablet Take 1 tablet (81 mg total) by  mouth daily. 90 tablet 3  . atorvastatin (LIPITOR) 80 MG tablet Take 1 tablet (80 mg total) by mouth daily at 6 PM. 90 tablet 3  . cetirizine (ZYRTEC) 10 MG tablet Take 10 mg by mouth daily as needed for allergies.     Marland Kitchen FLUoxetine (PROZAC) 20 MG tablet Take 2 tablets (40 mg total) by mouth daily. 180 tablet 1  . fluticasone (FLONASE) 50 MCG/ACT nasal spray Place 2 sprays into both nostrils daily. 16 g 6  . guaiFENesin (MUCINEX) 600 MG 12 hr tablet Take 600 mg by mouth 2 (two) times daily as needed.    . meclizine (ANTIVERT) 25 MG tablet Take 1 tablet (25 mg total) by mouth 3 (three) times daily as needed for dizziness. 30 tablet 0  . metoprolol succinate (TOPROL XL) 25 MG 24 hr tablet Take 0.5 tablets (12.5 mg total) by mouth daily. 30 tablet 6  . ticagrelor (BRILINTA) 90 MG TABS tablet Take 1 tablet (90 mg total) by mouth 2 (two) times daily. 180 tablet 3  . traZODone (DESYREL) 50 MG tablet Take 0.5-1 tablets (25-50 mg total) by mouth at bedtime as needed for sleep. 30 tablet 1  . clonazePAM (KLONOPIN) 0.5 MG tablet Take 1 tablet (0.5 mg total) by mouth 2 (two) times daily as needed. (Patient not taking: Reported on 05/30/2018) 30 tablet 1  . HYDROcodone-acetaminophen (NORCO/VICODIN) 5-325 MG tablet Take 1 tablet by mouth every 6 (six) hours as needed for moderate pain. (Patient not taking: Reported on 05/30/2018) 15 tablet 0  . nitroGLYCERIN (NITROSTAT) 0.4 MG SL tablet Place 1 tablet (0.4 mg total) under the tongue every 5 (five) minutes as needed for chest pain. 25 tablet 3  . ondansetron (ZOFRAN ODT) 4 MG disintegrating tablet  Take 1 tablet (4 mg total) by mouth every 8 (eight) hours as needed for nausea or vomiting. (Patient not taking: Reported on 05/30/2018) 10 tablet 0  . Rotigotine (NEUPRO) 1 MG/24HR PT24 Place 1 patch (1 mg total) onto the skin at bedtime as needed. May remove in the morning. (Patient not taking: Reported on 05/30/2018) 30 patch 0   No current facility-administered  medications for this visit.     Allergies as of 06/13/2018 - Review Complete 06/13/2018  Allergen Reaction Noted  . Elavil [amitriptyline hcl] Rash 02/09/2011  . Sulfur Rash 02/09/2011    Vitals: BP (!) 116/59   Pulse 64   Ht 5\' 2"  (1.575 m)   Wt 147 lb (66.7 kg)   LMP 09/10/2006   BMI 26.89 kg/m  Last Weight:  Wt Readings from Last 1 Encounters:  06/13/18 147 lb (66.7 kg)   SAY:TKZS mass index is 26.89 kg/m.     Last Height:   Ht Readings from Last 1 Encounters:  06/13/18 5\' 2"  (1.575 m)    Physical exam:  General: The patient is awake, alert and appears not in acute distress. The patient is well groomed. Head: Normocephalic, atraumatic. Neck is supple. Mallampati: 4.  neck circumference:14" . Nasal airflow patent ,  Retrognathia is not seen.  Cardiovascular:  Regular rate and rhythm , without  murmurs or carotid bruit, and without distended neck veins. Respiratory: Lungs are clear to auscultation. Skin:  Without evidence of edema, or rash Trunk: BMI is 27. The patient's posture is erect.  Neurologic exam : The patient is awake and alert, oriented to place and time.   Memory subjective described as intact.  Attention span & concentration ability appears normal.  Speech is fluent,  without dysarthria, dysphonia or aphasia.  Mood and affect are giddy, talkative.   Cranial nerves: Pupils are equal and briskly reactive to light. Funduscopic exam without  evidence of pallor or edema- beginning of a cloudy lens. Extraocular movements  in vertical and horizontal planes intact and without nystagmus. Visual fields by finger perimetry are intact. Hearing to finger rub intact.  Facial sensation intact to fine touch. Facial motor strength is symmetric and tongue and uvula move midline. Shoulder shrug was symmetrical.  Motor exam: Normal tone, muscle bulk and symmetric strength in all extremities. Sensory:  Fine touch, pinprick and vibration were tested in all extremities.  Proprioception tested in the upper extremities was normal. Coordination: Rapid alternating movements in the fingers/hands /Finger-to-nose maneuver normal without evidence of ataxia, dysmetria or tremor. Gait and station: Patient walks without assistive device and is able unassisted to climb up to the exam table. Strength within normal limits. Stance is stable and normal.  Toe stand tested .Tandem gait is unfragmented. Turns with 3  Steps. Romberg testing is negative. Deep tendon reflexes: in the  upper and lower extremities are symmetric and intact.     Assessment:  After physical and neurologic examination, review of laboratory studies,  Personal review of imaging studies, reports of other /same  Imaging studies, results of polysomnography and / or neurophysiology testing and pre-existing records as far as provided in visit., my assessment is   1)  RLS - untreated. She has not been worked up , but has pale mucous membranes, may be iron deficient or anemic.   2)  Snoring, and likely some apnea, in context with poor sleep quality , waking up gasping for air, with a dry mouth and dry throat-   3)  Degree of fatigue  and daytime sleepiness was excessive before and shortly after her CAD diagnosis, may be depression.    The patient was advised of the nature of the diagnosed disorder , the treatment options and the  risks for general health and wellness arising from not treating the condition.   I spent more than 45 minutes of face to face time with the patient.  Greater than 50% of time was spent in counseling and coordination of care. We have discussed the diagnosis and differential and I answered the patient's questions.    Plan:  Treatment plan and additional workup :  Attended sleep study , vivid dreams , high fatigue, and possible apnea or anemia - RLS.   Order PSG with REM BD.    Larey Seat, MD 93/90/3009, 2:33 PM  Certified in Neurology by ABPN Certified in [place] by  [facility] [address], [place], [place] [address]

## 2018-06-14 ENCOUNTER — Encounter (HOSPITAL_COMMUNITY)
Admission: RE | Admit: 2018-06-14 | Discharge: 2018-06-14 | Disposition: A | Discharge status: Home / Self Care | Payer: PRIVATE HEALTH INSURANCE | Source: Ambulatory Visit | Attending: Cardiovascular Disease | Admitting: Cardiovascular Disease

## 2018-06-14 ENCOUNTER — Ambulatory Visit (HOSPITAL_COMMUNITY): Payer: PRIVATE HEALTH INSURANCE

## 2018-06-14 DIAGNOSIS — Z955 Presence of coronary angioplasty implant and graft: Secondary | ICD-10-CM

## 2018-06-14 DIAGNOSIS — I2510 Atherosclerotic heart disease of native coronary artery without angina pectoris: Secondary | ICD-10-CM | POA: Diagnosis not present

## 2018-06-15 NOTE — Progress Notes (Signed)
Cardiac Individual Treatment Plan  Patient Details  Name: [name] MRN: [id number] Date of Birth: March 27, 1951 Referring Provider:   Flowsheet Row CARDIAC REHAB PHASE II ORIENTATION from 05/04/2018 in [place]  Referring Provider  Troy Sine MD       Initial Encounter Date:  [place] PHASE II ORIENTATION from 05/04/2018 in [place]  Date  05/04/18      Visit Diagnosis: 07/23/2019Stented coronary artery  Patient's Home Medications on Admission:  Current Outpatient Medications:  .  albuterol (PROVENTIL HFA;VENTOLIN HFA) 108 (90 Base) MCG/ACT inhaler, Inhale 1-2 puffs into the lungs every 4 (four) hours as needed for wheezing or shortness of breath., Disp: 1 Inhaler, Rfl: 0 .  amLODipine (NORVASC) 5 MG tablet, Take 1 tablet (5 mg total) by mouth daily., Disp: 30 tablet, Rfl: 6 .  aspirin EC 81 MG tablet, Take 1 tablet (81 mg total) by mouth daily., Disp: 90 tablet, Rfl: 3 .  atorvastatin (LIPITOR) 80 MG tablet, Take 1 tablet (80 mg total) by mouth daily at 6 PM., Disp: 90 tablet, Rfl: 3 .  cetirizine (ZYRTEC) 10 MG tablet, Take 10 mg by mouth daily as needed for allergies. , Disp: , Rfl:  .  clonazePAM (KLONOPIN) 0.5 MG tablet, Take 1 tablet (0.5 mg total) by mouth 2 (two) times daily as needed. (Patient not taking: Reported on 05/30/2018), Disp: 30 tablet, Rfl: 1 .  FLUoxetine (PROZAC) 20 MG tablet, Take 2 tablets (40 mg total) by mouth daily., Disp: 180 tablet, Rfl: 1 .  fluticasone (FLONASE) 50 MCG/ACT nasal spray, Place 2 sprays into both nostrils daily., Disp: 16 g, Rfl: 6 .  guaiFENesin (MUCINEX) 600 MG 12 hr tablet, Take 600 mg by mouth 2 (two) times daily as needed., Disp: , Rfl:  .  HYDROcodone-acetaminophen (NORCO/VICODIN) 5-325 MG tablet, Take 1 tablet by mouth every 6 (six) hours as needed for moderate pain. (Patient not taking: Reported on 05/30/2018), Disp: 15 tablet, Rfl: 0 .   meclizine (ANTIVERT) 25 MG tablet, Take 1 tablet (25 mg total) by mouth 3 (three) times daily as needed for dizziness., Disp: 30 tablet, Rfl: 0 .  metoprolol succinate (TOPROL XL) 25 MG 24 hr tablet, Take 0.5 tablets (12.5 mg total) by mouth daily., Disp: 30 tablet, Rfl: 6 .  nitroGLYCERIN (NITROSTAT) 0.4 MG SL tablet, Place 1 tablet (0.4 mg total) under the tongue every 5 (five) minutes as needed for chest pain., Disp: 25 tablet, Rfl: 3 .  ondansetron (ZOFRAN ODT) 4 MG disintegrating tablet, Take 1 tablet (4 mg total) by mouth every 8 (eight) hours as needed for nausea or vomiting. (Patient not taking: Reported on 05/30/2018), Disp: 10 tablet, Rfl: 0 .  Rotigotine (NEUPRO) 1 MG/24HR PT24, Place 1 patch (1 mg total) onto the skin at bedtime as needed. May remove in the morning. (Patient not taking: Reported on 05/30/2018), Disp: 30 patch, Rfl: 0 .  ticagrelor (BRILINTA) 90 MG TABS tablet, Take 1 tablet (90 mg total) by mouth 2 (two) times daily., Disp: 180 tablet, Rfl: 3 .  traZODone (DESYREL) 50 MG tablet, Take 0.5-1 tablets (25-50 mg total) by mouth at bedtime as needed for sleep., Disp: 30 tablet, Rfl: 1  Past Medical History: Past Medical History:  Diagnosis Date  . Allergy    "dust mite residue, mold, mildew, grapefruit, chili peppers, cat dander, some weeds" (03/07/2018)  . Anxiety   . Arthritis    "minor; right knee,  left hip" (03/07/2018)  . Bruises easily   . Cancer of left breast ([place])    double mastectomy 01/26/2011  . Childhood asthma   . Contact lens/glasses fitting   . Coronary artery disease    a.  She underwent LHC 7/23 which revealed 95% stenosis of proximal LAD, managed with PCI/DES.   . Depression   . Essential hypertension   . GERD (gastroesophageal reflux disease)    "at times" (03/07/2018)  . Heart murmur   . Hiatal hernia   . High cholesterol   . History of kidney stones    "lots; no OR" (03/07/2018)  . Osteoporosis   . Rectal bleeding    hemorroid  . Ringing in  ears, bilateral    "since ~ 06/2017" (03/07/2018)  . Sinus drainage     Tobacco Use: Social History   Tobacco Use  Smoking Status Never Smoker  Smokeless Tobacco Never Used    Labs: Recent Review Flowsheet Data    Labs for ITP Cardiac and Pulmonary Rehab Latest Ref Rng & Units 05/21/2015 01/17/2016 07/22/2016 09/24/2017 03/06/2018   Cholestrol 100 - 199 mg/dL 247(H) 163 201(H) 249(H) 142   LDLCALC 0 - 99 mg/dL 147(H) 84 126(H) 174(H) 72   HDL >39 mg/dL 34(L) 42(L) 46 45 39(L)   Trlycerides 0 - 149 mg/dL 329(H) 184(H) 143 148 153(H)   Hemoglobin A1c 4.8 - 5.6 % 5.9(H) - 5.6 5.7(H) -   TCO2 0 - 100 mmol/L - - - - -      Capillary Blood Glucose: No results found for: GLUCAP   Exercise Target Goals: Exercise Program Goal: Individual exercise prescription set using results from initial 6 min walk test and THRR while considering  patient's activity barriers and safety.   Exercise Prescription Goal: Initial exercise prescription builds to 30-45 minutes a day of aerobic activity, 2-3 days per week.  Home exercise guidelines will be given to patient during program as part of exercise prescription that the participant will acknowledge.  Activity Barriers & Risk Stratification: Activity Barriers & Cardiac Risk Stratification - 05/04/18 1103    Activity Barriers & Cardiac Risk Stratification          Activity Barriers  Deconditioning    Cardiac Risk Stratification  Moderate           6 Minute Walk: 6 Minute Walk    6 Minute Walk    Row Name 05/04/18 1101   Phase  Initial   Distance  1600 feet   Walk Time  6 minutes   # of Rest Breaks  0   MPH  3.03   METS  3.39   RPE  12   Perceived Dyspnea   0   VO2 Peak  11.87   Symptoms  No   Resting HR  67 bpm   Resting BP  108/60   Resting Oxygen Saturation   98 %   Exercise Oxygen Saturation  during 6 min walk  98 %   Max Ex. HR  90 bpm   Max Ex. BP  118/60   2 Minute Post BP  98/60          Oxygen Initial  Assessment:   Oxygen Re-Evaluation:   Oxygen Discharge (Final Oxygen Re-Evaluation):   Initial Exercise Prescription: Initial Exercise Prescription - 05/04/18 1100    Date of Initial Exercise RX and Referring Provider          Date  05/04/18    Referring Provider  Troy Sine  MD     Expected Discharge Date  08/02/18        Recumbant Bike          Level  1.5    Watts  25    Minutes  10    METs  3.21        NuStep          Level  2    SPM  75    Minutes  10    METs  3        Track          Laps  12    Minutes  10    METs  3.07        Prescription Details          Frequency (times per week)  3x    Duration  Progress to 30 minutes of continuous aerobic without signs/symptoms of physical distress        Intensity          THRR 40-80% of Max Heartrate  62-123    Ratings of Perceived Exertion  11-13    Perceived Dyspnea  0-4        Progression          Progression  Continue progressive overload as per policy without signs/symptoms or physical distress.        Resistance Training          Training Prescription  Yes    Weight  3lbs    Reps  10-15           Perform Capillary Blood Glucose checks as needed.  Exercise Prescription Changes: Exercise Prescription Changes    Response to Exercise    Row Name 05/10/18 1326 05/15/18 1324 05/29/18 1134 06/14/18 1548   Blood Pressure (Admit)  108/60  114/58  110/62  104/56   Blood Pressure (Exercise)  120/60  138/60  110/60  118/68   Blood Pressure (Exit)  100/58  110/52  100/62  98/60   Heart Rate (Admit)  75 bpm  67 bpm  94 bpm  72 bpm   Heart Rate (Exercise)  119 bpm  97 bpm  102 bpm  107 bpm   Heart Rate (Exit)  72 bpm  67 bpm  64 bpm  65 bpm   Rating of Perceived Exertion (Exercise)  12  11  12  12    Symptoms  none  none  none  none   Duration  Progress to 30 minutes of  aerobic without signs/symptoms of physical distress  Progress to 30 minutes of  aerobic without signs/symptoms of physical  distress  Progress to 30 minutes of  aerobic without signs/symptoms of physical distress  Progress to 30 minutes of  aerobic without signs/symptoms of physical distress   Intensity  THRR unchanged  THRR unchanged  THRR unchanged  THRR unchanged       Progression    Row Name 05/10/18 1326 05/15/18 1324 05/29/18 1134 06/14/18 1548   Progression  Continue to progress workloads to maintain intensity without signs/symptoms of physical distress.  Continue to progress workloads to maintain intensity without signs/symptoms of physical distress.  Continue to progress workloads to maintain intensity without signs/symptoms of physical distress.  Continue to progress workloads to maintain intensity without signs/symptoms of physical distress.   Average METs  2.7  3  3.96  3.48       Resistance Training    Row Name 05/10/18 1326 05/15/18 1324 05/29/18 1134  06/14/18 1548   Training Prescription  No Relaxation day, no weights  Yes  Yes  No   Weight  no documentation  3lbs  3lbs  no documentation   Reps  no documentation  10-15  10-15  no documentation   Time  no documentation  10 Minutes  10 Minutes  no documentation       Interval Training    Row Name 05/10/18 1326 05/15/18 1324 05/29/18 1134 06/14/18 1548   Interval Training  No  No  No  No       Recumbant Bike    Row Name 05/10/18 1326 05/15/18 1324 05/29/18 1134 06/14/18 1548   Level  1.5  1.5  1.5  1.5   Watts  19  23  32  32   Minutes  10  10  10  10    METs  2.94  3.1  5.15  3.45       NuStep    Row Name 05/10/18 1326 05/15/18 1324 05/29/18 1134 06/14/18 1548   Level  2  3  4  4    SPM  75  75  85  85   Minutes  10  10  10  10    METs  2  2.2  3.3  3.4       Track    Row Name 05/10/18 1326 05/15/18 1324 05/29/18 1134 06/14/18 1548   Laps  13  16  14  15    Minutes  10  10  10  10    METs  3.26  3.79  3.44  3.6          Exercise Comments: Exercise Comments    Row Name 05/15/18 1358 05/19/18 1350 05/31/18 1621 06/14/18 1552    Exercise Comments  Reviewed METs and goals with patient.  Reviewed home exercise guidelines with patient.  Reviewed METs and goals with Pt.   Pt continues to progress with exercise and increase MET level with each session.       Exercise Goals and Review: Exercise Goals    Exercise Goals    Row Name 05/04/18 1103   Increase Physical Activity  Yes   Intervention  Provide advice, education, support and counseling about physical activity/exercise needs.;Develop an individualized exercise prescription for aerobic and resistive training based on initial evaluation findings, risk stratification, comorbidities and participant's personal goals.   Expected Outcomes  Short Term: Attend rehab on a regular basis to increase amount of physical activity.;Long Term: Exercising regularly at least 3-5 days a week.;Long Term: Add in home exercise to make exercise part of routine and to increase amount of physical activity.   Increase Strength and Stamina  Yes   Intervention  Provide advice, education, support and counseling about physical activity/exercise needs.;Develop an individualized exercise prescription for aerobic and resistive training based on initial evaluation findings, risk stratification, comorbidities and participant's personal goals.   Expected Outcomes  Short Term: Increase workloads from initial exercise prescription for resistance, speed, and METs.;Short Term: Perform resistance training exercises routinely during rehab and add in resistance training at home;Long Term: Improve cardiorespiratory fitness, muscular endurance and strength as measured by increased METs and functional capacity (6MWT)   Able to understand and use rate of perceived exertion (RPE) scale  Yes   Intervention  Provide education and explanation on how to use RPE scale   Expected Outcomes  Short Term: Able to use RPE daily in rehab to express subjective intensity level;Long Term:  Able to use RPE  to guide intensity level when  exercising independently   Knowledge and understanding of Target Heart Rate Range (THRR)  Yes   Intervention  Provide education and explanation of THRR including how the numbers were predicted and where they are located for reference   Expected Outcomes  Short Term: Able to state/look up THRR;Long Term: Able to use THRR to govern intensity when exercising independently;Short Term: Able to use daily as guideline for intensity in rehab   Able to check pulse independently  Yes   Intervention  Provide education and demonstration on how to check pulse in carotid and radial arteries.;Review the importance of being able to check your own pulse for safety during independent exercise   Expected Outcomes  Short Term: Able to explain why pulse checking is important during independent exercise;Long Term: Able to check pulse independently and accurately   Understanding of Exercise Prescription  Yes   Intervention  Provide education, explanation, and written materials on patient's individual exercise prescription   Expected Outcomes  Short Term: Able to explain program exercise prescription;Long Term: Able to explain home exercise prescription to exercise independently          Exercise Goals Re-Evaluation : Exercise Goals Re-Evaluation    Exercise Goal Re-Evaluation    Row Name 05/15/18 1358 05/19/18 1350 05/31/18 1620 06/14/18 1550   Exercise Goals Review  Able to understand and use rate of perceived exertion (RPE) scale;Increase Physical Activity  Able to understand and use rate of perceived exertion (RPE) scale;Increase Physical Activity;Understanding of Exercise Prescription;Knowledge and understanding of Target Heart Rate Range (THRR)  Increase Physical Activity;Increase Strength and Stamina;Able to check pulse independently;Knowledge and understanding of Target Heart Rate Range (THRR);Able to understand and use rate of perceived exertion (RPE) scale;Understanding of Exercise Prescription  Increase  Physical Activity;Increase Strength and Stamina;Able to check pulse independently;Knowledge and understanding of Target Heart Rate Range (THRR);Able to understand and use rate of perceived exertion (RPE) scale;Understanding of Exercise Prescription   Comments  Patient is walking 1 day in addition to exercise at cardiac rehab. Pt understands and is able to use RPE scale appropriately.  Reviewed home exercise guidelines with patient including THRR, RPE scale, endpoints for exercise. Pt is walking as her mode of home exercise.  Reviewed METs and goals with Pt. Will continue to walk 30 minutes per day in addition to CR exercise.   Pt is tolerating exercise well. Continues to increase MET level. Continues to exercise at home in addition to Cardiac Rehab.    Expected Outcomes  Increase workloads as tolerated to help increase energy and stamina.  Pt will increase walking to 2-3 days/week at home in addition to exercise at cardiac rehab.  Will continue to monitor and progress Pt as tolerated.   Will continue to monitor and progress Pt as tolerated.           Discharge Exercise Prescription (Final Exercise Prescription Changes): Exercise Prescription Changes - 06/14/18 1548    Response to Exercise          Blood Pressure (Admit)  104/56    Blood Pressure (Exercise)  118/68    Blood Pressure (Exit)  98/60    Heart Rate (Admit)  72 bpm    Heart Rate (Exercise)  107 bpm    Heart Rate (Exit)  65 bpm    Rating of Perceived Exertion (Exercise)  12    Symptoms  none    Duration  Progress to 30 minutes of  aerobic without signs/symptoms of physical distress  Intensity  THRR unchanged        Progression          Progression  Continue to progress workloads to maintain intensity without signs/symptoms of physical distress.    Average METs  3.48        Resistance Training          Training Prescription  No        Interval Training          Interval Training  No        Recumbant Bike           Level  1.5    Watts  32    Minutes  10    METs  3.45        NuStep          Level  4    SPM  85    Minutes  10    METs  3.4        Track          Laps  15    Minutes  10    METs  3.6           Nutrition:  Target Goals: Understanding of nutrition guidelines, daily intake of sodium <1569m, cholesterol <2026m calories 30% from fat and 7% or less from saturated fats, daily to have 5 or more servings of fruits and vegetables.  Biometrics: Pre Biometrics - 05/04/18 1102    Pre Biometrics          Height  5' 2"  (1.575 m)    Weight  66.6 kg    Waist Circumference  35 inches    Hip Circumference  40 inches    Waist to Hip Ratio  0.88 %    BMI (Calculated)  26.85    Triceps Skinfold  28 mm    % Body Fat  38.7 %    Grip Strength  30 kg    Flexibility  15 in    Single Leg Stand  30 seconds            Nutrition Therapy Plan and Nutrition Goals: Nutrition Therapy & Goals - 05/04/18 0920    Nutrition Therapy          Diet  heart healthy, carb modified        Personal Nutrition Goals          Nutrition Goal  pt to identify and limit food sources of saturated fat, trans fat, sodium, refined carbohydrates    Personal Goal #2  pt to identify food quantities necessary to achieve weight loss of 6-24 lbs at graduation from cardiac rehab        [place] regarding individualized specific dietary modifications aiming towards targeted core components such as weight, hypertension, lipid management, diabetes, heart failure and other comorbidities.    Expected Outcomes  Short Term Goal: Understand basic principles of dietary content, such as calories, fat, sodium, cholesterol and nutrients.;Long Term Goal: Adherence to prescribed nutrition plan.           Nutrition Assessments: Nutrition Assessments - 05/04/18 0921    MEDFICTS Scores          Pre Score  27           Nutrition Goals  Re-Evaluation:   Nutrition Goals Re-Evaluation:   Nutrition Goals Discharge (Final Nutrition Goals Re-Evaluation):   Psychosocial: Target  Goals: Acknowledge presence or absence of significant depression and/or stress, maximize coping skills, provide positive support system. Participant is able to verbalize types and ability to use techniques and skills needed for reducing stress and depression.  Initial Review & Psychosocial Screening: Initial Psych Review & Screening - 05/04/18 1122    Initial Review          Current issues with  Current Stress Concerns;Current Anxiety/Panic    Source of Stress Concerns  Poor Coping Skills;Occupation        Family Dynamics          [place]?  Yes   Pt lists her friends and ex-husband as sources of support.        Barriers          Psychosocial barriers to participate in program  The patient should benefit from training in stress management and relaxation.        Screening Interventions          Interventions  Encouraged to exercise;To provide support and resources with identified psychosocial needs;Provide feedback about the scores to participant    Expected Outcomes  Short Term goal: Utilizing psychosocial counselor, staff and physician to assist with identification of specific Stressors or current issues interfering with healing process. Setting desired goal for each stressor or current issue identified.           Quality of Life Scores: Quality of Life - 05/04/18 1059    Quality of Life          Select  Quality of Life        Quality of Life Scores          Health/Function Pre  13.93 %    Socioeconomic Pre  11.92 %    Psych/Spiritual Pre  8.36 %    Family Pre  15.67 %    GLOBAL Pre  12.4 %          Scores of 19 and below usually indicate a poorer quality of life in these areas.  A difference of  2-3 points is a clinically meaningful difference.  A difference of 2-3 points in the total score of the Quality of  Life Index has been associated with significant improvement in overall quality of life, self-image, physical symptoms, and general health in studies assessing change in quality of life.  PHQ-9: Recent Review Flowsheet Data    Depression screen [facility] 2/9 05/30/2018 05/16/2018 05/10/2018 04/26/2018 04/14/2018   Decreased Interest 1 1 2  0 1   Down, Depressed, Hopeless 1 1 2  0 1   PHQ - 2 Score 2 2 4  0 2   Altered sleeping 3 2 1  - 0   Tired, decreased energy 2 2 1  - 0   Change in appetite 1 1 1  - 0   Feeling bad or failure about yourself  1 2 2  - 0   Trouble concentrating 0 0 1 - 0   Moving slowly or fidgety/restless 0 0 1 - 0   Suicidal thoughts 0 0 0 - 0   PHQ-9 Score 9 9 11  - 2   Difficult doing work/chores Somewhat difficult Somewhat difficult Extremely dIfficult - -     Interpretation of Total Score  Total Score Depression Severity:  1-4 = Minimal depression, 5-9 = Mild depression, 10-14 = Moderate depression, 15-19 = Moderately severe depression, 20-27 = Severe depression   Psychosocial Evaluation and Intervention: Psychosocial Evaluation - 05/10/18 1449    Psychosocial Evaluation & Interventions  Interventions  Stress management education;Relaxation education;Encouraged to exercise with the program and follow exercise prescription;Therapist referral;Physician referral    Comments  pt demonstrates significant stress and anxiety from chronic illness and occupation. pt has taken medical leave of absence from work and took a restful vacation. pt is working with her PCP who has made referral to counselor.      Expected Outcomes  pt will exhibit improved outlook and coping skills.     Continue Psychosocial Services   Follow up required by staff           Psychosocial Re-Evaluation: Psychosocial Re-Evaluation    Psychosocial Re-Evaluation    [place] Name 05/18/18 8756 06/15/18 1423   Current issues with  Current Stress Concerns;Current Anxiety/Panic  Current Stress Concerns;Current  Anxiety/Panic   Comments  pt with health related stress/anxiety. pt currently seeking care from therapist and PCP. pt participating in self care.    pt with health related stress/anxiety. pt currently seeking care from therapist and PCP. pt participating in self care.  pt work LOA ends 06/22/2018.     Expected Outcomes  pt will exhibit improved outlook and coping skills.   pt will exhibit improved outlook and coping skills.    Interventions  Therapist referral;Physician referral;Relaxation education;Stress management education;Encouraged to attend Cardiac Rehabilitation for the exercise  Therapist referral;Physician referral;Relaxation education;Stress management education;Encouraged to attend Cardiac Rehabilitation for the exercise   Continue Psychosocial Services   Follow up required by staff  Follow up required by staff       Initial Review    Row Name 05/18/18 0822 06/15/18 1423   Source of Stress Concerns  Occupation;Poor Coping Skills  Occupation;Poor Coping Skills          Psychosocial Discharge (Final Psychosocial Re-Evaluation): Psychosocial Re-Evaluation - 06/15/18 1423    Psychosocial Re-Evaluation          Current issues with  Current Stress Concerns;Current Anxiety/Panic    Comments  pt with health related stress/anxiety. pt currently seeking care from therapist and PCP. pt participating in self care.  pt work LOA ends 06/22/2018.      Expected Outcomes  pt will exhibit improved outlook and coping skills.     Interventions  Therapist referral;Physician referral;Relaxation education;Stress management education;Encouraged to attend Cardiac Rehabilitation for the exercise    Continue Psychosocial Services   Follow up required by staff        Initial Review          Source of Stress Concerns  Occupation;Poor Coping Skills           Vocational Rehabilitation: Provide vocational rehab assistance to qualifying candidates.   Vocational Rehab Evaluation &  Intervention: Vocational Rehab - 05/04/18 1135    Initial Vocational Rehab Evaluation & Intervention          Assessment shows need for Vocational Rehabilitation  Yes           Education: Education Goals: Education classes will be provided on a weekly basis, covering required topics. Participant will state understanding/return demonstration of topics presented.  Learning Barriers/Preferences: Learning Barriers/Preferences - 05/04/18 1101    Learning Barriers/Preferences          Learning Barriers  Sight    Learning Preferences  Skilled Demonstration;Verbal Instruction;Written Material           Education Topics: Count Your Pulse:  -Group instruction provided by verbal instruction, demonstration, patient participation and written materials to support subject.  Instructors address importance of being able to find your  pulse and how to count your pulse when at home without a heart monitor.  Patients get hands on experience counting their pulse with staff help and individually. Flowsheet Row CARDIAC REHAB PHASE II EXERCISE from 06/09/2018 in [place]  Date  06/09/18  Instruction Review Code  2- Demonstrated Understanding      Heart Attack, Angina, and Risk Factor Modification:  -Group instruction provided by verbal instruction, video, and written materials to support subject.  Instructors address signs and symptoms of angina and heart attacks.    Also discuss risk factors for heart disease and how to make changes to improve heart health risk factors. Flowsheet Row CARDIAC REHAB PHASE II EXERCISE from 06/09/2018 in [place]  Date  05/17/18  Instruction Review Code  2- Demonstrated Understanding      Functional Fitness:  -Group instruction provided by verbal instruction, demonstration, patient participation, and written materials to support subject.  Instructors address safety measures for doing things around the  house.  Discuss how to get up and down off the floor, how to pick things up properly, how to safely get out of a chair without assistance, and balance training.   Meditation and Mindfulness:  -Group instruction provided by verbal instruction, patient participation, and written materials to support subject.  Instructor addresses importance of mindfulness and meditation practice to help reduce stress and improve awareness.  Instructor also leads participants through a meditation exercise.    Stretching for Flexibility and Mobility:  -Group instruction provided by verbal instruction, patient participation, and written materials to support subject.  Instructors lead participants through series of stretches that are designed to increase flexibility thus improving mobility.  These stretches are additional exercise for major muscle groups that are typically performed during regular warm up and cool down.   Hands Only CPR:  -Group verbal, video, and participation provides a basic overview of AHA guidelines for community CPR. Role-play of emergencies allow participants the opportunity to practice calling for help and chest compression technique with discussion of AED use.   Hypertension: -Group verbal and written instruction that provides a basic overview of hypertension including the most recent diagnostic guidelines, risk factor reduction with self-care instructions and medication management. Flowsheet Row CARDIAC REHAB PHASE II EXERCISE from 06/09/2018 in [place]  Date  05/26/18  Instruction Review Code  2- Demonstrated Understanding       Nutrition I class: Heart Healthy Eating:  -Group instruction provided by PowerPoint slides, verbal discussion, and written materials to support subject matter. The instructor gives an explanation and review of the Therapeutic Lifestyle Changes diet recommendations, which includes a discussion on lipid goals, dietary fat,  sodium, fiber, plant stanol/sterol esters, sugar, and the components of a well-balanced, healthy diet.   Nutrition II class: Lifestyle Skills:  -Group instruction provided by PowerPoint slides, verbal discussion, and written materials to support subject matter. The instructor gives an explanation and review of label reading, grocery shopping for heart health, heart healthy recipe modifications, and ways to make healthier choices when eating out.   Diabetes Question & Answer:  -Group instruction provided by PowerPoint slides, verbal discussion, and written materials to support subject matter. The instructor gives an explanation and review of diabetes co-morbidities, pre- and post-prandial blood glucose goals, pre-exercise blood glucose goals, signs, symptoms, and treatment of hypoglycemia and hyperglycemia, and foot care basics.   Diabetes Blitz:  -Group instruction provided by [employer], verbal discussion, and written materials  to support subject matter. The instructor gives an explanation and review of the physiology behind type 1 and type 2 diabetes, diabetes medications and rational behind using different medications, pre- and post-prandial blood glucose recommendations and Hemoglobin A1c goals, diabetes diet, and exercise including blood glucose guidelines for exercising safely.    Portion Distortion:  -Group instruction provided by PowerPoint slides, verbal discussion, written materials, and food models to support subject matter. The instructor gives an explanation of serving size versus portion size, changes in portions sizes over the last 20 years, and what consists of a serving from each food group.   Stress Management:  -Group instruction provided by verbal instruction, video, and written materials to support subject matter.  Instructors review role of stress in heart disease and how to cope with stress positively.     Exercising on Your Own:  -Group instruction provided by  verbal instruction, power point, and written materials to support subject.  Instructors discuss benefits of exercise, components of exercise, frequency and intensity of exercise, and end points for exercise.  Also discuss use of nitroglycerin and activating EMS.  Review options of places to exercise outside of rehab.  Review guidelines for sex with heart disease. Flowsheet Row CARDIAC REHAB PHASE II EXERCISE from 06/09/2018 in [place]  Date  05/31/18  Instruction Review Code  2- Demonstrated Understanding      Cardiac Drugs I:  -Group instruction provided by verbal instruction and written materials to support subject.  Instructor reviews cardiac drug classes: antiplatelets, anticoagulants, beta blockers, and statins.  Instructor discusses reasons, side effects, and lifestyle considerations for each drug class.   Cardiac Drugs II:  -Group instruction provided by verbal instruction and written materials to support subject.  Instructor reviews cardiac drug classes: angiotensin converting enzyme inhibitors (ACE-I), angiotensin II receptor blockers (ARBs), nitrates, and calcium channel blockers.  Instructor discusses reasons, side effects, and lifestyle considerations for each drug class. Flowsheet Row CARDIAC REHAB PHASE II EXERCISE from 06/09/2018 in [place]  Date  05/24/18  Educator  Pharmacist  Instruction Review Code  2- Demonstrated Understanding      Anatomy and Physiology of the Circulatory System:  Group verbal and written instruction and models provide basic cardiac anatomy and physiology, with the coronary electrical and arterial systems. Review of: AMI, Angina, Valve disease, Heart Failure, Peripheral Artery Disease, Cardiac Arrhythmia, Pacemakers, and the ICD. Flowsheet Row CARDIAC REHAB PHASE II EXERCISE from 06/09/2018 in [place]  Date  05/10/18  Instruction Review Code  1-  Verbalizes Understanding      Other Education:  -Group or individual verbal, written, or video instructions that support the educational goals of the cardiac rehab program.   Holiday Eating Survival Tips:  -Group instruction provided by PowerPoint slides, verbal discussion, and written materials to support subject matter. The instructor gives patients tips, tricks, and techniques to help them not only survive but enjoy the holidays despite the onslaught of food that accompanies the holidays.   Knowledge Questionnaire Score: Knowledge Questionnaire Score - 05/04/18 1059    Knowledge Questionnaire Score          Pre Score  21/24           Core Components/Risk Factors/Patient Goals at Admission: Personal Goals and Risk Factors at Admission - 05/04/18 1059    Core Components/Risk Factors/Patient Goals on Admission           Weight Management  Yes;Weight Loss  Intervention  Weight Management: Develop a combined nutrition and exercise program designed to reach desired caloric intake, while maintaining appropriate intake of nutrient and fiber, sodium and fats, and appropriate energy expenditure required for the weight goal.;Weight Management: Provide education and appropriate resources to help participant work on and attain dietary goals.;Weight Management/Obesity: Establish reasonable short term and long term weight goals.;Obesity: Provide education and appropriate resources to help participant work on and attain dietary goals.    Admit Weight  146 lb 13.2 oz (66.6 kg)    Goal Weight: Short Term  140 lb (63.5 kg)    Goal Weight: Long Term  130 lb (59 kg)    Expected Outcomes  Short Term: Continue to assess and modify interventions until short term weight is achieved;Long Term: Adherence to nutrition and physical activity/exercise program aimed toward attainment of established weight goal;Weight Loss: Understanding of general recommendations for a balanced deficit meal plan, which promotes  1-2 lb weight loss per week and includes a negative energy balance of [phone] kcal/d;Understanding recommendations for meals to include 15-35% energy as protein, 25-35% energy from fat, 35-60% energy from carbohydrates, less than 254m of dietary cholesterol, 20-35 gm of total fiber daily;Understanding of distribution of calorie intake throughout the day with the consumption of 4-5 meals/snacks    Hypertension  Yes    Intervention  Monitor prescription use compliance.;Provide education on lifestyle modifcations including regular physical activity/exercise, weight management, moderate sodium restriction and increased consumption of fresh fruit, vegetables, and low fat dairy, alcohol moderation, and smoking cessation.    Expected Outcomes  Short Term: Continued assessment and intervention until BP is < 140/929mHG in hypertensive participants. < 130/8080mG in hypertensive participants with diabetes, heart failure or chronic kidney disease.;Long Term: Maintenance of blood pressure at goal levels.    Lipids  Yes    Intervention  Provide education and support for participant on nutrition & aerobic/resistive exercise along with prescribed medications to achieve LDL <49m69mDL >40mg29m Expected Outcomes  Short Term: Participant states understanding of desired cholesterol values and is compliant with medications prescribed. Participant is following exercise prescription and nutrition guidelines.;Long Term: Cholesterol controlled with medications as prescribed, with individualized exercise RX and with personalized nutrition plan. Value goals: LDL < 49mg,29m > 40 mg.    Stress  Yes    Intervention  Offer individual and/or small group education and counseling on adjustment to heart disease, stress management and health-related lifestyle change. Teach and support self-help strategies.;Refer participants experiencing significant psychosocial distress to appropriate mental health specialists for further evaluation and  treatment. When possible, include family members and significant others in education/counseling sessions.    Expected Outcomes  Short Term: Participant demonstrates changes in health-related behavior, relaxation and other stress management skills, ability to obtain effective social support, and compliance with psychotropic medications if prescribed.;Long Term: Emotional wellbeing is indicated by absence of clinically significant psychosocial distress or social isolation.           Core Components/Risk Factors/Patient Goals Review:  Goals and Risk Factor Review    Core Components/Risk Factors/Patient Goals Review    Row Name 05/10/18 1618 05/18/18 0824 06/15/18 1424   Personal Goals Review  Weight Management/Obesity;Hypertension;Lipids;Stress  Weight Management/Obesity;Hypertension;Lipids;Stress  Weight Management/Obesity;Hypertension;Lipids;Stress   Review  pt with multiple CAD RF demonstrates eagerness to participate in CR program.  pt personal goals are to improve dietary habits, lose weight and have an enthusiastic outlook on life.   pt with multiple CAD RF demonstrates eagerness to  participate in CR program.  pt personal goals are to improve dietary habits, lose weight and have an enthusiastic outlook on life.   pt with multiple CAD RF demonstrates eagerness to participate in CR program.  pt personal goals are to improve dietary habits, lose weight and have an enthusiastic outlook on life. 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 pt projects improved self confidence.    Expected Outcomes  pt will participate in CR exercise, nutrition and lifestyle modification to decrease overall RF and improve quality of life.   pt will participate in CR exercise, nutrition and lifestyle modification to decrease overall RF and improve quality of life.   pt will participate in CR exercise, nutrition and lifestyle modification to decrease overall RF and improve quality of life.            Core Components/Risk Factors/Patient Goals at Discharge (Final Review):  Goals and Risk Factor Review - 06/15/18 1424    Core Components/Risk Factors/Patient Goals Review          Personal Goals Review  Weight Management/Obesity;Hypertension;Lipids;Stress    Review  pt with multiple CAD RF demonstrates eagerness to participate in CR program.  pt personal goals are to improve dietary habits, lose weight and have an enthusiastic outlook on life. 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 pt projects improved self confidence.     Expected Outcomes  pt will participate in CR exercise, nutrition and lifestyle modification to decrease overall RF and improve quality of life.            ITP Comments: ITP Comments    Row Name 05/02/18 1448 05/04/18 1030 05/10/18 1430 05/18/18 0821 06/15/18 1422   ITP Comments  Medical Director-Dr. Fransico Him   Medical Director-Dr. Fransico Him   pt started group exercise. pt tolerated light activity without difficulty. pt oriented to exercise equipment and safety routine.   30 day ITP review.   30 day ITP. pt with good participation and attendance. pt demonstrates increasing eagerness to participate in CR activities.      Comments:

## 2018-06-16 ENCOUNTER — Encounter (HOSPITAL_COMMUNITY)
Admission: RE | Admit: 2018-06-16 | Discharge: 2018-06-16 | Disposition: A | Discharge status: Home / Self Care | Payer: PRIVATE HEALTH INSURANCE | Source: Ambulatory Visit | Attending: Cardiovascular Disease | Admitting: Cardiovascular Disease

## 2018-06-16 DIAGNOSIS — Z853 Personal history of malignant neoplasm of breast: Secondary | ICD-10-CM | POA: Insufficient documentation

## 2018-06-16 DIAGNOSIS — K219 Gastro-esophageal reflux disease without esophagitis: Secondary | ICD-10-CM | POA: Insufficient documentation

## 2018-06-16 DIAGNOSIS — I2510 Atherosclerotic heart disease of native coronary artery without angina pectoris: Secondary | ICD-10-CM | POA: Insufficient documentation

## 2018-06-16 DIAGNOSIS — E785 Hyperlipidemia, unspecified: Secondary | ICD-10-CM | POA: Insufficient documentation

## 2018-06-16 DIAGNOSIS — Z79899 Other long term (current) drug therapy: Secondary | ICD-10-CM | POA: Insufficient documentation

## 2018-06-16 DIAGNOSIS — I10 Essential (primary) hypertension: Secondary | ICD-10-CM | POA: Diagnosis not present

## 2018-06-16 DIAGNOSIS — Z7982 Long term (current) use of aspirin: Secondary | ICD-10-CM | POA: Diagnosis not present

## 2018-06-16 DIAGNOSIS — M810 Age-related osteoporosis without current pathological fracture: Secondary | ICD-10-CM | POA: Diagnosis not present

## 2018-06-16 DIAGNOSIS — F329 Major depressive disorder, single episode, unspecified: Secondary | ICD-10-CM | POA: Diagnosis not present

## 2018-06-16 DIAGNOSIS — Z955 Presence of coronary angioplasty implant and graft: Secondary | ICD-10-CM | POA: Insufficient documentation

## 2018-06-19 ENCOUNTER — Ambulatory Visit (HOSPITAL_COMMUNITY): Payer: PRIVATE HEALTH INSURANCE

## 2018-06-19 ENCOUNTER — Encounter (HOSPITAL_COMMUNITY)
Admission: RE | Admit: 2018-06-19 | Discharge: 2018-06-19 | Disposition: A | Discharge status: Home / Self Care | Payer: PRIVATE HEALTH INSURANCE | Source: Ambulatory Visit | Attending: Cardiovascular Disease | Admitting: Cardiovascular Disease

## 2018-06-19 DIAGNOSIS — I2510 Atherosclerotic heart disease of native coronary artery without angina pectoris: Secondary | ICD-10-CM | POA: Diagnosis not present

## 2018-06-19 DIAGNOSIS — Z955 Presence of coronary angioplasty implant and graft: Secondary | ICD-10-CM

## 2018-06-20 ENCOUNTER — Encounter: Payer: Self-pay | Admitting: Family Medicine

## 2018-06-21 ENCOUNTER — Telehealth: Payer: Self-pay | Admitting: Family Medicine

## 2018-06-21 ENCOUNTER — Ambulatory Visit (HOSPITAL_COMMUNITY): Payer: PRIVATE HEALTH INSURANCE

## 2018-06-21 ENCOUNTER — Encounter (HOSPITAL_COMMUNITY)
Admission: RE | Admit: 2018-06-21 | Discharge: 2018-06-21 | Disposition: A | Discharge status: Home / Self Care | Payer: PRIVATE HEALTH INSURANCE | Source: Ambulatory Visit | Attending: Cardiovascular Disease | Admitting: Cardiovascular Disease

## 2018-06-21 DIAGNOSIS — I2510 Atherosclerotic heart disease of native coronary artery without angina pectoris: Secondary | ICD-10-CM | POA: Diagnosis not present

## 2018-06-21 DIAGNOSIS — Z955 Presence of coronary angioplasty implant and graft: Secondary | ICD-10-CM

## 2018-06-21 NOTE — Telephone Encounter (Signed)
Pt would like a call back when the x-ray copier is fixed so she can get a copy of her x-ray she had done on 04/14/18.  Please advise

## 2018-06-23 ENCOUNTER — Encounter (HOSPITAL_COMMUNITY)
Admission: RE | Admit: 2018-06-23 | Discharge: 2018-06-23 | Disposition: A | Discharge status: Home / Self Care | Payer: PRIVATE HEALTH INSURANCE | Source: Ambulatory Visit | Attending: Cardiovascular Disease | Admitting: Cardiovascular Disease

## 2018-06-23 DIAGNOSIS — Z955 Presence of coronary angioplasty implant and graft: Secondary | ICD-10-CM

## 2018-06-23 DIAGNOSIS — I2510 Atherosclerotic heart disease of native coronary artery without angina pectoris: Secondary | ICD-10-CM | POA: Diagnosis not present

## 2018-06-26 ENCOUNTER — Ambulatory Visit (HOSPITAL_COMMUNITY): Payer: PRIVATE HEALTH INSURANCE

## 2018-06-26 ENCOUNTER — Encounter (HOSPITAL_COMMUNITY)
Admission: RE | Admit: 2018-06-26 | Discharge: 2018-06-26 | Disposition: A | Discharge status: Home / Self Care | Payer: PRIVATE HEALTH INSURANCE | Source: Ambulatory Visit | Attending: Cardiovascular Disease | Admitting: Cardiovascular Disease

## 2018-06-26 DIAGNOSIS — I2510 Atherosclerotic heart disease of native coronary artery without angina pectoris: Secondary | ICD-10-CM | POA: Diagnosis not present

## 2018-06-26 DIAGNOSIS — Z955 Presence of coronary angioplasty implant and graft: Secondary | ICD-10-CM

## 2018-06-27 ENCOUNTER — Ambulatory Visit: Payer: PRIVATE HEALTH INSURANCE | Admitting: Family Medicine

## 2018-06-27 ENCOUNTER — Other Ambulatory Visit: Payer: Self-pay | Admitting: Family Medicine

## 2018-06-27 DIAGNOSIS — G4700 Insomnia, unspecified: Secondary | ICD-10-CM

## 2018-06-27 NOTE — Telephone Encounter (Signed)
Requested Prescriptions  Pending Prescriptions Disp Refills  . traZODone (DESYREL) 50 MG tablet [Pharmacy Med Name: TRAZODONE 50 MG TABLET] 30 tablet 0    Sig: TAKE 1/2 TO 1 TABLET BY MOUTH AT BEDTIME AS NEEDED FOR SLEEP     Psychiatry: Antidepressants - Serotonin Modulator Passed - 06/27/2018  1:49 AM      Passed - Completed PHQ-2 or PHQ-9 in the last 360 days.      Passed - Valid encounter within last 6 months    Recent Outpatient Visits          4 weeks ago Adjustment disorder with mixed anxiety and depressed mood   [facility], Ranell Patrick, MD   1 month ago Anxiety state   [facility], Ranell Patrick, MD   2 months ago Anxiety state   [facility], MD   2 months ago Lower abdominal pain   [facility], Ranell Patrick, MD   2 months ago Lung nodule   [facility], Ranell Patrick, MD

## 2018-06-28 ENCOUNTER — Ambulatory Visit (HOSPITAL_COMMUNITY): Payer: PRIVATE HEALTH INSURANCE

## 2018-06-28 ENCOUNTER — Encounter (HOSPITAL_COMMUNITY)
Admission: RE | Admit: 2018-06-28 | Discharge: 2018-06-28 | Disposition: A | Discharge status: Home / Self Care | Payer: PRIVATE HEALTH INSURANCE | Source: Ambulatory Visit | Attending: Cardiovascular Disease | Admitting: Cardiovascular Disease

## 2018-06-28 DIAGNOSIS — I2510 Atherosclerotic heart disease of native coronary artery without angina pectoris: Secondary | ICD-10-CM | POA: Diagnosis not present

## 2018-06-28 DIAGNOSIS — Z955 Presence of coronary angioplasty implant and graft: Secondary | ICD-10-CM

## 2018-06-30 ENCOUNTER — Encounter (HOSPITAL_COMMUNITY)
Admission: RE | Admit: 2018-06-30 | Discharge: 2018-06-30 | Disposition: A | Discharge status: Home / Self Care | Payer: PRIVATE HEALTH INSURANCE | Source: Ambulatory Visit | Attending: Cardiovascular Disease | Admitting: Cardiovascular Disease

## 2018-06-30 ENCOUNTER — Ambulatory Visit (INDEPENDENT_AMBULATORY_CARE_PROVIDER_SITE_OTHER): Payer: PRIVATE HEALTH INSURANCE | Admitting: Neurology

## 2018-06-30 DIAGNOSIS — G4761 Periodic limb movement disorder: Secondary | ICD-10-CM

## 2018-06-30 DIAGNOSIS — F518 Other sleep disorders not due to a substance or known physiological condition: Secondary | ICD-10-CM

## 2018-06-30 DIAGNOSIS — G2581 Restless legs syndrome: Secondary | ICD-10-CM

## 2018-06-30 DIAGNOSIS — F3289 Other specified depressive episodes: Secondary | ICD-10-CM

## 2018-06-30 DIAGNOSIS — G4709 Other insomnia: Secondary | ICD-10-CM

## 2018-06-30 DIAGNOSIS — D508 Other iron deficiency anemias: Secondary | ICD-10-CM

## 2018-06-30 DIAGNOSIS — I2510 Atherosclerotic heart disease of native coronary artery without angina pectoris: Secondary | ICD-10-CM | POA: Diagnosis not present

## 2018-06-30 DIAGNOSIS — D649 Anemia, unspecified: Secondary | ICD-10-CM

## 2018-07-03 ENCOUNTER — Ambulatory Visit (INDEPENDENT_AMBULATORY_CARE_PROVIDER_SITE_OTHER): Payer: PRIVATE HEALTH INSURANCE | Admitting: Cardiovascular Disease

## 2018-07-03 ENCOUNTER — Encounter: Payer: Self-pay | Admitting: Cardiovascular Disease

## 2018-07-03 ENCOUNTER — Telehealth: Payer: Self-pay | Admitting: Family Medicine

## 2018-07-03 ENCOUNTER — Ambulatory Visit (HOSPITAL_COMMUNITY): Payer: PRIVATE HEALTH INSURANCE

## 2018-07-03 ENCOUNTER — Encounter: Payer: Self-pay | Admitting: Family Medicine

## 2018-07-03 ENCOUNTER — Encounter (HOSPITAL_COMMUNITY): Payer: PRIVATE HEALTH INSURANCE

## 2018-07-03 VITALS — BP 114/62 | HR 62 | Resp 16 | Ht 62.0 in | Wt 146.4 lb

## 2018-07-03 DIAGNOSIS — E785 Hyperlipidemia, unspecified: Secondary | ICD-10-CM | POA: Diagnosis not present

## 2018-07-03 DIAGNOSIS — Z131 Encounter for screening for diabetes mellitus: Secondary | ICD-10-CM

## 2018-07-03 DIAGNOSIS — I10 Essential (primary) hypertension: Secondary | ICD-10-CM

## 2018-07-03 DIAGNOSIS — I2510 Atherosclerotic heart disease of native coronary artery without angina pectoris: Secondary | ICD-10-CM

## 2018-07-03 DIAGNOSIS — Z79899 Other long term (current) drug therapy: Secondary | ICD-10-CM

## 2018-07-03 MED ORDER — AMLODIPINE BESYLATE 2.5 MG PO TABS: 2.5000 mg | ORAL_TABLET | Freq: Every day | ORAL | 3 refills | Status: DC

## 2018-07-03 NOTE — Telephone Encounter (Signed)
Copied from [place] [id number]. Topic: General - Other >> Jul 03, 2018  9:58 AM Alfredia Ferguson R wrote: Patient is requesting lab orders to get A1C checked and her cholesterol

## 2018-07-03 NOTE — Telephone Encounter (Signed)
Will give note to Rodi to follow up.

## 2018-07-03 NOTE — Progress Notes (Signed)
Cardiology Office Note    Date:  07/05/2018   ID:  MYRISSA CHIPLEY, DOB Dec 12, 1950, MRN [id number]  PCP:  Ellen Agreste, MD  Cardiologist:  Ellen Majestic, MD   Chief Complaint  Patient presents with  . Coronary Artery Disease   F/U Cardiology evaluation; self referred  History of Present Illness:  [name] is a 67 y.o. female who presents for follow-up cardiology evaluation following her PCI to her LAD.  I had seen her for initial evaluation in May 2019 when she was self-referred but advised to see me through her friend [name].  She presents for a four-month follow-up evaluation.  Ms [name] is a 67 year old divorced female who was told of having a possible heart murmur as a child and also had childhood asthma.  He has a history of breast CA and in 2012 underwent double mastectomy.  She subsequently underwent gel implants.  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  Her new job has created increased work-related stress.  Recently, she has noticed a light pain sensation in her jaw.  She has noticed some mild chest tightness extending to her back which seems to be more stress mediated.  It also seems to be related to some increased anxiety.  She denies any chest pain with exercise.  She admits to recent increased belching which seems to improve some of her jaw discomfort.  She was told of having a hiatal hernia.  She does not routinely exercise but she walks the hallways at work.  He has had some issues with bilateral lower extremity swelling which was felt to be multifactorial.  She saw Dr. Anice Paganini and underwent LE doppler imaging.  Is no evidence for DVT.  He did have abnormal reflux times in the common femoral vein.  Some chronic venous insufficiency in the deep venous system of the left.  There was no need for intervention but compression stockings were recommended.  Because of recurrent chest and jaw symptomatology,  she presented for an initial evaluation with me on Dec 15, 2017.  I scheduled her to undergo a CT coronary angio.  The CT scan was done on 12/28/2017 and demonstrated a calcium score of 6.  There was mild calcium noted in the left main and mid LAD.  The left main had less than 20% calcific stenosis, the LAD had 50 to 75% mid LAD stenosis with mixed plaque.  There was a normal circumflex and RCA.  FFR was positive in the LAD territory at 0.5.  Chest CT also showed nonspecific 2 mm left upper lobe nodule which was felt most likely to be benign.  Follow-up was needed if the patient is low risk but if patient was high risk noncontrast chest CT could be considered in 12 months.  When I received notification of her CT scan results, we contacted the patient, initiated aspirin 81 mg, metoprolol 12.5 mg twice a day and gave her a prescription for sublingual nitroglycerin.  She had experienced exertional chest pain particularly with uphill walking.  I recommended she undergo cardiac catheterization which I performed on March 07, 2018.  She was found to have single-vessel CAD with 95% fibrotic appearing mid LAD stenosis distal to a proximal septal diagonal vessel and underwent successful stent PCI with Cutting Balloon and DES stenting with insertion of Resolute Onyx 2.5 x 18 mm stent with no suspect reduced to 0.  Subsequently, he was readmitted to the hospital  several days later with recurrent chest pain, palpitations and hypertension.  Her chest pain was felt to be related to back and anxiety.  Over the last several months she has continued to be stable from a cardiac standpoint.  She denies any recurrent chest pain.  She had taken 26 weeks off for medical leave from work due to some of the anxiety and she is now back at work.  She denies chest tightness.  At times she notes some mild dizziness.  She has had issues with constipation and was recently started on a probiotic by GI.  She has been participating in phase 2 of the  cardiac rehab.  She presents for evaluation.  Past Medical History:  Diagnosis Date  . Allergy    "dust mite residue, mold, mildew, grapefruit, chili peppers, cat dander, some weeds" (03/07/2018)  . Anxiety   . Arthritis    "minor; right knee, left hip" (03/07/2018)  . Bruises easily   . Cancer of left breast ([place])    double mastectomy 01/26/2011  . Childhood asthma   . Contact lens/glasses fitting   . Coronary artery disease    a.  She underwent LHC 7/23 which revealed 95% stenosis of proximal LAD, managed with PCI/DES.   . Depression   . Essential hypertension   . GERD (gastroesophageal reflux disease)    "at times" (03/07/2018)  . Heart murmur   . Hiatal hernia   . High cholesterol   . History of kidney stones    "lots; no OR" (03/07/2018)  . Osteoporosis   . Rectal bleeding    hemorroid  . Ringing in ears, bilateral    "since ~ 06/2017" (03/07/2018)  . Sinus drainage     Past Surgical History:  Procedure Laterality Date  . APPENDECTOMY    . BREAST LUMPECTOMY W/ NEEDLE LOCALIZATION Left 10/05/2010   lumpectomy  . BREAST SURGERY    . CORONARY STENT INTERVENTION N/A 03/07/2018   Procedure: CORONARY STENT INTERVENTION;  Surgeon: Ellen Sine, MD;  Location: [facility];  Service: Cardiovascular;  Laterality: N/A;  . LEFT HEART CATH AND CORONARY ANGIOGRAPHY N/A 03/07/2018   Procedure: LEFT HEART CATH AND CORONARY ANGIOGRAPHY;  Surgeon: Ellen Sine, MD;  Location: [facility];  Service: Cardiovascular;  Laterality: N/A;  . MASTECTOMY Right 01/26/2011  . MASTECTOMY COMPLETE / SIMPLE W/ SENTINEL NODE BIOPSY Left 01/26/2011  . OVARIAN CYST SURGERY  1997, 2001, 2008    Current Medications: Outpatient Medications Prior to Visit  Medication Sig Dispense Refill  . albuterol (PROVENTIL HFA;VENTOLIN HFA) 108 (90 Base) MCG/ACT inhaler Inhale 1-2 puffs into the lungs every 4 (four) hours as needed for wheezing or shortness of breath. 1 Inhaler 0  . aspirin EC 81  MG tablet Take 1 tablet (81 mg total) by mouth daily. 90 tablet 3  . atorvastatin (LIPITOR) 80 MG tablet Take 1 tablet (80 mg total) by mouth daily at 6 PM. 90 tablet 3  . cetirizine (ZYRTEC) 10 MG tablet Take 10 mg by mouth daily as needed for allergies.     Marland Kitchen FLUoxetine (PROZAC) 20 MG tablet Take 2 tablets (40 mg total) by mouth daily. 180 tablet 1  . fluticasone (FLONASE) 50 MCG/ACT nasal spray Place 2 sprays into both nostrils daily. 16 g 6  . metoprolol succinate (TOPROL XL) 25 MG 24 hr tablet Take 0.5 tablets (12.5 mg total) by mouth daily. 30 tablet 6  . nitroGLYCERIN (NITROSTAT) 0.4 MG SL tablet PLACE 1 TABLET (  0.4 MG TOTAL) UNDER THE TONGUE EVERY 5 (FIVE) MINUTES AS NEEDED FOR CHEST PAIN.  3  . Probiotic Product (ALIGN) 4 MG CAPS Take 1 capsule by mouth daily.    . ticagrelor (BRILINTA) 90 MG TABS tablet Take 1 tablet (90 mg total) by mouth 2 (two) times daily. 180 tablet 3  . traZODone (DESYREL) 50 MG tablet TAKE 1/2 TO 1 TABLET BY MOUTH AT BEDTIME AS NEEDED FOR SLEEP 30 tablet 0  . amLODipine (NORVASC) 5 MG tablet Take 1 tablet (5 mg total) by mouth daily. 30 tablet 6  . clonazePAM (KLONOPIN) 0.5 MG tablet Take 1 tablet (0.5 mg total) by mouth 2 (two) times daily as needed. 30 tablet 1  . guaiFENesin (MUCINEX) 600 MG 12 hr tablet Take 600 mg by mouth 2 (two) times daily as needed.    Marland Kitchen HYDROcodone-acetaminophen (NORCO/VICODIN) 5-325 MG tablet Take 1 tablet by mouth every 6 (six) hours as needed for moderate pain. 15 tablet 0  . meclizine (ANTIVERT) 25 MG tablet Take 1 tablet (25 mg total) by mouth 3 (three) times daily as needed for dizziness. 30 tablet 0  . ondansetron (ZOFRAN ODT) 4 MG disintegrating tablet Take 1 tablet (4 mg total) by mouth every 8 (eight) hours as needed for nausea or vomiting. 10 tablet 0  . Rotigotine (NEUPRO) 1 MG/24HR PT24 Place 1 patch (1 mg total) onto the skin at bedtime as needed. May remove in the morning. 30 patch 0  . nitroGLYCERIN (NITROSTAT) 0.4 MG SL  tablet Place 1 tablet (0.4 mg total) under the tongue every 5 (five) minutes as needed for chest pain. 25 tablet 3   No facility-administered medications prior to visit.      Allergies:   Elavil [amitriptyline hcl] and Sulfur   Social History   Socioeconomic History  . Marital status: Divorced    Spouse name: Not on file  . Number of children: Not on file  . Years of education: Not on file  . Highest education level: Bachelor's degree (e.g., BA, AB, BS)  Occupational History  . Not on file  Social Needs  . Financial resource strain: Not on file  . Food insecurity:    Worry: Not on file    Inability: Not on file  . Transportation needs:    Medical: Not on file    Non-medical: Not on file  Tobacco Use  . Smoking status: Never Smoker  . Smokeless tobacco: Never Used  Substance and Sexual Activity  . Alcohol use: Yes    Comment: 03/07/2018 "probably once/month"  . Drug use: No  . Sexual activity: Not on file  Lifestyle  . Physical activity:    Days per week: Not on file    Minutes per session: Not on file  . Stress: Not on file  Relationships  . Social connections:    Talks on phone: Not on file    Gets together: Not on file    Attends religious service: Not on file    Active member of club or organization: Not on file    Attends meetings of clubs or organizations: Not on file    Relationship status: Not on file  Other Topics Concern  . Not on file  Social History Narrative   Lives at home alone   Right handed   Caffeine: 1/2 cup of coffee daily that has (1/2 & 1/2 caf)    Social history is notable that she is divorced from her second marriage.  She was married  initially at a young age and then divorced.  She was single for 20 years.  Her second marriage was of 6 years duration.  She is divorced for 4 years.  She graduated from [employer] the [employer] in music.  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  Family History:  The patient's family history includes Cancer in her brother and sister; Diabetes in her sister; Heart Problems in her mother; Heart failure in her paternal grandfather and paternal grandmother; Hyperlipidemia in her mother; Hypertension in her mother; Hypotension in her father; Other in her father.  Parents lived to an old age with her mother dying at 53 and her father dying at 63.  One brother died at age 50 with cancer and had diabetes.  One living brother and one living sister.  ROS General: Negative; No fevers, chills, or night sweats;  HEENT: Negative; No changes in vision or hearing, sinus congestion, difficulty swallowing Pulmonary: Negative; No cough, wheezing, shortness of breath, hemoptysis Cardiovascular: Negative; No chest pain, presyncope, syncope, palpitations GI: Positive for hiatal hernia GU: Negative; No dysuria, hematuria, or difficulty voiding Musculoskeletal: Negative; no myalgias, joint pain, or weakness Hematologic/Oncology:   estrogen receptor positive breast cancer Endocrine: Negative; no heat/cold intolerance; no diabetes Neuro: Negative; no changes in balance, headaches Skin: Negative; No rashes or skin lesions Psychiatric: Negative; No behavioral problems, depression Sleep: Negative; No snoring, daytime sleepiness, hypersomnolence, bruxism, restless legs, hypnogognic hallucinations, no cataplexy Other comprehensive 14 point system review is negative.   PHYSICAL EXAM:   VS:  BP 114/62   Pulse 62   Resp 16   Ht 5' 2"  (1.575 m)   Wt 146 lb 6.4 oz (66.4 kg)   LMP 09/10/2006   SpO2 98%   BMI 26.78 kg/m     Repeat blood pressure by me was 112/60 supine and 104/60 standing  Wt Readings from Last 3 Encounters:  07/03/18 146 lb 6.4 oz (66.4 kg)  06/13/18 147 lb (66.7 kg)  05/30/18 146 lb 12.8 oz (66.6 kg)    General: Alert, oriented, no distress.  Skin: normal turgor, no rashes, warm and dry HEENT:  Normocephalic, atraumatic. Pupils equal round and reactive to light; sclera anicteric; extraocular muscles intact;  Nose without nasal septal hypertrophy Mouth/Parynx benign; Mallinpatti scale 3 Neck: No JVD, no carotid bruits; normal carotid upstroke Lungs: clear to ausculatation and percussion; no wheezing or rales Chest wall: without tenderness to palpitation Heart: PMI not displaced, RRR, s1 s2 normal, 1/6 systolic murmur, no diastolic murmur, no rubs, gallops, thrills, or heaves Abdomen: soft, nontender; no hepatosplenomehaly, BS+; abdominal aorta nontender and not dilated by palpation. Back: no CVA tenderness Pulses 2+ Musculoskeletal: full range of motion, normal strength, no joint deformities Extremities: no clubbing cyanosis or edema, Homan's sign negative  Neurologic: grossly nonfocal; Cranial nerves grossly wnl Psychologic: Normal mood and affect    Studies/Labs Reviewed:   EKG:  EKG is ordered today.  ECG (independently read by me): Sinus rhythm at 60 bpm.  No ectopy.  Normal intervals  July 2019 ECG (independently read by me): Sinus bradycardia 52 bpm.  No ST segment changes  Dec 15, 2017 ECG (independently read by me): Sinus rhythm at 66 bpm.  No ectopy.  Normal intervals.  No ST segment changes.  Recent Labs: BMP Latest Ref Rng & Units 04/14/2018 03/10/2018 03/09/2018  Glucose 65 - 99 mg/dL  89 94 99  BUN 8 - 27 mg/dL 16 14 8   Creatinine 0.57 - 1.00 mg/dL 0.81 0.87 0.75  BUN/Creat Ratio 12 - 28 20 - -  Sodium 134 - 144 mmol/L 145(H) 141 140  Potassium 3.5 - 5.2 mmol/L 3.9 4.1 4.2  Chloride 96 - 106 mmol/L 105 104 107  CO2 20 - 29 mmol/L 24 26 23   Calcium 8.7 - 10.3 mg/dL 9.5 9.6 9.3     Hepatic Function Latest Ref Rng & Units 09/24/2017 12/27/2016 07/22/2016  Total Protein 6.0 - 8.5 g/dL 7.0 7.0 6.8  Albumin 3.6 - 4.8 g/dL 4.7 4.8 4.6  AST 0 - 40 IU/L 18 23 20   ALT 0 - 32 IU/L 16 15 18   Alk Phosphatase 39 - 117 IU/L 120(H) 112 115  Total Bilirubin 0.0 - 1.2 mg/dL  0.7 0.8 0.8    CBC Latest Ref Rng & Units 04/14/2018 03/10/2018 03/09/2018  WBC 4.6 - 10.2 K/uL 8.5 7.4 7.1  Hemoglobin 12.2 - 16.2 g/dL 12.5 13.3 12.9  Hematocrit 37.7 - 47.9 % 39.0 40.3 39.5  Platelets 150 - 400 K/uL - 239 210   Lab Results  Component Value Date   MCV 92.6 04/14/2018   MCV 95.7 03/10/2018   MCV 96.3 03/09/2018   Lab Results  Component Value Date   TSH 1.410 12/27/2016   Lab Results  Component Value Date   HGBA1C 5.7 (H) 09/24/2017     BNP No results found for: BNP  ProBNP No results found for: PROBNP   Lipid Panel     Component Value Date/Time   CHOL 142 03/06/2018 0913   TRIG 153 (H) 03/06/2018 0913   HDL 39 (L) 03/06/2018 0913   CHOLHDL 3.6 03/06/2018 0913   CHOLHDL 3.9 01/17/2016 0937   VLDL 37 (H) 01/17/2016 0937   LDLCALC 72 03/06/2018 0913     RADIOLOGY: No results found.   Additional studies/ records that were reviewed today include:  Reviewed records from Dr. Cindee Lame.  I also reviewed her vascular surgery evaluation by Dr. Donzetta Matters.  ------------------------------------------------------------------- Jan 02, 2018  ECHO study Conclusions  - Left ventricle: The cavity size was normal. Wall thickness was   normal. Systolic function was normal. The estimated ejection   fraction was in the range of 55% to 60%. Wall motion was normal;   there were no regional wall motion abnormalities. Features are   consistent with a pseudonormal left ventricular filling pattern,   with concomitant abnormal relaxation and increased filling   pressure (grade 2 diastolic dysfunction). - Aortic valve: Valve mobility was restricted. There was mild   regurgitation.  Impressions:  - Normal LV systolic function; moderate diastolic dysfunction;   sclerotic aortic valve with mild AI.    CT Coronary Angio  IMPRESSION: 1.  Aortic root normal 3.3 cm  2.  Calcium score 6 which is 75 th percentile for age and sex  35.  50-75% mid LAD stenosis  study will be sent for FFR CT  FFR: IMPRESSION: Hemodynamically significant mid LAD stenosis with FFR CT .50  ------------------------------------------------------------------------------------------------------- 03/07/2018: Cath/PCI Single-vessel coronary obstructive disease with a 95% fibrotic appearing mid LAD stenosis distal to a proximal septal and diagonal vessel.  Normal left circumflex and dominant RCA.  LVEDP 21 mm.  Successful PCI to the mid LAD utilizing Cutting Balloon and DES stenting with a 2.5 x 18 mm Resolute Onyx stent with the 95% stenosis being reduced to 0% evidence for brisk TIMI-3 flow with no evidence for  dissection at the completion of the procedure.  RECOMMENDATION:  Recommend uninterrupted dual antiplatelet therapy with Aspirin 24m daily and Ticagrelor 919mtwice daily for a minimum of 12 months (ACS - Class I recommendation).  The patient will continue with low-dose beta-blocker therapy.  Aggressive lipid-lowering therapy with high potency statin.    ASSESSMENT:    1. CAD in native artery: s/p PCI with DES stent to LAD 03/07/18   2. Essential hypertension   3. Hyperlipidemia with target LDL less than 70   4. Screening for diabetes mellitus   5. Medication management     PLAN:  Ms WaToneshia Coellos a very pleasant [age]ear old female aware of any prior cardiac disease but when seen by me earlier this year had experienced episodes of jaw discomfort which seemed to improve with belching and also  noticed some vague chest fullness and at times has noted this in her back.  These have occurred non-exertionally but in retrospect admits that perhaps when she had walked uphill she noticed more jaw discomfort and belching.  When I initially saw her she was noticing an increase in work-related stress which seems to play a role in symptom development. A 2D echo Doppler study which was done on Jan 02, 2018 which revealed normal systolic function with an EF of 55 to  60% and grade 2 diastolic dysfunction.  There was mild aortic sclerosis with mild aortic insufficiency.  Although her cardiac calcium score was 6, she was found to have 95% fibrotic appearing mid LAD stenosis for which she underwent successful intervention.  Her prior chest tightness has improved.  She has had issues with anxiety and required medical leave from work for approximately 6 weeks.  She has participated in cardiac rehab and is doing well.  Her blood pressure today is somewhat low on her regimen of amlodipine 5 mg in addition to Toprol-XL 12.5 mg.  I am reducing amlodipine to 2.5 mg daily.  She is now on atorvastatin 80 mg for this significant hyperlipidemia.  Previously her LDL was 74 and most recently this had improved to 72.  She admits to some easy bruisability which undoubtedly is contributed by her dual antiplatelet therapy.  She has not had follow-up laboratory for 4 months.  I have recommended repeat lipid studies, chemistry profile, CBC, and hemoglobin A 1C.  Her TSH was normal at 1.4 when last checked.  I will see her in 3 months for reevaluation and further recommendations will be made at that time.   Medication Adjustments/Labs and Tests Ordered: Current medicines are reviewed at length with the patient today.  Concerns regarding medicines are outlined above.  Medication changes, Labs and Tests ordered today are listed in the Patient Instructions below. Patient Instructions  Medication Instructions:  DECREASE amlodipine to 2.5 mg daily  If you need a refill on your cardiac medications before your next appointment, please call your pharmacy.   Lab work: Please return for FASTING labs (CMET, CBC, Lipid, HmgA1C)  Our in office lab hours are Monday-Friday 8:00-4:00, closed for lunch 12:45-1:45 pm.  No appointment needed.  If you have labs (blood work) drawn today and your tests are completely normal, you will receive your results only by: . Marland KitchenyChart Message (if you have MyChart)  OR . A paper copy in the mail If you have any lab test that is abnormal or we need to change your treatment, we will call you to review the results.  Follow-Up: [facility]  [facility].  As part of our continuing mission to provide you with exceptional heart care, we have created designated Provider Care Teams.  These Care Teams include your primary Cardiologist (physician) and Advanced Practice Providers (APPs -  Physician Assistants and Nurse Practitioners) who all work together to provide you with the care you need, when you need it. You will need a follow up appointment in 3 months.  Please call our office 2 months in advance to schedule this appointment.  You may see Ellen Majestic, MD or one of the following Advanced Practice Providers on your designated Care Team: [place], [place] . Fabian Sharp, PA-C         Signed, Ellen Majestic, MD  07/05/2018 3:17 PM    [facility] [address] [place], [place], [place]  [address] Phone: [phone]

## 2018-07-03 NOTE — Patient Instructions (Signed)
Medication Instructions:  DECREASE amlodipine to 2.5 mg daily  If you need a refill on your cardiac medications before your next appointment, please call your pharmacy.   Lab work: Please return for FASTING labs (CMET, CBC, Lipid, HmgA1C)  Our in office lab hours are Monday-Friday 8:00-4:00, closed for lunch 12:45-1:45 pm.  No appointment needed.  If you have labs (blood work) drawn today and your tests are completely normal, you will receive your results only by: Marland Kitchen MyChart Message (if you have MyChart) OR . A paper copy in the mail If you have any lab test that is abnormal or we need to change your treatment, we will call you to review the results.  Follow-Up: At [facility], you and your health needs are our priority.  As part of our continuing mission to provide you with exceptional heart care, we have created designated Provider Care Teams.  These Care Teams include your primary Cardiologist (physician) and Advanced Practice Providers (APPs -  Physician Assistants and Nurse Practitioners) who all work together to provide you with the care you need, when you need it. You will need a follow up appointment in 3 months.  Please call our office 2 months in advance to schedule this appointment.  You may see Shelva Majestic, MD or one of the following Advanced Practice Providers on your designated Care Team: [place], [place] . Fabian Sharp, PA-C

## 2018-07-05 ENCOUNTER — Telehealth: Payer: Self-pay | Admitting: Neurology

## 2018-07-05 ENCOUNTER — Encounter: Payer: Self-pay | Admitting: Cardiovascular Disease

## 2018-07-05 ENCOUNTER — Encounter (HOSPITAL_COMMUNITY)
Admission: RE | Admit: 2018-07-05 | Discharge: 2018-07-05 | Disposition: A | Discharge status: Home / Self Care | Payer: PRIVATE HEALTH INSURANCE | Source: Ambulatory Visit | Attending: Cardiovascular Disease | Admitting: Cardiovascular Disease

## 2018-07-05 ENCOUNTER — Ambulatory Visit (HOSPITAL_COMMUNITY): Payer: PRIVATE HEALTH INSURANCE

## 2018-07-05 DIAGNOSIS — I2510 Atherosclerotic heart disease of native coronary artery without angina pectoris: Secondary | ICD-10-CM | POA: Diagnosis not present

## 2018-07-05 DIAGNOSIS — Z955 Presence of coronary angioplasty implant and graft: Secondary | ICD-10-CM

## 2018-07-05 NOTE — Telephone Encounter (Signed)
Please advise pt

## 2018-07-05 NOTE — Procedures (Signed)
PATIENT'S NAME:  [name], [name] DOB:      1950-09-20      MR#:    [id number]     DATE OF RECORDING: 06/30/2018 Rudene Christians REFERRING M.D.:  Melvenia Beam, MD, PCP Janeann Forehand, MD Study Performed:   Baseline Polysomnogram HISTORY:   This 67 year old patient was seen on 06-13-2018 for a sleep consultation upon referral by Dr. Jaynee Eagles and reports her main sleep concern as having vivid, lucid dreams and daytime fatigue. She sleeps on an adjusted bed, has chronic sleep problems with insomnia. There has been a lot of medical changes over the last 4 months. She reported a lower jaw pain and tenderness over her upper back, was evaluated and diagnosed with CAD, balloon angioplasty and stent placement followed. Breast cancer survivor - had left mastectomy in 2012. The patient suffered subacute onset of peripheral vertigo, was seen by her primary care physician and sent to the ophthalmologist Dr. Katy Fitch because of diplopia. The double vision occurred with prisms, there were no headaches associated and the symptoms have meanwhile improved.   Allergy, Anxiety, Arthritis, Bruises easily, Cancer of left breast, Childhood Asthma, CAD, Depression, GERD, Essential hypertension, Heart murmur, Hiatal hernia, High cholesterol, History of kidney stones, Osteoporosis, Rectal bleeding,  The patient endorsed the Epworth Sleepiness Scale at 5/24 points, the FSS at 33/63 points.   The patient's weight 147 pounds with a height of 62 (inches), resulting in a BMI of 27.2 kg/m2. The patient's neck circumference measured 14 inches.  CURRENT MEDICATIONS: Ventolin, Norvasc, ASA 81mg , Lipitor, Zyrtec, Prozac, Flonase, Mucinex, Antivert, Toprol XL, Brilinta, Desyrel, Klonopin, Norco/Vicodin, Nitrostat, Zofran,    PROCEDURE:  This is a multichannel digital polysomnogram utilizing the Somnostar 11.2 system.  Electrodes and sensors were applied and monitored per AASM Specifications.   EEG, EOG, Chin and Limb EMG, were sampled at 200 Hz.  ECG, Snore  and Nasal Pressure, Thermal Airflow, Respiratory Effort, CPAP Flow and Pressure, Oximetry was sampled at 50 Hz. Digital video and audio were recorded.      BASELINE STUDY: Lights Out was at 21:25 and Lights On at 05:00.  Total recording time (TRT) was 455 minutes, with a total sleep time (TST) of 399 minutes.   The patient's sleep latency was 33.5 minutes.  REM latency was 240 minutes.  The sleep efficiency was 87.7 %.     SLEEP ARCHITECTURE: WASO (Wake after sleep onset) was 24.5 minutes.  There were 16 minutes in Stage N1, 263.5 minutes Stage N2, 75 minutes Stage N3 and 44.5 minutes in Stage REM.  The percentage of Stage N1 was 4.%, Stage N2 was 66.%, Stage N3 was 18.8% and Stage R (REM sleep) was 11.2%.   RESPIRATORY ANALYSIS:  There were a total of 6 respiratory events:  0 apneas and 6 hypopneas with a hypopnea index of 0.9 /hour. The patient also had 0 respiratory event related arousals (RERAs).  The total APNEA/HYPOPNEA INDEX (AHI) was 0.9 /hour and the total RESPIRATORY DISTURBANCE INDEX was 0.9 /hour.  5 events occurred in REM sleep and 2 events in NREM. The REM AHI was 6.7 /hour, versus a non-REM AHI of 0.2. The patient spent 230 minutes of total sleep time in the supine position and 169 minutes in non-supine. The supine AHI was 1.6/h versus a non-supine AHI of 0.0.  OXYGEN SATURATION & C02:  The Wake baseline 02 saturation was 98%, with the lowest being 86%. Time spent below 89% saturation equaled 30 minutes.   PERIODIC LIMB MOVEMENTS:  The  patient had a total of 122 Periodic Limb Movements.  The Periodic Limb Movement (PLM) index was 18.3 and the PLM Arousal index was 7.4/hour. The arousals were noted as: 9 were spontaneous, 49 were associated with PLMs, and 1 was associated with respiratory event.  Audio and video analysis did not show phonations or vocalizations in REM and NREM. No PLMs in REM sleep.  The patient took no bathroom breaks. Mild Snoring was noted, clustered in REM  sleep. EKG was in keeping with normal sinus rhythm (NSR). Post-study, the patient indicated that sleep was a little less comfortable in the lab.   IMPRESSION:  1. Periodic Limb Movement Disorder (PLMD) seen in NREM sleep. There were Movements in REM sleep, but not periodic. No sleep talking was recorded.  2. Mild Primary Snoring  RECOMMENDATIONS:  1. No evidence of apnea, and only PLM sleep disorder was noted. All PLMs were clustered between 22 hours and 23.30 while patient slept restless, fragmented and on her right side. It may have been pain causing this sleep fragmentation.   I certify that I have reviewed the entire raw data recording prior to the issuance of this report in accordance with the Standards of Accreditation of the American Academy of Sleep Medicine (AASM)   Larey Seat, MD   07-05-2018  Diplomat, American Board of Psychiatry and Neurology  Diplomat, American Board of [place] Director, [facility] [employer]

## 2018-07-05 NOTE — Telephone Encounter (Signed)
Called patient to discuss sleep study results. No answer at this time. LVM for the patient to call back.   

## 2018-07-05 NOTE — Telephone Encounter (Signed)
Dr Carlota Raspberry can you place future orders for pt's lab request. Dgaddy, CMA

## 2018-07-06 ENCOUNTER — Encounter: Payer: Self-pay | Admitting: Neurology

## 2018-07-06 NOTE — Progress Notes (Signed)
Cardiac Individual Treatment Plan  Patient Details  Name: [name] MRN: [id number] Date of Birth: 1950-09-23 Referring Provider:   Flowsheet Row CARDIAC REHAB PHASE II ORIENTATION from 05/04/2018 in [place]  Referring Provider  Troy Sine MD       Initial Encounter Date:  [place] PHASE II ORIENTATION from 05/04/2018 in [place]  Date  05/04/18      Visit Diagnosis: 07/23/2019Stented coronary artery  Patient's Home Medications on Admission:  Current Outpatient Medications:  .  albuterol (PROVENTIL HFA;VENTOLIN HFA) 108 (90 Base) MCG/ACT inhaler, Inhale 1-2 puffs into the lungs every 4 (four) hours as needed for wheezing or shortness of breath., Disp: 1 Inhaler, Rfl: 0 .  amLODipine (NORVASC) 2.5 MG tablet, Take 1 tablet (2.5 mg total) by mouth daily., Disp: 90 tablet, Rfl: 3 .  aspirin EC 81 MG tablet, Take 1 tablet (81 mg total) by mouth daily., Disp: 90 tablet, Rfl: 3 .  atorvastatin (LIPITOR) 80 MG tablet, Take 1 tablet (80 mg total) by mouth daily at 6 PM., Disp: 90 tablet, Rfl: 3 .  cetirizine (ZYRTEC) 10 MG tablet, Take 10 mg by mouth daily as needed for allergies. , Disp: , Rfl:  .  FLUoxetine (PROZAC) 20 MG tablet, Take 2 tablets (40 mg total) by mouth daily., Disp: 180 tablet, Rfl: 1 .  fluticasone (FLONASE) 50 MCG/ACT nasal spray, Place 2 sprays into both nostrils daily., Disp: 16 g, Rfl: 6 .  metoprolol succinate (TOPROL XL) 25 MG 24 hr tablet, Take 0.5 tablets (12.5 mg total) by mouth daily., Disp: 30 tablet, Rfl: 6 .  nitroGLYCERIN (NITROSTAT) 0.4 MG SL tablet, PLACE 1 TABLET (0.4 MG TOTAL) UNDER THE TONGUE EVERY 5 (FIVE) MINUTES AS NEEDED FOR CHEST PAIN., Disp: , Rfl: 3 .  Probiotic Product (ALIGN) 4 MG CAPS, Take 1 capsule by mouth daily., Disp: , Rfl:  .  ticagrelor (BRILINTA) 90 MG TABS tablet, Take 1 tablet (90 mg total) by mouth 2 (two) times daily., Disp: 180 tablet,  Rfl: 3 .  traZODone (DESYREL) 50 MG tablet, TAKE 1/2 TO 1 TABLET BY MOUTH AT BEDTIME AS NEEDED FOR SLEEP, Disp: 30 tablet, Rfl: 0  Past Medical History: Past Medical History:  Diagnosis Date  . Allergy    "dust mite residue, mold, mildew, grapefruit, chili peppers, cat dander, some weeds" (03/07/2018)  . Anxiety   . Arthritis    "minor; right knee, left hip" (03/07/2018)  . Bruises easily   . Cancer of left breast ([place])    double mastectomy 01/26/2011  . Childhood asthma   . Contact lens/glasses fitting   . Coronary artery disease    a.  She underwent LHC 7/23 which revealed 95% stenosis of proximal LAD, managed with PCI/DES.   . Depression   . Essential hypertension   . GERD (gastroesophageal reflux disease)    "at times" (03/07/2018)  . Heart murmur   . Hiatal hernia   . High cholesterol   . History of kidney stones    "lots; no OR" (03/07/2018)  . Osteoporosis   . Rectal bleeding    hemorroid  . Ringing in ears, bilateral    "since ~ 06/2017" (03/07/2018)  . Sinus drainage     Tobacco Use: Social History   Tobacco Use  Smoking Status Never Smoker  Smokeless Tobacco Never Used    Labs: Recent Review Scientist, physiological    Labs for ITP Cardiac and Pulmonary  Rehab Latest Ref Rng & Units 05/21/2015 01/17/2016 07/22/2016 09/24/2017 03/06/2018   Cholestrol 100 - 199 mg/dL 247(H) 163 201(H) 249(H) 142   LDLCALC 0 - 99 mg/dL 147(H) 84 126(H) 174(H) 72   HDL >39 mg/dL 34(L) 42(L) 46 45 39(L)   Trlycerides 0 - 149 mg/dL 329(H) 184(H) 143 148 153(H)   Hemoglobin A1c 4.8 - 5.6 % 5.9(H) - 5.6 5.7(H) -   TCO2 0 - 100 mmol/L - - - - -      Capillary Blood Glucose: No results found for: GLUCAP   Exercise Target Goals: Exercise Program Goal: Individual exercise prescription set using results from initial 6 min walk test and THRR while considering  patient's activity barriers and safety.   Exercise Prescription Goal: Initial exercise prescription builds to 30-45 minutes a day of  aerobic activity, 2-3 days per week.  Home exercise guidelines will be given to patient during program as part of exercise prescription that the participant will acknowledge.  Activity Barriers & Risk Stratification: Activity Barriers & Cardiac Risk Stratification - 05/04/18 1103    Activity Barriers & Cardiac Risk Stratification          Activity Barriers  Deconditioning    Cardiac Risk Stratification  Moderate           6 Minute Walk: 6 Minute Walk    6 Minute Walk    Row Name 05/04/18 1101   Phase  Initial   Distance  1600 feet   Walk Time  6 minutes   # of Rest Breaks  0   MPH  3.03   METS  3.39   RPE  12   Perceived Dyspnea   0   VO2 Peak  11.87   Symptoms  No   Resting HR  67 bpm   Resting BP  108/60   Resting Oxygen Saturation   98 %   Exercise Oxygen Saturation  during 6 min walk  98 %   Max Ex. HR  90 bpm   Max Ex. BP  118/60   2 Minute Post BP  98/60          Oxygen Initial Assessment:   Oxygen Re-Evaluation:   Oxygen Discharge (Final Oxygen Re-Evaluation):   Initial Exercise Prescription: Initial Exercise Prescription - 05/04/18 1100    Date of Initial Exercise RX and Referring Provider          Date  05/04/18    Referring Provider  Troy Sine MD     Expected Discharge Date  08/02/18        Recumbant Bike          Level  1.5    Watts  25    Minutes  10    METs  3.21        NuStep          Level  2    SPM  75    Minutes  10    METs  3        Track          Laps  12    Minutes  10    METs  3.07        Prescription Details          Frequency (times per week)  3x    Duration  Progress to 30 minutes of continuous aerobic without signs/symptoms of physical distress        Intensity  THRR 40-80% of Max Heartrate  62-123    Ratings of Perceived Exertion  11-13    Perceived Dyspnea  0-4        Progression          Progression  Continue progressive overload as per policy without signs/symptoms or physical  distress.        Resistance Training          Training Prescription  Yes    Weight  3lbs    Reps  10-15           Perform Capillary Blood Glucose checks as needed.  Exercise Prescription Changes: Exercise Prescription Changes    Response to Exercise    Row Name 05/10/18 1326 05/15/18 1324 05/29/18 1134 06/14/18 1548 07/04/18 1526   Blood Pressure (Admit)  108/60  114/58  110/62  104/56  120/74   Blood Pressure (Exercise)  120/60  138/60  110/60  118/68  128/58   Blood Pressure (Exit)  100/58  110/52  100/62  98/60  100/60   Heart Rate (Admit)  75 bpm  67 bpm  94 bpm  72 bpm  69 bpm   Heart Rate (Exercise)  119 bpm  97 bpm  102 bpm  107 bpm  112 bpm   Heart Rate (Exit)  72 bpm  67 bpm  64 bpm  65 bpm  75 bpm   Rating of Perceived Exertion (Exercise)  12  11  12  12  12    Symptoms  none  none  none  none  none   Duration  Progress to 30 minutes of  aerobic without signs/symptoms of physical distress  Progress to 30 minutes of  aerobic without signs/symptoms of physical distress  Progress to 30 minutes of  aerobic without signs/symptoms of physical distress  Progress to 30 minutes of  aerobic without signs/symptoms of physical distress  Progress to 30 minutes of  aerobic without signs/symptoms of physical distress   Intensity  THRR unchanged  THRR unchanged  THRR unchanged  THRR unchanged  THRR unchanged       Progression    Row Name 05/10/18 1326 05/15/18 1324 05/29/18 1134 06/14/18 1548 07/04/18 1526   Progression  Continue to progress workloads to maintain intensity without signs/symptoms of physical distress.  Continue to progress workloads to maintain intensity without signs/symptoms of physical distress.  Continue to progress workloads to maintain intensity without signs/symptoms of physical distress.  Continue to progress workloads to maintain intensity without signs/symptoms of physical distress.  Continue to progress workloads to maintain intensity without signs/symptoms of  physical distress.   Average METs  2.7  3  3.96  3.48  3.7       Resistance Training    Row Name 05/10/18 1326 05/15/18 1324 05/29/18 1134 06/14/18 1548 07/04/18 1526   Training Prescription  No Relaxation day, no weights  Yes  Yes  No  Yes   Weight  no documentation  3lbs  3lbs  no documentation  3 lbs.    Reps  no documentation  10-15  10-15  no documentation  10-15   Time  no documentation  10 Minutes  10 Minutes  no documentation  10 Minutes       Interval Training    Row Name 05/10/18 1326 05/15/18 1324 05/29/18 1134 06/14/18 1548 07/04/18 1526   Interval Training  No  No  No  No  No       Recumbant Bike    Row Name  05/10/18 1326 05/15/18 1324 05/29/18 1134 06/14/18 1548 07/04/18 1526   Level  1.5  1.5  1.5  1.5  1.5   Watts  19  23  32  32  30   Minutes  10  10  10  10  10    METs  2.94  3.1  5.15  3.45  3.45       NuStep    Row Name 05/10/18 1326 05/15/18 1324 05/29/18 1134 06/14/18 1548 07/04/18 1526   Level  2  3  4  4  4    SPM  75  75  85  85  85   Minutes  10  10  10  10  10    METs  2  2.2  3.3  3.4  4.2       Track    Row Name 05/10/18 1326 05/15/18 1324 05/29/18 1134 06/14/18 1548 07/04/18 1526   Laps  13  16  14  15  16    Minutes  10  10  10  10  10    METs  3.26  3.79  3.44  3.6  3.44          Exercise Comments: Exercise Comments    Row Name 05/15/18 1358 05/19/18 1350 05/31/18 1621 06/14/18 1552 07/05/18 1501   Exercise Comments  Reviewed METs and goals with patient.  Reviewed home exercise guidelines with patient.  Reviewed METs and goals with Pt.   Pt continues to progress with exercise and increase MET level with each session.   Reviewed METs and goals with Pt. Will continue exercising at home.       Exercise Goals and Review: Exercise Goals    Exercise Goals    Row Name 05/04/18 1103   Increase Physical Activity  Yes   Intervention  Provide advice, education, support and counseling about physical activity/exercise needs.;Develop an  individualized exercise prescription for aerobic and resistive training based on initial evaluation findings, risk stratification, comorbidities and participant's personal goals.   Expected Outcomes  Short Term: Attend rehab on a regular basis to increase amount of physical activity.;Long Term: Exercising regularly at least 3-5 days a week.;Long Term: Add in home exercise to make exercise part of routine and to increase amount of physical activity.   Increase Strength and Stamina  Yes   Intervention  Provide advice, education, support and counseling about physical activity/exercise needs.;Develop an individualized exercise prescription for aerobic and resistive training based on initial evaluation findings, risk stratification, comorbidities and participant's personal goals.   Expected Outcomes  Short Term: Increase workloads from initial exercise prescription for resistance, speed, and METs.;Short Term: Perform resistance training exercises routinely during rehab and add in resistance training at home;Long Term: Improve cardiorespiratory fitness, muscular endurance and strength as measured by increased METs and functional capacity (6MWT)   Able to understand and use rate of perceived exertion (RPE) scale  Yes   Intervention  Provide education and explanation on how to use RPE scale   Expected Outcomes  Short Term: Able to use RPE daily in rehab to express subjective intensity level;Long Term:  Able to use RPE to guide intensity level when exercising independently   Knowledge and understanding of Target Heart Rate Range (THRR)  Yes   Intervention  Provide education and explanation of THRR including how the numbers were predicted and where they are located for reference   Expected Outcomes  Short Term: Able to state/look up THRR;Long Term: Able to use THRR to govern  intensity when exercising independently;Short Term: Able to use daily as guideline for intensity in rehab   Able to check pulse independently   Yes   Intervention  Provide education and demonstration on how to check pulse in carotid and radial arteries.;Review the importance of being able to check your own pulse for safety during independent exercise   Expected Outcomes  Short Term: Able to explain why pulse checking is important during independent exercise;Long Term: Able to check pulse independently and accurately   Understanding of Exercise Prescription  Yes   Intervention  Provide education, explanation, and written materials on patient's individual exercise prescription   Expected Outcomes  Short Term: Able to explain program exercise prescription;Long Term: Able to explain home exercise prescription to exercise independently          Exercise Goals Re-Evaluation : Exercise Goals Re-Evaluation    Exercise Goal Re-Evaluation    Row Name 05/15/18 1358 05/19/18 1350 05/31/18 1620 06/14/18 1550 07/05/18 1459   Exercise Goals Review  Able to understand and use rate of perceived exertion (RPE) scale;Increase Physical Activity  Able to understand and use rate of perceived exertion (RPE) scale;Increase Physical Activity;Understanding of Exercise Prescription;Knowledge and understanding of Target Heart Rate Range (THRR)  Increase Physical Activity;Increase Strength and Stamina;Able to check pulse independently;Knowledge and understanding of Target Heart Rate Range (THRR);Able to understand and use rate of perceived exertion (RPE) scale;Understanding of Exercise Prescription  Increase Physical Activity;Increase Strength and Stamina;Able to check pulse independently;Knowledge and understanding of Target Heart Rate Range (THRR);Able to understand and use rate of perceived exertion (RPE) scale;Understanding of Exercise Prescription  Increase Physical Activity;Increase Strength and Stamina;Able to check pulse independently;Understanding of Exercise Prescription;Knowledge and understanding of Target Heart Rate Range (THRR);Able to understand and use  rate of perceived exertion (RPE) scale   Comments  Patient is walking 1 day in addition to exercise at cardiac rehab. Pt understands and is able to use RPE scale appropriately.  Reviewed home exercise guidelines with patient including THRR, RPE scale, endpoints for exercise. Pt is walking as her mode of home exercise.  Reviewed METs and goals with Pt. Will continue to walk 30 minutes per day in addition to CR exercise.   Pt is tolerating exercise well. Continues to increase MET level. Continues to exercise at home in addition to Cardiac Rehab.   Reviewed METs and goals with Pt. Pt MET level is 3.7 and continues to increase. Pt continues to wlak for 30 minutes 2 days per week in addition to cardiac rehab.    Expected Outcomes  Increase workloads as tolerated to help increase energy and stamina.  Pt will increase walking to 2-3 days/week at home in addition to exercise at cardiac rehab.  Will continue to monitor and progress Pt as tolerated.   Will continue to monitor and progress Pt as tolerated.   Will continue to monitor and progress Pt as tolerated.           Discharge Exercise Prescription (Final Exercise Prescription Changes): Exercise Prescription Changes - 07/04/18 1526    Response to Exercise          Blood Pressure (Admit)  120/74    Blood Pressure (Exercise)  128/58    Blood Pressure (Exit)  100/60    Heart Rate (Admit)  69 bpm    Heart Rate (Exercise)  112 bpm    Heart Rate (Exit)  75 bpm    Rating of Perceived Exertion (Exercise)  12    Symptoms  none  Duration  Progress to 30 minutes of  aerobic without signs/symptoms of physical distress    Intensity  THRR unchanged        Progression          Progression  Continue to progress workloads to maintain intensity without signs/symptoms of physical distress.    Average METs  3.7        Resistance Training          Training Prescription  Yes    Weight  3 lbs.     Reps  10-15    Time  10 Minutes        Interval Training           Interval Training  No        Recumbant Bike          Level  1.5    Watts  30    Minutes  10    METs  3.45        NuStep          Level  4    SPM  85    Minutes  10    METs  4.2        Track          Laps  16    Minutes  10    METs  3.44           Nutrition:  Target Goals: Understanding of nutrition guidelines, daily intake of sodium <1564m, cholesterol <2068m calories 30% from fat and 7% or less from saturated fats, daily to have 5 or more servings of fruits and vegetables.  Biometrics: Pre Biometrics - 05/04/18 1102    Pre Biometrics          Height  5' 2"  (1.575 m)    Weight  66.6 kg    Waist Circumference  35 inches    Hip Circumference  40 inches    Waist to Hip Ratio  0.88 %    BMI (Calculated)  26.85    Triceps Skinfold  28 mm    % Body Fat  38.7 %    Grip Strength  30 kg    Flexibility  15 in    Single Leg Stand  30 seconds            Nutrition Therapy Plan and Nutrition Goals: Nutrition Therapy & Goals - 05/04/18 0920    Nutrition Therapy          Diet  heart healthy, carb modified        Personal Nutrition Goals          Nutrition Goal  pt to identify and limit food sources of saturated fat, trans fat, sodium, refined carbohydrates    Personal Goal #2  pt to identify food quantities necessary to achieve weight loss of 6-24 lbs at graduation from cardiac rehab        [place] and counsel regarding individualized specific dietary modifications aiming towards targeted core components such as weight, hypertension, lipid management, diabetes, heart failure and other comorbidities.    Expected Outcomes  Short Term Goal: Understand basic principles of dietary content, such as calories, fat, sodium, cholesterol and nutrients.;Long Term Goal: Adherence to prescribed nutrition plan.           Nutrition Assessments: Nutrition Assessments - 05/04/18 0921    MEDFICTS Scores  Pre Score  27           Nutrition Goals Re-Evaluation:   Nutrition Goals Re-Evaluation:   Nutrition Goals Discharge (Final Nutrition Goals Re-Evaluation):   Psychosocial: Target Goals: Acknowledge presence or absence of significant depression and/or stress, maximize coping skills, provide positive support system. Participant is able to verbalize types and ability to use techniques and skills needed for reducing stress and depression.  Initial Review & Psychosocial Screening: Initial Psych Review & Screening - 05/04/18 1122    Initial Review          Current issues with  Current Stress Concerns;Current Anxiety/Panic    Source of Stress Concerns  Poor Coping Skills;Occupation        Family Dynamics          [place]?  Yes   Pt lists her friends and ex-husband as sources of support.        Barriers          Psychosocial barriers to participate in program  The patient should benefit from training in stress management and relaxation.        Screening Interventions          Interventions  Encouraged to exercise;To provide support and resources with identified psychosocial needs;Provide feedback about the scores to participant    Expected Outcomes  Short Term goal: Utilizing psychosocial counselor, staff and physician to assist with identification of specific Stressors or current issues interfering with healing process. Setting desired goal for each stressor or current issue identified.           Quality of Life Scores: Quality of Life - 05/04/18 1059    Quality of Life          Select  Quality of Life        Quality of Life Scores          Health/Function Pre  13.93 %    Socioeconomic Pre  11.92 %    Psych/Spiritual Pre  8.36 %    Family Pre  15.67 %    GLOBAL Pre  12.4 %          Scores of 19 and below usually indicate a poorer quality of life in these areas.  A difference of  2-3 points is a clinically meaningful difference.  A difference of  2-3 points in the total score of the Quality of Life Index has been associated with significant improvement in overall quality of life, self-image, physical symptoms, and general health in studies assessing change in quality of life.  PHQ-9: Recent Review Flowsheet Data    Depression screen [facility] 2/9 05/30/2018 05/16/2018 05/10/2018 04/26/2018 04/14/2018   Decreased Interest 1 1 2  0 1   Down, Depressed, Hopeless 1 1 2  0 1   PHQ - 2 Score 2 2 4  0 2   Altered sleeping 3 2 1  - 0   Tired, decreased energy 2 2 1  - 0   Change in appetite 1 1 1  - 0   Feeling bad or failure about yourself  1 2 2  - 0   Trouble concentrating 0 0 1 - 0   Moving slowly or fidgety/restless 0 0 1 - 0   Suicidal thoughts 0 0 0 - 0   PHQ-9 Score 9 9 11  - 2   Difficult doing work/chores Somewhat difficult Somewhat difficult Extremely dIfficult - -     Interpretation of Total Score  Total Score Depression Severity:  1-4 = Minimal depression, 5-9 =  Mild depression, 10-14 = Moderate depression, 15-19 = Moderately severe depression, 20-27 = Severe depression   Psychosocial Evaluation and Intervention: Psychosocial Evaluation - 05/10/18 1449    Psychosocial Evaluation & Interventions          Interventions  Stress management education;Relaxation education;Encouraged to exercise with the program and follow exercise prescription;Therapist referral;Physician referral    Comments  pt demonstrates significant stress and anxiety from chronic illness and occupation. pt has taken medical leave of absence from work and took a restful vacation. pt is working with her PCP who has made referral to counselor.      Expected Outcomes  pt will exhibit improved outlook and coping skills.     Continue Psychosocial Services   Follow up required by staff           Psychosocial Re-Evaluation: Psychosocial Re-Evaluation    Psychosocial Re-Evaluation    [place] Name 05/18/18 2751 06/15/18 1423 07/06/18 1227   Current issues with  Current Stress  Concerns;Current Anxiety/Panic  Current Stress Concerns;Current Anxiety/Panic  Current Stress Concerns;Current Anxiety/Panic   Comments  pt with health related stress/anxiety. pt currently seeking care from therapist and PCP. pt participating in self care.    pt with health related stress/anxiety. pt currently seeking care from therapist and PCP. pt participating in self care.  pt work LOA ends 06/22/2018.    pt with health related stress/anxiety. pt currently seeking care from therapist and PCP. pt participating in self care.  pt has returned to work. pt is demonstrating effective coping skills especially with added stress of job duty changes while out on leave.    Expected Outcomes  pt will exhibit improved outlook and coping skills.   pt will exhibit improved outlook and coping skills.   pt will exhibit improved outlook and coping skills.    Interventions  Therapist referral;Physician referral;Relaxation education;Stress management education;Encouraged to attend Cardiac Rehabilitation for the exercise  Therapist referral;Physician referral;Relaxation education;Stress management education;Encouraged to attend Cardiac Rehabilitation for the exercise  Therapist referral;Physician referral;Relaxation education;Stress management education;Encouraged to attend Cardiac Rehabilitation for the exercise   Continue Psychosocial Services   Follow up required by staff  Follow up required by staff  Follow up required by staff       Initial Review    Row Name 05/18/18 0822 06/15/18 1423 07/06/18 1227   Source of Stress Concerns  Occupation;Poor Coping Skills  Occupation;Poor Coping Skills  no documentation          Psychosocial Discharge (Final Psychosocial Re-Evaluation): Psychosocial Re-Evaluation - 07/06/18 1227    Psychosocial Re-Evaluation          Current issues with  Current Stress Concerns;Current Anxiety/Panic    Comments  pt with health related stress/anxiety. pt currently seeking care from  therapist and PCP. pt participating in self care.  pt has returned to work. pt is demonstrating effective coping skills especially with added stress of job duty changes while out on leave.     Expected Outcomes  pt will exhibit improved outlook and coping skills.     Interventions  Therapist referral;Physician referral;Relaxation education;Stress management education;Encouraged to attend Cardiac Rehabilitation for the exercise    Continue Psychosocial Services   Follow up required by staff           Vocational Rehabilitation: Provide vocational rehab assistance to qualifying candidates.   Vocational Rehab Evaluation & Intervention: Vocational Rehab - 05/04/18 1135    Initial Vocational Rehab Evaluation & Intervention  Assessment shows need for Vocational Rehabilitation  Yes           Education: Education Goals: Education classes will be provided on a weekly basis, covering required topics. Participant will state understanding/return demonstration of topics presented.  Learning Barriers/Preferences: Learning Barriers/Preferences - 05/04/18 1101    Learning Barriers/Preferences          Learning Barriers  Sight    Learning Preferences  Skilled Demonstration;Verbal Instruction;Written Material           Education Topics: Count Your Pulse:  -Group instruction provided by verbal instruction, demonstration, patient participation and written materials to support subject.  Instructors address importance of being able to find your pulse and how to count your pulse when at home without a heart monitor.  Patients get hands on experience counting their pulse with staff help and individually. Flowsheet Row CARDIAC REHAB PHASE II EXERCISE from 07/05/2018 in [place]  Date  06/09/18  Instruction Review Code  2- Demonstrated Understanding      Heart Attack, Angina, and Risk Factor Modification:  -Group instruction provided by verbal instruction,  video, and written materials to support subject.  Instructors address signs and symptoms of angina and heart attacks.    Also discuss risk factors for heart disease and how to make changes to improve heart health risk factors. Flowsheet Row CARDIAC REHAB PHASE II EXERCISE from 07/05/2018 in [place]  Date  05/17/18  Instruction Review Code  2- Demonstrated Understanding      Functional Fitness:  -Group instruction provided by verbal instruction, demonstration, patient participation, and written materials to support subject.  Instructors address safety measures for doing things around the house.  Discuss how to get up and down off the floor, how to pick things up properly, how to safely get out of a chair without assistance, and balance training. Flowsheet Row CARDIAC REHAB PHASE II EXERCISE from 07/05/2018 in [place]  Date  06/30/18  Educator  EP  Instruction Review Code  2- Demonstrated Understanding      Meditation and Mindfulness:  -Group instruction provided by verbal instruction, patient participation, and written materials to support subject.  Instructor addresses importance of mindfulness and meditation practice to help reduce stress and improve awareness.  Instructor also leads participants through a meditation exercise.    Stretching for Flexibility and Mobility:  -Group instruction provided by verbal instruction, patient participation, and written materials to support subject.  Instructors lead participants through series of stretches that are designed to increase flexibility thus improving mobility.  These stretches are additional exercise for major muscle groups that are typically performed during regular warm up and cool down.   Hands Only CPR:  -Group verbal, video, and participation provides a basic overview of AHA guidelines for community CPR. Role-play of emergencies allow participants the opportunity to  practice calling for help and chest compression technique with discussion of AED use.   Hypertension: -Group verbal and written instruction that provides a basic overview of hypertension including the most recent diagnostic guidelines, risk factor reduction with self-care instructions and medication management. Flowsheet Row CARDIAC REHAB PHASE II EXERCISE from 07/05/2018 in [place]  Date  07/05/18  Instruction Review Code  2- Demonstrated Understanding       Nutrition I class: Heart Healthy Eating:  -Group instruction provided by PowerPoint slides, verbal discussion, and written materials to support subject matter. The instructor gives an explanation  and review of the Therapeutic Lifestyle Changes diet recommendations, which includes a discussion on lipid goals, dietary fat, sodium, fiber, plant stanol/sterol esters, sugar, and the components of a well-balanced, healthy diet.   Nutrition II class: Lifestyle Skills:  -Group instruction provided by PowerPoint slides, verbal discussion, and written materials to support subject matter. The instructor gives an explanation and review of label reading, grocery shopping for heart health, heart healthy recipe modifications, and ways to make healthier choices when eating out.   Diabetes Question & Answer:  -Group instruction provided by PowerPoint slides, verbal discussion, and written materials to support subject matter. The instructor gives an explanation and review of diabetes co-morbidities, pre- and post-prandial blood glucose goals, pre-exercise blood glucose goals, signs, symptoms, and treatment of hypoglycemia and hyperglycemia, and foot care basics.   Diabetes Blitz:  -Group instruction provided by PowerPoint slides, verbal discussion, and written materials to support subject matter. The instructor gives an explanation and review of the physiology behind type 1 and type 2 diabetes, diabetes medications and  rational behind using different medications, pre- and post-prandial blood glucose recommendations and Hemoglobin A1c goals, diabetes diet, and exercise including blood glucose guidelines for exercising safely.    Portion Distortion:  -Group instruction provided by PowerPoint slides, verbal discussion, written materials, and food models to support subject matter. The instructor gives an explanation of serving size versus portion size, changes in portions sizes over the last 20 years, and what consists of a serving from each food group.   Stress Management:  -Group instruction provided by verbal instruction, video, and written materials to support subject matter.  Instructors review role of stress in heart disease and how to cope with stress positively.   Flowsheet Row CARDIAC REHAB PHASE II EXERCISE from 07/05/2018 in [place]  Date  06/21/18  Instruction Review Code  2- Demonstrated Understanding      Exercising on Your Own:  -Group instruction provided by verbal instruction, power point, and written materials to support subject.  Instructors discuss benefits of exercise, components of exercise, frequency and intensity of exercise, and end points for exercise.  Also discuss use of nitroglycerin and activating EMS.  Review options of places to exercise outside of rehab.  Review guidelines for sex with heart disease. Flowsheet Row CARDIAC REHAB PHASE II EXERCISE from 07/05/2018 in [place]  Date  05/31/18  Instruction Review Code  2- Demonstrated Understanding      Cardiac Drugs I:  -Group instruction provided by verbal instruction and written materials to support subject.  Instructor reviews cardiac drug classes: antiplatelets, anticoagulants, beta blockers, and statins.  Instructor discusses reasons, side effects, and lifestyle considerations for each drug class. Flowsheet Row CARDIAC REHAB PHASE II EXERCISE from 07/05/2018  in [place]  Date  06/28/18  Educator  Pharmacist  Instruction Review Code  2- Demonstrated Understanding      Cardiac Drugs II:  -Group instruction provided by verbal instruction and written materials to support subject.  Instructor reviews cardiac drug classes: angiotensin converting enzyme inhibitors (ACE-I), angiotensin II receptor blockers (ARBs), nitrates, and calcium channel blockers.  Instructor discusses reasons, side effects, and lifestyle considerations for each drug class. Flowsheet Row CARDIAC REHAB PHASE II EXERCISE from 07/05/2018 in [place]  Date  05/24/18  Educator  Pharmacist  Instruction Review Code  2- Demonstrated Understanding      Anatomy and Physiology of the Circulatory System:  Group  verbal and written instruction and models provide basic cardiac anatomy and physiology, with the coronary electrical and arterial systems. Review of: AMI, Angina, Valve disease, Heart Failure, Peripheral Artery Disease, Cardiac Arrhythmia, Pacemakers, and the ICD. Flowsheet Row CARDIAC REHAB PHASE II EXERCISE from 07/05/2018 in [place]  Date  05/10/18  Instruction Review Code  1- Verbalizes Understanding      Other Education:  -Group or individual verbal, written, or video instructions that support the educational goals of the cardiac rehab program.   Holiday Eating Survival Tips:  -Group instruction provided by PowerPoint slides, verbal discussion, and written materials to support subject matter. The instructor gives patients tips, tricks, and techniques to help them not only survive but enjoy the holidays despite the onslaught of food that accompanies the holidays.   Knowledge Questionnaire Score: Knowledge Questionnaire Score - 05/04/18 1059    Knowledge Questionnaire Score          Pre Score  21/24           Core Components/Risk Factors/Patient Goals at  Admission: Personal Goals and Risk Factors at Admission - 05/04/18 1059    Core Components/Risk Factors/Patient Goals on Admission           Weight Management  Yes;Weight Loss    Intervention  Weight Management: Develop a combined nutrition and exercise program designed to reach desired caloric intake, while maintaining appropriate intake of nutrient and fiber, sodium and fats, and appropriate energy expenditure required for the weight goal.;Weight Management: Provide education and appropriate resources to help participant work on and attain dietary goals.;Weight Management/Obesity: Establish reasonable short term and long term weight goals.;Obesity: Provide education and appropriate resources to help participant work on and attain dietary goals.    Admit Weight  146 lb 13.2 oz (66.6 kg)    Goal Weight: Short Term  140 lb (63.5 kg)    Goal Weight: Long Term  130 lb (59 kg)    Expected Outcomes  Short Term: Continue to assess and modify interventions until short term weight is achieved;Long Term: Adherence to nutrition and physical activity/exercise program aimed toward attainment of established weight goal;Weight Loss: Understanding of general recommendations for a balanced deficit meal plan, which promotes 1-2 lb weight loss per week and includes a negative energy balance of [phone] kcal/d;Understanding recommendations for meals to include 15-35% energy as protein, 25-35% energy from fat, 35-60% energy from carbohydrates, less than 232m of dietary cholesterol, 20-35 gm of total fiber daily;Understanding of distribution of calorie intake throughout the day with the consumption of 4-5 meals/snacks    Hypertension  Yes    Intervention  Monitor prescription use compliance.;Provide education on lifestyle modifcations including regular physical activity/exercise, weight management, moderate sodium restriction and increased consumption of fresh fruit, vegetables, and low fat dairy, alcohol moderation, and  smoking cessation.    Expected Outcomes  Short Term: Continued assessment and intervention until BP is < 140/924mHG in hypertensive participants. < 130/8052mG in hypertensive participants with diabetes, heart failure or chronic kidney disease.;Long Term: Maintenance of blood pressure at goal levels.    Lipids  Yes    Intervention  Provide education and support for participant on nutrition & aerobic/resistive exercise along with prescribed medications to achieve LDL <69m71mDL >40mg59m Expected Outcomes  Short Term: Participant states understanding of desired cholesterol values and is compliant with medications prescribed. Participant is following exercise prescription and nutrition guidelines.;Long Term: Cholesterol controlled with medications as prescribed, with individualized exercise  RX and with personalized nutrition plan. Value goals: LDL < 88m, HDL > 40 mg.    Stress  Yes    Intervention  Offer individual and/or small group education and counseling on adjustment to heart disease, stress management and health-related lifestyle change. Teach and support self-help strategies.;Refer participants experiencing significant psychosocial distress to appropriate mental health specialists for further evaluation and treatment. When possible, include family members and significant others in education/counseling sessions.    Expected Outcomes  Short Term: Participant demonstrates changes in health-related behavior, relaxation and other stress management skills, ability to obtain effective social support, and compliance with psychotropic medications if prescribed.;Long Term: Emotional wellbeing is indicated by absence of clinically significant psychosocial distress or social isolation.           Core Components/Risk Factors/Patient Goals Review:  Goals and Risk Factor Review    Core Components/Risk Factors/Patient Goals Review    Row Name 05/10/18 1618 05/18/18 0824 06/15/18 1424 07/06/18 1228   Personal  Goals Review  Weight Management/Obesity;Hypertension;Lipids;Stress  Weight Management/Obesity;Hypertension;Lipids;Stress  Weight Management/Obesity;Hypertension;Lipids;Stress  Weight Management/Obesity;Hypertension;Lipids;Stress   Review  pt with multiple CAD RF demonstrates eagerness to participate in CR program.  pt personal goals are to improve dietary habits, lose weight and have an enthusiastic outlook on life.   pt with multiple CAD RF demonstrates eagerness to participate in CR program.  pt personal goals are to improve dietary habits, lose weight and have an enthusiastic outlook on life.   pt with multiple CAD RF demonstrates eagerness to participate in CR program.  pt personal goals are to improve dietary habits, lose weight and have an enthusiastic outlook on life. 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 pt projects improved self confidence.   pt with multiple CAD RF demonstrates eagerness to participate in CR program.  pt personal goals are to improve dietary habits, lose weight and have an enthusiastic outlook on life. pt is walking at home and has returned to work without physical  difficulty.     Expected Outcomes  pt will participate in CR exercise, nutrition and lifestyle modification to decrease overall RF and improve quality of life.   pt will participate in CR exercise, nutrition and lifestyle modification to decrease overall RF and improve quality of life.   pt will participate in CR exercise, nutrition and lifestyle modification to decrease overall RF and improve quality of life.   pt will participate in CR exercise, nutrition and lifestyle modification to decrease overall RF and improve quality of life.           Core Components/Risk Factors/Patient Goals at Discharge (Final Review):  Goals and Risk Factor Review - 07/06/18 1228    Core Components/Risk Factors/Patient Goals Review          Personal Goals Review  Weight  Management/Obesity;Hypertension;Lipids;Stress    Review  pt with multiple CAD RF demonstrates eagerness to participate in CR program.  pt personal goals are to improve dietary habits, lose weight and have an enthusiastic outlook on life. pt is walking at home and has returned to work without physical  difficulty.      Expected Outcomes  pt will participate in CR exercise, nutrition and lifestyle modification to decrease overall RF and improve quality of life.            ITP Comments: ITP Comments    Row Name 05/02/18 1448 05/04/18 1030 05/10/18 1430 05/18/18 0821 06/15/18 1422   ITP Comments  Medical Director-Dr. TFransico Him  Medical Director-Dr. Fransico Him   pt started group exercise. pt tolerated light activity without difficulty. pt oriented to exercise equipment and safety routine.   30 day ITP review.   30 day ITP. pt with good participation and attendance. pt demonstrates increasing eagerness to participate in CR activities.   [place] Name 07/06/18 1226   ITP Comments  30 day ITP. pt with good participation and attendance. pt demonstrates increasing eagerness to participate in CR activities.      Comments:

## 2018-07-07 ENCOUNTER — Encounter (HOSPITAL_COMMUNITY)
Admission: RE | Admit: 2018-07-07 | Discharge: 2018-07-07 | Disposition: A | Discharge status: Home / Self Care | Payer: PRIVATE HEALTH INSURANCE | Source: Ambulatory Visit | Attending: Cardiovascular Disease | Admitting: Cardiovascular Disease

## 2018-07-07 DIAGNOSIS — Z955 Presence of coronary angioplasty implant and graft: Secondary | ICD-10-CM

## 2018-07-07 LAB — COMPREHENSIVE METABOLIC PANEL
A/G RATIO: 2.3 — AB (ref 1.2–2.2)
ALBUMIN: 4.6 g/dL (ref 3.6–4.8)
ALK PHOS: 151 IU/L — AB (ref 39–117)
ALT: 55 IU/L — ABNORMAL HIGH (ref 0–32)
AST: 40 IU/L (ref 0–40)
BUN / CREAT RATIO: 11 — AB (ref 12–28)
BUN: 10 mg/dL (ref 8–27)
Bilirubin Total: 1.7 mg/dL — ABNORMAL HIGH (ref 0.0–1.2)
CO2: 23 mmol/L (ref 20–29)
Calcium: 9.7 mg/dL (ref 8.7–10.3)
Chloride: 101 mmol/L (ref 96–106)
Creatinine, Ser: 0.9 mg/dL (ref 0.57–1.00)
GFR calc Af Amer: 77 mL/min/{1.73_m2} (ref >59)
GFR, EST NON AFRICAN AMERICAN: 67 mL/min/{1.73_m2} (ref >59)
GLOBULIN, TOTAL: 2 g/dL (ref 1.5–4.5)
Glucose: 90 mg/dL (ref 65–99)
POTASSIUM: 4.7 mmol/L (ref 3.5–5.2)
SODIUM: 139 mmol/L (ref 134–144)
Total Protein: 6.6 g/dL (ref 6.0–8.5)

## 2018-07-07 LAB — CBC
Hematocrit: 38.4 % (ref 34.0–46.6)
Hemoglobin: 12.9 g/dL (ref 11.1–15.9)
MCH: 31.2 pg (ref 26.6–33.0)
MCHC: 33.6 g/dL (ref 31.5–35.7)
MCV: 93 fL (ref 79–97)
PLATELETS: 247 10*3/uL (ref 150–450)
RBC: 4.13 x10E6/uL (ref 3.77–5.28)
RDW: 13 % (ref 12.3–15.4)
WBC: 5.9 10*3/uL (ref 3.4–10.8)

## 2018-07-07 LAB — LIPID PANEL
CHOLESTEROL TOTAL: 109 mg/dL (ref 100–199)
Chol/HDL Ratio: 2.9 ratio (ref 0.0–4.4)
HDL: 38 mg/dL — ABNORMAL LOW (ref >39)
LDL Calculated: 47 mg/dL (ref 0–99)
Triglycerides: 120 mg/dL (ref 0–149)
VLDL Cholesterol Cal: 24 mg/dL (ref 5–40)

## 2018-07-07 LAB — HEMOGLOBIN A1C
ESTIMATED AVERAGE GLUCOSE: 120 mg/dL
Hgb A1c MFr Bld: 5.8 % — ABNORMAL HIGH (ref 4.8–5.6)

## 2018-07-07 NOTE — Telephone Encounter (Signed)
Labs ordered at cardiology.

## 2018-07-10 ENCOUNTER — Ambulatory Visit (HOSPITAL_COMMUNITY): Payer: PRIVATE HEALTH INSURANCE

## 2018-07-10 ENCOUNTER — Encounter (HOSPITAL_COMMUNITY)
Admission: RE | Admit: 2018-07-10 | Discharge: 2018-07-10 | Disposition: A | Discharge status: Home / Self Care | Payer: PRIVATE HEALTH INSURANCE | Source: Ambulatory Visit | Attending: Cardiovascular Disease | Admitting: Cardiovascular Disease

## 2018-07-10 VITALS — Ht 62.0 in | Wt 145.7 lb

## 2018-07-10 DIAGNOSIS — Z955 Presence of coronary angioplasty implant and graft: Secondary | ICD-10-CM

## 2018-07-10 DIAGNOSIS — I2510 Atherosclerotic heart disease of native coronary artery without angina pectoris: Secondary | ICD-10-CM | POA: Diagnosis not present

## 2018-07-10 NOTE — Progress Notes (Signed)
I have reviewed a Home Exercise Prescription with [name] . Dhruvi is currently exercising at home.  The patient was advised to walk 5-7 days a week for 30-45 minutes.  Mariann Laster and I discussed how to progress their exercise prescription.  The patient stated that their goals were to walk 3-4 days in addition to cardiac rehab.  The patient stated that they understand the exercise prescription.  We reviewed exercise guidelines, target heart rate during exercise, RPE Scale, weather conditions, NTG use, endpoints for exercise, warmup and cool down.  Patient is encouraged to come to me with any questions. I will continue to follow up with the patient to assist them with progression and safety.    07/10/2018 4:12 PM  Deitra Mayo BS, ACSM CEP

## 2018-07-10 NOTE — Telephone Encounter (Signed)
Pt returned call and I was able to review the sleep study with her. Advised there was no apnea present. Discussed that PLM was present and the patient states that it was typical of when she lays down how she has difficulty with restlessness. Reviewed with her and asked if she had started the neupro patch Dr Jaynee Eagles had sent at their first visit and the patient states she didn't because she was on so much she was waiting to rule other things out before starting a medication. I reviewed her lab work and saw where her ferritin level was low and was asked if she started the iron supplement. Patient said she hadn't. I educated the patient on starting that and seeing if she finds relief. Advised she would follow up with Dr Jaynee Eagles. Pt verbalized understanding. Pt had no questions at this time but was encouraged to call back if questions arise.

## 2018-07-12 ENCOUNTER — Other Ambulatory Visit: Payer: Self-pay | Admitting: *Deleted

## 2018-07-12 ENCOUNTER — Ambulatory Visit (HOSPITAL_COMMUNITY): Payer: PRIVATE HEALTH INSURANCE

## 2018-07-12 ENCOUNTER — Encounter (HOSPITAL_COMMUNITY)
Admission: RE | Admit: 2018-07-12 | Discharge: 2018-07-12 | Disposition: A | Discharge status: Home / Self Care | Payer: PRIVATE HEALTH INSURANCE | Source: Ambulatory Visit | Attending: Cardiovascular Disease | Admitting: Cardiovascular Disease

## 2018-07-12 DIAGNOSIS — Z79899 Other long term (current) drug therapy: Secondary | ICD-10-CM

## 2018-07-12 DIAGNOSIS — I2510 Atherosclerotic heart disease of native coronary artery without angina pectoris: Secondary | ICD-10-CM

## 2018-07-12 DIAGNOSIS — E785 Hyperlipidemia, unspecified: Secondary | ICD-10-CM

## 2018-07-12 DIAGNOSIS — Z955 Presence of coronary angioplasty implant and graft: Secondary | ICD-10-CM

## 2018-07-12 MED ORDER — ATORVASTATIN CALCIUM 80 MG PO TABS: 40.0000 mg | ORAL_TABLET | Freq: Every day | ORAL | Status: DC

## 2018-07-14 ENCOUNTER — Encounter (HOSPITAL_COMMUNITY): Payer: PRIVATE HEALTH INSURANCE

## 2018-07-17 ENCOUNTER — Ambulatory Visit (HOSPITAL_COMMUNITY): Payer: PRIVATE HEALTH INSURANCE

## 2018-07-17 ENCOUNTER — Encounter (HOSPITAL_COMMUNITY)
Admission: RE | Admit: 2018-07-17 | Discharge: 2018-07-17 | Disposition: A | Discharge status: Home / Self Care | Payer: PRIVATE HEALTH INSURANCE | Source: Ambulatory Visit | Attending: Cardiovascular Disease | Admitting: Cardiovascular Disease

## 2018-07-17 DIAGNOSIS — Z79899 Other long term (current) drug therapy: Secondary | ICD-10-CM | POA: Diagnosis not present

## 2018-07-17 DIAGNOSIS — M810 Age-related osteoporosis without current pathological fracture: Secondary | ICD-10-CM | POA: Diagnosis not present

## 2018-07-17 DIAGNOSIS — Z955 Presence of coronary angioplasty implant and graft: Secondary | ICD-10-CM

## 2018-07-17 DIAGNOSIS — Z7982 Long term (current) use of aspirin: Secondary | ICD-10-CM | POA: Diagnosis not present

## 2018-07-17 DIAGNOSIS — K219 Gastro-esophageal reflux disease without esophagitis: Secondary | ICD-10-CM | POA: Diagnosis not present

## 2018-07-17 DIAGNOSIS — I2510 Atherosclerotic heart disease of native coronary artery without angina pectoris: Secondary | ICD-10-CM | POA: Diagnosis not present

## 2018-07-17 DIAGNOSIS — I10 Essential (primary) hypertension: Secondary | ICD-10-CM | POA: Insufficient documentation

## 2018-07-17 DIAGNOSIS — E785 Hyperlipidemia, unspecified: Secondary | ICD-10-CM | POA: Insufficient documentation

## 2018-07-17 DIAGNOSIS — Z853 Personal history of malignant neoplasm of breast: Secondary | ICD-10-CM | POA: Diagnosis not present

## 2018-07-17 DIAGNOSIS — F329 Major depressive disorder, single episode, unspecified: Secondary | ICD-10-CM | POA: Insufficient documentation

## 2018-07-19 ENCOUNTER — Encounter (HOSPITAL_COMMUNITY)
Admission: RE | Admit: 2018-07-19 | Discharge: 2018-07-19 | Disposition: A | Discharge status: Home / Self Care | Payer: PRIVATE HEALTH INSURANCE | Source: Ambulatory Visit | Attending: Cardiovascular Disease | Admitting: Cardiovascular Disease

## 2018-07-19 ENCOUNTER — Ambulatory Visit (HOSPITAL_COMMUNITY): Payer: PRIVATE HEALTH INSURANCE

## 2018-07-19 DIAGNOSIS — Z955 Presence of coronary angioplasty implant and graft: Secondary | ICD-10-CM

## 2018-07-19 DIAGNOSIS — I2510 Atherosclerotic heart disease of native coronary artery without angina pectoris: Secondary | ICD-10-CM | POA: Diagnosis not present

## 2018-07-21 ENCOUNTER — Encounter (HOSPITAL_COMMUNITY)
Admission: RE | Admit: 2018-07-21 | Discharge: 2018-07-21 | Disposition: A | Discharge status: Home / Self Care | Payer: PRIVATE HEALTH INSURANCE | Source: Ambulatory Visit | Attending: Cardiovascular Disease | Admitting: Cardiovascular Disease

## 2018-07-21 DIAGNOSIS — Z955 Presence of coronary angioplasty implant and graft: Secondary | ICD-10-CM

## 2018-07-21 DIAGNOSIS — I2510 Atherosclerotic heart disease of native coronary artery without angina pectoris: Secondary | ICD-10-CM | POA: Diagnosis not present

## 2018-07-24 ENCOUNTER — Ambulatory Visit (HOSPITAL_COMMUNITY): Payer: PRIVATE HEALTH INSURANCE

## 2018-07-24 ENCOUNTER — Encounter (HOSPITAL_COMMUNITY): Payer: PRIVATE HEALTH INSURANCE

## 2018-07-24 ENCOUNTER — Telehealth (HOSPITAL_COMMUNITY): Payer: Self-pay | Admitting: *Deleted

## 2018-07-24 NOTE — Telephone Encounter (Signed)
Pt called out today for cardiac rehab.  Pt stated that she has been in the bed all weekend with a bad cold.  Pt denied any fever or other complaints.  Pt hopes to return to rehab later this week. Cherre Huger, BSN Cardiac and Training and development officer

## 2018-07-26 ENCOUNTER — Telehealth (HOSPITAL_COMMUNITY): Payer: Self-pay | Admitting: Family Medicine

## 2018-07-26 ENCOUNTER — Encounter (HOSPITAL_COMMUNITY): Payer: PRIVATE HEALTH INSURANCE

## 2018-07-26 ENCOUNTER — Ambulatory Visit (HOSPITAL_COMMUNITY): Payer: PRIVATE HEALTH INSURANCE

## 2018-07-26 NOTE — Progress Notes (Signed)
Cardiac Individual Treatment Plan  Patient Details  Name: [name] MRN: [id number] Date of Birth: Aug 17, 1950 Referring Provider:   Flowsheet Row CARDIAC REHAB PHASE II ORIENTATION from 05/04/2018 in [place]  Referring Provider  Troy Sine MD       Initial Encounter Date:  [place] PHASE II ORIENTATION from 05/04/2018 in [place]  Date  05/04/18      Visit Diagnosis: 07/23/2019Stented coronary artery  Patient's Home Medications on Admission:  Current Outpatient Medications:  .  albuterol (PROVENTIL HFA;VENTOLIN HFA) 108 (90 Base) MCG/ACT inhaler, Inhale 1-2 puffs into the lungs every 4 (four) hours as needed for wheezing or shortness of breath., Disp: 1 Inhaler, Rfl: 0 .  amLODipine (NORVASC) 2.5 MG tablet, Take 1 tablet (2.5 mg total) by mouth daily., Disp: 90 tablet, Rfl: 3 .  aspirin EC 81 MG tablet, Take 1 tablet (81 mg total) by mouth daily., Disp: 90 tablet, Rfl: 3 .  atorvastatin (LIPITOR) 80 MG tablet, Take 0.5 tablets (40 mg total) by mouth daily at 6 PM., Disp: , Rfl:  .  cetirizine (ZYRTEC) 10 MG tablet, Take 10 mg by mouth daily as needed for allergies. , Disp: , Rfl:  .  FLUoxetine (PROZAC) 20 MG tablet, Take 2 tablets (40 mg total) by mouth daily., Disp: 180 tablet, Rfl: 1 .  fluticasone (FLONASE) 50 MCG/ACT nasal spray, Place 2 sprays into both nostrils daily., Disp: 16 g, Rfl: 6 .  metoprolol succinate (TOPROL XL) 25 MG 24 hr tablet, Take 0.5 tablets (12.5 mg total) by mouth daily., Disp: 30 tablet, Rfl: 6 .  nitroGLYCERIN (NITROSTAT) 0.4 MG SL tablet, PLACE 1 TABLET (0.4 MG TOTAL) UNDER THE TONGUE EVERY 5 (FIVE) MINUTES AS NEEDED FOR CHEST PAIN., Disp: , Rfl: 3 .  Probiotic Product (ALIGN) 4 MG CAPS, Take 1 capsule by mouth daily., Disp: , Rfl:  .  ticagrelor (BRILINTA) 90 MG TABS tablet, Take 1 tablet (90 mg total) by mouth 2 (two) times daily., Disp: 180 tablet, Rfl: 3 .   traZODone (DESYREL) 50 MG tablet, TAKE 1/2 TO 1 TABLET BY MOUTH AT BEDTIME AS NEEDED FOR SLEEP, Disp: 30 tablet, Rfl: 0  Past Medical History: Past Medical History:  Diagnosis Date  . Allergy    "dust mite residue, mold, mildew, grapefruit, chili peppers, cat dander, some weeds" (03/07/2018)  . Anxiety   . Arthritis    "minor; right knee, left hip" (03/07/2018)  . Bruises easily   . Cancer of left breast ([place])    double mastectomy 01/26/2011  . Childhood asthma   . Contact lens/glasses fitting   . Coronary artery disease    a.  She underwent LHC 7/23 which revealed 95% stenosis of proximal LAD, managed with PCI/DES.   . Depression   . Essential hypertension   . GERD (gastroesophageal reflux disease)    "at times" (03/07/2018)  . Heart murmur   . Hiatal hernia   . High cholesterol   . History of kidney stones    "lots; no OR" (03/07/2018)  . Osteoporosis   . Rectal bleeding    hemorroid  . Ringing in ears, bilateral    "since ~ 06/2017" (03/07/2018)  . Sinus drainage     Tobacco Use: Social History   Tobacco Use  Smoking Status Never Smoker  Smokeless Tobacco Never Used    Labs: Recent Review Scientist, physiological    Labs for ITP Cardiac and Pulmonary Rehab  Latest Ref Rng & Units 01/17/2016 07/22/2016 09/24/2017 03/06/2018 07/06/2018   Cholestrol 100 - 199 mg/dL 163 201(H) 249(H) 142 109   LDLCALC 0 - 99 mg/dL 84 126(H) 174(H) 72 47   HDL >39 mg/dL 42(L) 46 45 39(L) 38(L)   Trlycerides 0 - 149 mg/dL 184(H) 143 148 153(H) 120   Hemoglobin A1c 4.8 - 5.6 % - 5.6 5.7(H) - 5.8(H)   TCO2 0 - 100 mmol/L - - - - -      Capillary Blood Glucose: No results found for: GLUCAP   Exercise Target Goals: Exercise Program Goal: Individual exercise prescription set using results from initial 6 min walk test and THRR while considering  patient's activity barriers and safety.   Exercise Prescription Goal: Initial exercise prescription builds to 30-45 minutes a day of aerobic activity, 2-3  days per week.  Home exercise guidelines will be given to patient during program as part of exercise prescription that the participant will acknowledge.  Activity Barriers & Risk Stratification: Activity Barriers & Cardiac Risk Stratification - 05/04/18 1103    Activity Barriers & Cardiac Risk Stratification          Activity Barriers  Deconditioning    Cardiac Risk Stratification  Moderate           6 Minute Walk: 6 Minute Walk    6 Minute Walk    Row Name 05/04/18 1101 07/10/18 1457   Phase  Initial  Discharge   Distance  1600 feet  1980 feet   Distance % Change  no documentation  23.75 %   Distance Feet Change  no documentation  380 ft   Walk Time  6 minutes  6 minutes   # of Rest Breaks  0  0   MPH  3.03  3.75   METS  3.39  4.19   RPE  12  12   Perceived Dyspnea   0  no documentation   VO2 Peak  11.87  14.67   Symptoms  No  No   Resting HR  67 bpm  56 bpm   Resting BP  108/60  120/70   Resting Oxygen Saturation   98 %  no documentation   Exercise Oxygen Saturation  during 6 min walk  98 %  no documentation   Max Ex. HR  90 bpm  95 bpm   Max Ex. BP  118/60  130/64   2 Minute Post BP  98/60  104/64          Oxygen Initial Assessment:   Oxygen Re-Evaluation:   Oxygen Discharge (Final Oxygen Re-Evaluation):   Initial Exercise Prescription: Initial Exercise Prescription - 05/04/18 1100    Date of Initial Exercise RX and Referring Provider          Date  05/04/18    Referring Provider  Troy Sine MD     Expected Discharge Date  08/02/18        Recumbant Bike          Level  1.5    Watts  25    Minutes  10    METs  3.21        NuStep          Level  2    SPM  75    Minutes  10    METs  3        Track          Laps  12    Minutes  10    METs  3.07        Prescription Details          Frequency (times per week)  3x    Duration  Progress to 30 minutes of continuous aerobic without signs/symptoms of physical distress         Intensity          THRR 40-80% of Max Heartrate  62-123    Ratings of Perceived Exertion  11-13    Perceived Dyspnea  0-4        Progression          Progression  Continue progressive overload as per policy without signs/symptoms or physical distress.        Resistance Training          Training Prescription  Yes    Weight  3lbs    Reps  10-15           Perform Capillary Blood Glucose checks as needed.  Exercise Prescription Changes: Exercise Prescription Changes    Response to Exercise    Row Name 05/10/18 1326 05/15/18 1324 05/29/18 1134 06/14/18 1548 07/04/18 1526   Blood Pressure (Admit)  108/60  114/58  110/62  104/56  120/74   Blood Pressure (Exercise)  120/60  138/60  110/60  118/68  128/58   Blood Pressure (Exit)  100/58  110/52  100/62  98/60  100/60   Heart Rate (Admit)  75 bpm  67 bpm  94 bpm  72 bpm  69 bpm   Heart Rate (Exercise)  119 bpm  97 bpm  102 bpm  107 bpm  112 bpm   Heart Rate (Exit)  72 bpm  67 bpm  64 bpm  65 bpm  75 bpm   Rating of Perceived Exertion (Exercise)  12  11  12  12  12    Symptoms  none  none  none  none  none   Duration  Progress to 30 minutes of  aerobic without signs/symptoms of physical distress  Progress to 30 minutes of  aerobic without signs/symptoms of physical distress  Progress to 30 minutes of  aerobic without signs/symptoms of physical distress  Progress to 30 minutes of  aerobic without signs/symptoms of physical distress  Progress to 30 minutes of  aerobic without signs/symptoms of physical distress   Intensity  THRR unchanged  THRR unchanged  THRR unchanged  THRR unchanged  THRR unchanged       Progression    Row Name 05/10/18 1326 05/15/18 1324 05/29/18 1134 06/14/18 1548 07/04/18 1526   Progression  Continue to progress workloads to maintain intensity without signs/symptoms of physical distress.  Continue to progress workloads to maintain intensity without signs/symptoms of physical distress.  Continue to progress  workloads to maintain intensity without signs/symptoms of physical distress.  Continue to progress workloads to maintain intensity without signs/symptoms of physical distress.  Continue to progress workloads to maintain intensity without signs/symptoms of physical distress.   Average METs  2.7  3  3.96  3.48  3.7       Resistance Training    Row Name 05/10/18 1326 05/15/18 1324 05/29/18 1134 06/14/18 1548 07/04/18 1526   Training Prescription  No Relaxation day, no weights  Yes  Yes  No  Yes   Weight  no documentation  3lbs  3lbs  no documentation  3 lbs.    Reps  no documentation  10-15  10-15  no documentation  10-15   Time  no documentation  10 Minutes  10 Minutes  no documentation  10 Minutes       Interval Training    Row Name 05/10/18 1326 05/15/18 1324 05/29/18 1134 06/14/18 1548 07/04/18 1526   Interval Training  No  No  No  No  No       Recumbant Bike    Row Name 05/10/18 1326 05/15/18 1324 05/29/18 1134 06/14/18 1548 07/04/18 1526   Level  1.5  1.5  1.5  1.5  1.5   Watts  19  23  32  32  30   Minutes  10  10  10  10  10    METs  2.94  3.1  5.15  3.45  3.45       NuStep    Row Name 05/10/18 1326 05/15/18 1324 05/29/18 1134 06/14/18 1548 07/04/18 1526   Level  2  3  4  4  4    SPM  75  75  85  85  85   Minutes  10  10  10  10  10    METs  2  2.2  3.3  3.4  4.2       Track    Row Name 05/10/18 1326 05/15/18 1324 05/29/18 1134 06/14/18 1548 07/04/18 1526   Laps  13  16  14  15  16    Minutes  10  10  10  10  10    METs  3.26  3.79  3.44  3.6  3.44       Response to Exercise    Row Name 07/10/18 1612 07/24/18 1500   Blood Pressure (Admit)  120/74  108/64   Blood Pressure (Exercise)  128/58  140/64   Blood Pressure (Exit)  100/60  98/58   Heart Rate (Admit)  69 bpm  70 bpm   Heart Rate (Exercise)  112 bpm  111 bpm   Heart Rate (Exit)  75 bpm  70 bpm   Rating of Perceived Exertion (Exercise)  12  12   Symptoms  none  none   Duration  Progress to 30 minutes of  aerobic  without signs/symptoms of physical distress  Progress to 30 minutes of  aerobic without signs/symptoms of physical distress   Intensity  THRR unchanged  THRR unchanged       Progression    Row Name 07/10/18 1612 07/24/18 1500   Progression  Continue to progress workloads to maintain intensity without signs/symptoms of physical distress.  Continue to progress workloads to maintain intensity without signs/symptoms of physical distress.   Average METs  3.7  4       Resistance Training    Row Name 07/10/18 1612 07/24/18 1500   Training Prescription  Yes  Yes   Weight  3 lbs.   3 lbs.    Reps  10-15  10-15   Time  10 Minutes  10 Minutes       Interval Training    Row Name 07/10/18 1612 07/24/18 1500   Interval Training  No  No       Recumbant Bike    Row Name 07/10/18 1612 07/24/18 1500   Level  1.5  1.5   Watts  30  30   Minutes  10  10   METs  3.45  3.45       NuStep    Row Name 07/10/18 1612 07/24/18 1500   Level  4  4   SPM  85  85   Minutes  10  10   METs  4.2  4.9       Track    Row Name 07/10/18 1612 07/24/18 1500   Laps  16  13   Minutes  10  10   METs  3.44  3.44       Home Exercise Plan    Row Name 07/10/18 1612 07/24/18 1500   Plans to continue exercise at  Home (comment)  Home (comment)   Frequency  Add 3 additional days to program exercise sessions.  Add 3 additional days to program exercise sessions.   Initial Home Exercises Provided  05/19/18  05/19/18          Exercise Comments: Exercise Comments    Row Name 05/15/18 1358 05/19/18 1350 05/31/18 1621 06/14/18 1552 07/05/18 1501   Exercise Comments  Reviewed METs and goals with patient.  Reviewed home exercise guidelines with patient.  Reviewed METs and goals with Pt.   Pt continues to progress with exercise and increase MET level with each session.   Reviewed METs and goals with Pt. Will continue exercising at home.    [place] Name 07/26/18 1033   Exercise Comments  Reviewed METs and goals with Pt.  Will continue exercising at home.       Exercise Goals and Review: Exercise Goals    Exercise Goals    Row Name 05/04/18 1103   Increase Physical Activity  Yes   Intervention  Provide advice, education, support and counseling about physical activity/exercise needs.;Develop an individualized exercise prescription for aerobic and resistive training based on initial evaluation findings, risk stratification, comorbidities and participant's personal goals.   Expected Outcomes  Short Term: Attend rehab on a regular basis to increase amount of physical activity.;Long Term: Exercising regularly at least 3-5 days a week.;Long Term: Add in home exercise to make exercise part of routine and to increase amount of physical activity.   Increase Strength and Stamina  Yes   Intervention  Provide advice, education, support and counseling about physical activity/exercise needs.;Develop an individualized exercise prescription for aerobic and resistive training based on initial evaluation findings, risk stratification, comorbidities and participant's personal goals.   Expected Outcomes  Short Term: Increase workloads from initial exercise prescription for resistance, speed, and METs.;Short Term: Perform resistance training exercises routinely during rehab and add in resistance training at home;Long Term: Improve cardiorespiratory fitness, muscular endurance and strength as measured by increased METs and functional capacity (6MWT)   Able to understand and use rate of perceived exertion (RPE) scale  Yes   Intervention  Provide education and explanation on how to use RPE scale   Expected Outcomes  Short Term: Able to use RPE daily in rehab to express subjective intensity level;Long Term:  Able to use RPE to guide intensity level when exercising independently   Knowledge and understanding of Target Heart Rate Range (THRR)  Yes   Intervention  Provide education and explanation of THRR including how the numbers were  predicted and where they are located for reference   Expected Outcomes  Short Term: Able to state/look up THRR;Long Term: Able to use THRR to govern intensity when exercising independently;Short Term: Able to use daily as guideline for intensity in rehab   Able to check pulse independently  Yes   Intervention  Provide education and demonstration on how to check pulse in carotid and radial arteries.;Review the importance of being able to check your own pulse for safety during independent exercise  Expected Outcomes  Short Term: Able to explain why pulse checking is important during independent exercise;Long Term: Able to check pulse independently and accurately   Understanding of Exercise Prescription  Yes   Intervention  Provide education, explanation, and written materials on patient's individual exercise prescription   Expected Outcomes  Short Term: Able to explain program exercise prescription;Long Term: Able to explain home exercise prescription to exercise independently          Exercise Goals Re-Evaluation : Exercise Goals Re-Evaluation    Exercise Goal Re-Evaluation    Row Name 05/15/18 1358 05/19/18 1350 05/31/18 1620 06/14/18 1550 07/05/18 1459   Exercise Goals Review  Able to understand and use rate of perceived exertion (RPE) scale;Increase Physical Activity  Able to understand and use rate of perceived exertion (RPE) scale;Increase Physical Activity;Understanding of Exercise Prescription;Knowledge and understanding of Target Heart Rate Range (THRR)  Increase Physical Activity;Increase Strength and Stamina;Able to check pulse independently;Knowledge and understanding of Target Heart Rate Range (THRR);Able to understand and use rate of perceived exertion (RPE) scale;Understanding of Exercise Prescription  Increase Physical Activity;Increase Strength and Stamina;Able to check pulse independently;Knowledge and understanding of Target Heart Rate Range (THRR);Able to understand and use rate  of perceived exertion (RPE) scale;Understanding of Exercise Prescription  Increase Physical Activity;Increase Strength and Stamina;Able to check pulse independently;Understanding of Exercise Prescription;Knowledge and understanding of Target Heart Rate Range (THRR);Able to understand and use rate of perceived exertion (RPE) scale   Comments  Patient is walking 1 day in addition to exercise at cardiac rehab. Pt understands and is able to use RPE scale appropriately.  Reviewed home exercise guidelines with patient including THRR, RPE scale, endpoints for exercise. Pt is walking as her mode of home exercise.  Reviewed METs and goals with Pt. Will continue to walk 30 minutes per day in addition to CR exercise.   Pt is tolerating exercise well. Continues to increase MET level. Continues to exercise at home in addition to Cardiac Rehab.   Reviewed METs and goals with Pt. Pt MET level is 3.7 and continues to increase. Pt continues to wlak for 30 minutes 2 days per week in addition to cardiac rehab.    Expected Outcomes  Increase workloads as tolerated to help increase energy and stamina.  Pt will increase walking to 2-3 days/week at home in addition to exercise at cardiac rehab.  Will continue to monitor and progress Pt as tolerated.   Will continue to monitor and progress Pt as tolerated.   Will continue to monitor and progress Pt as tolerated.        Exercise Goal Re-Evaluation    Row Name 07/26/18 1033   Exercise Goals Review  Increase Physical Activity;Increase Strength and Stamina;Able to check pulse independently;Understanding of Exercise Prescription;Knowledge and understanding of Target Heart Rate Range (THRR);Able to understand and use rate of perceived exertion (RPE) scale   Comments  Reviewed METs and goals with Pt. Pt MET level is 4.0 and continues to increase. Pt continues to wlak for 30 minutes 2 days per week in addition to cardiac rehab.    Expected Outcomes  Will continue to monitor and progress  Pt as tolerated.           Discharge Exercise Prescription (Final Exercise Prescription Changes): Exercise Prescription Changes - 07/24/18 1500    Response to Exercise          Blood Pressure (Admit)  108/64    Blood Pressure (Exercise)  140/64    Blood Pressure (Exit)  98/58    Heart Rate (Admit)  70 bpm    Heart Rate (Exercise)  111 bpm    Heart Rate (Exit)  70 bpm    Rating of Perceived Exertion (Exercise)  12    Symptoms  none    Duration  Progress to 30 minutes of  aerobic without signs/symptoms of physical distress    Intensity  THRR unchanged        Progression          Progression  Continue to progress workloads to maintain intensity without signs/symptoms of physical distress.    Average METs  4        Resistance Training          Training Prescription  Yes    Weight  3 lbs.     Reps  10-15    Time  10 Minutes        Interval Training          Interval Training  No        Recumbant Bike          Level  1.5    Watts  30    Minutes  10    METs  3.45        NuStep          Level  4    SPM  85    Minutes  10    METs  4.9        Track          Laps  13    Minutes  10    METs  3.44        Home Exercise Plan          Plans to continue exercise at  Home (comment)    Frequency  Add 3 additional days to program exercise sessions.    Initial Home Exercises Provided  05/19/18           Nutrition:  Target Goals: Understanding of nutrition guidelines, daily intake of sodium <1540m, cholesterol <2045m calories 30% from fat and 7% or less from saturated fats, daily to have 5 or more servings of fruits and vegetables.  Biometrics: Pre Biometrics - 05/04/18 1102    Pre Biometrics          Height  5' 2"  (1.575 m)    Weight  66.6 kg    Waist Circumference  35 inches    Hip Circumference  40 inches    Waist to Hip Ratio  0.88 %    BMI (Calculated)  26.85    Triceps Skinfold  28 mm    % Body Fat  38.7 %    Grip Strength  30 kg     Flexibility  15 in    Single Leg Stand  30 seconds          Post Biometrics - 07/10/18 1458     Post  Biometrics          Height  5' 2"  (1.575 m)    Weight  66.1 kg    Waist Circumference  34 inches    Hip Circumference  40 inches    Waist to Hip Ratio  0.85 %    BMI (Calculated)  26.65    Triceps Skinfold  30 mm    % Body Fat  38.7 %    Grip Strength  26 kg    Flexibility  15 in    Single Leg Stand  18.56 seconds           Nutrition Therapy Plan and Nutrition Goals: Nutrition Therapy & Goals - 05/04/18 0920    Nutrition Therapy          Diet  heart healthy, carb modified        Personal Nutrition Goals          Nutrition Goal  pt to identify and limit food sources of saturated fat, trans fat, sodium, refined carbohydrates    Personal Goal #2  pt to identify food quantities necessary to achieve weight loss of 6-24 lbs at graduation from cardiac rehab        [place], educate and counsel regarding individualized specific dietary modifications aiming towards targeted core components such as weight, hypertension, lipid management, diabetes, heart failure and other comorbidities.    Expected Outcomes  Short Term Goal: Understand basic principles of dietary content, such as calories, fat, sodium, cholesterol and nutrients.;Long Term Goal: Adherence to prescribed nutrition plan.           Nutrition Assessments: Nutrition Assessments - 05/04/18 0921    MEDFICTS Scores          Pre Score  27           Nutrition Goals Re-Evaluation:   Nutrition Goals Re-Evaluation:   Nutrition Goals Discharge (Final Nutrition Goals Re-Evaluation):   Psychosocial: Target Goals: Acknowledge presence or absence of significant depression and/or stress, maximize coping skills, provide positive support system. Participant is able to verbalize types and ability to use techniques and skills needed for reducing stress and  depression.  Initial Review & Psychosocial Screening: Initial Psych Review & Screening - 05/04/18 1122    Initial Review          Current issues with  Current Stress Concerns;Current Anxiety/Panic    Source of Stress Concerns  Poor Coping Skills;Occupation        Family Dynamics          [place]?  Yes   Pt lists her friends and ex-husband as sources of support.        Barriers          Psychosocial barriers to participate in program  The patient should benefit from training in stress management and relaxation.        Screening Interventions          Interventions  Encouraged to exercise;To provide support and resources with identified psychosocial needs;Provide feedback about the scores to participant    Expected Outcomes  Short Term goal: Utilizing psychosocial counselor, staff and physician to assist with identification of specific Stressors or current issues interfering with healing process. Setting desired goal for each stressor or current issue identified.           Quality of Life Scores: Quality of Life - 05/04/18 1059    Quality of Life          Select  Quality of Life        Quality of Life Scores          Health/Function Pre  13.93 %    Socioeconomic Pre  11.92 %    Psych/Spiritual Pre  8.36 %    Family Pre  15.67 %    GLOBAL Pre  12.4 %          Scores of 19 and below usually indicate a poorer quality of life in these areas.  A difference of  2-3 points is a clinically meaningful difference.  A difference of 2-3 points in the total score of the Quality of Life Index has been associated with significant improvement in overall quality of life, self-image, physical symptoms, and general health in studies assessing change in quality of life.  PHQ-9: Recent Review Flowsheet Data    Depression screen [facility] 2/9 05/30/2018 05/16/2018 05/10/2018 04/26/2018 04/14/2018   Decreased Interest 1 1 2  0 1   Down, Depressed, Hopeless 1 1 2  0 1   PHQ - 2 Score 2 2  4  0 2   Altered sleeping 3 2 1  - 0   Tired, decreased energy 2 2 1  - 0   Change in appetite 1 1 1  - 0   Feeling bad or failure about yourself  1 2 2  - 0   Trouble concentrating 0 0 1 - 0   Moving slowly or fidgety/restless 0 0 1 - 0   Suicidal thoughts 0 0 0 - 0   PHQ-9 Score 9 9 11  - 2   Difficult doing work/chores Somewhat difficult Somewhat difficult Extremely dIfficult - -     Interpretation of Total Score  Total Score Depression Severity:  1-4 = Minimal depression, 5-9 = Mild depression, 10-14 = Moderate depression, 15-19 = Moderately severe depression, 20-27 = Severe depression   Psychosocial Evaluation and Intervention: Psychosocial Evaluation - 05/10/18 1449    Psychosocial Evaluation & Interventions          Interventions  Stress management education;Relaxation education;Encouraged to exercise with the program and follow exercise prescription;Therapist referral;Physician referral    Comments  pt demonstrates significant stress and anxiety from chronic illness and occupation. pt has taken medical leave of absence from work and took a restful vacation. pt is working with her PCP who has made referral to counselor.      Expected Outcomes  pt will exhibit improved outlook and coping skills.     Continue Psychosocial Services   Follow up required by staff           Psychosocial Re-Evaluation: Psychosocial Re-Evaluation    Psychosocial Re-Evaluation    [place] Name 05/18/18 0822 06/15/18 1423 07/06/18 1227 07/21/18 1539   Current issues with  Current Stress Concerns;Current Anxiety/Panic  Current Stress Concerns;Current Anxiety/Panic  Current Stress Concerns;Current Anxiety/Panic  Current Stress Concerns;Current Anxiety/Panic   Comments  pt with health related stress/anxiety. pt currently seeking care from therapist and PCP. pt participating in self care.    pt with health related stress/anxiety. pt currently seeking care from therapist and PCP. pt participating in self care.  pt  work LOA ends 06/22/2018.    pt with health related stress/anxiety. pt currently seeking care from therapist and PCP. pt participating in self care.  pt has returned to work. pt is demonstrating effective coping skills especially with added stress of job duty changes while out on leave.   pt with health related stress/anxiety. pt currently seeking care from therapist and PCP. pt participating in self care.  pt has returned to work. pt is demonstrating effective coping skills.  pt increasingly engaging with peers and CR staff, dancing and singing with her classmates.    Expected Outcomes  pt will exhibit improved outlook and coping skills.   pt will exhibit improved outlook and coping skills.   pt will exhibit improved outlook and coping skills.   pt will exhibit improved outlook and coping skills.    Interventions  Therapist referral;Physician referral;Relaxation education;Stress management education;Encouraged  to attend Cardiac Rehabilitation for the exercise  Therapist referral;Physician referral;Relaxation education;Stress management education;Encouraged to attend Cardiac Rehabilitation for the exercise  Therapist referral;Physician referral;Relaxation education;Stress management education;Encouraged to attend Cardiac Rehabilitation for the exercise  Therapist referral;Physician referral;Relaxation education;Stress management education;Encouraged to attend Cardiac Rehabilitation for the exercise   Continue Psychosocial Services   Follow up required by staff  Follow up required by staff  Follow up required by staff  Follow up required by staff       Initial Review    [place] Name 05/18/18 0822 06/15/18 1423 07/06/18 1227 07/21/18 1539   Source of Stress Concerns  Occupation;Poor Coping Skills  Occupation;Poor Coping Skills  no documentation  no documentation          Psychosocial Discharge (Final Psychosocial Re-Evaluation): Psychosocial Re-Evaluation - 07/21/18 1539    Psychosocial Re-Evaluation           Current issues with  Current Stress Concerns;Current Anxiety/Panic    Comments  pt with health related stress/anxiety. pt currently seeking care from therapist and PCP. pt participating in self care.  pt has returned to work. pt is demonstrating effective coping skills.  pt increasingly engaging with peers and CR staff, dancing and singing with her classmates.     Expected Outcomes  pt will exhibit improved outlook and coping skills.     Interventions  Therapist referral;Physician referral;Relaxation education;Stress management education;Encouraged to attend Cardiac Rehabilitation for the exercise    Continue Psychosocial Services   Follow up required by staff           Vocational Rehabilitation: Provide vocational rehab assistance to qualifying candidates.   Vocational Rehab Evaluation & Intervention: Vocational Rehab - 05/04/18 1135    Initial Vocational Rehab Evaluation & Intervention          Assessment shows need for Vocational Rehabilitation  Yes           Education: Education Goals: Education classes will be provided on a weekly basis, covering required topics. Participant will state understanding/return demonstration of topics presented.  Learning Barriers/Preferences: Learning Barriers/Preferences - 05/04/18 1101    Learning Barriers/Preferences          Learning Barriers  Sight    Learning Preferences  Skilled Demonstration;Verbal Instruction;Written Material           Education Topics: Count Your Pulse:  -Group instruction provided by verbal instruction, demonstration, patient participation and written materials to support subject.  Instructors address importance of being able to find your pulse and how to count your pulse when at home without a heart monitor.  Patients get hands on experience counting their pulse with staff help and individually. Flowsheet Row CARDIAC REHAB PHASE II EXERCISE from 07/12/2018 in [place]  Date   06/09/18  Instruction Review Code  2- Demonstrated Understanding      Heart Attack, Angina, and Risk Factor Modification:  -Group instruction provided by verbal instruction, video, and written materials to support subject.  Instructors address signs and symptoms of angina and heart attacks.    Also discuss risk factors for heart disease and how to make changes to improve heart health risk factors. Flowsheet Row CARDIAC REHAB PHASE II EXERCISE from 07/12/2018 in [place]  Date  05/17/18  Instruction Review Code  2- Demonstrated Understanding      Functional Fitness:  -Group instruction provided by verbal instruction, demonstration, patient participation, and written materials to support subject.  Instructors address safety measures for doing things  around the house.  Discuss how to get up and down off the floor, how to pick things up properly, how to safely get out of a chair without assistance, and balance training. Flowsheet Row CARDIAC REHAB PHASE II EXERCISE from 07/12/2018 in [place]  Date  06/30/18  Educator  EP  Instruction Review Code  2- Demonstrated Understanding      Meditation and Mindfulness:  -Group instruction provided by verbal instruction, patient participation, and written materials to support subject.  Instructor addresses importance of mindfulness and meditation practice to help reduce stress and improve awareness.  Instructor also leads participants through a meditation exercise.    Stretching for Flexibility and Mobility:  -Group instruction provided by verbal instruction, patient participation, and written materials to support subject.  Instructors lead participants through series of stretches that are designed to increase flexibility thus improving mobility.  These stretches are additional exercise for major muscle groups that are typically performed during regular warm up and cool down.   Hands  Only CPR:  -Group verbal, video, and participation provides a basic overview of AHA guidelines for community CPR. Role-play of emergencies allow participants the opportunity to practice calling for help and chest compression technique with discussion of AED use.   Hypertension: -Group verbal and written instruction that provides a basic overview of hypertension including the most recent diagnostic guidelines, risk factor reduction with self-care instructions and medication management. Flowsheet Row CARDIAC REHAB PHASE II EXERCISE from 07/12/2018 in [place]  Date  07/05/18  Instruction Review Code  2- Demonstrated Understanding       Nutrition I class: Heart Healthy Eating:  -Group instruction provided by PowerPoint slides, verbal discussion, and written materials to support subject matter. The instructor gives an explanation and review of the Therapeutic Lifestyle Changes diet recommendations, which includes a discussion on lipid goals, dietary fat, sodium, fiber, plant stanol/sterol esters, sugar, and the components of a well-balanced, healthy diet.   Nutrition II class: Lifestyle Skills:  -Group instruction provided by PowerPoint slides, verbal discussion, and written materials to support subject matter. The instructor gives an explanation and review of label reading, grocery shopping for heart health, heart healthy recipe modifications, and ways to make healthier choices when eating out.   Diabetes Question & Answer:  -Group instruction provided by PowerPoint slides, verbal discussion, and written materials to support subject matter. The instructor gives an explanation and review of diabetes co-morbidities, pre- and post-prandial blood glucose goals, pre-exercise blood glucose goals, signs, symptoms, and treatment of hypoglycemia and hyperglycemia, and foot care basics.   Diabetes Blitz:  -Group instruction provided by PowerPoint slides, verbal  discussion, and written materials to support subject matter. The instructor gives an explanation and review of the physiology behind type 1 and type 2 diabetes, diabetes medications and rational behind using different medications, pre- and post-prandial blood glucose recommendations and Hemoglobin A1c goals, diabetes diet, and exercise including blood glucose guidelines for exercising safely.    Portion Distortion:  -Group instruction provided by PowerPoint slides, verbal discussion, written materials, and food models to support subject matter. The instructor gives an explanation of serving size versus portion size, changes in portions sizes over the last 20 years, and what consists of a serving from each food group.   Stress Management:  -Group instruction provided by verbal instruction, video, and written materials to support subject matter.  Instructors review role of stress in heart disease and how to cope with stress positively.  Flowsheet Row CARDIAC REHAB PHASE II EXERCISE from 07/12/2018 in [place]  Date  06/21/18  Instruction Review Code  2- Demonstrated Understanding      Exercising on Your Own:  -Group instruction provided by verbal instruction, power point, and written materials to support subject.  Instructors discuss benefits of exercise, components of exercise, frequency and intensity of exercise, and end points for exercise.  Also discuss use of nitroglycerin and activating EMS.  Review options of places to exercise outside of rehab.  Review guidelines for sex with heart disease. Flowsheet Row CARDIAC REHAB PHASE II EXERCISE from 07/12/2018 in [place]  Date  05/31/18  Instruction Review Code  2- Demonstrated Understanding      Cardiac Drugs I:  -Group instruction provided by verbal instruction and written materials to support subject.  Instructor reviews cardiac drug classes: antiplatelets, anticoagulants,  beta blockers, and statins.  Instructor discusses reasons, side effects, and lifestyle considerations for each drug class. Flowsheet Row CARDIAC REHAB PHASE II EXERCISE from 07/12/2018 in [place]  Date  06/28/18  Educator  Pharmacist  Instruction Review Code  2- Demonstrated Understanding      Cardiac Drugs II:  -Group instruction provided by verbal instruction and written materials to support subject.  Instructor reviews cardiac drug classes: angiotensin converting enzyme inhibitors (ACE-I), angiotensin II receptor blockers (ARBs), nitrates, and calcium channel blockers.  Instructor discusses reasons, side effects, and lifestyle considerations for each drug class. Flowsheet Row CARDIAC REHAB PHASE II EXERCISE from 07/12/2018 in [place]  Date  05/24/18  Educator  Pharmacist  Instruction Review Code  2- Demonstrated Understanding      Anatomy and Physiology of the Circulatory System:  Group verbal and written instruction and models provide basic cardiac anatomy and physiology, with the coronary electrical and arterial systems. Review of: AMI, Angina, Valve disease, Heart Failure, Peripheral Artery Disease, Cardiac Arrhythmia, Pacemakers, and the ICD. Flowsheet Row CARDIAC REHAB PHASE II EXERCISE from 07/12/2018 in [place]  Date  07/12/18  Educator  RN  Instruction Review Code  1- Verbalizes Understanding      Other Education:  -Group or individual verbal, written, or video instructions that support the educational goals of the cardiac rehab program.   Holiday Eating Survival Tips:  -Group instruction provided by PowerPoint slides, verbal discussion, and written materials to support subject matter. The instructor gives patients tips, tricks, and techniques to help them not only survive but enjoy the holidays despite the onslaught of food that accompanies the holidays.   Knowledge  Questionnaire Score: Knowledge Questionnaire Score - 05/04/18 1059    Knowledge Questionnaire Score          Pre Score  21/24           Core Components/Risk Factors/Patient Goals at Admission: Personal Goals and Risk Factors at Admission - 05/04/18 1059    Core Components/Risk Factors/Patient Goals on Admission           Weight Management  Yes;Weight Loss    Intervention  Weight Management: Develop a combined nutrition and exercise program designed to reach desired caloric intake, while maintaining appropriate intake of nutrient and fiber, sodium and fats, and appropriate energy expenditure required for the weight goal.;Weight Management: Provide education and appropriate resources to help participant work on and attain dietary goals.;Weight Management/Obesity: Establish reasonable short term and long term weight goals.;Obesity: Provide education and appropriate resources  to help participant work on and attain dietary goals.    Admit Weight  146 lb 13.2 oz (66.6 kg)    Goal Weight: Short Term  140 lb (63.5 kg)    Goal Weight: Long Term  130 lb (59 kg)    Expected Outcomes  Short Term: Continue to assess and modify interventions until short term weight is achieved;Long Term: Adherence to nutrition and physical activity/exercise program aimed toward attainment of established weight goal;Weight Loss: Understanding of general recommendations for a balanced deficit meal plan, which promotes 1-2 lb weight loss per week and includes a negative energy balance of [phone] kcal/d;Understanding recommendations for meals to include 15-35% energy as protein, 25-35% energy from fat, 35-60% energy from carbohydrates, less than 22m of dietary cholesterol, 20-35 gm of total fiber daily;Understanding of distribution of calorie intake throughout the day with the consumption of 4-5 meals/snacks    Hypertension  Yes    Intervention  Monitor prescription use compliance.;Provide education on lifestyle modifcations  including regular physical activity/exercise, weight management, moderate sodium restriction and increased consumption of fresh fruit, vegetables, and low fat dairy, alcohol moderation, and smoking cessation.    Expected Outcomes  Short Term: Continued assessment and intervention until BP is < 140/940mHG in hypertensive participants. < 130/8024mG in hypertensive participants with diabetes, heart failure or chronic kidney disease.;Long Term: Maintenance of blood pressure at goal levels.    Lipids  Yes    Intervention  Provide education and support for participant on nutrition & aerobic/resistive exercise along with prescribed medications to achieve LDL <11m47mDL >40mg100m Expected Outcomes  Short Term: Participant states understanding of desired cholesterol values and is compliant with medications prescribed. Participant is following exercise prescription and nutrition guidelines.;Long Term: Cholesterol controlled with medications as prescribed, with individualized exercise RX and with personalized nutrition plan. Value goals: LDL < 11mg,62m > 40 mg.    Stress  Yes    Intervention  Offer individual and/or small group education and counseling on adjustment to heart disease, stress management and health-related lifestyle change. Teach and support self-help strategies.;Refer participants experiencing significant psychosocial distress to appropriate mental health specialists for further evaluation and treatment. When possible, include family members and significant others in education/counseling sessions.    Expected Outcomes  Short Term: Participant demonstrates changes in health-related behavior, relaxation and other stress management skills, ability to obtain effective social support, and compliance with psychotropic medications if prescribed.;Long Term: Emotional wellbeing is indicated by absence of clinically significant psychosocial distress or social isolation.           Core Components/Risk  Factors/Patient Goals Review:  Goals and Risk Factor Review    Core Components/Risk Factors/Patient Goals Review    [place] Name 05/10/18 1618 05/18/18 0824 06/15/18 1424 07/06/18 1228 07/21/18 1540   Personal Goals Review  Weight Management/Obesity;Hypertension;Lipids;Stress  Weight Management/Obesity;Hypertension;Lipids;Stress  Weight Management/Obesity;Hypertension;Lipids;Stress  Weight Management/Obesity;Hypertension;Lipids;Stress  Weight Management/Obesity;Hypertension;Lipids;Stress   Review  pt with multiple CAD RF demonstrates eagerness to participate in CR program.  pt personal goals are to improve dietary habits, lose weight and have an enthusiastic outlook on life.   pt with multiple CAD RF demonstrates eagerness to participate in CR program.  pt personal goals are to improve dietary habits, lose weight and have an enthusiastic outlook on life.   pt with multiple CAD RF demonstrates eagerness to participate in CR program.  pt personal goals are to improve dietary habits, lose weight and have an enthusiastic outlook on life. 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 pt projects improved self confidence.   pt with multiple CAD RF demonstrates eagerness to participate in CR program.  pt personal goals are to improve dietary habits, lose weight and have an enthusiastic outlook on life. pt is walking at home and has returned to work without physical  difficulty.    pt with multiple CAD RF demonstrates eagerness to participate in CR program.  pt personal goals are to improve dietary habits, lose weight and have an enthusiastic outlook on life. pt is walking at home and has returned to work without physical  difficulty.     Expected Outcomes  pt will participate in CR exercise, nutrition and lifestyle modification to decrease overall RF and improve quality of life.   pt will participate in CR exercise, nutrition and lifestyle modification to decrease overall RF and improve quality of life.    pt will participate in CR exercise, nutrition and lifestyle modification to decrease overall RF and improve quality of life.   pt will participate in CR exercise, nutrition and lifestyle modification to decrease overall RF and improve quality of life.   pt will participate in CR exercise, nutrition and lifestyle modification to decrease overall RF and improve quality of life.           Core Components/Risk Factors/Patient Goals at Discharge (Final Review):  Goals and Risk Factor Review - 07/21/18 1540    Core Components/Risk Factors/Patient Goals Review          Personal Goals Review  Weight Management/Obesity;Hypertension;Lipids;Stress    Review  pt with multiple CAD RF demonstrates eagerness to participate in CR program.  pt personal goals are to improve dietary habits, lose weight and have an enthusiastic outlook on life. pt is walking at home and has returned to work without physical  difficulty.      Expected Outcomes  pt will participate in CR exercise, nutrition and lifestyle modification to decrease overall RF and improve quality of life.            ITP Comments: ITP Comments    [place] Name 05/02/18 1448 05/04/18 1030 05/10/18 1430 05/18/18 0821 06/15/18 1422   ITP Comments  Medical Director-Dr. Fransico Him   Medical Director-Dr. Fransico Him   pt started group exercise. pt tolerated light activity without difficulty. pt oriented to exercise equipment and safety routine.   30 day ITP review.   30 day ITP. pt with good participation and attendance. pt demonstrates increasing eagerness to participate in CR activities.   [place] Name 07/06/18 1226 07/21/18 1539   ITP Comments  30 day ITP. pt with good participation and attendance. pt demonstrates increasing eagerness to participate in CR activities.  30 day ITP. pt with good participation and attendance. pt demonstrates increasing eagerness to participate in CR activities.      Comments:

## 2018-07-28 ENCOUNTER — Encounter (HOSPITAL_COMMUNITY): Payer: PRIVATE HEALTH INSURANCE

## 2018-07-29 ENCOUNTER — Encounter: Payer: Self-pay | Admitting: Family Medicine

## 2018-07-29 ENCOUNTER — Ambulatory Visit (INDEPENDENT_AMBULATORY_CARE_PROVIDER_SITE_OTHER): Payer: PRIVATE HEALTH INSURANCE | Admitting: Family Medicine

## 2018-07-29 VITALS — BP 103/62 | HR 79 | Temp 98.6°F | Resp 16 | Ht 62.0 in | Wt 143.2 lb

## 2018-07-29 DIAGNOSIS — J309 Allergic rhinitis, unspecified: Secondary | ICD-10-CM

## 2018-07-29 MED ORDER — METHYLPREDNISOLONE ACETATE 80 MG/ML IJ SUSP
80.0000 mg | Freq: Once | INTRAMUSCULAR | Status: AC
  Administered 2018-07-29: 80 mg via INTRAMUSCULAR

## 2018-07-29 MED ORDER — HYDROCOD POLST-CPM POLST ER 10-8 MG/5ML PO SUER: 5.0000 mL | Freq: Two times a day (BID) | ORAL | 0 refills | Status: DC | PRN

## 2018-07-29 MED ORDER — BENZONATATE 200 MG PO CAPS: 200.0000 mg | ORAL_CAPSULE | Freq: Three times a day (TID) | ORAL | 0 refills | Status: DC | PRN

## 2018-07-29 MED ORDER — AZITHROMYCIN 250 MG PO TABS: ORAL_TABLET | ORAL | 0 refills | Status: DC

## 2018-07-29 NOTE — Progress Notes (Signed)
Subjective:    Patient: [name]  DOB: 11-Apr-1951; 67 y.o.   MRN: [id number]  Chief Complaint  Patient presents with  . Cough    x1 week; producing yellow-green phlegm    HPI Supposed to singing a Xmas contada tomrorow. Last week she was pulling out a box of old scars - smelt veyr strongly of foul musty mold oldor - by Ludwig Clarks she started w/ PND but then on Mon after a wkend of Mucinex, could do 1/2 a day of Wed, then yesterday she couldn't even make it to practice. Doing simply saline, flonase several occasions, does mucinex over the weekend.  So much worse at night with tons of sinus pressure/pain, ear pain, whenever she bends over feels like her head is going to explode. This morning she was coughing and blowing out all sorts of mucous of a variety of colors.  Did have a little fever earlier in the week - 100.4  No other otc med since of her heart meds and avoided using the inhaler she had at home from prior the same thing.  Feels like throat is tight. Wheezing and really tired from coughing.   Normal GI/GU.   Had heart stent this summer.   Medical History Past Medical History:  Diagnosis Date  . Allergy    "dust mite residue, mold, mildew, grapefruit, chili peppers, cat dander, some weeds" (03/07/2018)  . Anxiety   . Arthritis    "minor; right knee, left hip" (03/07/2018)  . Bruises easily   . Cancer of left breast ([place])    double mastectomy 01/26/2011  . Childhood asthma   . Contact lens/glasses fitting   . Coronary artery disease    a.  She underwent LHC 7/23 which revealed 95% stenosis of proximal LAD, managed with PCI/DES.   . Depression   . Essential hypertension   . GERD (gastroesophageal reflux disease)    "at times" (03/07/2018)  . Heart murmur   . Hiatal hernia   . High cholesterol   . History of kidney stones    "lots; no OR" (03/07/2018)  . Osteoporosis   . Rectal bleeding    hemorroid  . Ringing in ears, bilateral    "since ~ 06/2017" (03/07/2018)  .  Sinus drainage    Past Surgical History:  Procedure Laterality Date  . APPENDECTOMY    . BREAST LUMPECTOMY W/ NEEDLE LOCALIZATION Left 10/05/2010   lumpectomy  . BREAST SURGERY    . CORONARY STENT INTERVENTION N/A 03/07/2018   Procedure: CORONARY STENT INTERVENTION;  Surgeon: Troy Sine, MD;  Location: [facility];  Service: Cardiovascular;  Laterality: N/A;  . LEFT HEART CATH AND CORONARY ANGIOGRAPHY N/A 03/07/2018   Procedure: LEFT HEART CATH AND CORONARY ANGIOGRAPHY;  Surgeon: Troy Sine, MD;  Location: [facility];  Service: Cardiovascular;  Laterality: N/A;  . MASTECTOMY Right 01/26/2011  . MASTECTOMY COMPLETE / SIMPLE W/ SENTINEL NODE BIOPSY Left 01/26/2011  . OVARIAN CYST SURGERY  1997, 2001, 2008   Current Outpatient Medications on File Prior to Visit  Medication Sig Dispense Refill  . albuterol (PROVENTIL HFA;VENTOLIN HFA) 108 (90 Base) MCG/ACT inhaler Inhale 1-2 puffs into the lungs every 4 (four) hours as needed for wheezing or shortness of breath. 1 Inhaler 0  . amLODipine (NORVASC) 2.5 MG tablet Take 1 tablet (2.5 mg total) by mouth daily. 90 tablet 3  . aspirin EC 81 MG tablet Take 1 tablet (81 mg total) by mouth daily. 90 tablet  3  . atorvastatin (LIPITOR) 80 MG tablet Take 0.5 tablets (40 mg total) by mouth daily at 6 PM.    . cetirizine (ZYRTEC) 10 MG tablet Take 10 mg by mouth daily as needed for allergies.     Marland Kitchen FLUoxetine (PROZAC) 20 MG tablet Take 2 tablets (40 mg total) by mouth daily. 180 tablet 1  . fluticasone (FLONASE) 50 MCG/ACT nasal spray Place 2 sprays into both nostrils daily. 16 g 6  . metoprolol succinate (TOPROL XL) 25 MG 24 hr tablet Take 0.5 tablets (12.5 mg total) by mouth daily. 30 tablet 6  . nitroGLYCERIN (NITROSTAT) 0.4 MG SL tablet PLACE 1 TABLET (0.4 MG TOTAL) UNDER THE TONGUE EVERY 5 (FIVE) MINUTES AS NEEDED FOR CHEST PAIN.  3  . Probiotic Product (ALIGN) 4 MG CAPS Take 1 capsule by mouth daily.    . ticagrelor (BRILINTA)  90 MG TABS tablet Take 1 tablet (90 mg total) by mouth 2 (two) times daily. 180 tablet 3  . traZODone (DESYREL) 50 MG tablet TAKE 1/2 TO 1 TABLET BY MOUTH AT BEDTIME AS NEEDED FOR SLEEP 30 tablet 0   No current facility-administered medications on file prior to visit.    Allergies  Allergen Reactions  . Elavil [Amitriptyline Hcl] Rash  . Sulfur Rash   Family History  Problem Relation Age of Onset  . Hypertension Mother   . Heart Problems Mother   . Hyperlipidemia Mother   . Hypotension Father   . Other Father        pacemaker  . Heart failure Paternal Grandmother   . Heart failure Paternal Grandfather   . Cancer Brother   . Cancer Sister   . Diabetes Sister    Social History   Socioeconomic History  . Marital status: Divorced    Spouse name: Not on file  . Number of children: Not on file  . Years of education: Not on file  . Highest education level: Bachelor's degree (e.g., BA, AB, BS)  Occupational History  . Not on file  Social Needs  . Financial resource strain: Not on file  . Food insecurity:    Worry: Not on file    Inability: Not on file  . Transportation needs:    Medical: Not on file    Non-medical: Not on file  Tobacco Use  . Smoking status: Never Smoker  . Smokeless tobacco: Never Used  Substance and Sexual Activity  . Alcohol use: Yes    Comment: 03/07/2018 "probably once/month"  . Drug use: No  . Sexual activity: Not on file  Lifestyle  . Physical activity:    Days per week: Not on file    Minutes per session: Not on file  . Stress: Not on file  Relationships  . Social connections:    Talks on phone: Not on file    Gets together: Not on file    Attends religious service: Not on file    Active member of club or organization: Not on file    Attends meetings of clubs or organizations: Not on file    Relationship status: Not on file  Other Topics Concern  . Not on file  Social History Narrative   Lives at home alone   Right handed   Caffeine:  1/2 cup of coffee daily that has (1/2 & 1/2 caf)   Depression screen [facility] 2/9 07/29/2018 05/30/2018 05/16/2018 05/10/2018 04/26/2018  Decreased Interest 0 1 1 2  0  Down, Depressed, Hopeless 0 1 1 2  0  PHQ - 2 Score 0 2 2 4  0  Altered sleeping - 3 2 1  -  Tired, decreased energy - 2 2 1  -  Change in appetite - 1 1 1  -  Feeling bad or failure about yourself  - 1 2 2  -  Trouble concentrating - 0 0 1 -  Moving slowly or fidgety/restless - 0 0 1 -  Suicidal thoughts - 0 0 0 -  PHQ-9 Score - 9 9 11  -  Difficult doing work/chores - Somewhat difficult Somewhat difficult Extremely dIfficult -    ROS As noted in HPI  Objective:  BP 103/62   Pulse 79   Temp 98.6 F (37 C) (Oral)   Resp 16   Ht 5\' 2"  (1.575 m)   Wt 143 lb 3.2 oz (65 kg)   LMP 09/10/2006   SpO2 98%   BMI 26.19 kg/m  Physical Exam Constitutional:      General: She is not in acute distress.    Appearance: She is well-developed. She is ill-appearing. She is not diaphoretic.  HENT:     Head: Normocephalic and atraumatic.     Right Ear: Ear canal and external ear normal. A middle ear effusion is present. Tympanic membrane is retracted.     Left Ear: Ear canal and external ear normal. A middle ear effusion is present. Tympanic membrane is retracted.     Nose: Mucosal edema and rhinorrhea present.     Right Sinus: Maxillary sinus tenderness present.     Left Sinus: Maxillary sinus tenderness present.     Mouth/Throat:     Pharynx: Uvula midline. Posterior oropharyngeal erythema present. No oropharyngeal exudate.     Tonsils: No tonsillar abscesses.  Eyes:     General: No scleral icterus.       Right eye: No discharge.        Left eye: No discharge.     Conjunctiva/sclera: Conjunctivae normal.  Neck:     Musculoskeletal: Normal range of motion and neck supple.  Cardiovascular:     Rate and Rhythm: Normal rate and regular rhythm.     Heart sounds: Normal heart sounds.  Pulmonary:     Effort: Pulmonary effort is normal.       Breath sounds: Normal breath sounds.  Lymphadenopathy:     Head:     Right side of head: Submandibular adenopathy present. No preauricular or posterior auricular adenopathy.     Left side of head: Submandibular adenopathy present. No preauricular or posterior auricular adenopathy.     Cervical: No cervical adenopathy.     Upper Body:     Right upper body: No supraclavicular adenopathy.     Left upper body: No supraclavicular adenopathy.  Skin:    General: Skin is warm and dry.     Findings: No erythema.  Neurological:     Mental Status: She is oriented to person, place, and time. She is lethargic.  Psychiatric:        Behavior: Behavior normal.        Assessment & Plan:   1. Allergic sinusitis    Patient will continue on current chronic medications other than changes noted above, so ok to refill when needed.   See after visit summary for patient specific instructions.  Meds ordered this encounter  Medications  . methylPREDNISolone acetate (DEPO-MEDROL) injection 80 mg  . azithromycin (ZITHROMAX) 250 MG tablet    Sig: Take 2 tabs PO x 1 dose, then 1 tab PO QD x 4  days    Dispense:  6 tablet    Refill:  0  . chlorpheniramine-HYDROcodone (TUSSIONEX PENNKINETIC ER) 10-8 MG/5ML SUER    Sig: Take 5 mLs by mouth every 12 (twelve) hours as needed.    Dispense:  120 mL    Refill:  0  . benzonatate (TESSALON) 200 MG capsule    Sig: Take 1 capsule (200 mg total) by mouth 3 (three) times daily as needed for cough.    Dispense:  40 capsule    Refill:  0    Patient verbalized to me that they understand the following: diagnosis, what is being done for them, what to expect and what should be done at home.  Their questions have been answered. They understand that I am unable to predict every possible medication interaction or adverse outcome and that if any unexpected symptoms arise, they should contact us and their pharmacist, as well as never hesitate to seek urgent/emergent care  at [facility] or ER if they think it might be warranted.    Delman Cheadle, MD, MPH [facility] [address] [place], [place]  [id number] [phone] Office phone  [phone] Office fax  07/29/18 11:57 AM

## 2018-07-29 NOTE — Patient Instructions (Addendum)
Use the flonase every night before bed for the next 2 weeks. You may want to add in the cetirizine (Zyrtec before bed if your symptoms are not significantly improved/resolving in 3-4d.) If no improvement by tomorrow or at all worsening, go ahead and fill the printed antibiotic medicine.  Use the benzonatate pearls for cough during the day and a teaspoon of the tussionex cough syrup at night - remember to take it 12 hrs before you need to get up though as it is extended release and can make you drowsy/loopy.   If you have lab work done today you will be contacted with your lab results within the next 2 weeks.  If you have not heard from [place] then please contact us. The fastest way to get your results is to register for My Chart.   IF you received an x-ray today, you will receive an invoice from [facility]. Please contact [facility] at [phone] with questions or concerns regarding your invoice.   IF you received labwork today, you will receive an invoice from [facility]. Please contact [facility] at [phone] with questions or concerns regarding your invoice.   Our billing staff will not be able to assist you with questions regarding bills from these companies.  You will be contacted with the lab results as soon as they are available. The fastest way to get your results is to activate your My Chart account. Instructions are located on the last page of this paperwork. If you have not heard from [place] regarding the results in 2 weeks, please contact this office.    Sinus Rinse What is a sinus rinse? A sinus rinse is a simple home treatment that is used to rinse your sinuses with a sterile mixture of salt and water (saline solution). Sinuses are air-filled spaces in your skull behind the bones of your face and forehead that open into your nasal cavity. You will use the following:  Saline solution.  Neti pot or spray bottle. This releases the saline solution into your nose and  through your sinuses. Neti pots and spray bottles can be purchased at [employer], a health food store, or online.  When would I do a sinus rinse? A sinus rinse can help to clear mucus, dirt, dust, or pollen from the nasal cavity. You may do a sinus rinse when you have a cold, a virus, nasal allergy symptoms, a sinus infection, or stuffiness in the nose or sinuses. If you are considering a sinus rinse:  Ask your child's health care provider before performing a sinus rinse on your child.  Do not do a sinus rinse if you have had ear or nasal surgery, ear infection, or blocked ears.  How do I do a sinus rinse?  Wash your hands.  Disinfect your device according to the directions provided and then dry it.  Use the solution that comes with your device or one that is sold separately in stores. Follow the mixing directions on the package.  Fill your device with the amount of saline solution as directed by the device instructions.  Stand over a sink and tilt your head sideways over the sink.  Place the spout of the device in your upper nostril (the one closer to the ceiling).  Gently pour or squeeze the saline solution into the nasal cavity. The liquid should drain to the lower nostril if you are not overly congested.  Gently blow your nose. Blowing too hard may cause ear pain.  Repeat in the  other nostril.  Clean and rinse your device with clean water and then air-dry it. Are there risks of a sinus rinse? Sinus rinse is generally very safe and effective. However, there are a few risks, which include:  A burning sensation in the sinuses. This may happen if you do not make the saline solution as directed. Make sure to follow all directions when making the saline solution.  Infection from contaminated water. This is rare, but possible.  Nasal irritation.  This information is not intended to replace advice given to you by your health care provider. Make sure you discuss any  questions you have with your health care provider. Document Released: 02/27/2014 Document Revised: 06/29/2016 Document Reviewed: 12/18/2013 Elsevier Interactive Patient Education  2017 [employer].

## 2018-07-31 ENCOUNTER — Ambulatory Visit (HOSPITAL_COMMUNITY): Payer: PRIVATE HEALTH INSURANCE

## 2018-07-31 ENCOUNTER — Telehealth (HOSPITAL_COMMUNITY): Payer: Self-pay | Admitting: Family Medicine

## 2018-07-31 ENCOUNTER — Encounter (HOSPITAL_COMMUNITY): Payer: PRIVATE HEALTH INSURANCE

## 2018-08-02 ENCOUNTER — Encounter (HOSPITAL_COMMUNITY): Payer: PRIVATE HEALTH INSURANCE

## 2018-08-02 ENCOUNTER — Ambulatory Visit (HOSPITAL_COMMUNITY): Payer: PRIVATE HEALTH INSURANCE

## 2018-08-04 ENCOUNTER — Encounter (HOSPITAL_COMMUNITY)
Admission: RE | Admit: 2018-08-04 | Discharge: 2018-08-04 | Disposition: A | Discharge status: Home / Self Care | Payer: PRIVATE HEALTH INSURANCE | Source: Ambulatory Visit | Attending: Cardiovascular Disease | Admitting: Cardiovascular Disease

## 2018-08-04 DIAGNOSIS — I2510 Atherosclerotic heart disease of native coronary artery without angina pectoris: Secondary | ICD-10-CM | POA: Diagnosis not present

## 2018-08-04 DIAGNOSIS — Z955 Presence of coronary angioplasty implant and graft: Secondary | ICD-10-CM

## 2018-08-07 ENCOUNTER — Telehealth: Payer: Self-pay | Admitting: *Deleted

## 2018-08-07 ENCOUNTER — Encounter (HOSPITAL_COMMUNITY)
Admission: RE | Admit: 2018-08-07 | Discharge: 2018-08-07 | Disposition: A | Discharge status: Home / Self Care | Payer: PRIVATE HEALTH INSURANCE | Source: Ambulatory Visit | Attending: Cardiovascular Disease | Admitting: Cardiovascular Disease

## 2018-08-07 DIAGNOSIS — I2510 Atherosclerotic heart disease of native coronary artery without angina pectoris: Secondary | ICD-10-CM | POA: Diagnosis not present

## 2018-08-07 DIAGNOSIS — Z955 Presence of coronary angioplasty implant and graft: Secondary | ICD-10-CM

## 2018-08-07 NOTE — Telephone Encounter (Signed)
Received request from pharmacy to change Brilinta to Plavix due to cost/drug coverage.    Per Dr. Dorris Singh to change to Plavix 75 mg but would need to check P2Y12 in 1-2 weeks after starting.     Attempt to call patient, no answer and unable to leave VM (VM full).

## 2018-08-10 ENCOUNTER — Other Ambulatory Visit: Payer: Self-pay | Admitting: Family Medicine

## 2018-08-10 DIAGNOSIS — R062 Wheezing: Secondary | ICD-10-CM

## 2018-08-10 DIAGNOSIS — Z9109 Other allergy status, other than to drugs and biological substances: Secondary | ICD-10-CM

## 2018-08-10 NOTE — Telephone Encounter (Signed)
Requested medication (s) are due for refill today: Yes  Requested medication (s) are on the active medication list: Yes  Last refill:  2017 for both  Future visit scheduled: No  Notes to clinic:  Unable to refill, expired Rx. Patient would like a refilled before 08/15/18 so that she can get them free this year.      Requested Prescriptions  Pending Prescriptions Disp Refills   fluticasone (FLONASE) 50 MCG/ACT nasal spray 16 g 6    Sig: Place 2 sprays into both nostrils daily.     Ear, Nose, and Throat: Nasal Preparations - Corticosteroids Passed - 08/10/2018 11:43 AM      Passed - Valid encounter within last 12 months    Recent Outpatient Visits          1 week ago Allergic sinusitis   [facility], Laurey Arrow, MD   2 months ago Adjustment disorder with mixed anxiety and depressed mood   [facility], Ranell Patrick, MD   2 months ago Anxiety state   [facility], Ranell Patrick, MD   3 months ago Anxiety state   [facility], Ranell Patrick, MD   3 months ago Lower abdominal pain   [facility], Ranell Patrick, MD            montelukast (SINGULAIR) 10 MG tablet 30 tablet 3    Sig: Take 1 tablet (10 mg total) by mouth at bedtime.     Pulmonology:  Leukotriene Inhibitors Passed - 08/10/2018 11:43 AM      Passed - Valid encounter within last 12 months    Recent Outpatient Visits          1 week ago Allergic sinusitis   [facility], Laurey Arrow, MD   2 months ago Adjustment disorder with mixed anxiety and depressed mood   [facility], Ranell Patrick, MD   2 months ago Anxiety state   [facility], Ranell Patrick, MD   3 months ago Anxiety state   [facility], Ranell Patrick, MD   3 months ago Lower abdominal pain   [facility], Ranell Patrick, MD

## 2018-08-10 NOTE — Telephone Encounter (Signed)
Copied from [place] [id number]. Topic: Quick Communication - See Telephone Encounter >> Aug 10, 2018 10:43 AM Oneta Rack wrote: Relation to pt: self  Call back number: [phone] Pharmacy: [id number] - [place], [place] (Phone) [phone] (Fax)  Reason for call:  Patient requesting fluticasone (FLONASE) 50 MCG/ACT nasal spray and singular (chart doesn't reflect) patient contacted pharmacy, patient checking if request was received. Informed patient please allow 48 to 72 hour turn around time, patient states her medication will be free prior to the new year, please advise

## 2018-08-11 ENCOUNTER — Encounter (HOSPITAL_COMMUNITY)
Admission: RE | Admit: 2018-08-11 | Discharge: 2018-08-11 | Disposition: A | Discharge status: Home / Self Care | Payer: PRIVATE HEALTH INSURANCE | Source: Ambulatory Visit | Attending: Cardiovascular Disease | Admitting: Cardiovascular Disease

## 2018-08-11 DIAGNOSIS — Z955 Presence of coronary angioplasty implant and graft: Secondary | ICD-10-CM

## 2018-08-11 DIAGNOSIS — I2510 Atherosclerotic heart disease of native coronary artery without angina pectoris: Secondary | ICD-10-CM | POA: Diagnosis not present

## 2018-08-11 MED ORDER — FLUTICASONE PROPIONATE 50 MCG/ACT NA SUSP: 2.0000 | Freq: Every day | NASAL | 6 refills | Status: DC

## 2018-08-11 MED ORDER — MONTELUKAST SODIUM 10 MG PO TABS: 10.0000 mg | ORAL_TABLET | Freq: Every day | ORAL | 3 refills | Status: DC

## 2018-08-14 ENCOUNTER — Encounter (HOSPITAL_COMMUNITY)
Admission: RE | Admit: 2018-08-14 | Discharge: 2018-08-14 | Disposition: A | Discharge status: Home / Self Care | Payer: PRIVATE HEALTH INSURANCE | Source: Ambulatory Visit | Attending: Cardiovascular Disease | Admitting: Cardiovascular Disease

## 2018-08-14 ENCOUNTER — Encounter (HOSPITAL_COMMUNITY): Payer: Self-pay

## 2018-08-14 DIAGNOSIS — Z955 Presence of coronary angioplasty implant and graft: Secondary | ICD-10-CM

## 2018-08-14 DIAGNOSIS — I2510 Atherosclerotic heart disease of native coronary artery without angina pectoris: Secondary | ICD-10-CM | POA: Diagnosis not present

## 2018-08-17 NOTE — Telephone Encounter (Signed)
Called mobile #-left message with husband to call back

## 2018-08-18 ENCOUNTER — Telehealth: Payer: Self-pay | Admitting: Cardiovascular Disease

## 2018-08-18 NOTE — Telephone Encounter (Signed)
Spoke with pt. Pt sts that she had stents placed in July 2019. She is scheduled to have dental cleaning today and she was asked if she needs to have antiobiotis prior to her appt. Adv pt the SBE prophylaxis is not indicated for pt with hx of pci. Pt denies hx of heart valve replacement, hx of IE, congenital heart disease. Pt verbalized understanding and voiced appreciation for the assistance.

## 2018-08-22 NOTE — Progress Notes (Signed)
Discharge Progress Report  Patient Details  Name: [name] MRN: [id number] Date of Birth: 05/04/1951 Referring Provider:   Flowsheet Row CARDIAC REHAB PHASE II ORIENTATION from 05/04/2018 in [place]  Referring Provider  Troy Sine MD        Number of Visits: 35   Reason for Discharge:  Patient has met program and personal goals.  Smoking History:  Social History   Tobacco Use  Smoking Status Never Smoker  Smokeless Tobacco Never Used    Diagnosis:  07/23/2019Stented coronary artery  ADL UCSD:   Initial Exercise Prescription: Initial Exercise Prescription - 05/04/18 1100    Date of Initial Exercise RX and Referring Provider          Date  05/04/18    Referring Provider  Troy Sine MD     Expected Discharge Date  08/02/18        Recumbant Bike          Level  1.5    Watts  25    Minutes  10    METs  3.21        NuStep          Level  2    SPM  75    Minutes  10    METs  3        Track          Laps  12    Minutes  10    METs  3.07        Prescription Details          Frequency (times per week)  3x    Duration  Progress to 30 minutes of continuous aerobic without signs/symptoms of physical distress        Intensity          THRR 40-80% of Max Heartrate  62-123    Ratings of Perceived Exertion  11-13    Perceived Dyspnea  0-4        Progression          Progression  Continue progressive overload as per policy without signs/symptoms or physical distress.        Resistance Training          Training Prescription  Yes    Weight  3lbs    Reps  10-15           Discharge Exercise Prescription (Final Exercise Prescription Changes): Exercise Prescription Changes - 08/14/18 1500    Response to Exercise          Blood Pressure (Admit)  110/60    Blood Pressure (Exercise)  144/60    Blood Pressure (Exit)  112/60    Heart Rate (Admit)  62 bpm    Heart Rate (Exercise)  116 bpm    Heart  Rate (Exit)  71 bpm    Rating of Perceived Exertion (Exercise)  12    Symptoms  none    Duration  Progress to 30 minutes of  aerobic without signs/symptoms of physical distress    Intensity  THRR unchanged        Progression          Progression  Continue to progress workloads to maintain intensity without signs/symptoms of physical distress.    Average METs  4.1        Resistance Training          Training Prescription  Yes    Weight  3  lbs.     Reps  10-15    Time  10 Minutes        Interval Training          Interval Training  No        Recumbant Bike          Level  1.5    Watts  30    Minutes  10    METs  3.5        NuStep          Level  4    SPM  85    Minutes  10    METs  5.3        Track          Laps  16    Minutes  10    METs  3.44        Home Exercise Plan          Plans to continue exercise at  Home (comment)    Frequency  Add 3 additional days to program exercise sessions.    Initial Home Exercises Provided  05/19/18           Functional Capacity: 6 Minute Walk    6 Minute Walk    Row Name 05/04/18 1101 07/10/18 1457   Phase  Initial  Discharge   Distance  1600 feet  1980 feet   Distance % Change  no documentation  23.75 %   Distance Feet Change  no documentation  380 ft   Walk Time  6 minutes  6 minutes   # of Rest Breaks  0  0   MPH  3.03  3.75   METS  3.39  4.19   RPE  12  12   Perceived Dyspnea   0  no documentation   VO2 Peak  11.87  14.67   Symptoms  No  No   Resting HR  67 bpm  56 bpm   Resting BP  108/60  120/70   Resting Oxygen Saturation   98 %  no documentation   Exercise Oxygen Saturation  during 6 min walk  98 %  no documentation   Max Ex. HR  90 bpm  95 bpm   Max Ex. BP  118/60  130/64   2 Minute Post BP  98/60  104/64          Psychological, QOL, Others - Outcomes: PHQ 2/9: Depression screen [facility] 2/9 07/29/2018 05/30/2018 05/16/2018 05/10/2018 04/26/2018  Decreased Interest 0 1 1 2  0  Down, Depressed,  Hopeless 0 1 1 2  0  PHQ - 2 Score 0 2 2 4  0  Altered sleeping - 3 2 1  -  Tired, decreased energy - 2 2 1  -  Change in appetite - 1 1 1  -  Feeling bad or failure about yourself  - 1 2 2  -  Trouble concentrating - 0 0 1 -  Moving slowly or fidgety/restless - 0 0 1 -  Suicidal thoughts - 0 0 0 -  PHQ-9 Score - 9 9 11  -  Difficult doing work/chores - Somewhat difficult Somewhat difficult Extremely dIfficult -    Quality of Life: Quality of Life - 08/07/18 1523    Quality of Life          Select  Quality of Life        Quality of Life Scores          Health/Function Post  20.11 %  Socioeconomic Post  18.21 %    Psych/Spiritual Post  17 %    Family Post  17.83 %    GLOBAL Post  18.76 %           Personal Goals: Goals established at orientation with interventions provided to work toward goal. Personal Goals and Risk Factors at Admission - 05/04/18 1059    Core Components/Risk Factors/Patient Goals on Admission           Weight Management  Yes;Weight Loss    Intervention  Weight Management: Develop a combined nutrition and exercise program designed to reach desired caloric intake, while maintaining appropriate intake of nutrient and fiber, sodium and fats, and appropriate energy expenditure required for the weight goal.;Weight Management: Provide education and appropriate resources to help participant work on and attain dietary goals.;Weight Management/Obesity: Establish reasonable short term and long term weight goals.;Obesity: Provide education and appropriate resources to help participant work on and attain dietary goals.    Admit Weight  146 lb 13.2 oz (66.6 kg)    Goal Weight: Short Term  140 lb (63.5 kg)    Goal Weight: Long Term  130 lb (59 kg)    Expected Outcomes  Short Term: Continue to assess and modify interventions until short term weight is achieved;Long Term: Adherence to nutrition and physical activity/exercise program aimed toward attainment of established weight  goal;Weight Loss: Understanding of general recommendations for a balanced deficit meal plan, which promotes 1-2 lb weight loss per week and includes a negative energy balance of [phone] kcal/d;Understanding recommendations for meals to include 15-35% energy as protein, 25-35% energy from fat, 35-60% energy from carbohydrates, less than 254m of dietary cholesterol, 20-35 gm of total fiber daily;Understanding of distribution of calorie intake throughout the day with the consumption of 4-5 meals/snacks    Hypertension  Yes    Intervention  Monitor prescription use compliance.;Provide education on lifestyle modifcations including regular physical activity/exercise, weight management, moderate sodium restriction and increased consumption of fresh fruit, vegetables, and low fat dairy, alcohol moderation, and smoking cessation.    Expected Outcomes  Short Term: Continued assessment and intervention until BP is < 140/931mHG in hypertensive participants. < 130/8021mG in hypertensive participants with diabetes, heart failure or chronic kidney disease.;Long Term: Maintenance of blood pressure at goal levels.    Lipids  Yes    Intervention  Provide education and support for participant on nutrition & aerobic/resistive exercise along with prescribed medications to achieve LDL <98m26mDL >40mg63m Expected Outcomes  Short Term: Participant states understanding of desired cholesterol values and is compliant with medications prescribed. Participant is following exercise prescription and nutrition guidelines.;Long Term: Cholesterol controlled with medications as prescribed, with individualized exercise RX and with personalized nutrition plan. Value goals: LDL < 98mg,79m > 40 mg.    Stress  Yes    Intervention  Offer individual and/or small group education and counseling on adjustment to heart disease, stress management and health-related lifestyle change. Teach and support self-help strategies.;Refer participants  experiencing significant psychosocial distress to appropriate mental health specialists for further evaluation and treatment. When possible, include family members and significant others in education/counseling sessions.    Expected Outcomes  Short Term: Participant demonstrates changes in health-related behavior, relaxation and other stress management skills, ability to obtain effective social support, and compliance with psychotropic medications if prescribed.;Long Term: Emotional wellbeing is indicated by absence of clinically significant psychosocial distress or social isolation.  Personal Goals Discharge: Goals and Risk Factor Review    Core Components/Risk Factors/Patient Goals Review    Row Name 05/10/18 1618 05/18/18 0824 06/15/18 1424 07/06/18 1228 07/21/18 1540   Personal Goals Review  Weight Management/Obesity;Hypertension;Lipids;Stress  Weight Management/Obesity;Hypertension;Lipids;Stress  Weight Management/Obesity;Hypertension;Lipids;Stress  Weight Management/Obesity;Hypertension;Lipids;Stress  Weight Management/Obesity;Hypertension;Lipids;Stress   Review  pt with multiple CAD RF demonstrates eagerness to participate in CR program.  pt personal goals are to improve dietary habits, lose weight and have an enthusiastic outlook on life.   pt with multiple CAD RF demonstrates eagerness to participate in CR program.  pt personal goals are to improve dietary habits, lose weight and have an enthusiastic outlook on life.   pt with multiple CAD RF demonstrates eagerness to participate in CR program.  pt personal goals are to improve dietary habits, lose weight and have an enthusiastic outlook on life. 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 pt projects improved self confidence.   pt with multiple CAD RF demonstrates eagerness to participate in CR program.  pt personal goals are to improve dietary habits, lose weight and have an enthusiastic outlook on life. pt is  walking at home and has returned to work without physical  difficulty.    pt with multiple CAD RF demonstrates eagerness to participate in CR program.  pt personal goals are to improve dietary habits, lose weight and have an enthusiastic outlook on life. pt is walking at home and has returned to work without physical  difficulty.     Expected Outcomes  pt will participate in CR exercise, nutrition and lifestyle modification to decrease overall RF and improve quality of life.   pt will participate in CR exercise, nutrition and lifestyle modification to decrease overall RF and improve quality of life.   pt will participate in CR exercise, nutrition and lifestyle modification to decrease overall RF and improve quality of life.   pt will participate in CR exercise, nutrition and lifestyle modification to decrease overall RF and improve quality of life.   pt will participate in CR exercise, nutrition and lifestyle modification to decrease overall RF and improve quality of life.        Core Components/Risk Factors/Patient Goals Review    Row Name 08/14/18 1607   Personal Goals Review  Weight Management/Obesity;Hypertension;Lipids;Stress   Review  pt with multiple CAD RF demonstrates eagerness to participate in CR program.  pt personal goals are to improve dietary habits, lose weight and have an enthusiastic outlook on life. pt is walking at home and has returned to work without physical  difficulty.  pt is most proud of her 9lb weight loss. pt encouraged to continue her exercise,  nutrition and weight loss efforts.    Expected Outcomes  pt will participate in  exercise, nutrition and lifestyle modification to decrease overall RF in the community and maintain improved quality of life.           Exercise Goals and Review: Exercise Goals    Exercise Goals    Row Name 05/04/18 1103   Increase Physical Activity  Yes   Intervention  Provide advice, education, support and counseling about physical  activity/exercise needs.;Develop an individualized exercise prescription for aerobic and resistive training based on initial evaluation findings, risk stratification, comorbidities and participant's personal goals.   Expected Outcomes  Short Term: Attend rehab on a regular basis to increase amount of physical activity.;Long Term: Exercising regularly at least 3-5 days a week.;Long Term: Add in home exercise  to make exercise part of routine and to increase amount of physical activity.   Increase Strength and Stamina  Yes   Intervention  Provide advice, education, support and counseling about physical activity/exercise needs.;Develop an individualized exercise prescription for aerobic and resistive training based on initial evaluation findings, risk stratification, comorbidities and participant's personal goals.   Expected Outcomes  Short Term: Increase workloads from initial exercise prescription for resistance, speed, and METs.;Short Term: Perform resistance training exercises routinely during rehab and add in resistance training at home;Long Term: Improve cardiorespiratory fitness, muscular endurance and strength as measured by increased METs and functional capacity (6MWT)   Able to understand and use rate of perceived exertion (RPE) scale  Yes   Intervention  Provide education and explanation on how to use RPE scale   Expected Outcomes  Short Term: Able to use RPE daily in rehab to express subjective intensity level;Long Term:  Able to use RPE to guide intensity level when exercising independently   Knowledge and understanding of Target Heart Rate Range (THRR)  Yes   Intervention  Provide education and explanation of THRR including how the numbers were predicted and where they are located for reference   Expected Outcomes  Short Term: Able to state/look up THRR;Long Term: Able to use THRR to govern intensity when exercising independently;Short Term: Able to use daily as guideline for intensity in rehab    Able to check pulse independently  Yes   Intervention  Provide education and demonstration on how to check pulse in carotid and radial arteries.;Review the importance of being able to check your own pulse for safety during independent exercise   Expected Outcomes  Short Term: Able to explain why pulse checking is important during independent exercise;Long Term: Able to check pulse independently and accurately   Understanding of Exercise Prescription  Yes   Intervention  Provide education, explanation, and written materials on patient's individual exercise prescription   Expected Outcomes  Short Term: Able to explain program exercise prescription;Long Term: Able to explain home exercise prescription to exercise independently          Exercise Goals Re-Evaluation: Exercise Goals Re-Evaluation    Exercise Goal Re-Evaluation    Row Name 05/15/18 1358 05/19/18 1350 05/31/18 1620 06/14/18 1550 07/05/18 1459   Exercise Goals Review  Able to understand and use rate of perceived exertion (RPE) scale;Increase Physical Activity  Able to understand and use rate of perceived exertion (RPE) scale;Increase Physical Activity;Understanding of Exercise Prescription;Knowledge and understanding of Target Heart Rate Range (THRR)  Increase Physical Activity;Increase Strength and Stamina;Able to check pulse independently;Knowledge and understanding of Target Heart Rate Range (THRR);Able to understand and use rate of perceived exertion (RPE) scale;Understanding of Exercise Prescription  Increase Physical Activity;Increase Strength and Stamina;Able to check pulse independently;Knowledge and understanding of Target Heart Rate Range (THRR);Able to understand and use rate of perceived exertion (RPE) scale;Understanding of Exercise Prescription  Increase Physical Activity;Increase Strength and Stamina;Able to check pulse independently;Understanding of Exercise Prescription;Knowledge and understanding of Target Heart Rate Range  (THRR);Able to understand and use rate of perceived exertion (RPE) scale   Comments  Patient is walking 1 day in addition to exercise at cardiac rehab. Pt understands and is able to use RPE scale appropriately.  Reviewed home exercise guidelines with patient including THRR, RPE scale, endpoints for exercise. Pt is walking as her mode of home exercise.  Reviewed METs and goals with Pt. Will continue to walk 30 minutes per day in addition to CR exercise.  Pt is tolerating exercise well. Continues to increase MET level. Continues to exercise at home in addition to Cardiac Rehab.   Reviewed METs and goals with Pt. Pt MET level is 3.7 and continues to increase. Pt continues to wlak for 30 minutes 2 days per week in addition to cardiac rehab.    Expected Outcomes  Increase workloads as tolerated to help increase energy and stamina.  Pt will increase walking to 2-3 days/week at home in addition to exercise at cardiac rehab.  Will continue to monitor and progress Pt as tolerated.   Will continue to monitor and progress Pt as tolerated.   Will continue to monitor and progress Pt as tolerated.        Exercise Goal Re-Evaluation    Row Name 07/26/18 1033 08/14/18 1509   Exercise Goals Review  Increase Physical Activity;Increase Strength and Stamina;Able to check pulse independently;Understanding of Exercise Prescription;Knowledge and understanding of Target Heart Rate Range (THRR);Able to understand and use rate of perceived exertion (RPE) scale  Increase Physical Activity;Increase Strength and Stamina;Able to check pulse independently;Understanding of Exercise Prescription;Knowledge and understanding of Target Heart Rate Range (THRR);Able to understand and use rate of perceived exertion (RPE) scale   Comments  Reviewed METs and goals with Pt. Pt MET level is 4.0 and continues to increase. Pt continues to wlak for 30 minutes 2 days per week in addition to cardiac rehab.   Pt graduated cardiac rehab today. Achieved a  MET level of 4.1 and progressed through the program well. Pt will continue exercising at local gym 5-7 days a week for 30-45 minutes.    Expected Outcomes  Will continue to monitor and progress Pt as tolerated.   Will continue exericse Rx at local gym.           Nutrition & Weight - Outcomes: Pre Biometrics - 05/04/18 1102    Pre Biometrics          Height  5' 2"  (1.575 m)    Weight  66.6 kg    Waist Circumference  35 inches    Hip Circumference  40 inches    Waist to Hip Ratio  0.88 %    BMI (Calculated)  26.85    Triceps Skinfold  28 mm    % Body Fat  38.7 %    Grip Strength  30 kg    Flexibility  15 in    Single Leg Stand  30 seconds          Post Biometrics - 07/10/18 1458     Post  Biometrics          Height  5' 2"  (1.575 m)    Weight  66.1 kg    Waist Circumference  34 inches    Hip Circumference  40 inches    Waist to Hip Ratio  0.85 %    BMI (Calculated)  26.65    Triceps Skinfold  30 mm    % Body Fat  38.7 %    Grip Strength  26 kg    Flexibility  15 in    Single Leg Stand  18.56 seconds           Nutrition: Nutrition Therapy & Goals - 08/18/18 1016    Nutrition Therapy          Diet  heart healthy, carb modified        Personal Nutrition Goals          Nutrition Goal  pt to identify  and limit food sources of saturated fat, trans fat, sodium, refined carbohydrates        Intervention Plan          Intervention  Prescribe, educate and counsel regarding individualized specific dietary modifications aiming towards targeted core components such as weight, hypertension, lipid management, diabetes, heart failure and other comorbidities.    Expected Outcomes  Short Term Goal: Understand basic principles of dietary content, such as calories, fat, sodium, cholesterol and nutrients.;Long Term Goal: Adherence to prescribed nutrition plan.           Nutrition Discharge: Nutrition Assessments - 08/18/18 1017    MEDFICTS Scores          Pre Score  27     Post Score  9    Score Difference  -18           Education Questionnaire Score: Knowledge Questionnaire Score - 08/07/18 1348    Knowledge Questionnaire Score          Post Score  24/24           Goals reviewed with patient; copy given to patient.

## 2018-08-22 NOTE — Telephone Encounter (Signed)
Attempted to contact patient to let her know she needs blood work.  Left call back number.

## 2018-08-28 NOTE — Telephone Encounter (Signed)
Patient called back, states she was not aware of changing any medications, and will call her pharmacy to discuss.  She request that I speak with Dr.Kelly on the differences in these two medications . Will call back to discuss what pharmacy advised.

## 2018-08-30 ENCOUNTER — Other Ambulatory Visit: Payer: Self-pay | Admitting: Family Medicine

## 2018-08-30 DIAGNOSIS — G4700 Insomnia, unspecified: Secondary | ICD-10-CM

## 2018-09-14 IMAGING — DX DG ABDOMEN 1V
1 series · 1 of 1 positions shown · non-contrast
Comparison: 09/27/2017

CLINICAL DATA: Flank pain

EXAM:
ABDOMEN - 1 VIEW

[abdomen kub]
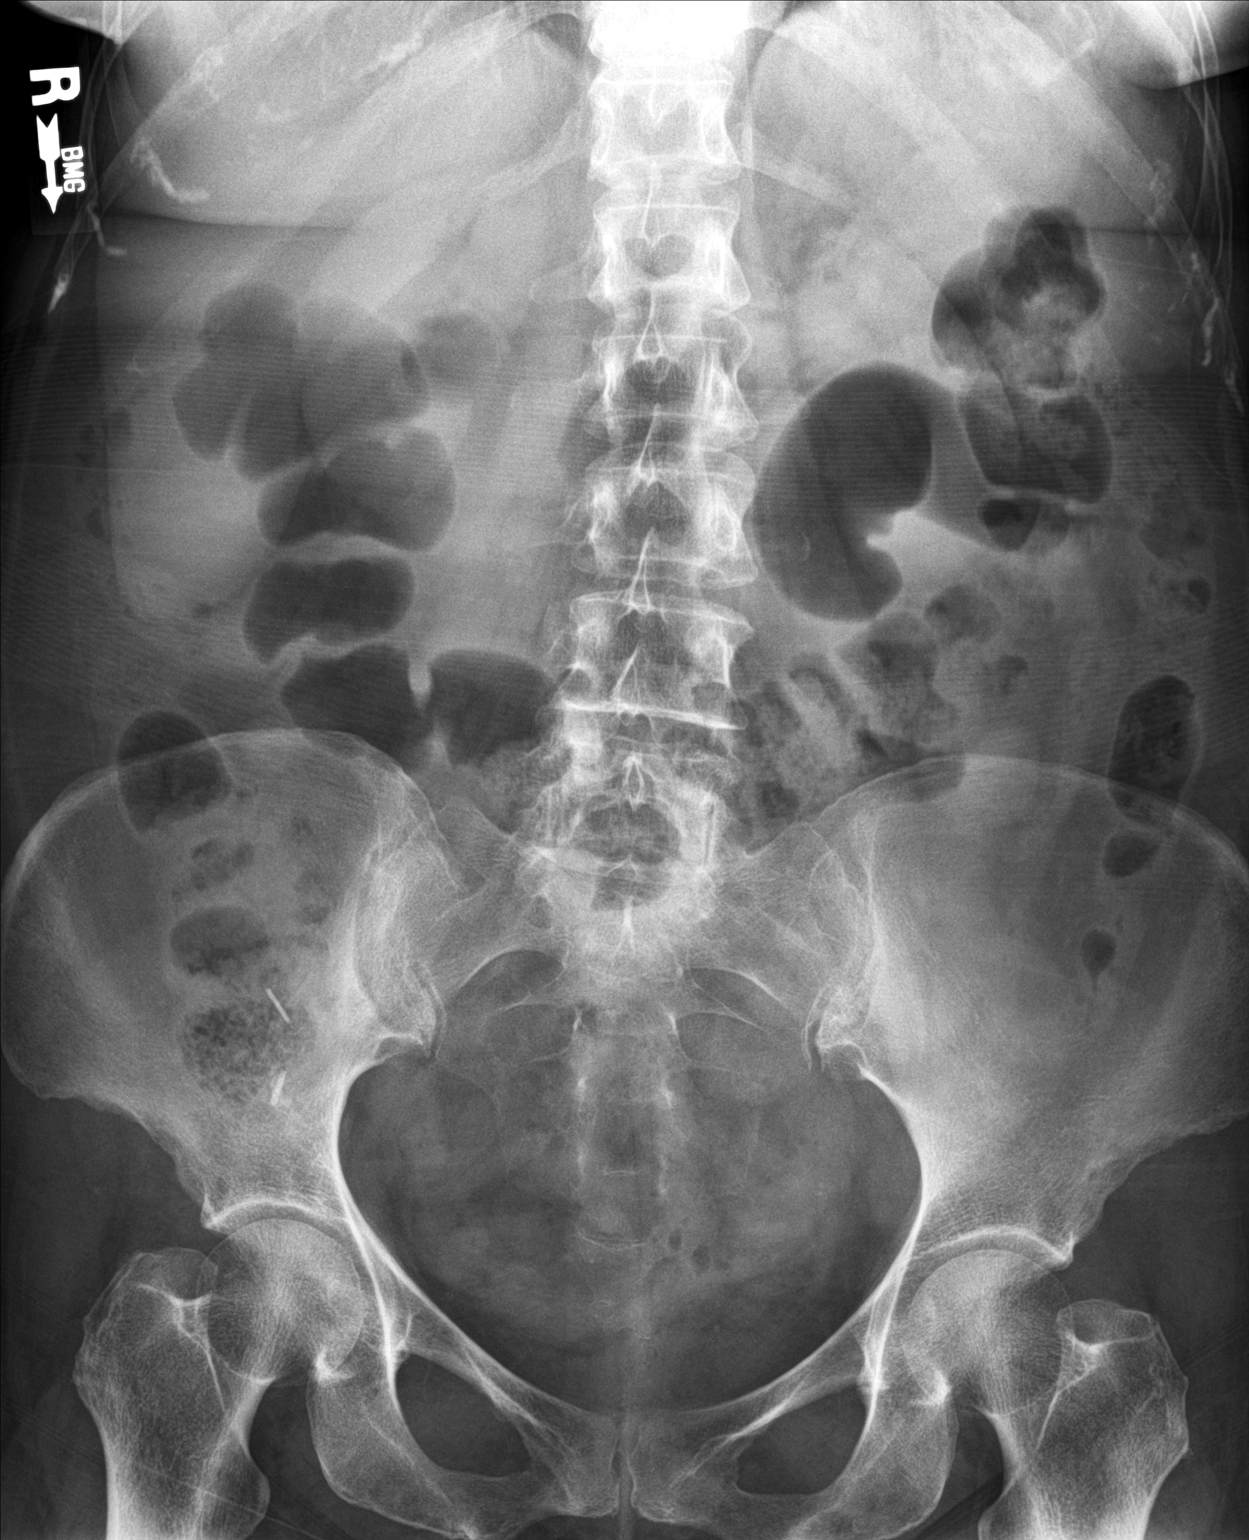

[1 of 1 positions shown; findings below may reference images not displayed]

FINDINGS: The bowel gas pattern is normal. No radio-opaque calculi or other
significant radiographic abnormality are seen.
IMPRESSION: No radiopaque nephroureterolithiasis.

## 2018-09-25 ENCOUNTER — Telehealth: Payer: Self-pay | Admitting: Cardiovascular Disease

## 2018-09-25 NOTE — Telephone Encounter (Signed)
New message   Patient would like to have blood work ordered for lipid panel and to A1C.  Please advise.

## 2018-09-25 NOTE — Telephone Encounter (Signed)
This encounter was created in error - please disregard.

## 2018-09-25 NOTE — Telephone Encounter (Signed)
Last Hgb A1C was done 07/06/18, too early to repeat.  Left message to call back to discuss

## 2018-09-26 ENCOUNTER — Other Ambulatory Visit: Payer: Self-pay | Admitting: Family Medicine

## 2018-09-26 ENCOUNTER — Telehealth: Payer: Self-pay | Admitting: Cardiovascular Disease

## 2018-09-26 DIAGNOSIS — G4700 Insomnia, unspecified: Secondary | ICD-10-CM

## 2018-09-26 LAB — LIPID PANEL
CHOLESTEROL TOTAL: 114 mg/dL (ref 100–199)
Chol/HDL Ratio: 2.4 ratio (ref 0.0–4.4)
HDL: 47 mg/dL (ref >39)
LDL Calculated: 49 mg/dL (ref 0–99)
Triglycerides: 88 mg/dL (ref 0–149)
VLDL CHOLESTEROL CAL: 18 mg/dL (ref 5–40)

## 2018-09-26 LAB — HEPATIC FUNCTION PANEL
ALK PHOS: 159 IU/L — AB (ref 39–117)
ALT: 29 IU/L (ref 0–32)
AST: 26 IU/L (ref 0–40)
Albumin: 4.6 g/dL (ref 3.8–4.8)
BILIRUBIN TOTAL: 1 mg/dL (ref 0.0–1.2)
BILIRUBIN, DIRECT: 0.24 mg/dL (ref 0.00–0.40)
Total Protein: 6.8 g/dL (ref 6.0–8.5)

## 2018-09-26 NOTE — Telephone Encounter (Signed)
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 They are requested a change from [place]. [name] will refax form. Will forward to Almyra Free so she can be looking for fax

## 2018-09-26 NOTE — Telephone Encounter (Signed)
Requested Prescriptions  Pending Prescriptions Disp Refills  . traZODone (DESYREL) 50 MG tablet [Pharmacy Med Name: TRAZODONE 50 MG TABLET] 90 tablet 1    Sig: TAKE 1/2 TO 1 TABLET BY MOUTH AT BEDTIME AS NEEDED FOR SLEEP     Psychiatry: Antidepressants - Serotonin Modulator Passed - 09/26/2018  1:55 AM      Passed - Completed PHQ-2 or PHQ-9 in the last 360 days.      Passed - Valid encounter within last 6 months    Recent Outpatient Visits          1 month ago Allergic sinusitis   [facility], Laurey Arrow, MD   3 months ago Adjustment disorder with mixed anxiety and depressed mood   [facility], Ranell Patrick, MD   4 months ago Anxiety state   [facility], Ranell Patrick, MD   5 months ago Anxiety state   [facility], Ranell Patrick, MD   5 months ago Lower abdominal pain   [facility], Ranell Patrick, MD

## 2018-09-26 NOTE — Telephone Encounter (Signed)
New Message   [facility] sent a fax on Friday, yesterday and Today, regaurding a lower cost medication for the PT Please call

## 2018-09-26 NOTE — Telephone Encounter (Signed)
Spoke with patient and advised. She will come today for just LP/HFP.

## 2018-09-28 NOTE — Telephone Encounter (Signed)
The patient was found to have high-grade 95% proximal LAD stenosis and underwent intervention in late July 2019.  At this point I do not recommend transitioning from Brilinta to Plavix.  I would recommend continuing aspirin/Brilinta for minimum of 12 months prior to consideration of switching to generic clopidogrel and she will then require P2 Y 12 testing to make certain she is clopidogrel responsive

## 2018-09-28 NOTE — Telephone Encounter (Signed)
FYI

## 2018-09-29 ENCOUNTER — Telehealth: Payer: Self-pay | Admitting: Cardiovascular Disease

## 2018-09-29 NOTE — Telephone Encounter (Signed)
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 They are requested a change from [place]. Janace Hoard will refax form. Adv her that Dr.kelly and his nurse Almyra Free was out of the office this afternoon.  Senaida Lange of Dr.Kelly's response. Troy Sine, MD 5:49 PM  Note    The patient was found to have high-grade 95% proximal LAD stenosis and underwent intervention in late July 2019.  At this point I do not recommend transitioning from Brilinta to Plavix.  I would recommend continuing aspirin/Brilinta for minimum of 12 months prior to consideration of switching to generic clopidogrel and she will then require P2 Y 12 testing to make certain she is clopidogrel responsive     Gabby sts that they would need the form that was faxed over to be completed and signed by [facility]. Senaida Lange that I could not confirm that Essie Christine nurse has received the fax. Provided our nurse fax number and asked Abby to fax it again just in case. Adv her that I will fwd the message to Almyra Free when she returns on Monday to f/u.

## 2018-09-29 NOTE — Telephone Encounter (Signed)
Called patient, notified of MD response to switching medication, LVM with call back number.  Attempted to contact gabby with insurance company, phone number given rang x8 times, no voicemail and hung up.

## 2018-10-03 NOTE — Telephone Encounter (Signed)
Fax was filled out that authorization was not given to switch and why. Faxed back today 10/03/2018

## 2018-10-16 ENCOUNTER — Ambulatory Visit (INDEPENDENT_AMBULATORY_CARE_PROVIDER_SITE_OTHER): Payer: PRIVATE HEALTH INSURANCE | Admitting: Cardiovascular Disease

## 2018-10-16 ENCOUNTER — Encounter: Payer: Self-pay | Admitting: Cardiovascular Disease

## 2018-10-16 VITALS — BP 112/58 | HR 59 | Ht 62.0 in | Wt 141.0 lb

## 2018-10-16 DIAGNOSIS — F419 Anxiety disorder, unspecified: Secondary | ICD-10-CM

## 2018-10-16 DIAGNOSIS — I2510 Atherosclerotic heart disease of native coronary artery without angina pectoris: Secondary | ICD-10-CM

## 2018-10-16 DIAGNOSIS — I10 Essential (primary) hypertension: Secondary | ICD-10-CM | POA: Diagnosis not present

## 2018-10-16 DIAGNOSIS — E785 Hyperlipidemia, unspecified: Secondary | ICD-10-CM

## 2018-10-16 NOTE — Progress Notes (Signed)
Cardiology Office Note    Date:  10/16/2018   ID:  [name], [name] 12-16-1950, MRN [id number]  PCP:  [name], MD  Cardiologist:  Shelva Majestic, MD   No chief complaint on file.  F/U Cardiology evaluation; self referred  History of Present Illness:  [name] is a 67 y.o. female who presents for follow-up cardiology evaluation following her PCI to her LAD.  I had seen her for initial evaluation in May 2019 when she was self-referred but advised to see me through her friend Candelaria Stagers.  She presents for a 4 month follow-up evaluation.  [name] is a 68 year old divorced female who was told of having a possible heart murmur as a child and also had childhood asthma.  He has a history of breast CA and in 2012 underwent double mastectomy.  She subsequently underwent gel implants.  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  Her new job has created increased work-related stress.  Recently, she has noticed a light pain sensation in her jaw.  She has noticed some mild chest tightness extending to her back which seems to be more stress mediated.  It also seems to be related to some increased anxiety.  She denies any chest pain with exercise.  She admits to recent increased belching which seems to improve some of her jaw discomfort.  She was told of having a hiatal hernia.  She does not routinely exercise but she walks the hallways at work.  He has had some issues with bilateral lower extremity swelling which was felt to be multifactorial.  She saw Dr. Anice Paganini and underwent LE doppler imaging.  Is no evidence for DVT.  He did have abnormal reflux times in the common femoral vein.  Some chronic venous insufficiency in the deep venous system of the left.  There was no need for intervention but compression stockings were recommended.  Because of recurrent chest and jaw symptomatology, she presented for an initial evaluation with me  on Dec 15, 2017.  I scheduled her to undergo a CT coronary angio.  The CT scan was done on 12/28/2017 and demonstrated a calcium score of 6.  There was mild calcium noted in the left main and mid LAD.  The left main had less than 20% calcific stenosis, the LAD had 50 to 75% mid LAD stenosis with mixed plaque.  There was a normal circumflex and RCA.  FFR was positive in the LAD territory at 0.5.  Chest CT also showed nonspecific 2 mm left upper lobe nodule which was felt most likely to be benign.  Follow-up was needed if the patient is low risk but if patient was high risk noncontrast chest CT could be considered in 12 months.  When I received notification of her CT scan results, we contacted the patient, initiated aspirin 81 mg, metoprolol 12.5 mg twice a day and gave her a prescription for sublingual nitroglycerin.  She had experienced exertional chest pain particularly with uphill walking.  I recommended she undergo cardiac catheterization which I performed on March 07, 2018.  She was found to have single-vessel CAD with 95% fibrotic appearing mid LAD stenosis distal to a proximal septal diagonal vessel and underwent successful stent PCI with Cutting Balloon and DES stenting with insertion of Resolute Onyx 2.5 x 18 mm stent with no suspect reduced to 0.  Subsequently, he was readmitted to the hospital several days later with recurrent chest  pain, palpitations and hypertension.  Her chest pain was felt to be related to back and anxiety.  Over the last several months she has continued to be stable from a cardiac standpoint.  She denies any recurrent chest pain.  She had taken 26 weeks off for medical leave from work due to some of the anxiety and she is now back at work.  She denies chest tightness.  At times she notes some mild dizziness.  She has had issues with constipation and was recently started on a probiotic by GI.  She has been participating in phase 2 of the cardiac rehab.    Since I last saw her in  November 2019, she has continued to do well.  She specifically denies any exertional chest pain symptomatology.  She denies any shortness of breath.  She has felt well.  He completed cardiac rehab August 14, 2018.  She is unaware of palpitations presyncope or syncope.  She underwent recent laboratory which tended to show significant benefit on atorvastatin 40 mg whether total cholesterol 114, LDL cholesterol 49, HDL 47 and triglycerides 88.  She continues to be on low-dose Toprol-XL 12.5 mg and amlodipine 2.5 mg daily for her blood pressure.  She is on dual antiplatelet therapy with aspirin and Brilinta.  She presents for follow-up evaluation.  Past Medical History:  Diagnosis Date  . Allergy    "dust mite residue, mold, mildew, grapefruit, chili peppers, cat dander, some weeds" (03/07/2018)  . Anxiety   . Arthritis    "minor; right knee, left hip" (03/07/2018)  . Bruises easily   . Cancer of left breast ([place])    double mastectomy 01/26/2011  . Childhood asthma   . Contact lens/glasses fitting   . Coronary artery disease    a.  She underwent LHC 7/23 which revealed 95% stenosis of proximal LAD, managed with PCI/DES.   . Depression   . Essential hypertension   . GERD (gastroesophageal reflux disease)    "at times" (03/07/2018)  . Heart murmur   . Hiatal hernia   . High cholesterol   . History of kidney stones    "lots; no OR" (03/07/2018)  . Osteoporosis   . Rectal bleeding    hemorroid  . Ringing in ears, bilateral    "since ~ 06/2017" (03/07/2018)  . Sinus drainage     Past Surgical History:  Procedure Laterality Date  . APPENDECTOMY    . BREAST LUMPECTOMY W/ NEEDLE LOCALIZATION Left 10/05/2010   lumpectomy  . BREAST SURGERY    . CORONARY STENT INTERVENTION N/A 03/07/2018   Procedure: CORONARY STENT INTERVENTION;  Surgeon: Troy Sine, MD;  Location: [facility];  Service: Cardiovascular;  Laterality: N/A;  . LEFT HEART CATH AND CORONARY ANGIOGRAPHY N/A 03/07/2018    Procedure: LEFT HEART CATH AND CORONARY ANGIOGRAPHY;  Surgeon: Troy Sine, MD;  Location: [facility];  Service: Cardiovascular;  Laterality: N/A;  . MASTECTOMY Right 01/26/2011  . MASTECTOMY COMPLETE / SIMPLE W/ SENTINEL NODE BIOPSY Left 01/26/2011  . OVARIAN CYST SURGERY  1997, 2001, 2008    Current Medications: Outpatient Medications Prior to Visit  Medication Sig Dispense Refill  . albuterol (PROVENTIL HFA;VENTOLIN HFA) 108 (90 Base) MCG/ACT inhaler Inhale 1-2 puffs into the lungs every 4 (four) hours as needed for wheezing or shortness of breath. 1 Inhaler 0  . amLODipine (NORVASC) 2.5 MG tablet Take 1 tablet (2.5 mg total) by mouth daily. 90 tablet 3  . aspirin EC 81 MG  tablet Take 1 tablet (81 mg total) by mouth daily. 90 tablet 3  . atorvastatin (LIPITOR) 80 MG tablet Take 0.5 tablets (40 mg total) by mouth daily at 6 PM.    . cetirizine (ZYRTEC) 10 MG tablet Take 10 mg by mouth daily as needed for allergies.     Marland Kitchen FLUoxetine (PROZAC) 20 MG tablet Take 2 tablets (40 mg total) by mouth daily. 180 tablet 1  . fluticasone (FLONASE) 50 MCG/ACT nasal spray Place 2 sprays into both nostrils daily. 16 g 6  . metoprolol succinate (TOPROL XL) 25 MG 24 hr tablet Take 0.5 tablets (12.5 mg total) by mouth daily. 30 tablet 6  . montelukast (SINGULAIR) 10 MG tablet Take 1 tablet (10 mg total) by mouth at bedtime. 30 tablet 3  . nitroGLYCERIN (NITROSTAT) 0.4 MG SL tablet PLACE 1 TABLET (0.4 MG TOTAL) UNDER THE TONGUE EVERY 5 (FIVE) MINUTES AS NEEDED FOR CHEST PAIN.  3  . Probiotic Product (ALIGN) 4 MG CAPS Take 1 capsule by mouth daily.    . ticagrelor (BRILINTA) 90 MG TABS tablet Take 1 tablet (90 mg total) by mouth 2 (two) times daily. 180 tablet 3  . traZODone (DESYREL) 50 MG tablet TAKE 1/2 TO 1 TABLET BY MOUTH AT BEDTIME AS NEEDED FOR SLEEP 90 tablet 0  . azithromycin (ZITHROMAX) 250 MG tablet Take 2 tabs PO x 1 dose, then 1 tab PO QD x 4 days 6 tablet 0  . benzonatate (TESSALON) 200  MG capsule Take 1 capsule (200 mg total) by mouth 3 (three) times daily as needed for cough. 40 capsule 0  . chlorpheniramine-HYDROcodone (TUSSIONEX PENNKINETIC ER) 10-8 MG/5ML SUER Take 5 mLs by mouth every 12 (twelve) hours as needed. 120 mL 0   No facility-administered medications prior to visit.      Allergies:   Elavil [amitriptyline hcl] and Sulfur   Social History   Socioeconomic History  . Marital status: Divorced    Spouse name: Not on file  . Number of children: Not on file  . Years of education: Not on file  . Highest education level: Bachelor's degree (e.g., BA, AB, BS)  Occupational History  . Not on file  Social Needs  . Financial resource strain: Not on file  . Food insecurity:    Worry: Not on file    Inability: Not on file  . Transportation needs:    Medical: Not on file    Non-medical: Not on file  Tobacco Use  . Smoking status: Never Smoker  . Smokeless tobacco: Never Used  Substance and Sexual Activity  . Alcohol use: Yes    Comment: 03/07/2018 "probably once/month"  . Drug use: No  . Sexual activity: Not on file  Lifestyle  . Physical activity:    Days per week: Not on file    Minutes per session: Not on file  . Stress: Not on file  Relationships  . Social connections:    Talks on phone: Not on file    Gets together: Not on file    Attends religious service: Not on file    Active member of club or organization: Not on file    Attends meetings of clubs or organizations: Not on file    Relationship status: Not on file  Other Topics Concern  . Not on file  Social History Narrative   Lives at home alone   Right handed   Caffeine: 1/2 cup of coffee daily that has (1/2 & 1/2 caf)  Social history is notable that she is divorced from her second marriage.  She was married initially at a young age and then divorced.  She was single for 20 years.  Her second marriage was of 6 years duration.  She is divorced for 4 years.  She graduated from [employer] of the arts in music.  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  Family History:  The patient's family history includes Cancer in her brother and sister; Diabetes in her sister; Heart Problems in her mother; Heart failure in her paternal grandfather and paternal grandmother; Hyperlipidemia in her mother; Hypertension in her mother; Hypotension in her father; Other in her father.  Parents lived to an old age with her mother dying at 68 and her father dying at 45.  One brother died at age 30 with cancer and had diabetes.  One living brother and one living sister.  ROS General: Negative; No fevers, chills, or night sweats;  HEENT: Negative; No changes in vision or hearing, sinus congestion, difficulty swallowing Pulmonary: Negative; No cough, wheezing, shortness of breath, hemoptysis Cardiovascular: Negative; No chest pain, presyncope, syncope, palpitations GI: Positive for hiatal hernia GU: Negative; No dysuria, hematuria, or difficulty voiding Musculoskeletal: Negative; no myalgias, joint pain, or weakness Hematologic/Oncology:   estrogen receptor positive breast cancer Endocrine: Negative; no heat/cold intolerance; no diabetes Neuro: Negative; no changes in balance, headaches Skin: Negative; No rashes or skin lesions Psychiatric: Negative; No behavioral problems, depression Sleep: Negative; No snoring, daytime sleepiness, hypersomnolence, bruxism, restless legs, hypnogognic hallucinations, no cataplexy Other comprehensive 14 point system review is negative.   PHYSICAL EXAM:   VS:  BP (!) 112/58   Pulse (!) 59   Ht 5' 2"  (1.575 m)   Wt 141 lb (64 kg)   LMP 09/10/2006   BMI 25.79 kg/m     Repeat blood pressure by me was 126/64  Wt Readings from Last 3 Encounters:  10/16/18 141 lb (64 kg)  07/29/18 143 lb 3.2 oz (65 kg)  07/10/18 145 lb 11.6 oz (66.1 kg)    General:  Alert, oriented, no distress.  Skin: normal turgor, no rashes, warm and dry HEENT: Normocephalic, atraumatic. Pupils equal round and reactive to light; sclera anicteric; extraocular muscles intact;  Nose without nasal septal hypertrophy Mouth/Parynx benign; Mallinpatti scale 3 Neck: No JVD, no carotid bruits; normal carotid upstroke Lungs: clear to ausculatation and percussion; no wheezing or rales Chest wall: without tenderness to palpitation Heart: PMI not displaced, RRR, s1 s2 normal, 1/6 systolic murmur, no diastolic murmur, no rubs, gallops, thrills, or heaves Abdomen: soft, nontender; no hepatosplenomehaly, BS+; abdominal aorta nontender and not dilated by palpation. Back: no CVA tenderness Pulses 2+ Musculoskeletal: full range of motion, normal strength, no joint deformities Extremities: no clubbing cyanosis or edema, Homan's sign negative  Neurologic: grossly nonfocal; Cranial nerves grossly wnl Psychologic: Normal mood and affect   Studies/Labs Reviewed:   EKG:  EKG is ordered today.  ECG (independently read by me): Sinus bradycardia 59 bpm.  QS complex V1 V2.  Normal intervals.  No ectopy.  November 2019 ECG (independently read by me): Sinus rhythm at 60 bpm.  No ectopy.  Normal intervals  July 2019 ECG (independently read by me): Sinus bradycardia 52 bpm.  No ST segment changes  Dec 15, 2017 ECG (independently read by me): Sinus rhythm at 66 bpm.  No ectopy.  Normal intervals.  No ST segment changes.  Recent Labs: BMP Latest Ref Rng & Units 07/06/2018 04/14/2018 03/10/2018  Glucose 65 - 99 mg/dL 90 89 94  BUN 8 - 27 mg/dL 10 16 14   Creatinine 0.57 - 1.00 mg/dL 0.90 0.81 0.87  BUN/Creat Ratio 12 - 28 11(L) 20 -  Sodium 134 - 144 mmol/L 139 145(H) 141  Potassium 3.5 - 5.2 mmol/L 4.7 3.9 4.1  Chloride 96 - 106 mmol/L 101 105 104  CO2 20 - 29 mmol/L 23 24 26   Calcium 8.7 - 10.3 mg/dL 9.7 9.5 9.6     Hepatic Function Latest Ref Rng & Units 09/26/2018 07/06/2018 09/24/2017    Total Protein 6.0 - 8.5 g/dL 6.8 6.6 7.0  Albumin 3.8 - 4.8 g/dL 4.6 4.6 4.7  AST 0 - 40 IU/L 26 40 18  ALT 0 - 32 IU/L 29 55(H) 16  Alk Phosphatase 39 - 117 IU/L 159(H) 151(H) 120(H)  Total Bilirubin 0.0 - 1.2 mg/dL 1.0 1.7(H) 0.7  Bilirubin, Direct 0.00 - 0.40 mg/dL 0.24 - -    CBC Latest Ref Rng & Units 07/06/2018 04/14/2018 03/10/2018  WBC 3.4 - 10.8 x10E3/uL 5.9 8.5 7.4  Hemoglobin 11.1 - 15.9 g/dL 12.9 12.5 13.3  Hematocrit 34.0 - 46.6 % 38.4 39.0 40.3  Platelets 150 - 450 x10E3/uL 247 - 239   Lab Results  Component Value Date   MCV 93 07/06/2018   MCV 92.6 04/14/2018   MCV 95.7 03/10/2018   Lab Results  Component Value Date   TSH 1.410 12/27/2016   Lab Results  Component Value Date   HGBA1C 5.8 (H) 07/06/2018     BNP No results found for: BNP  ProBNP No results found for: PROBNP   Lipid Panel     Component Value Date/Time   CHOL 114 09/26/2018 1005   TRIG 88 09/26/2018 1005   HDL 47 09/26/2018 1005   CHOLHDL 2.4 09/26/2018 1005   CHOLHDL 3.9 01/17/2016 0937   VLDL 37 (H) 01/17/2016 0937   LDLCALC 49 09/26/2018 1005     RADIOLOGY: No results found.   Additional studies/ records that were reviewed today include:  Reviewed records from Dr. Cindee Lame.  I also reviewed her vascular surgery evaluation by Dr. Donzetta Matters. Recent laboratory from September 26, 2018 was reviewed  ------------------------------------------------------------------- Jan 02, 2018  ECHO study Conclusions  - Left ventricle: The cavity size was normal. Wall thickness was   normal. Systolic function was normal. The estimated ejection   fraction was in the range of 55% to 60%. Wall motion was normal;   there were no regional wall motion abnormalities. Features are   consistent with a pseudonormal left ventricular filling pattern,   with concomitant abnormal relaxation and increased filling   pressure (grade 2 diastolic dysfunction). - Aortic valve: Valve mobility was restricted.  There was mild   regurgitation.  Impressions:  - Normal LV systolic function; moderate diastolic dysfunction;   sclerotic aortic valve with mild AI.    CT Coronary Angio  IMPRESSION: 1.  Aortic root normal 3.3 cm  2.  Calcium score 6 which is 22 th percentile for age and sex  41.  50-75% mid LAD stenosis study will be sent for FFR CT  FFR: IMPRESSION: Hemodynamically significant mid LAD stenosis with FFR CT .50  ------------------------------------------------------------------------------------------------------- 03/07/2018: Cath/PCI Single-vessel coronary obstructive disease with a 95% fibrotic appearing mid LAD stenosis distal to a proximal septal and diagonal vessel.  Normal left circumflex and dominant RCA.  LVEDP 21 mm.  Successful PCI to the mid LAD utilizing Cutting Balloon and DES stenting with a 2.5 x 18 mm Resolute Onyx stent with the 95% stenosis being reduced to 0% evidence for brisk TIMI-3 flow with no evidence for dissection at the completion of the procedure.  RECOMMENDATION:  Recommend uninterrupted dual antiplatelet therapy with Aspirin 51m daily and Ticagrelor 993mtwice daily for a minimum of 12 months (ACS - Class I recommendation).  The patient will continue with low-dose beta-blocker therapy.  Aggressive lipid-lowering therapy with high potency statin.    ASSESSMENT:    1. CAD in native artery: s/p PCI with DES stent to LAD 03/07/18   2. Hyperlipidemia with target LDL less than 70   3. Essential hypertension   4. Anxiety     PLAN:  Ellen WaGeorgie Eduardos a very pleasant [age]ear old female aware of any prior cardiac disease but when seen by me 2019 had experienced episodes of jaw discomfort which seemed to improve with belching and also  noticed some vague chest fullness and at times has noted this in her back.  These occurred non-exertionally but in retrospect she admitted that perhaps when she had walked uphill she noticed more jaw  discomfort and belching.  When I initially saw her she was noticing an increase in work-related stress which seems to play a role in symptom development. A 2D echo Doppler study which was done on Jan 02, 2018 which revealed normal systolic function with an EF of 55 to 60% and grade 2 diastolic dysfunction.  There was mild aortic sclerosis with mild aortic insufficiency.  Although her cardiac calcium score was 6, she was found to have 95% fibrotic appearing mid LAD stenosis for which she underwent successful intervention.  Her prior chest tightness has improved.  She has had issues with anxiety and required medical leave from work for approximately 6 weeks.  As I saw her, she has continued to do exceptionally well.  She completed cardiac rehab.  I last saw her her blood pressure was low and I reduced her amlodipine to 2.5 mg.  She continues to take Toprol-XL 12.5 mg.  Her ECG remained stable with sinus bradycardia at 59 bpm.  Not had any of the previous discomfort and belching that she had experienced prior to her intervention.  I reviewed her recent most recent laboratory.  Lipid studies are excellent on her current dose of atorvastatin 40 mg daily.  I have recommended she continue dual antiplatelet therapy.  She is not having any bleeding.  I discussed continued exercise which she has not done as much since cardiac rehab ended due to the weather and rain.  She continues to be on fluoxetine 40 mg.  I will see her in 6 months for cardiology reevaluation or sooner if problems arise.  Time spent: 25 minutes  Medication Adjustments/Labs and Tests Ordered: Current medicines are reviewed at length with the patient today.  Concerns regarding medicines are outlined above.  Medication changes, Labs and Tests ordered today are listed in the Patient Instructions below. There are no Patient Instructions on file for this visit.   Signed, ThShelva MajesticMD  10/16/2018 5:51 PM    [facility] [id number]SuLansdowneGrFarm[place]  [id number]: [phone]

## 2018-10-17 ENCOUNTER — Other Ambulatory Visit: Payer: Self-pay | Admitting: Family Medicine

## 2018-10-17 DIAGNOSIS — F411 Generalized anxiety disorder: Secondary | ICD-10-CM

## 2018-11-14 ENCOUNTER — Other Ambulatory Visit: Payer: Self-pay | Admitting: Family Medicine

## 2018-11-14 DIAGNOSIS — F411 Generalized anxiety disorder: Secondary | ICD-10-CM

## 2018-11-21 ENCOUNTER — Telehealth: Payer: Self-pay | Admitting: Cardiovascular Disease

## 2018-11-21 ENCOUNTER — Other Ambulatory Visit: Payer: Self-pay

## 2018-11-21 DIAGNOSIS — Z79899 Other long term (current) drug therapy: Secondary | ICD-10-CM

## 2018-11-21 DIAGNOSIS — Z131 Encounter for screening for diabetes mellitus: Secondary | ICD-10-CM

## 2018-11-21 NOTE — Telephone Encounter (Signed)
Called patient, she states last time blood was drawn it was too soon for her A1C check, she has been having some sensations and would like to have it checked, notified I could order it as Dr.Kelly ordered last time.   Patient was appreciative.  Verbalized understanding.

## 2018-11-21 NOTE — Telephone Encounter (Signed)
° ° °  Patient requesting lab order to check A1C

## 2018-11-23 ENCOUNTER — Other Ambulatory Visit: Payer: Self-pay

## 2018-11-23 DIAGNOSIS — Z131 Encounter for screening for diabetes mellitus: Secondary | ICD-10-CM

## 2018-11-23 DIAGNOSIS — Z79899 Other long term (current) drug therapy: Secondary | ICD-10-CM

## 2018-11-24 LAB — HEMOGLOBIN A1C
Est. average glucose Bld gHb Est-mCnc: 117 mg/dL
Hgb A1c MFr Bld: 5.7 % — ABNORMAL HIGH (ref 4.8–5.6)

## 2018-12-18 ENCOUNTER — Other Ambulatory Visit: Payer: Self-pay | Admitting: Family Medicine

## 2018-12-18 DIAGNOSIS — G4700 Insomnia, unspecified: Secondary | ICD-10-CM

## 2018-12-18 NOTE — Telephone Encounter (Signed)
Please review for refill of trazodone 50 mg tab. And to take 1/2 to 1 tab at night for sleep. LR 09-26-2018 for 90 tabs Is the patient taking 1/2 tab or a whole one? LOV 07-29-2018 with Dr. Brigitte Pulse. No NOV.

## 2018-12-18 NOTE — Telephone Encounter (Signed)
Refilled for same.  Should be due for OV in next few months.

## 2018-12-18 NOTE — Telephone Encounter (Signed)
Is this okay to refill? 

## 2019-01-07 ENCOUNTER — Other Ambulatory Visit: Payer: Self-pay | Admitting: Family Medicine

## 2019-01-07 DIAGNOSIS — F411 Generalized anxiety disorder: Secondary | ICD-10-CM

## 2019-02-23 ENCOUNTER — Other Ambulatory Visit: Payer: Self-pay

## 2019-02-23 ENCOUNTER — Ambulatory Visit: Payer: Medicare Other | Admitting: Family Medicine

## 2019-02-23 ENCOUNTER — Encounter: Payer: Self-pay | Admitting: Family Medicine

## 2019-02-23 VITALS — BP 126/70 | HR 69 | Temp 98.2°F | Resp 18 | Wt 136.8 lb

## 2019-02-23 DIAGNOSIS — I2510 Atherosclerotic heart disease of native coronary artery without angina pectoris: Secondary | ICD-10-CM

## 2019-02-23 DIAGNOSIS — F418 Other specified anxiety disorders: Secondary | ICD-10-CM | POA: Diagnosis not present

## 2019-02-23 DIAGNOSIS — F411 Generalized anxiety disorder: Secondary | ICD-10-CM

## 2019-02-23 DIAGNOSIS — G4700 Insomnia, unspecified: Secondary | ICD-10-CM | POA: Diagnosis not present

## 2019-02-23 DIAGNOSIS — F439 Reaction to severe stress, unspecified: Secondary | ICD-10-CM

## 2019-02-23 MED ORDER — TRAZODONE HCL 50 MG PO TABS: 25.0000 mg | ORAL_TABLET | Freq: Every evening | ORAL | 2 refills | Status: DC | PRN

## 2019-02-23 MED ORDER — FLUOXETINE HCL 60 MG PO TABS: 60.0000 mg | ORAL_TABLET | Freq: Every day | ORAL | 1 refills | Status: DC

## 2019-02-23 NOTE — Patient Instructions (Signed)
Call your therapist/counselor appointment as soon as possible to discuss stressors including workplace concerns. I do recommend walking nurse if not all days per week as it has been helpful for your stress.  See other stress management techniques below.  Walking may need to be done early in the morning before work.  Okay to continue half dose of trazodone at bedtime, increase fluoxetine to 60 mg daily.  Recheck in the next 4 weeks.  If still having some difficulty, would recommend meeting with psychiatry to review medications and different options. Return to the clinic or go to the nearest emergency room if any of your symptoms worsen or new symptoms occur.   Stress Stress is a normal reaction to life events. Stress is what you feel when life demands more than you are used to, or more than you think you can handle. Some stress can be useful, such as studying for a test or meeting a deadline at work. Stress that occurs too often or for too long can cause problems. It can affect your emotional health and interfere with relationships and normal daily activities. Too much stress can weaken your body's defense system (immune system) and increase your risk for physical illness. If you already have a medical problem, stress can make it worse. What are the causes? All sorts of life events can cause stress. An event that causes stress for one person may not be stressful for another person. Major life events, whether positive or negative, commonly cause stress. Examples include:  Losing a job or starting a new job.  Losing a loved one.  Moving to a new town or home.  Getting married or divorced.  Having a baby.  Injury or illness. Less obvious life events can also cause stress, especially if they occur day after day or in combination with each other. Examples include:  Working long hours.  Driving in traffic.  Caring for children.  Being in debt.  Being in a difficult relationship. What are  the signs or symptoms? Stress can cause emotional symptoms, including:  Anxiety. This is feeling worried, afraid, on edge, overwhelmed, or out of control.  Anger, including irritation or impatience.  Depression. This is feeling sad, down, helpless, or guilty.  Trouble focusing, remembering, or making decisions. Stress can cause physical symptoms, including:  Aches and pains. These may affect your head, neck, back, stomach, or other areas of your body.  Tight muscles or a clenched jaw.  Low energy.  Trouble sleeping. Stress can cause unhealthy behaviors, including:  Eating to feel better (overeating) or skipping meals.  Working too much or putting off tasks.  Smoking, drinking alcohol, or using drugs to feel better. How is this diagnosed? Stress is diagnosed through an assessment by your health care provider. He or she may diagnose this condition based on:  Your symptoms and any stressful life events.  Your medical history.  Tests to rule out other causes of your symptoms. Depending on your condition, your health care provider may refer you to a specialist for further evaluation. How is this treated?  Stress management techniques are the recommended treatment for stress. Medicine is not typically recommended for the treatment of stress. Techniques to reduce your reaction to stressful life events include:  Stress identification. Monitor yourself for symptoms of stress and identify what causes stress for you. These skills may help you to avoid or prepare for stressful events.  Time management. Set your priorities, keep a calendar of events, and learn to say "no."  Taking these actions can help you avoid making too many commitments. Techniques for coping with stress include:  Rethinking the problem. Try to think realistically about stressful events rather than ignoring them or overreacting. Try to find the positives in a stressful situation rather than focusing on the  negatives.  Exercise. Physical exercise can release both physical and emotional tension. The key is to find a form of exercise that you enjoy and do it regularly.  Relaxation techniques. These relax the body and mind. The key is to find one or more that you enjoy and use the technique(s) regularly. Examples include: ? Meditation, deep breathing, or progressive relaxation techniques. ? Yoga or tai chi. ? Biofeedback, mindfulness techniques, or journaling. ? Listening to music, being out in nature, or participating in other hobbies.  Practicing a healthy lifestyle. Eat a balanced diet, drink plenty of water, limit or avoid caffeine, and get plenty of sleep.  Having a strong support network. Spend time with family, friends, or other people you enjoy being around. Express your feelings and talk things over with someone you trust. Counseling or talk therapy with a mental health professional may be helpful if you are having trouble managing stress on your own. Follow these instructions at home: Lifestyle   Avoid drugs.  Do not use any products that contain nicotine or tobacco, such as cigarettes and e-cigarettes. If you need help quitting, ask your health care provider.  Limit alcohol intake to no more than 1 drink a day for nonpregnant women and 2 drinks a day for men. One drink equals 12 oz of beer, 5 oz of wine, or 1 oz of hard liquor.  Do not use alcohol or drugs to relax.  Eat a balanced diet that includes fresh fruits and vegetables, whole grains, lean meats, fish, eggs, and beans, and low-fat dairy. Avoid processed foods and foods high in added fat, sugar, and salt.  Exercise at least 30 minutes on 5 or more days each week.  Get 7-8 hours of sleep each night. General instructions   Practice stress management techniques as discussed with your health care provider.  Drink enough fluid to keep your urine clear or pale yellow.  Take over-the-counter and prescription medicines only  as told by your health care provider.  Keep all follow-up visits as told by your health care provider. This is important. Contact a health care provider if:  Your symptoms get worse.  You have new symptoms.  You feel overwhelmed by your problems and can no longer manage them on your own. Get help right away if:  You have thoughts of hurting yourself or others. If you ever feel like you may hurt yourself or others, or have thoughts about taking your own life, get help right away. You can go to your nearest emergency department or call:  Your local emergency services (911 in the [place]  A suicide crisis helpline, such as the [facility] at (319)393-0084. This is open 24 hours a day. Summary  Stress is a normal reaction to life events. It can cause problems if it happens too often or for too long.  Practicing stress management techniques is the best way to treat stress.  Counseling or talk therapy with a mental health professional may be helpful if you are having trouble managing stress on your own. This information is not intended to replace advice given to you by your health care provider. Make sure you discuss any questions you have with your health care  provider. Document Released: 01/26/2001 Document Revised: 07/15/2017 Document Reviewed: 09/22/2016 Elsevier Patient Education  [employer].    If you have lab work done today you will be contacted with your lab results within the next 2 weeks.  If you have not heard from [place] then please contact us. The fastest way to get your results is to register for My Chart.   IF you received an x-ray today, you will receive an invoice from [facility]. Please contact [facility] at [phone] with questions or concerns regarding your invoice.   IF you received labwork today, you will receive an invoice from [facility]. Please contact [facility] at [phone] with questions or concerns  regarding your invoice.   Our billing staff will not be able to assist you with questions regarding bills from these companies.  You will be contacted with the lab results as soon as they are available. The fastest way to get your results is to activate your My Chart account. Instructions are located on the last page of this paperwork. If you have not heard from [place] regarding the results in 2 weeks, please contact this office.

## 2019-02-23 NOTE — Progress Notes (Signed)
Subjective:    Patient ID: [name], female    DOB: March 12, 1951, 68 y.o.   MRN: [id number]  HPI LAURRIE Thompson is a 68 y.o. female Presents today for: Chief Complaint  Patient presents with   Anxiety    x1 yr   Anxiety: Last discussed in office in October 2019.  Thought to have component of adjustment disorder.  See prior notes.  temporarily was placed out of work due to significant anxiety symptoms.  Was continued on 40 mg of Prozac daily, hydroxyzine for difficulty with sleep, referred to sleep specialist, and counseling.  Improving at October 15 follow-up.  Recommended trial of trazodone at bedtime, continued on Prozac, 1 month follow-up discussed.  Feels like taking a downward turn. More anxious, more depressed - unhappy with current  Had been out of work for 8 weeks, then returned. Returned in November of last year - initially ok.  Let ex husband back in life -tried to hang out for awhile, gave him a place to stay 0- tried friendship, but he had different thoughts/plans. Last lived with her months ago. He is now remarried.  Increased stress at work - more criticism, increased complaints recently.  Has not met with therapist in past 8-9 months. Has not called yet for appointment. Has been taking prozac 88m QD - no missed doses.  Has been taking trazodone - 1/2 pill at bedtime. Helping with sleep.  Still vivid dreams.  Walking for exercise - 2 times per week. (feels better after walking). Fatigue at night after work. - trouble with energy at night to walk. Too hot to walk during work. Easier to get in walking on weekends.   Workplace appears to be primary area of stress at this time.  Out of work for the past 3 days due to depression/anxiety symptoms.  Needs note for work.  Denies suicidal ideation.  GAD 7 : Generalized Anxiety Score 02/23/2019  Nervous, Anxious, on Edge 1  Control/stop worrying 3  Worry too much - different things 3  Trouble relaxing 1  Restless 1    Easily annoyed or irritable 0  Afraid - awful might happen 3  Total GAD 7 Score 12  Anxiety Difficulty Not difficult at all     [facility] 02/23/2019 07/29/2018 05/30/2018 05/16/2018 05/10/2018  Decreased Interest 1 0 1 1 2   Down, Depressed, Hopeless 1 0 1 1 2   PHQ - 2 Score 2 0 2 2 4   Altered sleeping 0 - 3 2 1   Tired, decreased energy 1 - 2 2 1   Change in appetite 0 - 1 1 1   Feeling bad or failure about yourself  1 - 1 2 2   Trouble concentrating 0 - 0 0 1  Moving slowly or fidgety/restless 0 - 0 0 1  Suicidal thoughts 0 - 0 0 0  PHQ-9 Score 4 - 9 9 11   Difficult doing work/chores Somewhat difficult - Somewhat difficult Somewhat difficult Extremely dIfficult         Patient Active Problem List   Diagnosis Date Noted   Absolute anemia 06/13/2018   Fatigue associated with anemia 06/13/2018   Abnormal dreams 06/13/2018   Depression 06/13/2018   RLS (restless legs syndrome) 04/05/2018   Atypical chest pain 03/10/2018   CAD in native artery 03/10/2018   Elevated blood pressure reading 03/10/2018   Palpitations 03/10/2018   Chest pain 03/09/2018   Hyperlipidemia 03/08/2018   Coronary artery disease involving native coronary artery of native heart  with unstable angina pectoris ([place]) 03/07/2018   Unstable angina ([place])    Cough 01/02/2013   Anxiety state 01/02/2013   Breast cancer (DCIS), stage 0, Left, receptor +, dx 2012 09/08/2010   Past Medical History:  Diagnosis Date   Allergy    "dust mite residue, mold, mildew, grapefruit, chili peppers, cat dander, some weeds" (03/07/2018)   Anxiety    Arthritis    "minor; right knee, left hip" (03/07/2018)   Bruises easily    Cancer of left breast ([place])    double mastectomy 01/26/2011   Childhood asthma    Contact lens/glasses fitting    Coronary artery disease    a.  She underwent LHC 7/23 which revealed 95% stenosis of proximal LAD, managed with PCI/DES.    Depression    Essential  hypertension    GERD (gastroesophageal reflux disease)    "at times" (03/07/2018)   Heart murmur    Hiatal hernia    High cholesterol    History of kidney stones    "lots; no OR" (03/07/2018)   Osteoporosis    Rectal bleeding    hemorroid   Ringing in ears, bilateral    "since ~ 06/2017" (03/07/2018)   Sinus drainage    Past Surgical History:  Procedure Laterality Date   APPENDECTOMY     BREAST LUMPECTOMY W/ NEEDLE LOCALIZATION Left 10/05/2010   lumpectomy   BREAST SURGERY     CORONARY STENT INTERVENTION N/A 03/07/2018   Procedure: CORONARY STENT INTERVENTION;  Surgeon: Troy Sine, MD;  Location: [place] [facility];  Service: Cardiovascular;  Laterality: N/A;   LEFT HEART CATH AND CORONARY ANGIOGRAPHY N/A 03/07/2018   Procedure: LEFT HEART CATH AND CORONARY ANGIOGRAPHY;  Surgeon: Troy Sine, MD;  Location: [place] [facility];  Service: Cardiovascular;  Laterality: N/A;   MASTECTOMY Right 01/26/2011   MASTECTOMY COMPLETE / SIMPLE W/ SENTINEL NODE BIOPSY Left 01/26/2011   OVARIAN CYST SURGERY  1997, 2001, 2008   Allergies  Allergen Reactions   Elavil [Amitriptyline Hcl] Rash   Sulfur Rash   Prior to Admission medications   Medication Sig Start Date End Date Taking? Authorizing Provider  albuterol (PROVENTIL HFA;VENTOLIN HFA) 108 (90 Base) MCG/ACT inhaler Inhale 1-2 puffs into the lungs every 4 (four) hours as needed for wheezing or shortness of breath. 05/16/18  Yes [name], MD  amLODipine (NORVASC) 2.5 MG tablet Take 1 tablet (2.5 mg total) by mouth daily. 07/03/18  Yes Troy Sine, MD  aspirin EC 81 MG tablet Take 1 tablet (81 mg total) by mouth daily. 02/27/18  Yes Troy Sine, MD  atorvastatin (LIPITOR) 80 MG tablet Take 0.5 tablets (40 mg total) by mouth daily at 6 PM. 07/12/18  Yes Troy Sine, MD  cetirizine (ZYRTEC) 10 MG tablet Take 10 mg by mouth daily as needed for allergies.    Yes [provider]  FLUoxetine  (PROZAC) 20 MG tablet TAKE 2 TABLETS BY MOUTH EVERY DAY 01/07/19  Yes [name], MD  fluticasone [facility]) 50 MCG/ACT nasal spray Place 2 sprays into both nostrils daily. 08/11/18  Yes [name], MD  metoprolol succinate (TOPROL XL) 25 MG 24 hr tablet Take 0.5 tablets (12.5 mg total) by mouth daily. 03/10/18  Yes Dunn, Dayna N, PA-C  nitroGLYCERIN (NITROSTAT) 0.4 MG SL tablet PLACE 1 TABLET (0.4 MG TOTAL) UNDER THE TONGUE EVERY 5 (FIVE) MINUTES AS NEEDED FOR CHEST PAIN. 06/26/18  Yes [provider]  Probiotic Product (ALIGN)  4 MG CAPS Take 1 capsule by mouth daily.   Yes [provider]  ticagrelor (BRILINTA) 90 MG TABS tablet Take 1 tablet (90 mg total) by mouth 2 (two) times daily. 03/08/18  Yes Kroeger, Daleen Snook M., PA-C  traZODone (DESYREL) 50 MG tablet TAKE 1/2 TO 1 TABLET BY MOUTH AT BEDTIME AS NEEDED FOR SLEEP 12/18/18  Yes [name], MD  montelukast (SINGULAIR) 10 MG tablet Take 1 tablet (10 mg total) by mouth at bedtime. Patient not taking: Reported on 02/23/2019 08/11/18   [name], MD   Social History   Socioeconomic History   Marital status: Divorced    Spouse name: Not on file   Number of children: Not on file   Years of education: Not on file   Highest education level: Bachelor's degree (e.g., BA, AB, BS)  Occupational History   Not on file  Social Needs   Financial resource strain: Not on file   Food insecurity    Worry: Not on file    Inability: Not on file   Transportation needs    Medical: Not on file    Non-medical: Not on file  Tobacco Use   Smoking status: Never Smoker   Smokeless tobacco: Never Used  Substance and Sexual Activity   Alcohol use: Yes    Comment: 03/07/2018 "probably once/month"   Drug use: No   Sexual activity: Not on file  Lifestyle   Physical activity    Days per week: Not on file    Minutes per session: Not on file   Stress: Not on file  Relationships   Social connections     Talks on phone: Not on file    Gets together: Not on file    Attends religious service: Not on file    Active member of club or organization: Not on file    Attends meetings of clubs or organizations: Not on file    Relationship status: Not on file   Intimate partner violence    Fear of current or ex partner: Not on file    Emotionally abused: Not on file    Physically abused: Not on file    Forced sexual activity: Not on file  Other Topics Concern   Not on file  Social History Narrative   Lives at home alone   Right handed   Caffeine: 1/2 cup of coffee daily that has (1/2 & 1/2 caf)    Review of Systems Per HPI.     Objective:   Physical Exam Constitutional:      General: She is not in acute distress.    Appearance: She is well-developed.  HENT:     Head: Normocephalic and atraumatic.  Cardiovascular:     Rate and Rhythm: Normal rate.  Pulmonary:     Effort: Pulmonary effort is normal.  Neurological:     Mental Status: She is alert and oriented to person, place, and time.  Psychiatric:        Attention and Perception: Attention normal.        Mood and Affect: Mood is anxious. Affect is tearful (at times during visit. ).        Speech: Speech normal.        Behavior: Behavior normal.        Thought Content: Thought content normal.    Vitals:   02/23/19 1718  BP: 126/70  Pulse: 69  Resp: 18  Temp: 98.2 F (36.8 C)  SpO2: 99%  Weight: 136  lb 12.8 oz (62.1 kg)   30 minutes of face-to-face care with greater than 50% counseling.    Assessment & Plan:    ZEA KOSTKA is a 68 y.o. female Depression with anxiety  Anxiety state - Plan: FLUoxetine 60 MG TABS  Insomnia, unspecified type - Plan: traZODone (DESYREL) 50 MG tablet  Situational stress  Depression with anxiety, significant situational anxiety and workplace anxiety appears to be the main trigger at this time.  Did have some other social stressors that are contributing as well as current pandemic  likely impacting stress.  We discussed her workplace stressors and fit in the past.  Unfortunately has not been meeting with therapist recently.  -Restart therapy, advised to call therapist as soon as possible.  Asked that she discuss workplace issues with therapist to come up with a plan on moving forward.  -Stress management discussed with walking or other form of exercise most days per week.  Handout given on other coping techniques/stress management  -We will try higher dose of fluoxetine at 60 mg daily.  Decided to continue at trazodone 25 mg nightly with serotonin syndrome risk discussed and potential symptoms.  Less likely at that low dose of trazodone.  -Recheck in the next 1 month.  Consider psychiatry referral if persistent difficulty to evaluate medication options.   -911/ER precautions if any suicidal ideation.  Understanding expressed.   Meds ordered this encounter  Medications   FLUoxetine 60 MG TABS    Sig: Take 60 mg by mouth daily.    Dispense:  30 tablet    Refill:  1   traZODone (DESYREL) 50 MG tablet    Sig: Take 0.5 tablets (25 mg total) by mouth at bedtime as needed. for sleep    Dispense:  30 tablet    Refill:  2   Patient Instructions   Call your therapist/counselor appointment as soon as possible to discuss stressors including workplace concerns. I do recommend walking nurse if not all days per week as it has been helpful for your stress.  See other stress management techniques below.  Walking may need to be done early in the morning before work.  Okay to continue half dose of trazodone at bedtime, increase fluoxetine to 60 mg daily.  Recheck in the next 4 weeks.  If still having some difficulty, would recommend meeting with psychiatry to review medications and different options. Return to the clinic or go to the nearest emergency room if any of your symptoms worsen or new symptoms occur.   Stress Stress is a normal reaction to life events. Stress is what you  feel when life demands more than you are used to, or more than you think you can handle. Some stress can be useful, such as studying for a test or meeting a deadline at work. Stress that occurs too often or for too long can cause problems. It can affect your emotional health and interfere with relationships and normal daily activities. Too much stress can weaken your body's defense system (immune system) and increase your risk for physical illness. If you already have a medical problem, stress can make it worse. What are the causes? All sorts of life events can cause stress. An event that causes stress for one person may not be stressful for another person. Major life events, whether positive or negative, commonly cause stress. Examples include:  Losing a job or starting a new job.  Losing a loved one.  Moving to a new town or home.  Getting married or divorced.  Having a baby.  Injury or illness. Less obvious life events can also cause stress, especially if they occur day after day or in combination with each other. Examples include:  Working long hours.  Driving in traffic.  Caring for children.  Being in debt.  Being in a difficult relationship. What are the signs or symptoms? Stress can cause emotional symptoms, including:  Anxiety. This is feeling worried, afraid, on edge, overwhelmed, or out of control.  Anger, including irritation or impatience.  Depression. This is feeling sad, down, helpless, or guilty.  Trouble focusing, remembering, or making decisions. Stress can cause physical symptoms, including:  Aches and pains. These may affect your head, neck, back, stomach, or other areas of your body.  Tight muscles or a clenched jaw.  Low energy.  Trouble sleeping. Stress can cause unhealthy behaviors, including:  Eating to feel better (overeating) or skipping meals.  Working too much or putting off tasks.  Smoking, drinking alcohol, or using drugs to feel  better. How is this diagnosed? Stress is diagnosed through an assessment by your health care provider. He or she may diagnose this condition based on:  Your symptoms and any stressful life events.  Your medical history.  Tests to rule out other causes of your symptoms. Depending on your condition, your health care provider may refer you to a specialist for further evaluation. How is this treated?  Stress management techniques are the recommended treatment for stress. Medicine is not typically recommended for the treatment of stress. Techniques to reduce your reaction to stressful life events include:  Stress identification. Monitor yourself for symptoms of stress and identify what causes stress for you. These skills may help you to avoid or prepare for stressful events.  Time management. Set your priorities, keep a calendar of events, and learn to say no. Taking these actions can help you avoid making too many commitments. Techniques for coping with stress include:  Rethinking the problem. Try to think realistically about stressful events rather than ignoring them or overreacting. Try to find the positives in a stressful situation rather than focusing on the negatives.  Exercise. Physical exercise can release both physical and emotional tension. The key is to find a form of exercise that you enjoy and do it regularly.  Relaxation techniques. These relax the body and mind. The key is to find one or more that you enjoy and use the technique(s) regularly. Examples include: ? Meditation, deep breathing, or progressive relaxation techniques. ? Yoga or tai chi. ? Biofeedback, mindfulness techniques, or journaling. ? Listening to music, being out in nature, or participating in other hobbies.  Practicing a healthy lifestyle. Eat a balanced diet, drink plenty of water, limit or avoid caffeine, and get plenty of sleep.  Having a strong support network. Spend time with family, friends, or  other people you enjoy being around. Express your feelings and talk things over with someone you trust. Counseling or talk therapy with a mental health professional may be helpful if you are having trouble managing stress on your own. Follow these instructions at home: Lifestyle   Avoid drugs.  Do not use any products that contain nicotine or tobacco, such as cigarettes and e-cigarettes. If you need help quitting, ask your health care provider.  Limit alcohol intake to no more than 1 drink a day for nonpregnant women and 2 drinks a day for men. One drink equals 12 oz of beer, 5 oz of wine, or 1 oz of  hard liquor.  Do not use alcohol or drugs to relax.  Eat a balanced diet that includes fresh fruits and vegetables, whole grains, lean meats, fish, eggs, and beans, and low-fat dairy. Avoid processed foods and foods high in added fat, sugar, and salt.  Exercise at least 30 minutes on 5 or more days each week.  Get 7-8 hours of sleep each night. General instructions   Practice stress management techniques as discussed with your health care provider.  Drink enough fluid to keep your urine clear or pale yellow.  Take over-the-counter and prescription medicines only as told by your health care provider.  Keep all follow-up visits as told by your health care provider. This is important. Contact a health care provider if:  Your symptoms get worse.  You have new symptoms.  You feel overwhelmed by your problems and can no longer manage them on your own. Get help right away if:  You have thoughts of hurting yourself or others. If you ever feel like you may hurt yourself or others, or have thoughts about taking your own life, get help right away. You can go to your nearest emergency department or call:  Your local emergency services (911 in the [place]  A suicide crisis helpline, such as the [place] at 734-424-8343. This is open 24 hours a  day. Summary  Stress is a normal reaction to life events. It can cause problems if it happens too often or for too long.  Practicing stress management techniques is the best way to treat stress.  Counseling or talk therapy with a mental health professional may be helpful if you are having trouble managing stress on your own. This information is not intended to replace advice given to you by your health care provider. Make sure you discuss any questions you have with your health care provider. Document Released: 01/26/2001 Document Revised: 07/15/2017 Document Reviewed: 09/22/2016 Elsevier Patient Education  [employer].    If you have lab work done today you will be contacted with your lab results within the next 2 weeks.  If you have not heard from [place] then please contact us. The fastest way to get your results is to register for My Chart.   IF you received an x-ray today, you will receive an invoice from [facility]. Please contact [facility] at [phone] with questions or concerns regarding your invoice.   IF you received labwork today, you will receive an invoice from [facility]. Please contact [facility] at [phone] with questions or concerns regarding your invoice.   Our billing staff will not be able to assist you with questions regarding bills from these companies.  You will be contacted with the lab results as soon as they are available. The fastest way to get your results is to activate your My Chart account. Instructions are located on the last page of this paperwork. If you have not heard from [place] regarding the results in 2 weeks, please contact this office.       Signed,   [name], MD [facility].  02/23/19 6:24 PM

## 2019-02-27 MED ORDER — TICAGRELOR 90 MG PO TABS: 90.0000 mg | ORAL_TABLET | Freq: Two times a day (BID) | ORAL | 0 refills | Status: DC

## 2019-03-11 ENCOUNTER — Other Ambulatory Visit: Payer: Self-pay | Admitting: Physician Assistant

## 2019-03-23 ENCOUNTER — Ambulatory Visit: Payer: Medicare Other | Admitting: Family Medicine

## 2019-03-24 ENCOUNTER — Other Ambulatory Visit: Payer: Self-pay | Admitting: Family Medicine

## 2019-03-24 DIAGNOSIS — F411 Generalized anxiety disorder: Secondary | ICD-10-CM

## 2019-03-24 NOTE — Telephone Encounter (Signed)
Requested medication (s) are due for refill today: yea  Requested medication (s) are on the active medication list: yes  Last refill:  01/07/19  Future visit scheduled: no  Notes to clinic:  Current rx expired 02/23/19   Requested Prescriptions  Pending Prescriptions Disp Refills   FLUoxetine (PROZAC) 20 MG tablet [Pharmacy Med Name: FLUOXETINE HCL 20 MG TABLET] 60 tablet 1    Sig: TAKE 2 [place]     Psychiatry:  Antidepressants - SSRI Passed - 03/24/2019 10:24 AM      Passed - Valid encounter within last 6 months    Recent Outpatient Visits          4 weeks ago Depression with anxiety   [facility], Ranell Patrick, MD   7 months ago Allergic sinusitis   [facility], Laurey Arrow, MD   9 months ago Adjustment disorder with mixed anxiety and depressed mood   [facility], Ranell Patrick, MD   10 months ago Anxiety state   [facility], Ranell Patrick, MD   11 months ago Anxiety state   [facility], MD             Passed - Completed PHQ-2 or PHQ-9 in the last 360 days.

## 2019-03-26 ENCOUNTER — Other Ambulatory Visit: Payer: Self-pay | Admitting: *Deleted

## 2019-03-26 MED ORDER — NITROGLYCERIN 0.4 MG SL SUBL: 0.4000 mg | SUBLINGUAL_TABLET | SUBLINGUAL | 0 refills | Status: DC | PRN

## 2019-04-02 ENCOUNTER — Other Ambulatory Visit: Payer: Self-pay

## 2019-04-02 MED ORDER — ATORVASTATIN CALCIUM 80 MG PO TABS: 40.0000 mg | ORAL_TABLET | Freq: Every day | ORAL | 3 refills | Status: DC

## 2019-04-16 ENCOUNTER — Other Ambulatory Visit: Payer: Self-pay

## 2019-04-16 ENCOUNTER — Telehealth: Payer: Self-pay | Admitting: Cardiovascular Disease

## 2019-04-16 ENCOUNTER — Encounter: Payer: Self-pay | Admitting: Cardiovascular Disease

## 2019-04-16 ENCOUNTER — Ambulatory Visit: Payer: PRIVATE HEALTH INSURANCE | Admitting: Cardiovascular Disease

## 2019-04-16 DIAGNOSIS — F419 Anxiety disorder, unspecified: Secondary | ICD-10-CM

## 2019-04-16 DIAGNOSIS — I10 Essential (primary) hypertension: Secondary | ICD-10-CM

## 2019-04-16 DIAGNOSIS — Z79899 Other long term (current) drug therapy: Secondary | ICD-10-CM

## 2019-04-16 DIAGNOSIS — I2510 Atherosclerotic heart disease of native coronary artery without angina pectoris: Secondary | ICD-10-CM

## 2019-04-16 DIAGNOSIS — R6884 Jaw pain: Secondary | ICD-10-CM | POA: Diagnosis not present

## 2019-04-16 DIAGNOSIS — E785 Hyperlipidemia, unspecified: Secondary | ICD-10-CM

## 2019-04-16 MED ORDER — PANTOPRAZOLE SODIUM 40 MG PO TBEC: 40.0000 mg | DELAYED_RELEASE_TABLET | Freq: Every day | ORAL | 1 refills | Status: DC

## 2019-04-16 MED ORDER — AMLODIPINE BESYLATE 5 MG PO TABS: 5.0000 mg | ORAL_TABLET | Freq: Every day | ORAL | 1 refills | Status: DC

## 2019-04-16 NOTE — Progress Notes (Signed)
Cardiology Office Note    Date:  04/18/2019   ID:  [name], DOB 1950-12-05, MRN [id number]  PCP:  [name], MD  Cardiologist:  Shelva Majestic, MD   No chief complaint on file.  Add on Cardiology evaluation  History of Present Illness:  [name] is a 68 y.o. female who is seen as an add-on today after she had called the office complaining of jaw and back discomfort intermittently for the past week.    Ms [name] is a 68 year old divorced female who was told of having a possible heart murmur as a child and also had childhood asthma.  He has a history of breast CA and in 2012 underwent double mastectomy.  She subsequently underwent gel implants.  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  Her new job has created increased work-related stress.  Recently, she has noticed a light pain sensation in her jaw.  She has noticed some mild chest tightness extending to her back which seems to be more stress mediated.  It also seems to be related to some increased anxiety.  She denies any chest pain with exercise.  She admits to recent increased belching which seems to improve some of her jaw discomfort.  She was told of having a hiatal hernia.  She does not routinely exercise but she walks the hallways at work.  He has had some issues with bilateral lower extremity swelling which was felt to be multifactorial.  She saw Dr. Servando Snare and underwent LE doppler imaging.  Is no evidence for DVT.  He did have abnormal reflux times in the common femoral vein.  Some chronic venous insufficiency in the deep venous system of the left.  There was no need for intervention but compression stockings were recommended.  Because of recurrent chest and jaw symptomatology, she presented for an initial evaluation with me on Dec 15, 2017.  I scheduled her to undergo a CT coronary angio.  The CT scan was done on 12/28/2017 and demonstrated a calcium score of  6.  There was mild calcium noted in the left main and mid LAD.  The left main had less than 20% calcific stenosis, the LAD had 50 to 75% mid LAD stenosis with mixed plaque.  There was a normal circumflex and RCA.  FFR was positive in the LAD territory at 0.5.  Chest CT also showed nonspecific 2 mm left upper lobe nodule which was felt most likely to be benign.  Follow-up was needed if the patient is low risk but if patient was high risk noncontrast chest CT could be considered in 12 months.  When I received notification of her CT scan results, we contacted the patient, initiated aspirin 81 mg, metoprolol 12.5 mg twice a day and gave her a prescription for sublingual nitroglycerin.  She had experienced exertional chest pain particularly with uphill walking.  I recommended she undergo cardiac catheterization which I performed on March 07, 2018.  She was found to have single-vessel CAD with 95% fibrotic appearing mid LAD stenosis distal to a proximal septal diagonal vessel and underwent successful stent PCI with Cutting Balloon and DES stenting with insertion of Resolute Onyx 2.5 x 18 mm stent with no suspect reduced to 0.  Subsequently, he was readmitted to the hospital several days later with recurrent chest pain, palpitations and hypertension.  Her chest pain was felt to be related to back and anxiety.  Over the last  several months she has continued to be stable from a cardiac standpoint.  She denies any recurrent chest pain.  She had taken 26 weeks off for medical leave from work due to some of the anxiety and she is now back at work.  She denies chest tightness.  At times she notes some mild dizziness.  She has had issues with constipation and was recently started on a probiotic by GI.  She has been participating in phase 2 of the cardiac rehab.    I last saw her in March 2020 and since her evaluation in  November 2019, she  continued to do well.  She specifically denied any exertional chest pain or  shortness  of breath.  She completed cardiac rehab August 14, 2018.  She was unaware of palpitations presyncope or syncope.   Laboratory showed significant benefit on atorvastatin 40 mg whether total cholesterol 114, LDL cholesterol 49, HDL 47 and triglycerides 88.  She was on low-dose Toprol-XL 12.5 mg and amlodipine 2.5 mg daily for her blood pressure and dual antiplatelet therapy with aspirin and Brilinta.    She has continued to work and admits to recent increased stress.  Over the past week, she is developed left-sided jaw pain and also some discomfort in her mid upper back.  She denies any definitive exertional chest discomfort.  However, she was concerned perhaps that this was cardiac in etiology.  When specifically asked she denies any exertional chest tightness.  She did not feel that her back discomfort was due to muscular tension.  She has been utilizing her upper arms significantly at work.  She called the office today.  It was felt that she needed to be seen and obtain an ECG and she presents as an add-on for my evaluation.  Past Medical History:  Diagnosis Date  . Allergy    "dust mite residue, mold, mildew, grapefruit, chili peppers, cat dander, some weeds" (03/07/2018)  . Anxiety   . Arthritis    "minor; right knee, left hip" (03/07/2018)  . Bruises easily   . Cancer of left breast ([facility])    double mastectomy 01/26/2011  . Childhood asthma   . Contact lens/glasses fitting   . Coronary artery disease    a.  She underwent LHC 7/23 which revealed 95% stenosis of proximal LAD, managed with PCI/DES.   . Depression   . Essential hypertension   . GERD (gastroesophageal reflux disease)    "at times" (03/07/2018)  . Heart murmur   . Hiatal hernia   . High cholesterol   . History of kidney stones    "lots; no OR" (03/07/2018)  . Osteoporosis   . Rectal bleeding    hemorroid  . Ringing in ears, bilateral    "since ~ 06/2017" (03/07/2018)  . Sinus drainage     Past Surgical History:   Procedure Laterality Date  . APPENDECTOMY    . BREAST LUMPECTOMY W/ NEEDLE LOCALIZATION Left 10/05/2010   lumpectomy  . BREAST SURGERY    . CORONARY STENT INTERVENTION N/A 03/07/2018   Procedure: CORONARY STENT INTERVENTION;  Surgeon: Troy Sine, MD;  Location: [facility];  Service: Cardiovascular;  Laterality: N/A;  . LEFT HEART CATH AND CORONARY ANGIOGRAPHY N/A 03/07/2018   Procedure: LEFT HEART CATH AND CORONARY ANGIOGRAPHY;  Surgeon: Troy Sine, MD;  Location: [facility];  Service: Cardiovascular;  Laterality: N/A;  . MASTECTOMY Right 01/26/2011  . MASTECTOMY COMPLETE / SIMPLE W/ SENTINEL NODE BIOPSY Left 01/26/2011  .  OVARIAN CYST SURGERY  1997, 2001, 2008    Current Medications: Outpatient Medications Prior to Visit  Medication Sig Dispense Refill  . albuterol (PROVENTIL HFA;VENTOLIN HFA) 108 (90 Base) MCG/ACT inhaler Inhale 1-2 puffs into the lungs every 4 (four) hours as needed for wheezing or shortness of breath. 1 Inhaler 0  . aspirin EC 81 MG tablet Take 1 tablet (81 mg total) by mouth daily. 90 tablet 3  . atorvastatin (LIPITOR) 80 MG tablet Take 0.5 tablets (40 mg total) by mouth daily at 6 PM. 90 tablet 3  . cetirizine (ZYRTEC) 10 MG tablet Take 10 mg by mouth daily as needed for allergies.     Marland Kitchen FLUoxetine (PROZAC) 20 MG tablet TAKE 2 TABLETS BY MOUTH EVERY DAY 60 tablet 1  . fluticasone (FLONASE) 50 MCG/ACT nasal spray Place 2 sprays into both nostrils daily. (Patient taking differently: Place 2 sprays into both nostrils as needed. ) 16 g 6  . metoprolol succinate (TOPROL-XL) 25 MG 24 hr tablet TAKE 1/2 TABLET BY MOUTH EVERY DAY 15 tablet 11  . nitroGLYCERIN (NITROSTAT) 0.4 MG SL tablet Place 1 tablet (0.4 mg total) under the tongue every 5 (five) minutes as needed for chest pain. 75 tablet 0  . Probiotic Product (ALIGN) 4 MG CAPS Take 1 capsule by mouth daily.    . ticagrelor (BRILINTA) 90 MG TABS tablet Take 1 tablet (90 mg total) by mouth 2 (two)  times daily. 180 tablet 0  . traZODone (DESYREL) 50 MG tablet Take 0.5 tablets (25 mg total) by mouth at bedtime as needed. for sleep 30 tablet 2  . amLODipine (NORVASC) 2.5 MG tablet Take 1 tablet (2.5 mg total) by mouth daily. 90 tablet 3  . FLUoxetine 60 MG TABS Take 60 mg by mouth daily. 30 tablet 1  . montelukast (SINGULAIR) 10 MG tablet Take 1 tablet (10 mg total) by mouth at bedtime. (Patient not taking: Reported on 02/23/2019) 30 tablet 3   No facility-administered medications prior to visit.      Allergies:   Elavil [amitriptyline hcl] and Sulfur   Social History   Socioeconomic History  . Marital status: Divorced    Spouse name: Not on file  . Number of children: Not on file  . Years of education: Not on file  . Highest education level: Bachelor's degree (e.g., BA, AB, BS)  Occupational History  . Not on file  Social Needs  . Financial resource strain: Not on file  . Food insecurity    Worry: Not on file    Inability: Not on file  . Transportation needs    Medical: Not on file    Non-medical: Not on file  Tobacco Use  . Smoking status: Never Smoker  . Smokeless tobacco: Never Used  Substance and Sexual Activity  . Alcohol use: Yes    Comment: 03/07/2018 "probably once/month"  . Drug use: No  . Sexual activity: Not on file  Lifestyle  . Physical activity    Days per week: Not on file    Minutes per session: Not on file  . Stress: Not on file  Relationships  . Social Herbalist on phone: Not on file    Gets together: Not on file    Attends religious service: Not on file    Active member of club or organization: Not on file    Attends meetings of clubs or organizations: Not on file    Relationship status: Not on file  Other Topics  Concern  . Not on file  Social History Narrative   Lives at home alone   Right handed   Caffeine: 1/2 cup of coffee daily that has (1/2 & 1/2 caf)    Social history is notable that she is divorced from her second  marriage.  She was married initially at a young age and then divorced.  She was single for 20 years.  Her second marriage was of 6 years duration.  She is divorced for 4 years.  She graduated from [employer] the [employer] in music.  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  Family History:  The patient's family history includes Cancer in her brother and sister; Diabetes in her sister; Heart Problems in her mother; Heart failure in her paternal grandfather and paternal grandmother; Hyperlipidemia in her mother; Hypertension in her mother; Hypotension in her father; Other in her father.  Parents lived to an old age with her mother dying at 4 and her father dying at 57.  One brother died at age 17 with cancer and had diabetes.  One living brother and one living sister.  ROS General: Negative; No fevers, chills, or night sweats;  HEENT: Negative; No changes in vision or hearing, sinus congestion, difficulty swallowing Pulmonary: Negative; No cough, wheezing, shortness of breath, hemoptysis Cardiovascular: Negative; No chest pain, presyncope, syncope, palpitations GI: Positive for hiatal hernia GU: Negative; No dysuria, hematuria, or difficulty voiding Musculoskeletal: Negative; no myalgias, joint pain, or weakness Hematologic/Oncology:   estrogen receptor positive breast cancer Endocrine: Negative; no heat/cold intolerance; no diabetes Neuro: Negative; no changes in balance, headaches Skin: Negative; No rashes or skin lesions Psychiatric: Negative; No behavioral problems, depression Sleep: Negative; No snoring, daytime sleepiness, hypersomnolence, bruxism, restless legs, hypnogognic hallucinations, no cataplexy Other comprehensive 14 point system review is negative.   PHYSICAL EXAM:   VS:  BP 120/60   Pulse (!) 59   Temp (!) 96.3 F (35.7 C)   Ht 5' 2"  (1.575 m)   Wt 140 lb 3.2 oz (63.6  kg)   LMP 09/10/2006   BMI 25.64 kg/m     Repeat blood pressure by me was 122/62  Wt Readings from Last 3 Encounters:  04/16/19 140 lb 3.2 oz (63.6 kg)  02/23/19 136 lb 12.8 oz (62.1 kg)  10/16/18 141 lb (64 kg)    General: Alert, oriented, no distress.  Skin: normal turgor, no rashes, warm and dry HEENT: Normocephalic, atraumatic. Pupils equal round and reactive to light; sclera anicteric; extraocular muscles intact;  Nose without nasal septal hypertrophy Mouth/Parynx benign; Mallinpatti scale 3 Neck: No JVD, no carotid bruits; normal carotid upstroke Lungs: clear to ausculatation and percussion; no wheezing or rales Chest wall: No costochondral tenderness Heart: PMI not displaced, RRR, s1 s2 normal, 1/6 systolic murmur, no diastolic murmur, no rubs, gallops, thrills, or heaves Abdomen: soft, nontender; no hepatosplenomehaly, BS+; abdominal aorta nontender and not dilated by palpation. Back: no CVA tenderness Pulses 2+ Musculoskeletal: full range of motion, normal strength, no joint deformities Extremities: no clubbing cyanosis or edema, Homan's sign negative  Neurologic: grossly nonfocal; Cranial nerves grossly wnl Psychologic: Normal mood and affect   Studies/Labs Reviewed:   EKG:  EKG is ordered today.  ECG (independently read by me): Sinus bradycardia 59 bpm.  No ectopy.  No ST segment changes.  May 2020 ECG (independently read by me): Sinus bradycardia 59  bpm.  QS complex V1 V2.  Normal intervals.  No ectopy.  November 2019 ECG (independently read by me): Sinus rhythm at 60 bpm.  No ectopy.  Normal intervals  July 2019 ECG (independently read by me): Sinus bradycardia 52 bpm.  No ST segment changes  Dec 15, 2017 ECG (independently read by me): Sinus rhythm at 66 bpm.  No ectopy.  Normal intervals.  No ST segment changes.  Recent Labs: BMP Latest Ref Rng & Units 07/06/2018 04/14/2018 03/10/2018  Glucose 65 - 99 mg/dL 90 89 94  BUN 8 - 27 mg/dL 10 16 14   Creatinine 0.57  - 1.00 mg/dL 0.90 0.81 0.87  BUN/Creat Ratio 12 - 28 11(L) 20 -  Sodium 134 - 144 mmol/L 139 145(H) 141  Potassium 3.5 - 5.2 mmol/L 4.7 3.9 4.1  Chloride 96 - 106 mmol/L 101 105 104  CO2 20 - 29 mmol/L 23 24 26   Calcium 8.7 - 10.3 mg/dL 9.7 9.5 9.6     Hepatic Function Latest Ref Rng & Units 09/26/2018 07/06/2018 09/24/2017  Total Protein 6.0 - 8.5 g/dL 6.8 6.6 7.0  Albumin 3.8 - 4.8 g/dL 4.6 4.6 4.7  AST 0 - 40 IU/L 26 40 18  ALT 0 - 32 IU/L 29 55(H) 16  Alk Phosphatase 39 - 117 IU/L 159(H) 151(H) 120(H)  Total Bilirubin 0.0 - 1.2 mg/dL 1.0 1.7(H) 0.7  Bilirubin, Direct 0.00 - 0.40 mg/dL 0.24 - -    CBC Latest Ref Rng & Units 07/06/2018 04/14/2018 03/10/2018  WBC 3.4 - 10.8 x10E3/uL 5.9 8.5 7.4  Hemoglobin 11.1 - 15.9 g/dL 12.9 12.5 13.3  Hematocrit 34.0 - 46.6 % 38.4 39.0 40.3  Platelets 150 - 450 x10E3/uL 247 - 239   Lab Results  Component Value Date   MCV 93 07/06/2018   MCV 92.6 04/14/2018   MCV 95.7 03/10/2018   Lab Results  Component Value Date   TSH 1.410 12/27/2016   Lab Results  Component Value Date   HGBA1C 5.7 (H) 11/23/2018     BNP No results found for: BNP  ProBNP No results found for: PROBNP   Lipid Panel     Component Value Date/Time   CHOL 114 09/26/2018 1005   TRIG 88 09/26/2018 1005   HDL 47 09/26/2018 1005   CHOLHDL 2.4 09/26/2018 1005   CHOLHDL 3.9 01/17/2016 0937   VLDL 37 (H) 01/17/2016 0937   LDLCALC 49 09/26/2018 1005     RADIOLOGY: No results found.   Additional studies/ records that were reviewed today include:  Reviewed records from Dr. Cindee Lame.  I also reviewed her vascular surgery evaluation by Dr. Donzetta Matters. Recent laboratory from September 26, 2018 was reviewed  ------------------------------------------------------------------- Jan 02, 2018  ECHO study Conclusions  - Left ventricle: The cavity size was normal. Wall thickness was   normal. Systolic function was normal. The estimated ejection   fraction was in the  range of 55% to 60%. Wall motion was normal;   there were no regional wall motion abnormalities. Features are   consistent with a pseudonormal left ventricular filling pattern,   with concomitant abnormal relaxation and increased filling   pressure (grade 2 diastolic dysfunction). - Aortic valve: Valve mobility was restricted. There was mild   regurgitation.  Impressions:  - Normal LV systolic function; moderate diastolic dysfunction;   sclerotic aortic valve with mild AI.    CT Coronary Angio  IMPRESSION: 1.  Aortic root normal 3.3 cm  2.  Calcium score 6 which is  49 th percentile for age and sex  32.  50-75% mid LAD stenosis study will be sent for FFR CT  FFR: IMPRESSION: Hemodynamically significant mid LAD stenosis with FFR CT .50  ------------------------------------------------------------------------------------------------------- 03/07/2018: Cath/PCI Single-vessel coronary obstructive disease with a 95% fibrotic appearing mid LAD stenosis distal to a proximal septal and diagonal vessel.  Normal left circumflex and dominant RCA.  LVEDP 21 mm.  Successful PCI to the mid LAD utilizing Cutting Balloon and DES stenting with a 2.5 x 18 mm Resolute Onyx stent with the 95% stenosis being reduced to 0% evidence for brisk TIMI-3 flow with no evidence for dissection at the completion of the procedure.  RECOMMENDATION:  Recommend uninterrupted dual antiplatelet therapy with Aspirin 76m daily and Ticagrelor 931mtwice daily for a minimum of 12 months (ACS - Class I recommendation).  The patient will continue with low-dose beta-blocker therapy.  Aggressive lipid-lowering therapy with high potency statin.    ASSESSMENT:    1. CAD in native artery: s/p PCI with DES stent to LAD 03/07/18   2. Jaw pain, non-TMJ   3. Essential hypertension   4. Hyperlipidemia with target LDL less than 70   5. Anxiety   6. Medication management     PLAN:  Ms WaRia Redcays a very  pleasant [age]ear old female  had experienced episodes of jaw discomfort which seemed to improve with belching and also  noticed some vague chest fullness and at times has noted this in her back in 2019.  These occurred non-exertionally but in retrospect she admitted that perhaps when she had walked uphill she noticed more jaw discomfort and belching.  When I initially saw her she was noticing an increase in work-related stress which seemed to play a role in symptom development. A 2D echo Doppler study on Jan 02, 2018 revealed normal systolic function with an EF of 55 to 60% and grade 2 diastolic dysfunction.  There was mild aortic sclerosis with mild aortic insufficiency.  Although her cardiac calcium score was 6, she was found to have 95% fibrotic appearing mid LAD stenosis for which she underwent successful intervention.  Her prior chest tightness has improved.  She has had issues with anxiety and required medical leave from work for approximately 6 weeks.  She completed cardiac rehab.  I last saw her her blood pressure was low and I reduced her amlodipine to 2.5 mg.  She continues to take Toprol-XL 12.5 mg.  Her ECG remained stable with sinus bradycardia at 59 bpm.  For the past week, she again admits to significant work-related stress and again has noted some development of left-sided jaw discomfort that is nonexertional.  She also has noticed some vague mid thoracic back discomfort.  However when asked about any exertional component or shortness of breath with walking up a hill she denied any chest pain or symptomatology.  Her ECG today remained stable.  Her blood pressure today on repeat by me was 126/62.  I have recommended increasing amlodipine back to 5 mg.  This should be helpful both for potential ischemic etiology as well as esophageal spasm.  I am also empirically starting her on Protonix 40 mg.  She also has been taking Fosamax.  She continues to be on metoprolol succinate 12.5 mg.  She continues to be  on dual antiplatelet therapy with aspirin Brilinta.  She is on Prozac 20 mg for anxiety/depression.  I will recheck chemistry profile lipid studies hemoglobin A1c, TSH and CBC.  If she continues to experience  recurrent symptomatology she will contact that office.  Otherwise I will see her in 3 months for reevaluation.   Time spent: 30 minutes  Medication Adjustments/Labs and Tests Ordered: Current medicines are reviewed at length with the patient today.  Concerns regarding medicines are outlined above.  Medication changes, Labs and Tests ordered today are listed in the Patient Instructions below. Patient Instructions  Medication Instructions:  Increase Amlodipine 5 mg daily. Start Protonix 40 mg daily.  If you need a refill on your cardiac medications before your next appointment, please call your pharmacy.   Lab work: Fasting lab work (CBC, CMET, TSH, LIPID, A1C)  Attached are the lab orders that are needed before your upcoming appointment, please come in 3 months anytime to have your labs drawn.   They are fasting labs, so nothing to eat or drink after midnight.  Lab hours: 8:00-4:00 lunch hours 12:45-1:45   If you have labs (blood work) drawn today and your tests are completely normal, you will receive your results only by: Marland Kitchen MyChart Message (if you have MyChart) OR . A paper copy in the mail If you have any lab test that is abnormal or we need to change your treatment, we will call you to review the results.  Follow-Up: At [facility], you and your health needs are our priority.  As part of our continuing mission to provide you with exceptional heart care, we have created designated Provider Care Teams.  These Care Teams include your primary Cardiologist (physician) and Advanced Practice Providers (APPs -  Physician Assistants and Nurse Practitioners) who all work together to provide you with the care you need, when you need it. You will need a follow up appointment in 3 months.   Please call our office 2 months in advance to schedule this appointment.  You may see Shelva Majestic, MD or one of the following Advanced Practice Providers on your designated Care Team: [place], [place] . Fabian Sharp, PA-C        Signed, Shelva Majestic, MD  04/18/2019 [place] [address] [place], [place], [place]  [address] Phone: [phone]

## 2019-04-16 NOTE — Telephone Encounter (Signed)
New Message   Patient c/o Palpitations:  High priority if patient c/o lightheadedness, shortness of breath, or chest pain  1) How long have you had palpitations/irregular HR/ Afib? Are you having the symptoms now? A week  2) Are you currently experiencing lightheadedness, SOB or CP? SOB on exertion   3) Do you have a history of afib (atrial fibrillation) or irregular heart rhythm? Yes  4) Have you checked your BP or HR? (document readings if available): No  5) Are you experiencing any other symptoms? Headache, nausea, and weird feeling in the jaw

## 2019-04-16 NOTE — Patient Instructions (Signed)
Medication Instructions:  Increase Amlodipine 5 mg daily. Start Protonix 40 mg daily.  If you need a refill on your cardiac medications before your next appointment, please call your pharmacy.   Lab work: Fasting lab work (CBC, CMET, TSH, LIPID, A1C)  Attached are the lab orders that are needed before your upcoming appointment, please come in 3 months anytime to have your labs drawn.   They are fasting labs, so nothing to eat or drink after midnight.  Lab hours: 8:00-4:00 lunch hours 12:45-1:45   If you have labs (blood work) drawn today and your tests are completely normal, you will receive your results only by: Marland Kitchen MyChart Message (if you have MyChart) OR . A paper copy in the mail If you have any lab test that is abnormal or we need to change your treatment, we will call you to review the results.  Follow-Up: At [facility], you and your health needs are our priority.  As part of our continuing mission to provide you with exceptional heart care, we have created designated Provider Care Teams.  These Care Teams include your primary Cardiologist (physician) and Advanced Practice Providers (APPs -  Physician Assistants and Nurse Practitioners) who all work together to provide you with the care you need, when you need it. You will need a follow up appointment in 3 months.  Please call our office 2 months in advance to schedule this appointment.  You may see Shelva Majestic, MD or one of the following Advanced Practice Providers on your designated Care Team: [place], [place] . Fabian Sharp, PA-C

## 2019-04-16 NOTE — Telephone Encounter (Signed)
Received a call from patient.She stated she has been having tightness in jaw off and on for the past 1 week.Stated yesterday she had chest heaviness.Stated she just don't feel right.No chest tightness ay present.Symptoms similar to symptoms she had before heart stent last year.Spoke to [facility] he advised he would see patient.Appointment sheduled at 3:40 pm.Advised to got to ED if she starts having chest tightness.

## 2019-04-18 ENCOUNTER — Encounter: Payer: Self-pay | Admitting: Cardiovascular Disease

## 2019-05-09 ENCOUNTER — Other Ambulatory Visit: Payer: Self-pay | Admitting: Cardiovascular Disease

## 2019-05-20 ENCOUNTER — Other Ambulatory Visit: Payer: Self-pay | Admitting: Family Medicine

## 2019-05-20 ENCOUNTER — Other Ambulatory Visit: Payer: Self-pay | Admitting: Cardiovascular Disease

## 2019-05-20 DIAGNOSIS — F411 Generalized anxiety disorder: Secondary | ICD-10-CM

## 2019-05-25 ENCOUNTER — Ambulatory Visit (INDEPENDENT_AMBULATORY_CARE_PROVIDER_SITE_OTHER): Payer: PRIVATE HEALTH INSURANCE | Admitting: Family Medicine

## 2019-05-25 ENCOUNTER — Encounter: Payer: Self-pay | Admitting: Family Medicine

## 2019-05-25 ENCOUNTER — Other Ambulatory Visit: Payer: Self-pay

## 2019-05-25 VITALS — BP 117/66 | HR 60 | Temp 97.9°F | Ht 62.0 in | Wt 141.6 lb

## 2019-05-25 DIAGNOSIS — M791 Myalgia, unspecified site: Secondary | ICD-10-CM

## 2019-05-25 DIAGNOSIS — M654 Radial styloid tenosynovitis [de Quervain]: Secondary | ICD-10-CM

## 2019-05-25 DIAGNOSIS — M549 Dorsalgia, unspecified: Secondary | ICD-10-CM

## 2019-05-25 DIAGNOSIS — M79652 Pain in left thigh: Secondary | ICD-10-CM

## 2019-05-25 DIAGNOSIS — M2550 Pain in unspecified joint: Secondary | ICD-10-CM | POA: Diagnosis not present

## 2019-05-25 DIAGNOSIS — I2510 Atherosclerotic heart disease of native coronary artery without angina pectoris: Secondary | ICD-10-CM

## 2019-05-25 DIAGNOSIS — M25551 Pain in right hip: Secondary | ICD-10-CM

## 2019-05-25 DIAGNOSIS — M25552 Pain in left hip: Secondary | ICD-10-CM

## 2019-05-25 NOTE — Progress Notes (Signed)
Subjective:    Patient ID: [name], female    DOB: [birth year], 68 y.o.   MRN: [id number]  HPI [name] is a 68 y.o. female Presents today for: Chief Complaint  Patient presents with   pain all over     back pain, right arm, and left leg pain (pain ongoing/ worse over the last month)   Arthralgias: Diffuse as above.  No focal redness or focal swelling.  Still on lipitor 61m qd. Prior leg pains with higher dose have resolved.   Mid back.  Pulling sensation then painful at times.  Over 1 year.  Notices more after using arms  - lifting groceries made worse. Notices with prolonged typing.  Occasional tylenol and ice helps. Change in activity.  No new activity, no falls or known injury.   R arm pain: R hand dominant Noticed past month.  NKI.  Pain radiating form upper arm to forearm. "zinging pain" into hand at times. Pain in arm more notable after typing. Sore to carry pocketbook.  Getting worse.  Neck tight at times, sore in back of  Neck. chiropracter treatment x3 - last week most recent - no change in arm symptoms, but helps neck.  Tx; tylenol, ice.   Hip pain: Few weeks. NKI. No change in activity.  Both hips- catching sensation if sitting for awhile. Last few weeks. Feels better after walking it out, but some soreness during the day as well. Stiff after sitting for awhile.   L thigh: Pulling sensation behind left thigh.  Past few weeks. Both lower legs swell at end of day, wears compression stockings at times. Not daily. Swelling improves overnight.    Depression:  Depression screen [facility] 05/25/2019 02/23/2019 07/29/2018 05/30/2018 05/16/2018  Decreased Interest 0 1 0 1 1  Down, Depressed, Hopeless 0 1 0 1 1  PHQ - 2 Score 0 2 0 2 2  Altered sleeping - 0 - 3 2  Tired, decreased energy - 1 - 2 2  Change in appetite - 0 - 1 1  Feeling bad or failure about yourself  - 1 - 1 2  Trouble concentrating - 0 - 0 0  Moving slowly or fidgety/restless - 0 -  0 0  Suicidal thoughts - 0 - 0 0  PHQ-9 Score - 4 - 9 9  Difficult doing work/chores - Somewhat difficult - Somewhat difficult Somewhat difficult   History of depression with anxiety, situational anxiety and workplace anxiety with main triggers when discussed in July.  Some other social stressors as well as pandemic with stress at that time.  Recommended restarting therapy.  Recommended discussing her workplace issues with the therapist.  Stress management was discussed with walking or other forms of exercise most days per week and handout was given on coping techniques and stress management.  Fluoxetine was increased to 60 mg daily, continue trazodone 25 mg nightly and recommended follow-up in 1 month.  Did not increase fluoxetine - has remained on 4104mdose, trazodone at night has bee doing ok.   Did talk to therapist on the phone felt that was helpful.  Feels like coping better. n  Patient Active Problem List   Diagnosis Date Noted   Absolute anemia 06/13/2018   Fatigue associated with anemia 06/13/2018   Abnormal dreams 06/13/2018   Depression 06/13/2018   RLS (restless legs syndrome) 04/05/2018   Atypical chest pain 03/10/2018   CAD in native artery 03/10/2018   Elevated blood pressure reading 03/10/2018  Palpitations 03/10/2018   Chest pain 03/09/2018   Hyperlipidemia 03/08/2018   Coronary artery disease involving native coronary artery of native heart with unstable angina pectoris ([place]) 03/07/2018   Unstable angina (HCC)    Cough 01/02/2013   Anxiety state 01/02/2013   Breast cancer (DCIS), stage 0, Left, receptor +, dx 2012 09/08/2010   Past Medical History:  Diagnosis Date   Allergy    "dust mite residue, mold, mildew, grapefruit, chili peppers, cat dander, some weeds" (03/07/2018)   Anxiety    Arthritis    "minor; right knee, left hip" (03/07/2018)   Bruises easily    Cancer of left breast ([place])    double mastectomy 01/26/2011   Childhood asthma     Contact lens/glasses fitting    Coronary artery disease    a.  She underwent LHC 7/23 which revealed 95% stenosis of proximal LAD, managed with PCI/DES.    Depression    Essential hypertension    GERD (gastroesophageal reflux disease)    "at times" (03/07/2018)   Heart murmur    Hiatal hernia    High cholesterol    History of kidney stones    "lots; no OR" (03/07/2018)   Osteoporosis    Rectal bleeding    hemorroid   Ringing in ears, bilateral    "since ~ 06/2017" (03/07/2018)   Sinus drainage    Past Surgical History:  Procedure Laterality Date   APPENDECTOMY     BREAST LUMPECTOMY W/ NEEDLE LOCALIZATION Left 10/05/2010   lumpectomy   BREAST SURGERY     CORONARY STENT INTERVENTION N/A 03/07/2018   Procedure: CORONARY STENT INTERVENTION;  Surgeon: Troy Sine, MD;  Location: [facility];  Service: Cardiovascular;  Laterality: N/A;   LEFT HEART CATH AND CORONARY ANGIOGRAPHY N/A 03/07/2018   Procedure: LEFT HEART CATH AND CORONARY ANGIOGRAPHY;  Surgeon: Troy Sine, MD;  Location: [facility];  Service: Cardiovascular;  Laterality: N/A;   MASTECTOMY Right 01/26/2011   MASTECTOMY COMPLETE / SIMPLE W/ SENTINEL NODE BIOPSY Left 01/26/2011   OVARIAN CYST SURGERY  1997, 2001, 2008   Allergies  Allergen Reactions   Elavil [Amitriptyline Hcl] Rash   Sulfur Rash   Prior to Admission medications   Medication Sig Start Date End Date Taking? Authorizing Provider  albuterol (PROVENTIL HFA;VENTOLIN HFA) 108 (90 Base) MCG/ACT inhaler Inhale 1-2 puffs into the lungs every 4 (four) hours as needed for wheezing or shortness of breath. 05/16/18  Yes [name], MD  alendronate (FOSAMAX) 70 MG tablet  05/18/19  Yes [provider]  amLODipine (NORVASC) 5 MG tablet Take 1 tablet (5 mg total) by mouth daily. 04/16/19  Yes Troy Sine, MD  aspirin EC 81 MG tablet Take 1 tablet (81 mg total) by mouth daily. 02/27/18  Yes Troy Sine, MD   atorvastatin (LIPITOR) 80 MG tablet Take 0.5 tablets (40 mg total) by mouth daily at 6 PM. 04/02/19  Yes Kroeger, Daleen Snook M., PA-C  BRILINTA 90 MG TABS tablet TAKE 1 TABLET BY MOUTH TWICE A DAY 05/21/19  Yes Troy Sine, MD  cetirizine (ZYRTEC) 10 MG tablet Take 10 mg by mouth daily as needed for allergies.    Yes [provider]  FLUoxetine (PROZAC) 20 MG tablet TAKE 2 TABLETS BY MOUTH EVERY DAY 05/20/19  Yes [name], MD  fluticasone [facility]) 50 MCG/ACT nasal spray Place 2 sprays into both nostrils daily. Patient taking differently: Place 2 sprays into both nostrils as needed.  08/11/18  Yes [name], MD  metoprolol succinate (TOPROL-XL) 25 MG 24 hr tablet TAKE 1/2 TABLET BY MOUTH EVERY DAY 03/13/19  Yes Dunn, Dayna N, PA-C  nitroGLYCERIN (NITROSTAT) 0.4 MG SL tablet Place 1 tablet (0.4 mg total) under the tongue every 5 (five) minutes as needed for chest pain. 03/26/19  Yes Troy Sine, MD  pantoprazole (PROTONIX) 40 MG tablet Take 1 tablet (40 mg total) by mouth daily. 04/16/19  Yes Troy Sine, MD  Probiotic Product (ALIGN) 4 MG CAPS Take 1 capsule by mouth daily.   Yes [provider]  traZODone (DESYREL) 50 MG tablet Take 0.5 tablets (25 mg total) by mouth at bedtime as needed. for sleep 02/23/19  Yes [name], MD   Social History   Socioeconomic History   Marital status: Divorced    Spouse name: Not on file   Number of children: Not on file   Years of education: Not on file   Highest education level: Bachelor's degree (e.g., BA, AB, BS)  Occupational History   Not on file  Social Needs   Financial resource strain: Not on file   Food insecurity    Worry: Not on file    Inability: Not on file   Transportation needs    Medical: Not on file    Non-medical: Not on file  Tobacco Use   Smoking status: Never Smoker   Smokeless tobacco: Never Used  Substance and Sexual Activity   Alcohol use: Yes    Comment: 03/07/2018  "probably once/month"   Drug use: No   Sexual activity: Not on file  Lifestyle   Physical activity    Days per week: Not on file    Minutes per session: Not on file   Stress: Not on file  Relationships   Social connections    Talks on phone: Not on file    Gets together: Not on file    Attends religious service: Not on file    Active member of club or organization: Not on file    Attends meetings of clubs or organizations: Not on file    Relationship status: Not on file   Intimate partner violence    Fear of current or ex partner: Not on file    Emotionally abused: Not on file    Physically abused: Not on file    Forced sexual activity: Not on file  Other Topics Concern   Not on file  Social History Narrative   Lives at home alone   Right handed   Caffeine: 1/2 cup of coffee daily that has (1/2 & 1/2 caf)    Review of Systems Per HPI.     Objective:   Physical Exam Vitals signs reviewed.  Constitutional:      General: She is not in acute distress.    Appearance: She is well-developed.  HENT:     Head: Normocephalic and atraumatic.  Cardiovascular:     Rate and Rhythm: Normal rate.  Pulmonary:     Effort: Pulmonary effort is normal.  Musculoskeletal:     Right shoulder: She exhibits decreased strength. She exhibits normal range of motion, no tenderness and no bony tenderness.     Right elbow: Normal.    Right wrist: She exhibits normal range of motion, no tenderness (no bony TTP, but c/o "internal pain" with exam. positive finkelstein, negative tinel/phalen. ), no bony tenderness and no swelling.     Right hip: She exhibits normal range of motion,  normal strength, no tenderness (pain free IR/ER, negative FABER/FADIR ) and no bony tenderness.     Left hip: She exhibits normal range of motion, normal strength, no tenderness (pain free IR/ER, negative FABER/FADIR ) and no bony tenderness.     Cervical back: She exhibits decreased range of motion (slight decreased  ROM, but no radiating arm pain with cervical ROM. ). She exhibits no bony tenderness.     Thoracic back: She exhibits tenderness. She exhibits no spasm.       Back:     Right lower leg: She exhibits no tenderness (neg homans bilaterally) and no swelling. No edema.     Left lower leg: She exhibits no tenderness and no swelling. No edema.       Legs:  Neurological:     Mental Status: She is alert and oriented to person, place, and time.        Assessment & Plan:   NORMALEE SISTARE is a 68 y.o. female Upper back pain - Plan: DG Thoracic Spine 2 View, Ambulatory referral to Orthopedic Surgery  - osteoporosis on prior DEXA. Diffuse ttp, less likely compression fx without injury. Possible paraspinal strain/spasm.   -check t spine XR, symptomatic care, and eval with ortho but possible role for PT.   Myalgia - Plan: Ambulatory referral to Orthopedic Surgery Polyarthralgia - Plan: Ambulatory referral to Orthopedic Surgery, Sedimentation Rate, CK  -check ESR, CPK, but less likely inflammatory arthropathy.   Tenosynovitis, de Quervain - Plan: Ambulatory referral to Orthopedic Surgery, Thumb spica  - splint provided, handout given. Activity modification. eval by orthopaedics as proximal arm component. ddx includes cervical radiculopathy, but no change with C spine ROM.   Pain of left thigh - Plan: Ambulatory referral to Orthopedic Surgery  - in area of left hamstring - mild strain possible. Symptomatic care, eval with ortho. If persistent various myalgias, DDX includes fibromyalgia. Of note reports improved anxiety/depression as above.   Bilateral hip pain - Plan: Ambulatory referral to Orthopedic Surgery  - reassuring exam. ddx of mild OA possible. XR deferred at this time. PT possible - eval by ortho.   No orders of the defined types were placed in this encounter.  Patient Instructions    Try turmeric, glucosamine- chondroitin. Tylenol as needed for now.   Some of the arm pain may be  related to de Quervain's tenosynovitis.  Try wearing the brace and avoid repetitive activity with the right arm.  I will also refer you to orthopedics.  Back pain may be strain or spasm of the muscles.  Heat or ice to affected area along with Tylenol and other medications above may help but will also have that evaluated by orthopedics.  Please go to [facility] imaging today for the x-ray and I will let you know if there are concerns.  Hip exam was reassuring today.  As we discussed you could have some arthritis but I would like to see how the medications above help with recheck in the next 6 weeks.  Can also be discussed with orthopedics.  Thigh pain appears to be in area of hamstring.  Exam was overall reassuring today.  Physical therapy may help this area as well as other aches and pains, but that can be discussed with orthopedics.  I will check some inflammation tests but suspect those will be normal.  Please let me know if there are questions.     Muscle Pain, Adult Muscle pain (myalgia) may be mild or severe. In most cases,  the pain lasts only a short time and it goes away without treatment. It is normal to feel some muscle pain after starting a workout program. Muscles that have not been used often will be sore at first. Muscle pain may also be caused by many other things, including:  Overuse or muscle strain, especially if you are not in shape. This is the most common cause of muscle pain.  Injury.  Bruises.  Viruses, such as the flu.  Infectious diseases.  A chronic condition that causes muscle tenderness, fatigue, and headache (fibromyalgia).  A condition, such as lupus, in which the bodys disease-fighting system attacks other organs in the body (autoimmune or rheumatologic diseases).  Certain drugs, including ACE inhibitors and statins. To diagnose the cause of your muscle pain, your health care provider will do a physical exam and ask questions about the pain and when it  began. If you have not had muscle pain for very long, your health care provider may want to wait before doing much testing. If your muscle pain has lasted a long time, your health care provider may want to run tests right away. In some cases, this may include tests to rule out certain conditions or illnesses. Treatment for muscle pain depends on the cause. Home care is often enough to relieve muscle pain. Your health care provider may also prescribe anti-inflammatory medicine. Follow these instructions at home: Activity  If overuse is causing your muscle pain: ? Slow down your activities until the pain goes away. ? Do regular, gentle exercises if you are not usually active. ? Warm up before exercising. Stretch before and after exercising. This can help lower the risk of muscle pain.  Do not continue working out if the pain is very bad. Bad pain could mean that you have injured a muscle. Managing pain and discomfort   If directed, apply ice to the sore muscle: ? Put ice in a plastic bag. ? Place a towel between your skin and the bag. ? Leave the ice on for 20 minutes, 2-3 times a day.  You may also alternate between applying ice and applying heat as told by your health care provider. To apply heat, use the heat source that your health care provider recommends, such as a moist heat pack or a heating pad. ? Place a towel between your skin and the heat source. ? Leave the heat on for 20-30 minutes. ? Remove the heat if your skin turns bright red. This is especially important if you are unable to feel pain, heat, or cold. You may have a greater risk of getting burned. Medicines  Take over-the-counter and prescription medicines only as told by your health care provider.  Do not drive or use heavy machinery while taking prescription pain medicine. Contact a health care provider if:  Your muscle pain gets worse and medicines do not help.  You have muscle pain that lasts longer than 3  days.  You have a rash or fever along with muscle pain.  You have muscle pain after a tick bite.  You have muscle pain while working out, even though you are in good physical condition.  You have redness, soreness, or swelling along with muscle pain.  You have muscle pain after starting a new medicine or changing the dose of a medicine. Get help right away if:  You have trouble breathing.  You have trouble swallowing.  You have muscle pain along with a stiff neck, fever, and vomiting.  You have severe  muscle weakness or cannot move part of your body. This information is not intended to replace advice given to you by your health care provider. Make sure you discuss any questions you have with your health care provider. Document Released: 06/24/2006 Document Revised: 07/15/2017 Document Reviewed: 12/23/2015 Elsevier Patient Education  [place] Tenosynovitis  De Quervain's tenosynovitis is a condition that causes inflammation of the tendon on the thumb side of the wrist. Tendons are cords of tissue that connect bones to muscles. The tendons in the hand pass through a tunnel called a sheath. A slippery layer of tissue (synovium) lets the tendons move smoothly in the sheath. With de Quervain's tenosynovitis, the sheath swells or thickens, causing friction and pain. The condition is also called de Quervain's disease and de Quervain's syndrome. It occurs most often in women who are 72-47 years old. What are the causes? The exact cause of this condition is not known. It may be associated with overuse of the hand and wrist. What increases the risk? You are more likely to develop this condition if you:  Use your hands far more than normal, especially if you repeat certain movements that involve twisting your hand or using a tight grip.  Are pregnant.  Are a middle-aged woman.  Have rheumatoid arthritis.  Have diabetes. What are the signs or symptoms? The  main symptom of this condition is pain on the thumb side of the wrist. The pain may get worse when you grasp something or turn your wrist. Other symptoms may include:  Pain that extends up the forearm.  Swelling of your wrist and hand.  Trouble moving the thumb and wrist.  A sensation of snapping in the wrist.  A bump filled with fluid (cyst) in the area of the pain. How is this diagnosed? This condition may be diagnosed based on:  Your symptoms and medical history.  A physical exam. During the exam, your health care provider may do a simple test Wynn M[place]lin test) that involves pulling your thumb and wrist to see if this causes pain. You may also need to have an X-ray. How is this treated? Treatment for this condition may include:  Avoiding any activity that causes pain and swelling.  Taking medicines. Anti-inflammatory medicines and corticosteroid injections may be used to reduce inflammation and relieve pain.  Wearing a splint.  Having surgery. This may be needed if other treatments do not work. Once the pain and swelling has gone down:  Physical therapy. This includes stretching and strengthening exercises.  Occupational therapy. This includes adjusting how you move your wrist. Follow these instructions at home: If you have a splint:  Wear the splint as told by your health care provider. Remove it only as told by your health care provider.  Loosen the splint if your fingers tingle, become numb, or turn cold and blue.  Keep the splint clean.  If the splint is not waterproof: ? Do not let it get wet. ? Cover it with a watertight covering when you take a bath or a shower. Managing pain, stiffness, and swelling   Avoid movements and activities that cause pain and swelling in the wrist area.  If directed, put ice on the painful area. This may be helpful after doing activities that involve the sore wrist. ? Put ice in a plastic bag. ? Place a towel between your skin  and the bag. ? Leave the ice on for 20 minutes, 2-3 times a day.  Move your fingers  often to avoid stiffness and to lessen swelling.  Raise (elevate) the injured area above the level of your heart while you are sitting or lying down. General instructions  Return to your normal activities as told by your health care provider. Ask your health care provider what activities are safe for you.  Take over-the-counter and prescription medicines only as told by your health care provider.  Keep all follow-up visits as told by your health care provider. This is important. Contact a health care provider if:  Your pain medicine does not help.  Your pain gets worse.  You develop new symptoms. Summary  De Quervain's tenosynovitis is a condition that causes inflammation of the tendon on the thumb side of the wrist.  The condition occurs most often in women who are 1-54 years old.  The exact cause of this condition is not known. It may be associated with overuse of the hand and wrist.  Treatment starts with avoiding activity that causes pain or swelling in the wrist area. Other treatment may include wearing a splint and taking medicine. Sometimes, surgery is needed. This information is not intended to replace advice given to you by your health care provider. Make sure you discuss any questions you have with your health care provider. Document Released: 04/27/2001 Document Revised: 02/02/2018 Document Reviewed: 07/11/2017 Elsevier Patient Education  [place].  Acute Back Pain, Adult Acute back pain is sudden and usually short-lived. It is often caused by an injury to the muscles and tissues in the back. The injury may result from:  A muscle or ligament getting overstretched or torn (strained). Ligaments are tissues that connect bones to each other. Lifting something improperly can cause a back strain.  Wear and tear (degeneration) of the spinal disks. Spinal disks are circular tissue  that provides cushioning between the bones of the spine (vertebrae).  Twisting motions, such as while playing sports or doing yard work.  A hit to the back.  Arthritis. You may have a physical exam, lab tests, and imaging tests to find the cause of your pain. Acute back pain usually goes away with rest and home care. Follow these instructions at home: Managing pain, stiffness, and swelling  Take over-the-counter and prescription medicines only as told by your health care provider.  Your health care provider may recommend applying ice during the first 24-48 hours after your pain starts. To do this: ? Put ice in a plastic bag. ? Place a towel between your skin and the bag. ? Leave the ice on for 20 minutes, 2-3 times a day.  If directed, apply heat to the affected area as often as told by your health care provider. Use the heat source that your health care provider recommends, such as a moist heat pack or a heating pad. ? Place a towel between your skin and the heat source. ? Leave the heat on for 20-30 minutes. ? Remove the heat if your skin turns bright red. This is especially important if you are unable to feel pain, heat, or cold. You have a greater risk of getting burned. Activity   Do not stay in bed. Staying in bed for more than 1-2 days can delay your recovery.  Sit up and stand up straight. Avoid leaning forward when you sit, or hunching over when you stand. ? If you work at a desk, sit close to it so you do not need to lean over. Keep your chin tucked in. Keep your neck drawn back, and  keep your elbows bent at a right angle. Your arms should look like the letter "L." ? Sit high and close to the steering wheel when you drive. Add lower back (lumbar) support to your car seat, if needed.  Take short walks on even surfaces as soon as you are able. Try to increase the length of time you walk each day.  Do not sit, drive, or stand in one place for more than 30 minutes at a time.  Sitting or standing for long periods of time can put stress on your back.  Do not drive or use heavy machinery while taking prescription pain medicine.  Use proper lifting techniques. When you bend and lift, use positions that put less stress on your back: ? [place] your knees. ? Keep the load close to your body. ? Avoid twisting.  Exercise regularly as told by your health care provider. Exercising helps your back heal faster and helps prevent back injuries by keeping muscles strong and flexible.  Work with a physical therapist to make a safe exercise program, as recommended by your health care provider. Do any exercises as told by your physical therapist. Lifestyle  Maintain a healthy weight. Extra weight puts stress on your back and makes it difficult to have good posture.  Avoid activities or situations that make you feel anxious or stressed. Stress and anxiety increase muscle tension and can make back pain worse. Learn ways to manage anxiety and stress, such as through exercise. General instructions  Sleep on a firm mattress in a comfortable position. Try lying on your side with your knees slightly bent. If you lie on your back, put a pillow under your knees.  Follow your treatment plan as told by your health care provider. This may include: ? Cognitive or behavioral therapy. ? Acupuncture or massage therapy. ? Meditation or yoga. Contact a health care provider if:  You have pain that is not relieved with rest or medicine.  You have increasing pain going down into your legs or buttocks.  Your pain does not improve after 2 weeks.  You have pain at night.  You lose weight without trying.  You have a fever or chills. Get help right away if:  You develop new bowel or bladder control problems.  You have unusual weakness or numbness in your arms or legs.  You develop nausea or vomiting.  You develop abdominal pain.  You feel faint. Summary  Acute back pain is sudden and  usually short-lived.  Use proper lifting techniques. When you bend and lift, use positions that put less stress on your back.  Take over-the-counter and prescription medicines and apply heat or ice as directed by your health care provider. This information is not intended to replace advice given to you by your health care provider. Make sure you discuss any questions you have with your health care provider. Document Released: 08/02/2005 Document Revised: 11/21/2018 Document Reviewed: 03/16/2017 Elsevier Patient Education  [employer].    If you have lab work done today you will be contacted with your lab results within the next 2 weeks.  If you have not heard from [place] then please contact us. The fastest way to get your results is to register for My Chart.   IF you received an x-ray today, you will receive an invoice from [facility]. Please contact [facility] at [phone] with questions or concerns regarding your invoice.   IF you received labwork today, you will receive an invoice from [employer].  Please contact [facility] at [phone] with questions or concerns regarding your invoice.   Our billing staff will not be able to assist you with questions regarding bills from these companies.  You will be contacted with the lab results as soon as they are available. The fastest way to get your results is to activate your My Chart account. Instructions are located on the last page of this paperwork. If you have not heard from [place] regarding the results in 2 weeks, please contact this office.       Signed,   [name], MD [facility] [place].  05/25/19 5:24 PM

## 2019-05-25 NOTE — Patient Instructions (Addendum)
Try turmeric, glucosamine- chondroitin. Tylenol as needed for now.   Some of the arm pain may be related to de Quervain's tenosynovitis.  Try wearing the brace and avoid repetitive activity with the right arm.  I will also refer you to orthopedics.  Back pain may be strain or spasm of the muscles.  Heat or ice to affected area along with Tylenol and other medications above may help but will also have that evaluated by orthopedics.  Please go to [facility] imaging today for the x-ray and I will let you know if there are concerns.  Hip exam was reassuring today.  As we discussed you could have some arthritis but I would like to see how the medications above help with recheck in the next 6 weeks.  Can also be discussed with orthopedics.  Thigh pain appears to be in area of hamstring.  Exam was overall reassuring today.  Physical therapy may help this area as well as other aches and pains, but that can be discussed with orthopedics.  I will check some inflammation tests but suspect those will be normal.  Please let me know if there are questions.     Muscle Pain, Adult Muscle pain (myalgia) may be mild or severe. In most cases, the pain lasts only a short time and it goes away without treatment. It is normal to feel some muscle pain after starting a workout program. Muscles that have not been used often will be sore at first. Muscle pain may also be caused by many other things, including:  Overuse or muscle strain, especially if you are not in shape. This is the most common cause of muscle pain.  Injury.  Bruises.  Viruses, such as the flu.  Infectious diseases.  A chronic condition that causes muscle tenderness, fatigue, and headache (fibromyalgia).  A condition, such as lupus, in which the body's disease-fighting system attacks other organs in the body (autoimmune or rheumatologic diseases).  Certain drugs, including ACE inhibitors and statins. To diagnose the cause of your muscle  pain, your health care provider will do a physical exam and ask questions about the pain and when it began. If you have not had muscle pain for very long, your health care provider may want to wait before doing much testing. If your muscle pain has lasted a long time, your health care provider may want to run tests right away. In some cases, this may include tests to rule out certain conditions or illnesses. Treatment for muscle pain depends on the cause. Home care is often enough to relieve muscle pain. Your health care provider may also prescribe anti-inflammatory medicine. Follow these instructions at home: Activity  If overuse is causing your muscle pain: ? Slow down your activities until the pain goes away. ? Do regular, gentle exercises if you are not usually active. ? Warm up before exercising. Stretch before and after exercising. This can help lower the risk of muscle pain.  Do not continue working out if the pain is very bad. Bad pain could mean that you have injured a muscle. Managing pain and discomfort   If directed, apply ice to the sore muscle: ? Put ice in a plastic bag. ? Place a towel between your skin and the bag. ? Leave the ice on for 20 minutes, 2-3 times a day.  You may also alternate between applying ice and applying heat as told by your health care provider. To apply heat, use the heat source that your health care provider  recommends, such as a moist heat pack or a heating pad. ? Place a towel between your skin and the heat source. ? Leave the heat on for 20-30 minutes. ? Remove the heat if your skin turns bright red. This is especially important if you are unable to feel pain, heat, or cold. You may have a greater risk of getting burned. Medicines  Take over-the-counter and prescription medicines only as told by your health care provider.  Do not drive or use heavy machinery while taking prescription pain medicine. Contact a health care provider if:  Your muscle  pain gets worse and medicines do not help.  You have muscle pain that lasts longer than 3 days.  You have a rash or fever along with muscle pain.  You have muscle pain after a tick bite.  You have muscle pain while working out, even though you are in good physical condition.  You have redness, soreness, or swelling along with muscle pain.  You have muscle pain after starting a new medicine or changing the dose of a medicine. Get help right away if:  You have trouble breathing.  You have trouble swallowing.  You have muscle pain along with a stiff neck, fever, and vomiting.  You have severe muscle weakness or cannot move part of your body. This information is not intended to replace advice given to you by your health care provider. Make sure you discuss any questions you have with your health care provider. Document Released: 06/24/2006 Document Revised: 07/15/2017 Document Reviewed: 12/23/2015 Elsevier Patient Education  [place] Tenosynovitis  De Quervain's tenosynovitis is a condition that causes inflammation of the tendon on the thumb side of the wrist. Tendons are cords of tissue that connect bones to muscles. The tendons in the hand pass through a tunnel called a sheath. A slippery layer of tissue (synovium) lets the tendons move smoothly in the sheath. With de Quervain's tenosynovitis, the sheath swells or thickens, causing friction and pain. The condition is also called de Quervain's disease and de Quervain's syndrome. It occurs most often in women who are 51-20 years old. What are the causes? The exact cause of this condition is not known. It may be associated with overuse of the hand and wrist. What increases the risk? You are more likely to develop this condition if you:  Use your hands far more than normal, especially if you repeat certain movements that involve twisting your hand or using a tight grip.  Are pregnant.  Are a middle-aged  woman.  Have rheumatoid arthritis.  Have diabetes. What are the signs or symptoms? The main symptom of this condition is pain on the thumb side of the wrist. The pain may get worse when you grasp something or turn your wrist. Other symptoms may include:  Pain that extends up the forearm.  Swelling of your wrist and hand.  Trouble moving the thumb and wrist.  A sensation of snapping in the wrist.  A bump filled with fluid (cyst) in the area of the pain. How is this diagnosed? This condition may be diagnosed based on:  Your symptoms and medical history.  A physical exam. During the exam, your health care provider may do a simple test [place] test) that involves pulling your thumb and wrist to see if this causes pain. You may also need to have an X-ray. How is this treated? Treatment for this condition may include:  Avoiding any activity that causes pain and swelling.  Taking medicines. Anti-inflammatory medicines and corticosteroid injections may be used to reduce inflammation and relieve pain.  Wearing a splint.  Having surgery. This may be needed if other treatments do not work. Once the pain and swelling has gone down:  Physical therapy. This includes stretching and strengthening exercises.  Occupational therapy. This includes adjusting how you move your wrist. Follow these instructions at home: If you have a splint:  Wear the splint as told by your health care provider. Remove it only as told by your health care provider.  Loosen the splint if your fingers tingle, become numb, or turn cold and blue.  Keep the splint clean.  If the splint is not waterproof: ? Do not let it get wet. ? Cover it with a watertight covering when you take a bath or a shower. Managing pain, stiffness, and swelling   Avoid movements and activities that cause pain and swelling in the wrist area.  If directed, put ice on the painful area. This may be helpful after doing activities  that involve the sore wrist. ? Put ice in a plastic bag. ? Place a towel between your skin and the bag. ? Leave the ice on for 20 minutes, 2-3 times a day.  Move your fingers often to avoid stiffness and to lessen swelling.  Raise (elevate) the injured area above the level of your heart while you are sitting or lying down. General instructions  Return to your normal activities as told by your health care provider. Ask your health care provider what activities are safe for you.  Take over-the-counter and prescription medicines only as told by your health care provider.  Keep all follow-up visits as told by your health care provider. This is important. Contact a health care provider if:  Your pain medicine does not help.  Your pain gets worse.  You develop new symptoms. Summary  De Quervain's tenosynovitis is a condition that causes inflammation of the tendon on the thumb side of the wrist.  The condition occurs most often in women who are 15-71 years old.  The exact cause of this condition is not known. It may be associated with overuse of the hand and wrist.  Treatment starts with avoiding activity that causes pain or swelling in the wrist area. Other treatment may include wearing a splint and taking medicine. Sometimes, surgery is needed. This information is not intended to replace advice given to you by your health care provider. Make sure you discuss any questions you have with your health care provider. Document Released: 04/27/2001 Document Revised: 02/02/2018 Document Reviewed: 07/11/2017 Elsevier Patient Education  [employer].  Acute Back Pain, Adult Acute back pain is sudden and usually short-lived. It is often caused by an injury to the muscles and tissues in the back. The injury may result from:  A muscle or ligament getting overstretched or torn (strained). Ligaments are tissues that connect bones to each other. Lifting something improperly can cause a back  strain.  Wear and tear (degeneration) of the spinal disks. Spinal disks are circular tissue that provides cushioning between the bones of the spine (vertebrae).  Twisting motions, such as while playing sports or doing yard work.  A hit to the back.  Arthritis. You may have a physical exam, lab tests, and imaging tests to find the cause of your pain. Acute back pain usually goes away with rest and home care. Follow these instructions at home: Managing pain, stiffness, and swelling  Take over-the-counter and prescription medicines  only as told by your health care provider.  Your health care provider may recommend applying ice during the first 24-48 hours after your pain starts. To do this: ? Put ice in a plastic bag. ? Place a towel between your skin and the bag. ? Leave the ice on for 20 minutes, 2-3 times a day.  If directed, apply heat to the affected area as often as told by your health care provider. Use the heat source that your health care provider recommends, such as a moist heat pack or a heating pad. ? Place a towel between your skin and the heat source. ? Leave the heat on for 20-30 minutes. ? Remove the heat if your skin turns bright red. This is especially important if you are unable to feel pain, heat, or cold. You have a greater risk of getting burned. Activity   Do not stay in bed. Staying in bed for more than 1-2 days can delay your recovery.  Sit up and stand up straight. Avoid leaning forward when you sit, or hunching over when you stand. ? If you work at a desk, sit close to it so you do not need to lean over. Keep your chin tucked in. Keep your neck drawn back, and keep your elbows bent at a right angle. Your arms should look like the letter "L." ? Sit high and close to the steering wheel when you drive. Add lower back (lumbar) support to your car seat, if needed.  Take short walks on even surfaces as soon as you are able. Try to increase the length of time you  walk each day.  Do not sit, drive, or stand in one place for more than 30 minutes at a time. Sitting or standing for long periods of time can put stress on your back.  Do not drive or use heavy machinery while taking prescription pain medicine.  Use proper lifting techniques. When you bend and lift, use positions that put less stress on your back: ? [place] your knees. ? Keep the load close to your body. ? Avoid twisting.  Exercise regularly as told by your health care provider. Exercising helps your back heal faster and helps prevent back injuries by keeping muscles strong and flexible.  Work with a physical therapist to make a safe exercise program, as recommended by your health care provider. Do any exercises as told by your physical therapist. Lifestyle  Maintain a healthy weight. Extra weight puts stress on your back and makes it difficult to have good posture.  Avoid activities or situations that make you feel anxious or stressed. Stress and anxiety increase muscle tension and can make back pain worse. Learn ways to manage anxiety and stress, such as through exercise. General instructions  Sleep on a firm mattress in a comfortable position. Try lying on your side with your knees slightly bent. If you lie on your back, put a pillow under your knees.  Follow your treatment plan as told by your health care provider. This may include: ? Cognitive or behavioral therapy. ? Acupuncture or massage therapy. ? Meditation or yoga. Contact a health care provider if:  You have pain that is not relieved with rest or medicine.  You have increasing pain going down into your legs or buttocks.  Your pain does not improve after 2 weeks.  You have pain at night.  You lose weight without trying.  You have a fever or chills. Get help right away if:  You develop new  bowel or bladder control problems.  You have unusual weakness or numbness in your arms or legs.  You develop nausea or  vomiting.  You develop abdominal pain.  You feel faint. Summary  Acute back pain is sudden and usually short-lived.  Use proper lifting techniques. When you bend and lift, use positions that put less stress on your back.  Take over-the-counter and prescription medicines and apply heat or ice as directed by your health care provider. This information is not intended to replace advice given to you by your health care provider. Make sure you discuss any questions you have with your health care provider. Document Released: 08/02/2005 Document Revised: 11/21/2018 Document Reviewed: 03/16/2017 Elsevier Patient Education  [employer].    If you have lab work done today you will be contacted with your lab results within the next 2 weeks.  If you have not heard from [place] then please contact us. The fastest way to get your results is to register for My Chart.   IF you received an x-ray today, you will receive an invoice from [facility]. Please contact [facility] at [phone] with questions or concerns regarding your invoice.   IF you received labwork today, you will receive an invoice from [place]. Please contact [facility] at [phone] with questions or concerns regarding your invoice.   Our billing staff will not be able to assist you with questions regarding bills from these companies.  You will be contacted with the lab results as soon as they are available. The fastest way to get your results is to activate your My Chart account. Instructions are located on the last page of this paperwork. If you have not heard from [place] regarding the results in 2 weeks, please contact this office.

## 2019-05-26 LAB — CK: Total CK: 152 U/L (ref 32–182)

## 2019-05-26 LAB — SEDIMENTATION RATE: Sed Rate: 4 mm/hr (ref 0–40)

## 2019-07-02 ENCOUNTER — Ambulatory Visit: Payer: Medicare Other | Admitting: Family Medicine

## 2019-07-27 ENCOUNTER — Other Ambulatory Visit: Payer: Self-pay | Admitting: Cardiovascular Disease

## 2019-07-28 ENCOUNTER — Other Ambulatory Visit: Payer: Self-pay | Admitting: Family Medicine

## 2019-07-28 ENCOUNTER — Other Ambulatory Visit: Payer: Self-pay | Admitting: Cardiovascular Disease

## 2019-07-28 DIAGNOSIS — F411 Generalized anxiety disorder: Secondary | ICD-10-CM

## 2019-07-31 ENCOUNTER — Ambulatory Visit: Payer: PRIVATE HEALTH INSURANCE | Admitting: Cardiovascular Disease

## 2019-08-19 ENCOUNTER — Other Ambulatory Visit: Payer: Self-pay | Admitting: Cardiovascular Disease

## 2019-08-21 ENCOUNTER — Other Ambulatory Visit: Payer: Self-pay | Admitting: Cardiovascular Disease

## 2019-09-20 ENCOUNTER — Other Ambulatory Visit: Payer: Self-pay | Admitting: Family Medicine

## 2019-09-20 DIAGNOSIS — F411 Generalized anxiety disorder: Secondary | ICD-10-CM

## 2019-10-03 LAB — COMPREHENSIVE METABOLIC PANEL
ALT: 24 IU/L (ref 0–32)
AST: 30 IU/L (ref 0–40)
Albumin/Globulin Ratio: 1.9 (ref 1.2–2.2)
Albumin: 4.5 g/dL (ref 3.8–4.8)
Alkaline Phosphatase: 145 IU/L — ABNORMAL HIGH (ref 39–117)
BUN/Creatinine Ratio: 19 (ref 12–28)
BUN: 16 mg/dL (ref 8–27)
Bilirubin Total: 1.4 mg/dL — ABNORMAL HIGH (ref 0.0–1.2)
CO2: 23 mmol/L (ref 20–29)
Calcium: 8.7 mg/dL (ref 8.7–10.3)
Chloride: 104 mmol/L (ref 96–106)
Creatinine, Ser: 0.86 mg/dL (ref 0.57–1.00)
GFR calc Af Amer: 80 mL/min/{1.73_m2} (ref >59)
GFR calc non Af Amer: 70 mL/min/{1.73_m2} (ref >59)
Globulin, Total: 2.4 g/dL (ref 1.5–4.5)
Glucose: 95 mg/dL (ref 65–99)
Potassium: 4.5 mmol/L (ref 3.5–5.2)
Sodium: 140 mmol/L (ref 134–144)
Total Protein: 6.9 g/dL (ref 6.0–8.5)

## 2019-10-03 LAB — CBC
Hematocrit: 41.4 % (ref 34.0–46.6)
Hemoglobin: 13.8 g/dL (ref 11.1–15.9)
MCH: 32.2 pg (ref 26.6–33.0)
MCHC: 33.3 g/dL (ref 31.5–35.7)
MCV: 97 fL (ref 79–97)
Platelets: 227 10*3/uL (ref 150–450)
RBC: 4.28 x10E6/uL (ref 3.77–5.28)
RDW: 12.6 % (ref 11.7–15.4)
WBC: 6.8 10*3/uL (ref 3.4–10.8)

## 2019-10-03 LAB — LIPID PANEL
Chol/HDL Ratio: 2.4 ratio (ref 0.0–4.4)
Cholesterol, Total: 113 mg/dL (ref 100–199)
HDL: 47 mg/dL (ref >39)
LDL Chol Calc (NIH): 46 mg/dL (ref 0–99)
Triglycerides: 107 mg/dL (ref 0–149)
VLDL Cholesterol Cal: 20 mg/dL (ref 5–40)

## 2019-10-03 LAB — TSH: TSH: 1.39 u[IU]/mL (ref 0.450–4.500)

## 2019-10-03 LAB — HEMOGLOBIN A1C
Est. average glucose Bld gHb Est-mCnc: 123 mg/dL
Hgb A1c MFr Bld: 5.9 % — ABNORMAL HIGH (ref 4.8–5.6)

## 2019-10-14 ENCOUNTER — Other Ambulatory Visit: Payer: Self-pay | Admitting: Family Medicine

## 2019-10-14 DIAGNOSIS — F411 Generalized anxiety disorder: Secondary | ICD-10-CM

## 2019-10-15 ENCOUNTER — Other Ambulatory Visit: Payer: Self-pay | Admitting: Cardiovascular Disease

## 2019-10-17 ENCOUNTER — Other Ambulatory Visit: Payer: Self-pay | Admitting: Cardiovascular Disease

## 2019-11-09 ENCOUNTER — Other Ambulatory Visit: Payer: Self-pay | Admitting: Family Medicine

## 2019-11-09 DIAGNOSIS — F411 Generalized anxiety disorder: Secondary | ICD-10-CM

## 2019-11-12 ENCOUNTER — Other Ambulatory Visit: Payer: Self-pay | Admitting: Cardiovascular Disease

## 2019-11-12 NOTE — Telephone Encounter (Signed)
Rx request sent to pharmacy.  

## 2019-11-13 ENCOUNTER — Encounter: Payer: Self-pay | Admitting: Family Medicine

## 2019-11-13 ENCOUNTER — Ambulatory Visit: Payer: PRIVATE HEALTH INSURANCE | Admitting: Family Medicine

## 2019-11-13 VITALS — BP 124/78 | HR 68 | Ht 62.0 in | Wt 140.0 lb

## 2019-11-13 DIAGNOSIS — Z789 Other specified health status: Secondary | ICD-10-CM

## 2019-11-13 NOTE — Progress Notes (Signed)
Subjective:     Patient ID: [name], female   DOB: [birth year], 69 y.o.   MRN: [id number]  HPI [name] her required wellness visit for her insurance. Her PCP is Dr. Carlota Raspberry. She reports she sees him regularly as well as her cardiologist. She has had a mastectomy so no mammogram. Colonoscopy was done in 2017. Pap smear she states was completed last year. She reports working on eating healthier and exercising.   Concerned about her left inner ankle. States 3 weeks ago she walked into the foot of a rocking chair. Noticed significant swelling and bruising. Able to ambulate. States tender to area, but able to tolerate without taking any otc pain medication. States now swelling and bruising are finally getting better.   She denies any other concerns.  Past Medical History:  Diagnosis Date  . Allergy    "dust mite residue, mold, mildew, grapefruit, chili peppers, cat dander, some weeds" (03/07/2018)  . Anxiety   . Arthritis    "minor; right knee, left hip" (03/07/2018)  . Bruises easily   . Cancer of left breast ([place])    double mastectomy 01/26/2011  . Childhood asthma   . Contact lens/glasses fitting   . Coronary artery disease    a.  She underwent LHC 7/23 which revealed 95% stenosis of proximal LAD, managed with PCI/DES.   . Depression   . Essential hypertension   . GERD (gastroesophageal reflux disease)    "at times" (03/07/2018)  . Heart murmur   . Hiatal hernia   . High cholesterol   . History of kidney stones    "lots; no OR" (03/07/2018)  . Osteoporosis   . Rectal bleeding    hemorroid  . Ringing in ears, bilateral    "since ~ 06/2017" (03/07/2018)  . Sinus drainage    Allergies  Allergen Reactions  . Other     Grapefruit  . Elavil [Amitriptyline Hcl] Rash  . Sulfur Rash    Current Outpatient Medications:  .  alendronate (FOSAMAX) 70 MG tablet, , Disp: , Rfl:  .  amLODipine (NORVASC) 5 MG tablet, TAKE 1 TABLET BY  MOUTH EVERY DAY, Disp: 30 tablet, Rfl: 4 .  aspirin EC 81 MG tablet, Take 1 tablet (81 mg total) by mouth daily., Disp: 90 tablet, Rfl: 3 .  atorvastatin (LIPITOR) 80 MG tablet, Take 0.5 tablets (40 mg total) by mouth daily at 6 PM., Disp: 90 tablet, Rfl: 3 .  cetirizine (ZYRTEC) 10 MG tablet, Take 10 mg by mouth daily as needed for allergies. , Disp: , Rfl:  .  FLUoxetine (PROZAC) 20 MG tablet, TAKE 2 TABLETS BY MOUTH EVERY DAY, Disp: 60 tablet, Rfl: 0 .  fluticasone (FLONASE) 50 MCG/ACT nasal spray, Place 2 sprays into both nostrils daily. (Patient taking differently: Place 2 sprays into both nostrils as needed. ), Disp: 16 g, Rfl: 6 .  metoprolol succinate (TOPROL-XL) 25 MG 24 hr tablet, TAKE 1/2 TABLET BY MOUTH EVERY DAY, Disp: 15 tablet, Rfl: 11 .  pantoprazole (PROTONIX) 40 MG tablet, TAKE 1 TABLET BY MOUTH EVERY DAY, Disp: 30 tablet, Rfl: 4 .  Probiotic Product (ALIGN) 4 MG CAPS, Take 1 capsule by mouth daily., Disp: , Rfl:  .  ticagrelor (BRILINTA) 90 MG TABS tablet, Take 1 tablet (90 mg total) by mouth 2 (two) times daily. Please schedule a follow-up appointment with Dr. Claiborne Billings., Disp: 60 tablet, Rfl: 0 .  traZODone (DESYREL) 50 MG tablet, Take 0.5  tablets (25 mg total) by mouth at bedtime as needed. for sleep, Disp: 30 tablet, Rfl: 2 .  albuterol (PROVENTIL HFA;VENTOLIN HFA) 108 (90 Base) MCG/ACT inhaler, Inhale 1-2 puffs into the lungs every 4 (four) hours as needed for wheezing or shortness of breath. (Patient not taking: Reported on 11/13/2019), Disp: 1 Inhaler, Rfl: 0 .  nitroGLYCERIN (NITROSTAT) 0.4 MG SL tablet, PLACE 1 TABLET (0.4 MG TOTAL) UNDER THE TONGUE EVERY 5 (FIVE) MINUTES AS NEEDED FOR CHEST PAIN., Disp: 75 tablet, Rfl: 3  Review of Systems  Constitutional: Negative for chills, fatigue, fever and unexpected weight change.  HENT: Negative for congestion, ear pain, sinus pressure, sinus pain and sore throat.   Eyes: Negative for discharge and visual disturbance.  Respiratory:  Negative for cough, shortness of breath and wheezing.   Cardiovascular: Negative for chest pain and leg swelling.  Gastrointestinal: Negative for abdominal pain, blood in stool, constipation, diarrhea, nausea and vomiting.  Genitourinary: Negative for difficulty urinating and hematuria.  Skin: Negative for color change.  Neurological: Negative for dizziness, weakness, light-headedness and headaches.  Hematological: Negative for adenopathy.  All other systems reviewed and are negative.      Objective:   Physical Exam Vitals reviewed.  Constitutional:      General: She is not in acute distress.    Appearance: Normal appearance. She is well-developed.  HENT:     Head: Normocephalic and atraumatic.  Eyes:     General:        Right eye: No discharge.        Left eye: No discharge.  Cardiovascular:     Rate and Rhythm: Normal rate and regular rhythm.     Heart sounds: Murmur (2/6 mild systolic murmur auscultated at the aortic listening point. ) present.  Pulmonary:     Effort: Pulmonary effort is normal. No respiratory distress.     Breath sounds: Normal breath sounds.  Musculoskeletal:     Cervical back: Neck supple.     Comments: Left medial ankle with green/yellow ecchymosis posterior to the medial malleolus. Mild tenderness to posterior medial malleolus. No edema or erythema noted. Skin intact. Post Tib and pedal pulses 2+. Sensation intact. Normal gait.   Skin:    General: Skin is warm and dry.  Neurological:     Mental Status: She is alert and oriented to person, place, and time.  Psychiatric:        Mood and Affect: Mood normal.        Behavior: Behavior normal.    Today's Vitals   11/13/19 1604  BP: 124/78  Pulse: 68  SpO2: 97%  Weight: 140 lb (63.5 kg)  Height: 5\' 2"  (1.575 m)   Body mass index is 25.61 kg/m.     Assessment:     Participant in health and wellness plan      Plan:     1. Keep all regular appts with PCP and specialists.  2. Encouraged  healthy eating and increasing physical activity.  3. Ankle appears to be healing well, evidence of healing ecchymosis and minimal to no swelling. Continue to monitor for complete resolution of symptoms. If worsens please f/u.  4. F/u prn.

## 2019-11-28 ENCOUNTER — Other Ambulatory Visit: Payer: Self-pay | Admitting: Cardiovascular Disease

## 2019-12-02 ENCOUNTER — Other Ambulatory Visit: Payer: Self-pay | Admitting: Family Medicine

## 2019-12-02 DIAGNOSIS — F411 Generalized anxiety disorder: Secondary | ICD-10-CM

## 2019-12-02 NOTE — Telephone Encounter (Signed)
Requested medications are due for refill today?  Yes   Requested medications are on active medication list? Yes  Last Refill:  11/09/2019   # 60 with no refills.  Notation that this was patient's second courtesy refill and patient must make office visit.    Future visit scheduled?  No  Notes to Clinic:  Per above - patient has had two courtesy refills.  Patient needs office visit.

## 2019-12-04 ENCOUNTER — Telehealth: Payer: Self-pay

## 2019-12-04 DIAGNOSIS — F411 Generalized anxiety disorder: Secondary | ICD-10-CM

## 2019-12-04 MED ORDER — FLUOXETINE HCL 20 MG PO TABS: 40.0000 mg | ORAL_TABLET | Freq: Every day | ORAL | 0 refills | Status: DC

## 2019-12-04 NOTE — Telephone Encounter (Signed)
Please call to schedule a follow up for check on chronic medical conditions.

## 2019-12-06 NOTE — Telephone Encounter (Signed)
Called pt to schedule- mb full

## 2019-12-26 ENCOUNTER — Other Ambulatory Visit: Payer: Self-pay | Admitting: Family Medicine

## 2019-12-26 ENCOUNTER — Other Ambulatory Visit: Payer: Self-pay | Admitting: Cardiovascular Disease

## 2019-12-26 DIAGNOSIS — I10 Essential (primary) hypertension: Secondary | ICD-10-CM | POA: Insufficient documentation

## 2019-12-26 DIAGNOSIS — M810 Age-related osteoporosis without current pathological fracture: Secondary | ICD-10-CM | POA: Insufficient documentation

## 2019-12-26 DIAGNOSIS — E7800 Pure hypercholesterolemia, unspecified: Secondary | ICD-10-CM | POA: Insufficient documentation

## 2019-12-26 DIAGNOSIS — F411 Generalized anxiety disorder: Secondary | ICD-10-CM

## 2019-12-26 DIAGNOSIS — G4700 Insomnia, unspecified: Secondary | ICD-10-CM

## 2020-01-11 ENCOUNTER — Other Ambulatory Visit: Payer: Self-pay | Admitting: Cardiovascular Disease

## 2020-01-13 ENCOUNTER — Other Ambulatory Visit: Payer: Self-pay | Admitting: Family Medicine

## 2020-01-13 DIAGNOSIS — F411 Generalized anxiety disorder: Secondary | ICD-10-CM

## 2020-01-13 DIAGNOSIS — G4700 Insomnia, unspecified: Secondary | ICD-10-CM

## 2020-01-13 NOTE — Telephone Encounter (Signed)
Please advise- needs appt

## 2020-01-22 ENCOUNTER — Telehealth: Payer: Self-pay | Admitting: Cardiovascular Disease

## 2020-01-22 NOTE — Telephone Encounter (Signed)
Called and spoke with pt, reviewed pharmacist's recommendations. Pt thankful for the call back and verbalized understanding. Pt would also like for Dr.Kelly's input on changing from Brilinta to another medication. She states that Brilinta is $400 oop and would like another less expensive but comparable to Brilinta.  Notified I would send Dr.Kelly a message for recommendations, but if Dr.Kelly would like for her to stay on the brilinta we may need to do a tier exception form. Pt verbalized understanding with no other questions at this time.

## 2020-01-22 NOTE — Telephone Encounter (Signed)
She will be fine without the atorvastatin.  Without the metoprolol and amlodipine she might see her BP elevated for a few days after getting back on them.  It's not ideal to be without the Brilinta, but her stents were 2 years ago, so as long as she doesn't make a habit of forgetting to take her meds, she should be fine.  Please have her take the Brilinta when she gets home tomorrow night, as well as any other evening doses.  Then just get back on her normal schedule Thursday.

## 2020-01-22 NOTE — Telephone Encounter (Signed)
Pt c/o medication issue:  1. Name of Medication:  atorvastatin (LIPITOR) 80 MG tablet metoprolol succinate (TOPROL-XL) 25 MG 24 hr tablet ticagrelor (BRILINTA) 90 MG TABS tablet amLODipine (NORVASC) 5 MG tablet  2. How are you currently taking this medication (dosage and times per day)? As directed   3. Are you having a reaction (difficulty breathing--STAT)? No   4. What is your medication issue? Pt said she went out of town and forgot her medications. She wanted to know if it will be OK if she does not take any of her medications until she comes home tomorrow night. Please advise

## 2020-01-24 MED ORDER — CLOPIDOGREL BISULFATE 75 MG PO TABS: 75.0000 mg | ORAL_TABLET | Freq: Every day | ORAL | 3 refills | Status: DC

## 2020-01-24 NOTE — Telephone Encounter (Signed)
Spoke with patient. Informed her we are changing Brilinta to Plavix 75mg  daily. New prescription sent to preferred pharmacy. Patient verbalized understanding.

## 2020-01-24 NOTE — Telephone Encounter (Signed)
Dr. Claiborne Billings will have to make this decision.  Patient had cath in 2019.  He's at the cath lab tomorrow - see if Minette Brine can get him to contact him then.

## 2020-01-24 NOTE — Telephone Encounter (Signed)
Patient following up on Brillinta change. Reports she will be out of medication on Sunday wanted to know if there was an alternative due to the cost. Will route to MD and pharmacy for recommendations.

## 2020-01-24 NOTE — Telephone Encounter (Signed)
Okay to DC Brilinta  and change to clopidogrel 75 mg daily 

## 2020-01-24 NOTE — Telephone Encounter (Signed)
Pt called back to follow up on this issue. She states she will have enough medication to lst her until Sunday but will take her last dose on Sunday morning.

## 2020-01-25 ENCOUNTER — Encounter: Payer: Self-pay | Admitting: Family Medicine

## 2020-01-25 ENCOUNTER — Other Ambulatory Visit: Payer: Self-pay

## 2020-01-25 ENCOUNTER — Ambulatory Visit (INDEPENDENT_AMBULATORY_CARE_PROVIDER_SITE_OTHER): Payer: Medicare HMO | Admitting: Family Medicine

## 2020-01-25 VITALS — BP 111/65 | HR 62 | Temp 98.7°F | Ht 62.0 in | Wt 144.0 lb

## 2020-01-25 DIAGNOSIS — G252 Other specified forms of tremor: Secondary | ICD-10-CM

## 2020-01-25 DIAGNOSIS — J4520 Mild intermittent asthma, uncomplicated: Secondary | ICD-10-CM

## 2020-01-25 DIAGNOSIS — R61 Generalized hyperhidrosis: Secondary | ICD-10-CM

## 2020-01-25 DIAGNOSIS — R2689 Other abnormalities of gait and mobility: Secondary | ICD-10-CM

## 2020-01-25 DIAGNOSIS — R7303 Prediabetes: Secondary | ICD-10-CM | POA: Diagnosis not present

## 2020-01-25 DIAGNOSIS — F411 Generalized anxiety disorder: Secondary | ICD-10-CM | POA: Diagnosis not present

## 2020-01-25 DIAGNOSIS — R0602 Shortness of breath: Secondary | ICD-10-CM | POA: Diagnosis not present

## 2020-01-25 DIAGNOSIS — G4700 Insomnia, unspecified: Secondary | ICD-10-CM

## 2020-01-25 MED ORDER — TRAZODONE HCL 50 MG PO TABS: 25.0000 mg | ORAL_TABLET | Freq: Every evening | ORAL | 2 refills | Status: DC | PRN

## 2020-01-25 MED ORDER — ALBUTEROL SULFATE HFA 108 (90 BASE) MCG/ACT IN AERS: 1.0000 | INHALATION_SPRAY | RESPIRATORY_TRACT | 0 refills | Status: DC | PRN

## 2020-01-25 MED ORDER — FLUOXETINE HCL 20 MG PO TABS: 40.0000 mg | ORAL_TABLET | Freq: Every day | ORAL | 0 refills | Status: DC

## 2020-01-25 NOTE — Progress Notes (Signed)
Subjective:  Patient ID: [name], female    DOB: 10-Jun-1951  Age: 69 y.o. MRN: [id number]  CC:  Chief Complaint  Patient presents with  . Medication Refill    on Prozac, trazodone, and albuterool. pt reports she takes her medications areprescribed. pt is worried she may be having some side effects to her medication. pt reports her hands shacking, night sweats, and so musscel spasems from time to time.   HPI [name] presents for   Multiple concerns as above.  Depression with anxiety: Currently taking Prozac 40 mg daily, trazodone 50 mg one half nightly as needed - every other night.  Feeling like meds working well for depression/anxiety.  Retired may 31st- previous work stress.  nervous with singing at times.   Hand tremor/balance.  Reports spasms in her muscles, hand trembling, past few months, notices with holding objects or intricate use of hands, bit hands more in R, R dominant, balance feels off at times - past few months. More careful with stairs. No falls.   night sweats at times - past few months once in awhile. No fever. No unexplained wt loss. No blood in stool, utd on colonoscopy- 03/2016.   Chronic shortness of breath with brilinta - has discussed with cardiology. Feels like it affects her breath control with singing. Transitioning brilinta to plavix this week d/t cost.  Lab Results  Component Value Date   TSH 1.390 10/03/2019   Last office visit in May 25, 2019.  Sed rate, CPK obtained at that time which were normal..  Various arthralgias, referred to orthopedic surgery. Some soreness in hip joints - treated with prednisone. Back less sore as not on computer. Tried glucosamine/chondroitin.   Prediabetes Labs obtained by cardiology, A1c 5.9 in February. Glucose 95 on 2/17.  Wt Readings from Last 3 Encounters:  01/25/20 144 lb (65.3 kg)  11/13/19 140 lb (63.5 kg)  05/25/19 141 lb 9.6 oz (64.2 kg)    Albuterol refill: Rare use of albuterol when  discussed in 2019.  Lungs are clear at that time.  RTC precautions if frequent use. Unknown last use, but expired.    History Patient Active Problem List   Diagnosis Date Noted  . Absolute anemia 06/13/2018  . Fatigue associated with anemia 06/13/2018  . Abnormal dreams 06/13/2018  . Depression 06/13/2018  . RLS (restless legs syndrome) 04/05/2018  . Atypical chest pain 03/10/2018  . CAD in native artery 03/10/2018  . Elevated blood pressure reading 03/10/2018  . Palpitations 03/10/2018  . Chest pain 03/09/2018  . Hyperlipidemia 03/08/2018  . Coronary artery disease involving native coronary artery of native heart with unstable angina pectoris ([place]) 03/07/2018  . Unstable angina ([place])   . Cough 01/02/2013  . Anxiety state 01/02/2013  . Breast cancer (DCIS), stage 0, Left, receptor +, dx 2012 09/08/2010   Past Medical History:  Diagnosis Date  . Allergy    "dust mite residue, mold, mildew, grapefruit, chili peppers, cat dander, some weeds" (03/07/2018)  . Anxiety   . Arthritis    "minor; right knee, left hip" (03/07/2018)  . Bruises easily   . Cancer of left breast ([place])    double mastectomy 01/26/2011  . Childhood asthma   . Contact lens/glasses fitting   . Coronary artery disease    a.  She underwent LHC 7/23 which revealed 95% stenosis of proximal LAD, managed with PCI/DES.   . Depression   . Essential hypertension   . GERD (gastroesophageal reflux disease)    "  at times" (03/07/2018)  . Heart murmur   . Hiatal hernia   . High cholesterol   . History of kidney stones    "lots; no OR" (03/07/2018)  . Osteoporosis   . Rectal bleeding    hemorroid  . Ringing in ears, bilateral    "since ~ 06/2017" (03/07/2018)  . Sinus drainage    Past Surgical History:  Procedure Laterality Date  . APPENDECTOMY    . BREAST LUMPECTOMY W/ NEEDLE LOCALIZATION Left 10/05/2010   lumpectomy  . BREAST SURGERY    . CORONARY STENT INTERVENTION N/A 03/07/2018   Procedure: CORONARY STENT  INTERVENTION;  Surgeon: Troy Sine, MD;  Location: [facility];  Service: Cardiovascular;  Laterality: N/A;  . LEFT HEART CATH AND CORONARY ANGIOGRAPHY N/A 03/07/2018   Procedure: LEFT HEART CATH AND CORONARY ANGIOGRAPHY;  Surgeon: Troy Sine, MD;  Location: [facility];  Service: Cardiovascular;  Laterality: N/A;  . MASTECTOMY Right 01/26/2011  . MASTECTOMY COMPLETE / SIMPLE W/ SENTINEL NODE BIOPSY Left 01/26/2011  . OVARIAN CYST SURGERY  1997, 2001, 2008   Allergies  Allergen Reactions  . Other     Grapefruit  . Elavil [Amitriptyline Hcl] Rash  . Sulfur Rash   Prior to Admission medications   Medication Sig Start Date End Date Taking? Authorizing Provider  albuterol (PROVENTIL HFA;VENTOLIN HFA) 108 (90 Base) MCG/ACT inhaler Inhale 1-2 puffs into the lungs every 4 (four) hours as needed for wheezing or shortness of breath. 05/16/18  Yes [name], MD  alendronate (FOSAMAX) 70 MG tablet  05/18/19  Yes [provider]  amLODipine (NORVASC) 5 MG tablet TAKE 1 TABLET BY MOUTH EVERY DAY 10/16/19  Yes Troy Sine, MD  aspirin EC 81 MG tablet Take 1 tablet (81 mg total) by mouth daily. 02/27/18  Yes Troy Sine, MD  atorvastatin (LIPITOR) 80 MG tablet Take 0.5 tablets (40 mg total) by mouth daily at 6 PM. 04/02/19  Yes Kroeger, Lorelee Cover., PA-C  cetirizine (ZYRTEC) 10 MG tablet Take 10 mg by mouth daily as needed for allergies.    Yes [provider]  clopidogrel (PLAVIX) 75 MG tablet Take 1 tablet (75 mg total) by mouth daily. 01/24/20  Yes Troy Sine, MD  FLUoxetine (PROZAC) 20 MG tablet Take 2 tablets (40 mg total) by mouth daily. 12/04/19  Yes [name], MD  fluticasone (FLONASE) 50 MCG/ACT nasal spray Place 2 sprays into both nostrils daily. Patient taking differently: Place 2 sprays into both nostrils as needed.  08/11/18  Yes [name], MD  metoprolol succinate (TOPROL-XL) 25 MG 24 hr tablet TAKE 1/2 TABLET BY MOUTH EVERY  DAY 03/13/19  Yes Dunn, Dayna N, PA-C  nitroGLYCERIN (NITROSTAT) 0.4 MG SL tablet PLACE 1 TABLET (0.4 MG TOTAL) UNDER THE TONGUE EVERY 5 (FIVE) MINUTES AS NEEDED FOR CHEST PAIN. 08/21/19  Yes Troy Sine, MD  pantoprazole (PROTONIX) 40 MG tablet TAKE 1 TABLET BY MOUTH EVERY DAY 10/17/19  Yes Troy Sine, MD  Probiotic Product (ALIGN) 4 MG CAPS Take 1 capsule by mouth daily.   Yes [provider]  traZODone (DESYREL) 50 MG tablet Take 0.5 tablets (25 mg total) by mouth at bedtime as needed. for sleep 02/23/19  Yes [name], MD   Social History   Socioeconomic History  . Marital status: Divorced    Spouse name: Not on file  . Number of children: Not on file  . Years of education:  Not on file  . Highest education level: Bachelor's degree (e.g., BA, AB, BS)  Occupational History  . Not on file  Tobacco Use  . Smoking status: Never Smoker  . Smokeless tobacco: Never Used  Vaping Use  . Vaping Use: Never used  Substance and Sexual Activity  . Alcohol use: Yes    Comment: 03/07/2018 "probably once/month"  . Drug use: No  . Sexual activity: Not on file  Other Topics Concern  . Not on file  Social History Narrative   Lives at home alone   Right handed   Caffeine: 1/2 cup of coffee daily that has (1/2 & 1/2 caf)   Social Determinants of Health   Financial Resource Strain:   . Difficulty of Paying Living Expenses:   Food Insecurity:   . Worried About Charity fundraiser in the Last Year:   . Arboriculturist in the Last Year:   Transportation Needs:   . Film/video editor (Medical):   Marland Kitchen Lack of Transportation (Non-Medical):   Physical Activity:   . Days of Exercise per Week:   . Minutes of Exercise per Session:   Stress:   . Feeling of Stress :   Social Connections:   . Frequency of Communication with Friends and Family:   . Frequency of Social Gatherings with Friends and Family:   . Attends Religious Services:   . Active Member of Clubs or  Organizations:   . Attends Archivist Meetings:   Marland Kitchen Marital Status:   Intimate Partner Violence:   . Fear of Current or Ex-Partner:   . Emotionally Abused:   Marland Kitchen Physically Abused:   . Sexually Abused:     Review of Systems Per HPI.   Objective:   Vitals:   01/25/20 0917  BP: 111/65  Pulse: 62  Temp: 98.7 F (37.1 C)  TempSrc: Temporal  SpO2: 96%  Weight: 144 lb (65.3 kg)  Height: 5\' 2"  (1.575 m)     Physical Exam Vitals reviewed.  Constitutional:      Appearance: She is well-developed.  HENT:     Head: Normocephalic and atraumatic.  Eyes:     Conjunctiva/sclera: Conjunctivae normal.     Pupils: Pupils are equal, round, and reactive to light.  Neck:     Vascular: No carotid bruit.  Cardiovascular:     Rate and Rhythm: Normal rate and regular rhythm.     Heart sounds: Normal heart sounds.  Pulmonary:     Effort: Pulmonary effort is normal. No respiratory distress.     Breath sounds: Normal breath sounds. No wheezing or rales.  Abdominal:     Palpations: Abdomen is soft. There is no pulsatile mass.     Tenderness: There is no abdominal tenderness.  Skin:    General: Skin is warm and dry.  Neurological:     Mental Status: She is alert and oriented to person, place, and time.     GCS: GCS eye subscore is 4. GCS verbal subscore is 5. GCS motor subscore is 6.     Cranial Nerves: No cranial nerve deficit, dysarthria or facial asymmetry.     Sensory: No sensory deficit.     Motor: Tremor present. No weakness or pronator drift.     Coordination: Romberg sign negative. Coordination normal. Finger-Nose-Finger Test and Heel to [facility] Test normal.     Gait: Gait is intact.     Comments: Min tremor in hands at rest, with R hand across arm. Otherwise  not seen.   No pronator drift, negative Romberg.  Normal finger-to-nose, normal rapid alternating movements of hands on thighs, normal heel slide.  Normal gait.   Psychiatric:        Mood and Affect: Mood normal.          Behavior: Behavior normal.     Assessment & Plan:  CHENOA LUDDY is a 69 y.o. female . Anxiety state - Plan: FLUoxetine (PROZAC) 20 MG tablet  -Overall stable at 40 mg.  Continue same.  If well controlled, especially now that she has retired, consider 20 mg dosing.  Mild intermittent reactive airway disease without complication - Plan: albuterol (VENTOLIN HFA) 108 (90 Base) MCG/ACT inhaler  -Rare symptoms, albuterol refilled as previous expired  Insomnia, unspecified type - Plan: traZODone (DESYREL) 50 MG tablet  -Continue same.  Option of trial off meds or intermittent dosing as depression symptoms improved.  Prediabetes - Plan: Basic metabolic panel  -Watch diet/activity/exercise, repeat BMP.  Intention tremor - Plan: Ambulatory referral to Neurology Balance problem - Plan: Ambulatory referral to Neurology  -Minimal tremor on exam.  Overall gait appears normal, did not appreciate significant balance issues on in office testing.  Possible component of intention tremor versus resting although she did notice some symptoms as one arm is propped on the other.  Will refer to neurology for further testing.  May need to monitor over the next 6 months for changes.  Night sweats - Plan: Basic metabolic panel, CBC, Sedimentation Rate  -Afebrile, intermittent symptoms.  Check sed rate, CBC, BMP, follow-up to discuss further.  Shortness of breath  -Reports chronic symptoms with use of Brilinta.  With her cardiac history recommend she initially discussed with cardiology, but if not thought to be cardiac, consider pulmonary eval or pulmonary function testing.  RTC/ER precautions.  Recheck 6 weeks  Meds ordered this encounter  Medications  . albuterol (VENTOLIN HFA) 108 (90 Base) MCG/ACT inhaler    Sig: Inhale 1-2 puffs into the lungs every 4 (four) hours as needed for wheezing or shortness of breath.    Dispense:  18 g    Refill:  0  . FLUoxetine (PROZAC) 20 MG tablet    Sig: Take 2  tablets (40 mg total) by mouth daily.    Dispense:  60 tablet    Refill:  0    This is her second curtesy refill, must have office visit  . traZODone (DESYREL) 50 MG tablet    Sig: Take 0.5 tablets (25 mg total) by mouth at bedtime as needed. for sleep    Dispense:  30 tablet    Refill:  2   Patient Instructions    Albuterol refilled if needed - if you require that more often - return to discuss further.  Glucosamine ok as a trial if ok with cardiology. If that is not helping hip pain, then I recommend follow up with orthopaedics.   I recommend following up with Dr. Claiborne Billings to discuss the shortness of breath, but can discuss further next visit.   Very minimal tremor seen in the office today but I will refer you to neurology to discuss his symptoms further, repeat exam and to discuss the balance issues.    Make sure to drink plenty of fluids throughout the day to stay hydrated.  Recheck in 6 weeks, sooner if any worsening symptoms. I am checking some blood work as well for the night sweats, but if those worsen please be seen sooner.  Okay to stay  at the same dose of fluoxetine for now to see if that may be helpful for possible component of performance anxiety was seen.  If you feel like that has improved and depression/anxiety is improved, can decrease down to 20 mg a day of Prozac.    If you have lab work done today you will be contacted with your lab results within the next 2 weeks.  If you have not heard from [place] then please contact us. The fastest way to get your results is to register for My Chart.   IF you received an x-ray today, you will receive an invoice from [facility]. Please contact [facility] at [phone] with questions or concerns regarding your invoice.   IF you received labwork today, you will receive an invoice from [facility]. Please contact [facility] at [phone] with questions or concerns regarding your invoice.   Our billing staff will not be able  to assist you with questions regarding bills from these companies.  You will be contacted with the lab results as soon as they are available. The fastest way to get your results is to activate your My Chart account. Instructions are located on the last page of this paperwork. If you have not heard from [place] regarding the results in 2 weeks, please contact this office.         Signed, [name], MD [facility]

## 2020-01-25 NOTE — Patient Instructions (Addendum)
°  Albuterol refilled if needed - if you require that more often - return to discuss further.  Glucosamine ok as a trial if ok with cardiology. If that is not helping hip pain, then I recommend follow up with orthopaedics.   I recommend following up with Dr. Claiborne Billings to discuss the shortness of breath, but can discuss further next visit.   Very minimal tremor seen in the office today but I will refer you to neurology to discuss his symptoms further, repeat exam and to discuss the balance issues.    Make sure to drink plenty of fluids throughout the day to stay hydrated.  Recheck in 6 weeks, sooner if any worsening symptoms. I am checking some blood work as well for the night sweats, but if those worsen please be seen sooner.  Okay to stay at the same dose of fluoxetine for now to see if that may be helpful for possible component of performance anxiety was seen.  If you feel like that has improved and depression/anxiety is improved, can decrease down to 20 mg a day of Prozac.    If you have lab work done today you will be contacted with your lab results within the next 2 weeks.  If you have not heard from [place] then please contact us. The fastest way to get your results is to register for My Chart.   IF you received an x-ray today, you will receive an invoice from [facility]. Please contact [facility] at [phone] with questions or concerns regarding your invoice.   IF you received labwork today, you will receive an invoice from [facility]. Please contact [facility] at [phone] with questions or concerns regarding your invoice.   Our billing staff will not be able to assist you with questions regarding bills from these companies.  You will be contacted with the lab results as soon as they are available. The fastest way to get your results is to activate your My Chart account. Instructions are located on the last page of this paperwork. If you have not heard from [place] regarding the  results in 2 weeks, please contact this office.

## 2020-01-26 ENCOUNTER — Encounter: Payer: Self-pay | Admitting: Family Medicine

## 2020-01-26 LAB — CBC
Hematocrit: 41.7 % (ref 34.0–46.6)
Hemoglobin: 13.6 g/dL (ref 11.1–15.9)
MCH: 31.3 pg (ref 26.6–33.0)
MCHC: 32.6 g/dL (ref 31.5–35.7)
MCV: 96 fL (ref 79–97)
Platelets: 221 10*3/uL (ref 150–450)
RBC: 4.34 x10E6/uL (ref 3.77–5.28)
RDW: 13.1 % (ref 11.7–15.4)
WBC: 5.7 10*3/uL (ref 3.4–10.8)

## 2020-01-26 LAB — BASIC METABOLIC PANEL
BUN/Creatinine Ratio: 12 (ref 12–28)
BUN: 11 mg/dL (ref 8–27)
CO2: 24 mmol/L (ref 20–29)
Calcium: 10.3 mg/dL (ref 8.7–10.3)
Chloride: 104 mmol/L (ref 96–106)
Creatinine, Ser: 0.9 mg/dL (ref 0.57–1.00)
GFR calc Af Amer: 76 mL/min/{1.73_m2} (ref >59)
GFR calc non Af Amer: 66 mL/min/{1.73_m2} (ref >59)
Glucose: 95 mg/dL (ref 65–99)
Potassium: 4.6 mmol/L (ref 3.5–5.2)
Sodium: 142 mmol/L (ref 134–144)

## 2020-01-26 LAB — SEDIMENTATION RATE: Sed Rate: 2 mm/hr (ref 0–40)

## 2020-01-28 ENCOUNTER — Encounter: Payer: Self-pay | Admitting: Family Medicine

## 2020-01-28 DIAGNOSIS — F411 Generalized anxiety disorder: Secondary | ICD-10-CM

## 2020-01-28 NOTE — Telephone Encounter (Signed)
Sent to me by error.

## 2020-01-29 MED ORDER — FLUOXETINE HCL 40 MG PO CAPS: 40.0000 mg | ORAL_CAPSULE | Freq: Every day | ORAL | 1 refills | Status: DC

## 2020-01-29 NOTE — Telephone Encounter (Signed)
New rx sent. Will send message re: labs to patient.

## 2020-02-07 ENCOUNTER — Other Ambulatory Visit: Payer: Self-pay

## 2020-02-07 MED ORDER — METOPROLOL SUCCINATE ER 25 MG PO TB24: 12.5000 mg | ORAL_TABLET | Freq: Every day | ORAL | 2 refills | Status: DC

## 2020-02-07 MED ORDER — ATORVASTATIN CALCIUM 80 MG PO TABS: 40.0000 mg | ORAL_TABLET | Freq: Every day | ORAL | 1 refills | Status: DC

## 2020-02-13 ENCOUNTER — Telehealth: Payer: Self-pay | Admitting: Cardiovascular Disease

## 2020-02-13 ENCOUNTER — Other Ambulatory Visit: Payer: Self-pay | Admitting: Family Medicine

## 2020-02-13 DIAGNOSIS — G4700 Insomnia, unspecified: Secondary | ICD-10-CM

## 2020-02-13 MED ORDER — CLOPIDOGREL BISULFATE 75 MG PO TABS: 75.0000 mg | ORAL_TABLET | Freq: Every day | ORAL | 0 refills | Status: DC

## 2020-02-13 MED ORDER — TRAZODONE HCL 50 MG PO TABS: 25.0000 mg | ORAL_TABLET | Freq: Every evening | ORAL | 2 refills | Status: DC | PRN

## 2020-02-13 MED ORDER — AMLODIPINE BESYLATE 5 MG PO TABS: 5.0000 mg | ORAL_TABLET | Freq: Every day | ORAL | 0 refills | Status: DC

## 2020-02-13 MED ORDER — METOPROLOL SUCCINATE ER 25 MG PO TB24: 12.5000 mg | ORAL_TABLET | Freq: Every day | ORAL | 0 refills | Status: DC

## 2020-02-13 MED ORDER — FLUOXETINE HCL 40 MG PO CAPS: 40.0000 mg | ORAL_CAPSULE | Freq: Every day | ORAL | 1 refills | Status: DC

## 2020-02-13 MED ORDER — ATORVASTATIN CALCIUM 80 MG PO TABS: 40.0000 mg | ORAL_TABLET | Freq: Every day | ORAL | 0 refills | Status: DC

## 2020-02-13 NOTE — Telephone Encounter (Signed)
New message     *STAT* If patient is at the pharmacy, call can be transferred to refill team.   1. Which medications need to be refilled? (please list name of each medication and dose if known)amLODipine (NORVASC) 5 MG tablet  clopidogrel (PLAVIX) 75 MG tablet   atorvastatin (LIPITOR) 80 MG tablet  metoprolol succinate (TOPROL-XL) 25 MG 24 hr tablet  2. Which pharmacy/location (including street and city if local pharmacy) is medication to be sent to?[place], [place]  3. Do they need a 30 day or 90 day supply? [place]

## 2020-02-13 NOTE — Telephone Encounter (Signed)
Copied from [place] [id number]. Topic: Quick Communication - Rx Refill/Question >> Feb 13, 2020  9:24 AM Leward Quan A wrote: Medication: traZODone (DESYREL) 50 MG tablet, FLUoxetine (PROZAC) 40 MG capsule    Has the patient contacted their pharmacy? Yes.   (Agent: If no, request that the patient contact the pharmacy for the refill.) (Agent: If yes, when and what did the pharmacy advise?)  Preferred Pharmacy (with phone number or street name): [facility], [place]  Phone:  [phone] Fax:  [phone]     Agent: Please be advised that RX refills may take up to 3 business days. We ask that you follow-up with your pharmacy.

## 2020-02-13 NOTE — Telephone Encounter (Signed)
Rx(s) sent to pharmacy electronically.  

## 2020-02-25 ENCOUNTER — Other Ambulatory Visit: Payer: Self-pay

## 2020-02-25 DIAGNOSIS — J309 Allergic rhinitis, unspecified: Secondary | ICD-10-CM

## 2020-02-25 MED ORDER — FLUTICASONE PROPIONATE 50 MCG/ACT NA SUSP: 2.0000 | NASAL | 6 refills | Status: DC | PRN

## 2020-02-25 MED ORDER — AMLODIPINE BESYLATE 5 MG PO TABS: 5.0000 mg | ORAL_TABLET | Freq: Every day | ORAL | 0 refills | Status: DC

## 2020-03-04 ENCOUNTER — Other Ambulatory Visit: Payer: Self-pay

## 2020-03-10 ENCOUNTER — Ambulatory Visit: Payer: Medicare Other | Admitting: Family Medicine

## 2020-04-01 ENCOUNTER — Ambulatory Visit: Payer: Medicare HMO | Admitting: Neurology

## 2020-04-04 ENCOUNTER — Encounter: Payer: Self-pay | Admitting: Neurology

## 2020-04-04 ENCOUNTER — Ambulatory Visit (INDEPENDENT_AMBULATORY_CARE_PROVIDER_SITE_OTHER): Payer: Medicare HMO | Admitting: Neurology

## 2020-04-04 ENCOUNTER — Telehealth: Payer: Self-pay | Admitting: Neurology

## 2020-04-04 VITALS — BP 106/62 | HR 67 | Ht 62.0 in | Wt 146.0 lb

## 2020-04-04 DIAGNOSIS — R251 Tremor, unspecified: Secondary | ICD-10-CM

## 2020-04-04 DIAGNOSIS — R2689 Other abnormalities of gait and mobility: Secondary | ICD-10-CM

## 2020-04-04 DIAGNOSIS — R252 Cramp and spasm: Secondary | ICD-10-CM

## 2020-04-04 MED ORDER — FLUOXETINE HCL 10 MG PO CAPS: ORAL_CAPSULE | ORAL | 0 refills | Status: DC

## 2020-04-04 NOTE — Patient Instructions (Addendum)
May be medication effects vs essential tremor Decrease prozac (see prescription) Will check thyroid today Balance exercises, can also send to PT for this is you like Follow up as needed  Essential Tremor A tremor is trembling or shaking that a person cannot control. Most tremors affect the hands or arms. Tremors can also affect the head, vocal cords, legs, and other parts of the body. Essential tremor is a tremor without a known cause. Usually, it occurs while a person is trying to perform an action. It tends to get worse gradually as a person ages. What are the causes? The cause of this condition is not known. What increases the risk? You are more likely to develop this condition if:  You have a family member with essential tremor.  You are age 69 or older.  You take certain medicines. What are the signs or symptoms? The main sign of a tremor is a rhythmic shaking of certain parts of your body that is uncontrolled and unintentional. You may:  Have difficulty eating with a spoon or fork.  Have difficulty writing.  Nod your head up and down or side to side.  Have a quivering voice. The shaking may:  Get worse over time.  Come and go.  Be more noticeable on one side of your body.  Get worse due to stress, fatigue, caffeine, and extreme heat or cold. How is this diagnosed? This condition may be diagnosed based on:  Your symptoms and medical history.  A physical exam. There is no single test to diagnose an essential tremor. However, your health care provider may order tests to rule out other causes of your condition. These may include:  Blood and urine tests.  Imaging studies of your brain, such as CT scan and MRI.  A test that measures involuntary muscle movement (electromyogram). How is this treated? Treatment for essential tremor depends on the severity of the condition.  Some tremors may go away without treatment.  Mild tremors may not need treatment if they do  not affect your day-to-day life.  Severe tremors may need to be treated using one or more of the following options: ? Medicines. ? Lifestyle changes. ? Occupational or physical therapy. Follow these instructions at home: Lifestyle   Do not use any products that contain nicotine or tobacco, such as cigarettes and e-cigarettes. If you need help quitting, ask your health care provider.  Limit your caffeine intake as told by your health care provider.  Try to get 8 hours of sleep each night.  Find ways to manage your stress that fits your lifestyle and personality. Consider trying meditation or yoga.  Try to anticipate stressful situations and allow extra time to manage them.  If you are struggling emotionally with the effects of your tremor, consider working with a mental health provider. General instructions  Take over-the-counter and prescription medicines only as told by your health care provider.  Avoid extreme heat and extreme cold.  Keep all follow-up visits as told by your health care provider. This is important. Visits may include physical therapy visits. Contact a health care provider if:  You experience any changes in the location or intensity of your tremors.  You start having a tremor after starting a new medicine.  You have tremor with other symptoms, such as: ? Numbness. ? Tingling. ? Pain. ? Weakness.  Your tremor gets worse.  Your tremor interferes with your daily life.  You feel down, blue, or sad for at least 2 weeks in  a row.  Worrying about your tremor and what other people think about you interferes with your everyday life functions, including relationships, work, or school. Summary  Essential tremor is a tremor without a known cause. Usually, it occurs when you are trying to perform an action.  The cause of this condition is not known.  The main sign of a tremor is a rhythmic shaking of certain parts of your body that is uncontrolled and  unintentional.  Treatment for essential tremor depends on the severity of the condition. This information is not intended to replace advice given to you by your health care provider. Make sure you discuss any questions you have with your health care provider. Document Revised: 08/12/2017 Document Reviewed: 08/12/2017 Elsevier Patient Education  2020 [employer].

## 2020-04-04 NOTE — Progress Notes (Addendum)
[id number]    Provider:  Dr Jaynee Eagles Requesting Provider: [name], MD Primary Care Provider:  [name], MD  CC:  Tremor and off balance  HPI:  [name] is a 69 y.o. female here as requested by [name], MD for "intention tremor, balance problems". PMHx coronary artery disease, anxiety, elevated blood pressure, hyperlipidemia, restless leg syndrome, depression, fatigue associated with anemia.  I reviewed Dr. Rolly Salter notes: Patient was last seen in June and reported spasms in her muscles, hands trembling over several months, noticing more with holding objects or intricate use of hands, balance feels off, sed rate and CK were normal in the past, also reported various arthralgias in the past  She has been noticing shaking in her hands, going on well over 6 months, mild, more with fine motor skills and also positionally, not at rest. She feels internal shaking all over once. She tries to avoid caffeine. No FHx of tremor. She can be sitting and she can have some muscle spasms happened a few times. She feels unsteady sometimes on her feet, balance, not lightheadedness, she hardly ever uses her albuterol. No changes in smell or taste. No resting tremor.  she had one mechanical fall tripping over a vaccuum but otherwise no falls, smell and taste is fine, sleeping well with trazodone (taking it 6-12 months", tremor stable. Symmetric shaking discrete incidents, daily, stable, no pain or sensory changes. No other focal neurologic deficits, associated symptoms, inciting events or modifiable factors.   Reviewed notes, labs and imaging from outside physicians, which showed:  I reviewed sleep study results which showed no apnea, it did show periodic limb movements of sleep, patient had 122 periodic limb movements, the periodic limb movement index was 18.3 and the PLM arousal index was 7.4 an hour, few arousals were noted as 9 were spontaneous, 49 were associated  with PLM's and one was associated with respiratory event, no evidence of sleep apnea, her ferritin was low and she was advised in the past to start iron which would help her PLM's as well as restless leg syndrome.  01/2018: Personally reviewed MRI of the brain which showed moderate chronic small vessel ischemic disease.  No atrophy more than for age.  No acute or remote strokes or other events.  01/2018 MRA head: reviewed images , negative  Labs from January 25, 2020 showed normal sed rate, CBC and BMP.  Thyroid from February 2021 normal.  Hemoglobin A1c 5.9 from February 2021.  Review of Systems: Patient complains of symptoms per HPI as well as the following symptoms:tremor, muscle cramps. Pertinent negatives and positives per HPI. All others negative.   Social History   Socioeconomic History  . Marital status: Divorced    Spouse name: Not on file  . Number of children: Not on file  . Years of education: Not on file  . Highest education level: Bachelor's degree (e.g., BA, AB, BS)  Occupational History  . Not on file  Tobacco Use  . Smoking status: Never Smoker  . Smokeless tobacco: Never Used  Vaping Use  . Vaping Use: Never used  Substance and Sexual Activity  . Alcohol use: Yes    Comment: 03/07/2018 "probably once/month"  . Drug use: No  . Sexual activity: Not on file  Other Topics Concern  . Not on file  Social History Narrative   Lives at home alone   Right handed   Caffeine: 1/2 cup of coffee daily that has (1/2 & 1/2  caf)   Social Determinants of Health   Financial Resource Strain:   . Difficulty of Paying Living Expenses: Not on file  Food Insecurity:   . Worried About Charity fundraiser in the Last Year: Not on file  . Ran Out of Food in the Last Year: Not on file  Transportation Needs:   . Lack of Transportation (Medical): Not on file  . Lack of Transportation (Non-Medical): Not on file  Physical Activity:   . Days of Exercise per Week: Not on file  . Minutes  of Exercise per Session: Not on file  Stress:   . Feeling of Stress : Not on file  Social Connections:   . Frequency of Communication with Friends and Family: Not on file  . Frequency of Social Gatherings with Friends and Family: Not on file  . Attends Religious Services: Not on file  . Active Member of Clubs or Organizations: Not on file  . Attends Archivist Meetings: Not on file  . Marital Status: Not on file  Intimate Partner Violence:   . Fear of Current or Ex-Partner: Not on file  . Emotionally Abused: Not on file  . Physically Abused: Not on file  . Sexually Abused: Not on file    Family History  Problem Relation Age of Onset  . Hypertension Mother   . Heart Problems Mother   . Hyperlipidemia Mother   . Hypotension Father   . Other Father        pacemaker  . Heart failure Paternal Grandmother   . Heart failure Paternal Grandfather   . Cancer Brother   . Cancer Sister   . Diabetes Sister     Past Medical History:  Diagnosis Date  . Allergy    "dust mite residue, mold, mildew, grapefruit, chili peppers, cat dander, some weeds" (03/07/2018)  . Anxiety   . Arthritis    "minor; right knee, left hip" (03/07/2018)  . Bruises easily   . Cancer of left breast ([place])    double mastectomy 01/26/2011  . Childhood asthma   . Contact lens/glasses fitting   . Coronary artery disease    a.  She underwent LHC 7/23 which revealed 95% stenosis of proximal LAD, managed with PCI/DES.   . Depression   . Essential hypertension   . GERD (gastroesophageal reflux disease)    "at times" (03/07/2018)  . Heart murmur   . Hiatal hernia   . High cholesterol   . History of kidney stones    "lots; no OR" (03/07/2018)  . Osteoporosis   . Rectal bleeding    hemorroid  . Ringing in ears, bilateral    "since ~ 06/2017" (03/07/2018)  . Sinus drainage     Patient Active Problem List   Diagnosis Date Noted  . Absolute anemia 06/13/2018  . Fatigue associated with anemia 06/13/2018   . Abnormal dreams 06/13/2018  . Depression 06/13/2018  . RLS (restless legs syndrome) 04/05/2018  . Atypical chest pain 03/10/2018  . CAD in native artery 03/10/2018  . Elevated blood pressure reading 03/10/2018  . Palpitations 03/10/2018  . Chest pain 03/09/2018  . Hyperlipidemia 03/08/2018  . Coronary artery disease involving native coronary artery of native heart with unstable angina pectoris ([place]) 03/07/2018  . Unstable angina ([place])   . Cough 01/02/2013  . Anxiety state 01/02/2013  . Breast cancer (DCIS), stage 0, Left, receptor +, dx 2012 09/08/2010    Past Surgical History:  Procedure Laterality Date  . APPENDECTOMY    .  BREAST LUMPECTOMY W/ NEEDLE LOCALIZATION Left 10/05/2010   lumpectomy  . BREAST SURGERY    . CORONARY STENT INTERVENTION N/A 03/07/2018   Procedure: CORONARY STENT INTERVENTION;  Surgeon: Troy Sine, MD;  Location: [facility];  Service: Cardiovascular;  Laterality: N/A;  . LEFT HEART CATH AND CORONARY ANGIOGRAPHY N/A 03/07/2018   Procedure: LEFT HEART CATH AND CORONARY ANGIOGRAPHY;  Surgeon: Troy Sine, MD;  Location: [facility];  Service: Cardiovascular;  Laterality: N/A;  . MASTECTOMY Right 01/26/2011  . MASTECTOMY COMPLETE / SIMPLE W/ SENTINEL NODE BIOPSY Left 01/26/2011  . OVARIAN CYST SURGERY  1997, 2001, 2008    Current Outpatient Medications  Medication Sig Dispense Refill  . albuterol (VENTOLIN HFA) 108 (90 Base) MCG/ACT inhaler Inhale 1-2 puffs into the lungs every 4 (four) hours as needed for wheezing or shortness of breath. 18 g 0  . alendronate (FOSAMAX) 70 MG tablet     . amLODipine (NORVASC) 5 MG tablet Take 1 tablet (5 mg total) by mouth daily. Please call to schedule an appointment with Dr Claiborne Billings in August 2021. 60 tablet 0  . aspirin EC 81 MG tablet Take 1 tablet (81 mg total) by mouth daily. 90 tablet 3  . atorvastatin (LIPITOR) 80 MG tablet Take 0.5 tablets (40 mg total) by mouth daily at 6 PM. Please call to  schedule an appointment with Dr Claiborne Billings in August 2021. 90 tablet 0  . cetirizine (ZYRTEC) 10 MG tablet Take 10 mg by mouth daily as needed for allergies.     Marland Kitchen clopidogrel (PLAVIX) 75 MG tablet Take 1 tablet (75 mg total) by mouth daily. Please call to schedule an appointment with Dr Claiborne Billings in August 2021. 90 tablet 0  . fluticasone (FLONASE) 50 MCG/ACT nasal spray Place 2 sprays into both nostrils as needed. 16 g 6  . metoprolol succinate (TOPROL-XL) 25 MG 24 hr tablet Take 0.5 tablets (12.5 mg total) by mouth daily. Please call to schedule an appointment with Dr Claiborne Billings in August 2021. 45 tablet 0  . nitroGLYCERIN (NITROSTAT) 0.4 MG SL tablet PLACE 1 TABLET (0.4 MG TOTAL) UNDER THE TONGUE EVERY 5 (FIVE) MINUTES AS NEEDED FOR CHEST PAIN. 75 tablet 3  . pantoprazole (PROTONIX) 40 MG tablet TAKE 1 TABLET BY MOUTH EVERY DAY 30 tablet 4  . Probiotic Product (ALIGN) 4 MG CAPS Take 1 capsule by mouth daily.    . traZODone (DESYREL) 50 MG tablet Take 0.5 tablets (25 mg total) by mouth at bedtime as needed. for sleep 30 tablet 2  . FLUoxetine (PROZAC) 10 MG capsule Start with 30mg  for 1-2weeks, then decrease to 20mg  for 1-2 weeks, then 10mg  for 1-2 weeks and then stop. 84 capsule 0   No current facility-administered medications for this visit.    Allergies as of 04/04/2020 - Review Complete 04/04/2020  Allergen Reaction Noted  . Other  11/13/2019  . Elavil [amitriptyline hcl] Rash 02/09/2011  . Sulfur Rash 02/09/2011    Vitals: BP 106/62   Pulse 67   Ht 5\' 2"  (1.575 m)   Wt 146 lb (66.2 kg)   LMP 09/10/2006   BMI 26.70 kg/m  Last Weight:  Wt Readings from Last 1 Encounters:  04/04/20 146 lb (66.2 kg)   Last Height:   Ht Readings from Last 1 Encounters:  04/04/20 5\' 2"  (1.575 m)     Physical exam: Exam: Gen: NAD, conversant, well nourised, well groomed  CV: RRR, no MRG. No Carotid Bruits. No peripheral edema, warm, nontender Eyes: Conjunctivae clear without exudates  or hemorrhage  Neuro: Detailed Neurologic Exam  Speech:    Speech is normal; fluent and spontaneous with normal comprehension.  Cognition:    The patient is oriented to person, place, and time;     recent and remote memory intact;     language fluent;     normal attention, concentration,     fund of knowledge Cranial Nerves:    The pupils are equal, round, and reactive to light. The fundi are normal and flat Visual fields are full to finger confrontation. Extraocular movements are intact. Trigeminal sensation is intact and the muscles of mastication are normal. The face is symmetric. The palate elevates in the midline. Hearing intact. Voice is normal. Shoulder shrug is normal. The tongue has normal motion without fasciculations.   Coordination:    Normal finger to nose   Gait:    Heel-toe and tandem gait are normal.   Motor Observation:    No asymmetry, no atrophy, very mild almost imperceptible postural tremor Tone:    Normal muscle tone.    Posture:    Posture is normal. normal erect    Strength:    Strength is V/V in the upper and lower limbs.      Sensation: intact to LT     Reflex Exam:  DTR's:    Deep tendon reflexes in the upper and lower extremities are normal bilaterally.   Toes:    The toes are downgoing bilaterally.   Clonus:    Clonus is absent.    Assessment/Plan:  69 year old with complaints of tremor. May be medication effects vs essential tremor. Neurologic exam is normal, very minimal postural tremor.   Patient is on multiple medications that can cause tremor including Prozac, trazodone and albuterol. May also be essential tremor hard to say, discussed with patient. She would like to decrease her Prozac, she is feeling a lot better in life than when she started it,will slowly decrease and see how she does. If she has worsening mood then go back to prior dose. Follow with Dr. Carlota Raspberry.  Patient can heel and toe and tandem, I recommend balance exercises,  I do not notice anything concerning with her balance, not parkinsonian. Can also send to PT for balance exercises.  A few times had muscles contract, nothing concerning, tried to reassure patient who appears to have some anxiety about multiple benign symptoms. After the appointment I received a message that she would like me to call her to discuss on the phone some other things she remembered, I asked her to email me about it.   Follow up as needed  .   Orders Placed This Encounter  Procedures  . TSH  . T4, Free   Meds ordered this encounter  Medications  . FLUoxetine (PROZAC) 10 MG capsule    Sig: Start with 30mg  for 1-2weeks, then decrease to 20mg  for 1-2 weeks, then 10mg  for 1-2 weeks and then stop.    Dispense:  84 capsule    Refill:  0    Cc: [name], MD  Sarina Ill, MD  [facility] [address] [place], [place] [address]  Phone [phone] Fax [phone]  I spent more than minutes of face-to-face and non-face-to-face time with patient on the  1. Tremor   2. Imbalance   3. Muscle cramp    diagnosis.  This included previsit chart  review, lab review, study review, order entry, electronic health record documentation, patient education on the different diagnostic and therapeutic options, counseling and coordination of care, risks and benefits of management, compliance, or risk factor reduction

## 2020-04-04 NOTE — Telephone Encounter (Signed)
Dr. Jaynee Eagles, Patient would like you to call her she forgot to mention something at her visit this morning. She wanted to mention a symptom that she didn't discuss during the appointment. Can you please call her on her mobile. Thank you

## 2020-04-04 NOTE — Telephone Encounter (Signed)
Selinda Eon I cannot, I have patients all day long, please ask her to email me via mychart thanks

## 2020-04-04 NOTE — Telephone Encounter (Signed)
Called Patient and advised to reach out to Dr. Jaynee Eagles through [place] which she will do.

## 2020-04-05 LAB — TSH: TSH: 1.5 u[IU]/mL (ref 0.450–4.500)

## 2020-04-05 LAB — T4, FREE: Free T4: 1.11 ng/dL (ref 0.82–1.77)

## 2020-04-06 NOTE — Progress Notes (Signed)
Virtual Visit via Telephone Note   This visit type was conducted due to national recommendations for restrictions regarding the COVID-19 Pandemic (e.g. social distancing) in an effort to limit this patient's exposure and mitigate transmission in our community.  Due to her co-morbid illnesses, this patient is at least at moderate risk for complications without adequate follow up.  This format is felt to be most appropriate for this patient at this time.  The patient did not have access to video technology/had technical difficulties with video requiring transitioning to audio format only (telephone).  All issues noted in this document were discussed and addressed.  No physical exam could be performed with this format.  Please refer to the patient's chart for her  consent to telehealth for [facility].  Evaluation Performed:  Follow-up visit  This visit type was conducted due to national recommendations for restrictions regarding the COVID-19 Pandemic (e.g. social distancing).  This format is felt to be most appropriate for this patient at this time.  All issues noted in this document were discussed and addressed.  No physical exam was performed (except for noted visual exam findings with Video Visits).  Please refer to the patient's chart (MyChart message for video visits and phone note for telephone visits) for the patient's consent to telehealth for [place]  Date:  04/07/2020   ID:  [name], DOB 10-29-1950, MRN [id number]  Patient Location:  [address] [place] [address]   Provider location:     [address] Office [phone] Fax [phone]   PCP:  [name], MD  Cardiologist:  Shelva Majestic, MD  Electrophysiologist:  None   Chief Complaint: Coronary artery disease follow-up  History of Present Illness:    [name] is a 69 y.o. female who presents via audio/video  conferencing for a telehealth visit today.  Patient verified DOB and address.  She has a past medical history of coronary artery disease status post PCI with DES to LAD 03/07/2018, essential hypertension, hyperlipidemia, anxiety, and palpitations.  She was also told that she had a heart murmur as a child and had childhood asthma.  She underwent double mastectomy for breast cancer 2012.  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  Previously she indicated that her new job created increased stress.  She was initially seen by Dr. Claiborne Billings on 12/15/2017 for recurrent chest pain and jaw pain.  She underwent coronary CTA 5/19 which showed a calcium score of 6.  Mild calcium was noted in the left main and mid LAD.  Her left main was noted to have less than 20% stenosis, LAD 50-75% mixed plaque.  Normal circumflex and RCA.  Her FFR was positive for LAD territory of 0.5.  Her chest CT showed a nonspecific 2 mm in left upper lobe nodule that was felt to be benign.  She underwent cardiac catheterization 03/07/2018.  She was found to have single-vessel CAD and 95% fibrotic appearing mid LAD.  She underwent successful PCI with DES x1.  Subsequently she was readmitted to the hospital several days later with recurrent chest pain, palpitations, and hypertension.  Her pain was felt to be related to her back and anxiety.  She took 26 weeks off work for medical leave due to her anxiety.  She then returned to work.  She was last seen by Dr. Claiborne Billings on 04/16/2019.  During that time she again indicated she had stress  with work.  She had developed left-sided jaw pain and some discomfort in her mid upper back.  She denied exertional chest discomfort but was concerned it was related to cardiac etiology.  She did specifically deny exertional chest pain.  Her EKG showed no significant abnormalities/acute changes.  Blood pressure was 126/60.  Her amlodipine was increased back to 5 mg.  She was also started on Protonix 40 mg.  She was  instructed to contact cardiology clinic with continued symptoms of chest discomfort.  She presents the clinic today for follow-up evaluation states she has been meeting with neurology for essential tremor and is weaning her Prozac.  She states that she is also trying to decrease her trazodone medication.  She states that her essential tremor is affecting her fine motor movements and her singing.  We discussed possibility of propranolol as needed in the future.  I will let Dr. Lavell Anchors complete follow-up evaluation before prescribing.  She also states that she has been having some night sweating and wonders if it is related to medication.  We discussed that since her medications have not been adjusted over a year is probably not related to cardiology medication.  I instructed her to follow-up with her PCP for further evaluation if sweating at bedtime continues.  She expressed understanding.  She has been trying to increase her physical activity.  I will have her follow-up with Dr. Claiborne Billings in 6 months and provide her with the salty 6 diet sheet.  Today denies chest pain, shortness of breath, lower extremity edema, fatigue, palpitations, melena, hematuria, hemoptysis,  weakness, presyncope, syncope, orthopnea, and PND.    The patient does not symptoms concerning for COVID-19 infection (fever, chills, cough, or new SHORTNESS OF BREATH).    Prior CV studies:   The following studies were reviewed today:  Echocardiogram 01/02/2018 Study Conclusions   - Left ventricle: The cavity size was normal. Wall thickness was  normal. Systolic function was normal. The estimated ejection  fraction was in the range of 55% to 60%. Wall motion was normal;  there were no regional wall motion abnormalities. Features are  consistent with a pseudonormal left ventricular filling pattern,  with concomitant abnormal relaxation and increased filling  pressure (grade 2 diastolic dysfunction).  - Aortic valve: Valve  mobility was restricted. There was mild  regurgitation.   Impressions:   - Normal LV systolic function; moderate diastolic dysfunction;  sclerotic aortic valve with mild AI.  Cardiac catheterization 03/07/2018  Prox LAD lesion is 95% stenosed.  Post intervention, there is a 0% residual stenosis.  A stent was successfully placed.   Single-vessel coronary obstructive disease with a 95% fibrotic appearing mid LAD stenosis distal to a proximal septal and diagonal vessel.  Normal left circumflex and dominant RCA.  LVEDP 21 mm.  Successful PCI to the mid LAD utilizing Cutting Balloon and DES stenting with a 2.5 x 18 mm Resolute Onyx stent with the 95% stenosis being reduced to 0% evidence for brisk TIMI-3 flow with no evidence for dissection at the completion of the procedure.  RECOMMENDATION:  Recommend uninterrupted dual antiplatelet therapy with Aspirin 81mg  daily and Ticagrelor 90mg  twice daily for a minimum of 12 months (ACS - Class I recommendation).  The patient will continue with low-dose beta-blocker therapy.  Aggressive lipid-lowering therapy with high potency statin.    Past Medical History:  Diagnosis Date   Allergy    "dust mite residue, mold, mildew, grapefruit, chili peppers, cat dander, some weeds" (03/07/2018)   Anxiety  Arthritis    "minor; right knee, left hip" (03/07/2018)   Bruises easily    Cancer of left breast ([place])    double mastectomy 01/26/2011   Childhood asthma    Contact lens/glasses fitting    Coronary artery disease    a.  She underwent LHC 7/23 which revealed 95% stenosis of proximal LAD, managed with PCI/DES.    Depression    Essential hypertension    GERD (gastroesophageal reflux disease)    "at times" (03/07/2018)   Heart murmur    Hiatal hernia    High cholesterol    History of kidney stones    "lots; no OR" (03/07/2018)   Osteoporosis    Rectal bleeding    hemorroid   Ringing in ears, bilateral    "since ~  06/2017" (03/07/2018)   Sinus drainage    Past Surgical History:  Procedure Laterality Date   APPENDECTOMY     BREAST LUMPECTOMY W/ NEEDLE LOCALIZATION Left 10/05/2010   lumpectomy   BREAST SURGERY     CORONARY STENT INTERVENTION N/A 03/07/2018   Procedure: CORONARY STENT INTERVENTION;  Surgeon: Troy Sine, MD;  Location: [facility];  Service: Cardiovascular;  Laterality: N/A;   LEFT HEART CATH AND CORONARY ANGIOGRAPHY N/A 03/07/2018   Procedure: LEFT HEART CATH AND CORONARY ANGIOGRAPHY;  Surgeon: Troy Sine, MD;  Location: [facility];  Service: Cardiovascular;  Laterality: N/A;   MASTECTOMY Right 01/26/2011   MASTECTOMY COMPLETE / SIMPLE W/ SENTINEL NODE BIOPSY Left 01/26/2011   OVARIAN CYST SURGERY  1997, 2001, 2008     Current Meds  Medication Sig   albuterol (VENTOLIN HFA) 108 (90 Base) MCG/ACT inhaler Inhale 1-2 puffs into the lungs every 4 (four) hours as needed for wheezing or shortness of breath.   alendronate (FOSAMAX) 70 MG tablet    amLODipine (NORVASC) 5 MG tablet Take 1 tablet (5 mg total) by mouth daily. Please call to schedule an appointment with Dr Claiborne Billings in August 2021.   aspirin EC 81 MG tablet Take 1 tablet (81 mg total) by mouth daily.   atorvastatin (LIPITOR) 80 MG tablet Take 0.5 tablets (40 mg total) by mouth daily at 6 PM. Please call to schedule an appointment with Dr Claiborne Billings in August 2021.   cetirizine (ZYRTEC) 10 MG tablet Take 10 mg by mouth daily as needed for allergies.    clopidogrel (PLAVIX) 75 MG tablet Take 1 tablet (75 mg total) by mouth daily. Please call to schedule an appointment with Dr Claiborne Billings in August 2021.   FLUoxetine (PROZAC) 10 MG capsule Start with 30mg  for 1-2weeks, then decrease to 20mg  for 1-2 weeks, then 10mg  for 1-2 weeks and then stop. (Patient taking differently: 40 mg. Start with 30mg  for 1-2weeks, then decrease to 20mg  for 1-2 weeks, then 10mg  for 1-2 weeks and then stop.)   fluticasone (FLONASE)  50 MCG/ACT nasal spray Place 2 sprays into both nostrils as needed.   metoprolol succinate (TOPROL-XL) 25 MG 24 hr tablet Take 0.5 tablets (12.5 mg total) by mouth daily. Please call to schedule an appointment with Dr Claiborne Billings in August 2021.   nitroGLYCERIN (NITROSTAT) 0.4 MG SL tablet PLACE 1 TABLET (0.4 MG TOTAL) UNDER THE TONGUE EVERY 5 (FIVE) MINUTES AS NEEDED FOR CHEST PAIN.   pantoprazole (PROTONIX) 40 MG tablet TAKE 1 TABLET BY MOUTH EVERY DAY   Probiotic Product (ALIGN) 4 MG CAPS Take 1 capsule by mouth daily.   traZODone (DESYREL) 50 MG tablet Take 0.5 tablets (  25 mg total) by mouth at bedtime as needed. for sleep     Allergies:   Other, Elavil [amitriptyline hcl], and Sulfur   Social History   Tobacco Use   Smoking status: Never Smoker   Smokeless tobacco: Never Used  Vaping Use   Vaping Use: Never used  Substance Use Topics   Alcohol use: Yes    Comment: 03/07/2018 "probably once/month"   Drug use: No     Family Hx: The patient's family history includes Cancer in her brother and sister; Diabetes in her sister; Heart Problems in her mother; Heart failure in her paternal grandfather and paternal grandmother; Hyperlipidemia in her mother; Hypertension in her mother; Hypotension in her father; Other in her father.  ROS:   Please see the history of present illness.     All other systems reviewed and are negative.   Labs/Other Tests and Data Reviewed:    Recent Labs: 10/03/2019: ALT 24 01/25/2020: BUN 11; Creatinine, Ser 0.90; Hemoglobin 13.6; Platelets 221; Potassium 4.6; Sodium 142 04/04/2020: TSH 1.500   Recent Lipid Panel Lab Results  Component Value Date/Time   CHOL 113 10/03/2019 09:36 AM   TRIG 107 10/03/2019 09:36 AM   HDL 47 10/03/2019 09:36 AM   CHOLHDL 2.4 10/03/2019 09:36 AM   CHOLHDL 3.9 01/17/2016 09:37 AM   LDLCALC 46 10/03/2019 09:36 AM    Wt Readings from Last 3 Encounters:  04/07/20 145 lb (65.8 kg)  04/04/20 146 lb (66.2 kg)  01/25/20  144 lb (65.3 kg)     Exam:    Vital Signs:  Ht 5\' 2"  (1.575 m)    Wt 145 lb (65.8 kg)    LMP 09/10/2006    BMI 26.52 kg/m    Well nourished, well developed female in no  acute distress.   ASSESSMENT & PLAN:    1.  Coronary artery disease-no chest pain today.  Underwent cardiac catheterization with PCI 03/07/2018.  Received DES x1 to mid LAD. Continue amlodipine, aspirin, atorvastatin, clopidogrel, metoprolol, nitroglycerin, Protonix Heart healthy low-sodium diet-salty 6 given Increase physical activity as tolerated  Essential hypertension-unable to obtain recently 106/62 at office visit 04/04/2020.   Continue metoprolol, Heart healthy low-sodium diet-salty 6 given Increase physical activity as tolerated  Hyperlipidemia-10/03/2019: Cholesterol, Total 113; HDL 47; LDL Chol Calc (NIH) 46; Triglycerides 107 Continue atorvastatin Heart healthy low-sodium diet-salty 6 given Increase physical activity as tolerated  Anxiety-recently better managed.  Continues to work at [employer] home. Monitored by PCP  Disposition: Follow-up with Dr. Claiborne Billings in 6 months.  COVID-19 Education: The signs and symptoms of COVID-19 were discussed with the patient and how to seek care for testing (follow up with PCP or arrange E-visit).  The importance of social distancing was discussed today.  Patient Risk:   After full review of this patients clinical status, I feel that they are at least moderate risk at this time.  Time:   Today, I have spent 20 minutes with the patient with telehealth technology discussing medication, lab work, exercise, medication side effects.     Medication Adjustments/Labs and Tests Ordered: Current medicines are reviewed at length with the patient today.  Concerns regarding medicines are outlined above.   Tests Ordered: No orders of the defined types were placed in this encounter.  Medication Changes: No orders of the defined types were placed in this  encounter.   Disposition:  in 6 month(s)  Signed, Jossie Ng. Heber Hoog NP-C    03/20/2019 11:58 AM    [facility] [place] [address] Office [phone] Fax [phone]

## 2020-04-07 ENCOUNTER — Encounter: Payer: Self-pay | Admitting: General Practice

## 2020-04-07 ENCOUNTER — Telehealth (INDEPENDENT_AMBULATORY_CARE_PROVIDER_SITE_OTHER): Payer: Medicare HMO | Admitting: General Practice

## 2020-04-07 VITALS — Ht 62.0 in | Wt 145.0 lb

## 2020-04-07 DIAGNOSIS — I2510 Atherosclerotic heart disease of native coronary artery without angina pectoris: Secondary | ICD-10-CM

## 2020-04-07 DIAGNOSIS — I10 Essential (primary) hypertension: Secondary | ICD-10-CM | POA: Diagnosis not present

## 2020-04-07 DIAGNOSIS — E785 Hyperlipidemia, unspecified: Secondary | ICD-10-CM | POA: Diagnosis not present

## 2020-04-07 DIAGNOSIS — F419 Anxiety disorder, unspecified: Secondary | ICD-10-CM | POA: Diagnosis not present

## 2020-04-07 MED ORDER — PANTOPRAZOLE SODIUM 40 MG PO TBEC: 40.0000 mg | DELAYED_RELEASE_TABLET | Freq: Every day | ORAL | 1 refills | Status: DC

## 2020-04-07 MED ORDER — AMLODIPINE BESYLATE 5 MG PO TABS: 5.0000 mg | ORAL_TABLET | Freq: Every day | ORAL | 1 refills | Status: DC

## 2020-04-07 MED ORDER — METOPROLOL SUCCINATE ER 25 MG PO TB24: 12.5000 mg | ORAL_TABLET | Freq: Every day | ORAL | 1 refills | Status: DC

## 2020-04-07 MED ORDER — CLOPIDOGREL BISULFATE 75 MG PO TABS: 75.0000 mg | ORAL_TABLET | Freq: Every day | ORAL | 1 refills | Status: DC

## 2020-04-07 MED ORDER — ATORVASTATIN CALCIUM 80 MG PO TABS: 40.0000 mg | ORAL_TABLET | Freq: Every day | ORAL | 1 refills | Status: DC

## 2020-04-07 NOTE — Patient Instructions (Addendum)
Medication Instructions:  The current medical regimen is effective;  continue present plan and medications as directed. Please refer to the Current Medication list given to you today. *If you need a refill on your cardiac medications before your next appointment, please call your pharmacy*  Special Instructions PLEASE READ AND FOLLOW SALTY 6-ATTACHED  Follow-Up: Your next appointment:  6 month(s) 10-10-2019 @ 220pm  In Person with Shelva Majestic, MD  At [facility], you and your health needs are our priority.  As part of our continuing mission to provide you with exceptional heart care, we have created designated Provider Care Teams.  These Care Teams include your primary Cardiologist (physician) and Advanced Practice Providers (APPs -  Physician Assistants and Nurse Practitioners) who all work together to provide you with the care you need, when you need it.

## 2020-04-10 DIAGNOSIS — N760 Acute vaginitis: Secondary | ICD-10-CM | POA: Diagnosis not present

## 2020-04-10 DIAGNOSIS — R399 Unspecified symptoms and signs involving the genitourinary system: Secondary | ICD-10-CM | POA: Diagnosis not present

## 2020-04-10 DIAGNOSIS — N898 Other specified noninflammatory disorders of vagina: Secondary | ICD-10-CM | POA: Diagnosis not present

## 2020-04-10 DIAGNOSIS — R300 Dysuria: Secondary | ICD-10-CM | POA: Diagnosis not present

## 2020-04-10 DIAGNOSIS — N390 Urinary tract infection, site not specified: Secondary | ICD-10-CM | POA: Diagnosis not present

## 2020-04-22 ENCOUNTER — Other Ambulatory Visit: Payer: Self-pay | Admitting: Cardiovascular Disease

## 2020-04-24 ENCOUNTER — Other Ambulatory Visit: Payer: Self-pay | Admitting: Family Medicine

## 2020-04-24 DIAGNOSIS — J309 Allergic rhinitis, unspecified: Secondary | ICD-10-CM

## 2020-04-24 MED ORDER — FLUTICASONE PROPIONATE 50 MCG/ACT NA SUSP: 2.0000 | NASAL | 2 refills | Status: DC | PRN

## 2020-04-24 NOTE — Telephone Encounter (Signed)
Medication Refill - Medication: fluticasone (FLONASE) 50 MCG/ACT nasal spray    Has the patient contacted their pharmacy? Yes (Agent: If no, request that the patient contact the pharmacy for the refill.) (Agent: If yes, when and what did the pharmacy advise?)contact pcp  Preferred Pharmacy (with phone number or street name):  [place], [place] Phone:  [phone]  Fax:  [phone]       Agent: Please be advised that RX refills may take up to 3 business days. We ask that you follow-up with your pharmacy.

## 2020-04-28 ENCOUNTER — Other Ambulatory Visit: Payer: Self-pay | Admitting: Family Medicine

## 2020-04-28 DIAGNOSIS — J309 Allergic rhinitis, unspecified: Secondary | ICD-10-CM

## 2020-04-28 NOTE — Telephone Encounter (Signed)
Medication: fluticasone (FLONASE) 50 MCG/ACT nasal spray [[id number]]   Has the patient contacted their pharmacy? YES (Agent: If no, request that the patient contact the pharmacy for the refill.) (Agent: If yes, when and what did the pharmacy advise?)  Preferred Pharmacy (with phone number or street name): [place], [place] [address] Phone: [phone] Fax: [phone] Hours: Not open 24 hours    Agent: Please be advised that RX refills may take up to 3 business days. We ask that you follow-up with your pharmacy.

## 2020-05-01 ENCOUNTER — Telehealth: Payer: Self-pay | Admitting: Family Medicine

## 2020-05-01 NOTE — Telephone Encounter (Signed)
Please Advise on this.  Thank you,  Leamon Arnt

## 2020-05-01 NOTE — Telephone Encounter (Signed)
Pt called and stated [employer] has been calling pt regarding needing the PA for her Flonase medication. Pt stated she needs this sent to [facility]. Please advise.

## 2020-05-05 ENCOUNTER — Telehealth: Payer: Self-pay | Admitting: *Deleted

## 2020-05-05 NOTE — Telephone Encounter (Signed)
Dr. Wyline Beady company will not cover Flonase.   They will cover   Flunisolide 25 Mcg (0.025 %) Spry, PA Not Required

## 2020-05-10 ENCOUNTER — Encounter: Payer: Self-pay | Admitting: Family Medicine

## 2020-05-10 DIAGNOSIS — J309 Allergic rhinitis, unspecified: Secondary | ICD-10-CM

## 2020-05-13 NOTE — Telephone Encounter (Signed)
Will forward request to PCP to address specific instructions on how to take Flonase nasal spray.

## 2020-05-13 NOTE — Telephone Encounter (Signed)
fluticasone (FLONASE) 50 MCG/ACT nasal spray Medication Date: 04/24/2020 Department: [facility] Ordering/Authorizing: [name], MD  Order Providers  Prescribing Provider Encounter Provider  [name], MD [name], MD   fluticasone Asencion Islam) 50 MCG/ACT nasal spray 16 g 2 04/24/2020    Sig - Route: Place 2 sprays into both nostrils as needed. - Each Nare   Sent to pharmacy as: fluticasone (FLONASE) 50 MCG/ACT nasal spray   E-Prescribing Status: Receipt confirmed by pharmacy (04/24/2020 11:57 AM EDT)    [name] is calling re this script, they state they will not fill due to no instructions, I read back 2 sprays as needed, but they state it should be "a time," every four hrs, every 6 hrs They want it resent.

## 2020-06-02 ENCOUNTER — Encounter: Payer: Self-pay | Admitting: *Deleted

## 2020-06-02 ENCOUNTER — Telehealth: Payer: Self-pay | Admitting: Family Medicine

## 2020-06-02 ENCOUNTER — Other Ambulatory Visit: Payer: Self-pay | Admitting: Neurology

## 2020-06-02 NOTE — Telephone Encounter (Signed)
[name] with [facility] called back to speak with you/ Please double check to make sure it's  ok with Dr.Greene for her to taper off prescription of the FLUoxetine (PROZAC) 10 MG capsule    Please call [name] regarding this tapering dose that was requested by Dr Loni Muse  [phone]

## 2020-06-02 NOTE — Telephone Encounter (Signed)
I called Ellen [name] to discuss as Dr Jaynee Eagles had written an 4 [facility]. Ellen [name] stated she had continued taking 40 mg dose and thought Ellen providers would communicate about Ellen dosage taper. I let her know Ellen note was sent to Dr Carlota Raspberry previously but that I would reach out to his office to ensure we are on Ellen same page. [name] stated she would not fill this at Ellen local pharmacy because [facility] pay is better with mail order. I called [name]'s PCP and left message for Almyra Free to call me back.

## 2020-06-02 NOTE — Telephone Encounter (Signed)
[facility] Almyra Free) called, Pt called  for [place] change refill to [facility], but we can not change where refill is sent.   Pt would like refill for  FLUoxetine (PROZAC) 10 MG capsule sent to [facility].

## 2020-06-03 ENCOUNTER — Telehealth: Payer: Self-pay | Admitting: *Deleted

## 2020-06-03 NOTE — Telephone Encounter (Signed)
Dr. Carlota Raspberry,     [place] with [facility] wanted   double check to make sure it's  ok  for her to taper off prescription of the FLUoxetine (PROZAC) 10 MG capsule   If there is no problem let me know I will call [place].     Please call [name] regarding this tapering dose that was requested by Dr Loni Muse  [phone]

## 2020-06-03 NOTE — Telephone Encounter (Signed)
I think that is reasonable.  We did discuss tapering to a lower dose in June, so if she feels like she is doing well, certainly reasonable to taper off medication.  Prozac is long-acting so can just stop it at this point if on low dose.   Let me know if there are questions.

## 2020-06-03 NOTE — Telephone Encounter (Signed)
Left detailed message on pt vm regarding what pcp says about tapering off the prozac

## 2020-06-04 MED ORDER — FLUOXETINE HCL 10 MG PO CAPS: ORAL_CAPSULE | ORAL | 0 refills | Status: DC

## 2020-06-04 NOTE — Addendum Note (Signed)
Addended by: Gildardo Griffes on: 06/04/2020 10:21 AM   Modules accepted: Orders

## 2020-06-04 NOTE — Telephone Encounter (Signed)
Per 06/03/20 phone note from Dr Vonna Kotyk office, Dr. Carlota Raspberry is ok with patient tapering off Prozac. I sent the taper prescription to pt's mail order pharmacy as pt had said she [facility] not [facility] when it was sent in August.

## 2020-06-05 MED ORDER — FLUOXETINE HCL 10 MG PO CAPS: ORAL_CAPSULE | ORAL | 0 refills | Status: DC

## 2020-06-05 NOTE — Addendum Note (Signed)
Addended by: Gildardo Griffes on: 06/05/2020 05:11 PM   Modules accepted: Orders

## 2020-06-05 NOTE — Telephone Encounter (Signed)
[facility] faxed request to clarify dose frequency of prozac. Per Dr. Jaynee Eagles, dosing is daily. I have updated Rx and e-scribed to [facility].

## 2020-06-15 ENCOUNTER — Other Ambulatory Visit: Payer: Self-pay | Admitting: Family Medicine

## 2020-06-15 ENCOUNTER — Encounter: Payer: Self-pay | Admitting: Family Medicine

## 2020-06-18 ENCOUNTER — Other Ambulatory Visit: Payer: Self-pay

## 2020-06-18 ENCOUNTER — Encounter: Payer: Self-pay | Admitting: Family Medicine

## 2020-06-18 ENCOUNTER — Ambulatory Visit (INDEPENDENT_AMBULATORY_CARE_PROVIDER_SITE_OTHER): Payer: Medicare HMO | Admitting: Family Medicine

## 2020-06-18 DIAGNOSIS — J4521 Mild intermittent asthma with (acute) exacerbation: Secondary | ICD-10-CM

## 2020-06-18 MED ORDER — PREDNISONE 20 MG PO TABS: 40.0000 mg | ORAL_TABLET | Freq: Every day | ORAL | 0 refills | Status: DC

## 2020-06-18 MED ORDER — MONTELUKAST SODIUM 10 MG PO TABS: 10.0000 mg | ORAL_TABLET | Freq: Every day | ORAL | 3 refills | Status: DC

## 2020-06-18 NOTE — Patient Instructions (Addendum)
Albuterol up to every 6 hours as needed for wheezing.  Start prednisone today which should resolve current asthma flare.  I would consider Flovent steroid inhaler if you do have recurrent asthma flares, but I also refilled Singulair.  Return to the clinic or go to the nearest emergency room if any of your symptoms worsen or new symptoms occur.  Thank you for coming in today and good luck on Saturday.  If you have lab work done today you will be contacted with your lab results within the next 2 weeks.  If you have not heard from [place] then please contact us. The fastest way to get your results is to register for My Chart.   IF you received an x-ray today, you will receive an invoice from [facility]. Please contact [facility] at [phone] with questions or concerns regarding your invoice.   IF you received labwork today, you will receive an invoice from [facility]. Please contact [facility] at [phone] with questions or concerns regarding your invoice.   Our billing staff will not be able to assist you with questions regarding bills from these companies.  You will be contacted with the lab results as soon as they are available. The fastest way to get your results is to activate your My Chart account. Instructions are located on the last page of this paperwork. If you have not heard from [place] regarding the results in 2 weeks, please contact this office.

## 2020-06-18 NOTE — Progress Notes (Signed)
Subjective:  Patient ID: [name], female    DOB: 08-Nov-1950  Age: 69 y.o. MRN: [id number]  CC:  Chief Complaint  Patient presents with  . Follow-up    on asthma. Pt reports having some weezing and a congestion/cough at times. started about a week ago. Pt states she has a singing event coming up and really would like clear lungs for it.   . Medication Refill    Pt would like a refill on Singulair.     HPI [name] presents for   Asthma, mild intermittent  Went to clothing store approximately 1 week ago that was dusty/moldy, noticed nasal congestion, chest congestion afterwards.  No fever, no sore throat, no change in taste or smell.  She is vaccinated against Covid but has not yet received her booster.  Has had some more wheezing the past week, more the last few days, has used albuterol up to 2 times per day, last use yesterday.  Symptoms are slightly better today.  Has continued her Zyrtec and Flonase.  Prior to current flare has not used albuterol in some time.  No daily medication for asthma.  Singulair has been helpful in the past, but does not require that daily, has been helpful during acute flare in the past. History Patient Active Problem List   Diagnosis Date Noted  . Absolute anemia 06/13/2018  . Fatigue associated with anemia 06/13/2018  . Abnormal dreams 06/13/2018  . Depression 06/13/2018  . RLS (restless legs syndrome) 04/05/2018  . Atypical chest pain 03/10/2018  . CAD in native artery 03/10/2018  . Elevated blood pressure reading 03/10/2018  . Palpitations 03/10/2018  . Chest pain 03/09/2018  . Hyperlipidemia 03/08/2018  . Coronary artery disease involving native coronary artery of native heart with unstable angina pectoris ([place]) 03/07/2018  . Unstable angina ([place])   . Cough 01/02/2013  . Anxiety state 01/02/2013  . Breast cancer (DCIS), stage 0, Left, receptor +, dx 2012 09/08/2010   Past Medical History:  Diagnosis Date  . Allergy    "dust  mite residue, mold, mildew, grapefruit, chili peppers, cat dander, some weeds" (03/07/2018)  . Anxiety   . Arthritis    "minor; right knee, left hip" (03/07/2018)  . Bruises easily   . Cancer of left breast ([place])    double mastectomy 01/26/2011  . Childhood asthma   . Contact lens/glasses fitting   . Coronary artery disease    a.  She underwent LHC 7/23 which revealed 95% stenosis of proximal LAD, managed with PCI/DES.   . Depression   . Essential hypertension   . GERD (gastroesophageal reflux disease)    "at times" (03/07/2018)  . Heart murmur   . Hiatal hernia   . High cholesterol   . History of kidney stones    "lots; no OR" (03/07/2018)  . Osteoporosis   . Rectal bleeding    hemorroid  . Ringing in ears, bilateral    "since ~ 06/2017" (03/07/2018)  . Sinus drainage    Past Surgical History:  Procedure Laterality Date  . APPENDECTOMY    . BREAST LUMPECTOMY W/ NEEDLE LOCALIZATION Left 10/05/2010   lumpectomy  . BREAST SURGERY    . CORONARY STENT INTERVENTION N/A 03/07/2018   Procedure: CORONARY STENT INTERVENTION;  Surgeon: Troy Sine, MD;  Location: [facility];  Service: Cardiovascular;  Laterality: N/A;  . LEFT HEART CATH AND CORONARY ANGIOGRAPHY N/A 03/07/2018   Procedure: LEFT HEART CATH AND CORONARY ANGIOGRAPHY;  Surgeon:  Troy Sine, MD;  Location: [facility];  Service: Cardiovascular;  Laterality: N/A;  . MASTECTOMY Right 01/26/2011  . MASTECTOMY COMPLETE / SIMPLE W/ SENTINEL NODE BIOPSY Left 01/26/2011  . OVARIAN CYST SURGERY  1997, 2001, 2008   Allergies  Allergen Reactions  . Other     Grapefruit  . Elavil [Amitriptyline Hcl] Rash  . Sulfur Rash   Prior to Admission medications   Medication Sig Start Date End Date Taking? Authorizing Provider  albuterol (VENTOLIN HFA) 108 (90 Base) MCG/ACT inhaler Inhale 1-2 puffs into the lungs every 4 (four) hours as needed for wheezing or shortness of breath. 01/25/20  Yes [name], MD    alendronate (FOSAMAX) 70 MG tablet  05/18/19  Yes [provider]  amLODipine (NORVASC) 5 MG tablet Take 1 tablet (5 mg total) by mouth daily. Please call to schedule an appointment with Dr Claiborne Billings in August 2021. 04/07/20  Yes Cleaver, Jossie Ng, NP  aspirin EC 81 MG tablet Take 1 tablet (81 mg total) by mouth daily. 02/27/18  Yes Troy Sine, MD  atorvastatin (LIPITOR) 80 MG tablet Take 0.5 tablets (40 mg total) by mouth daily at 6 PM. Please call to schedule an appointment with Dr Claiborne Billings in August 2021. 04/07/20  Yes Cleaver, Jossie Ng, NP  cetirizine (ZYRTEC) 10 MG tablet Take 10 mg by mouth daily as needed for allergies.    Yes [provider]  clopidogrel (PLAVIX) 75 MG tablet Take 1 tablet (75 mg total) by mouth daily. Please call to schedule an appointment with Dr Claiborne Billings in August 2021. 04/07/20  Yes Cleaver, Jossie Ng, NP  FLUoxetine (PROZAC) 10 MG capsule Start with 30 mg by mouth daily for 1-2weeks, then decrease to 20mg  daily for 1-2 weeks, then 10mg  daily for 1-2 weeks and then stop. 06/05/20  Yes Melvenia Beam, MD  FLUoxetine (PROZAC) 40 MG capsule  04/27/20  Yes [provider]  fluticasone (FLONASE) 50 MCG/ACT nasal spray Place 2 sprays into both nostrils as needed. 04/24/20  Yes [name], MD  metoprolol succinate (TOPROL-XL) 25 MG 24 hr tablet Take 0.5 tablets (12.5 mg total) by mouth daily. Please call to schedule an appointment with Dr Claiborne Billings in August 2021. 04/07/20  Yes Cleaver, Jossie Ng, NP  montelukast (SINGULAIR) 10 MG tablet Take 10 mg by mouth at bedtime.   Yes [provider]  nitroGLYCERIN (NITROSTAT) 0.4 MG SL tablet PLACE 1 TABLET (0.4 MG TOTAL) UNDER THE TONGUE EVERY 5 (FIVE) MINUTES AS NEEDED FOR CHEST PAIN. 08/21/19  Yes Troy Sine, MD  pantoprazole (PROTONIX) 40 MG tablet TAKE 1 TABLET BY MOUTH EVERY DAY 04/22/20  Yes Troy Sine, MD  Probiotic Product (ALIGN) 4 MG CAPS Take 1 capsule by mouth daily.   Yes [provider]   traZODone (DESYREL) 50 MG tablet Take 0.5 tablets (25 mg total) by mouth at bedtime as needed. for sleep 02/13/20  Yes [name], MD   Social History   Socioeconomic History  . Marital status: Divorced    Spouse name: Not on file  . Number of children: Not on file  . Years of education: Not on file  . Highest education level: Bachelor's degree (e.g., BA, AB, BS)  Occupational History  . Not on file  Tobacco Use  . Smoking status: Never Smoker  . Smokeless tobacco: Never Used  Vaping Use  . Vaping Use: Never used  Substance and Sexual Activity  . Alcohol use:  Yes    Comment: 03/07/2018 "probably once/month"  . Drug use: No  . Sexual activity: Not on file  Other Topics Concern  . Not on file  Social History Narrative   Lives at home alone   Right handed   Caffeine: 1/2 cup of coffee daily that has (1/2 & 1/2 caf)   Social Determinants of Health   Financial Resource Strain:   . Difficulty of Paying Living Expenses: Not on file  Food Insecurity:   . Worried About Charity fundraiser in the Last Year: Not on file  . Ran Out of Food in the Last Year: Not on file  Transportation Needs:   . Lack of Transportation (Medical): Not on file  . Lack of Transportation (Non-Medical): Not on file  Physical Activity:   . Days of Exercise per Week: Not on file  . Minutes of Exercise per Session: Not on file  Stress:   . Feeling of Stress : Not on file  Social Connections:   . Frequency of Communication with Friends and Family: Not on file  . Frequency of Social Gatherings with Friends and Family: Not on file  . Attends Religious Services: Not on file  . Active Member of Clubs or Organizations: Not on file  . Attends Archivist Meetings: Not on file  . Marital Status: Not on file  Intimate Partner Violence:   . Fear of Current or Ex-Partner: Not on file  . Emotionally Abused: Not on file  . Physically Abused: Not on file  . Sexually Abused: Not on file     Review of Systems  Per HPI.  Objective:  There were no vitals filed for this visit.   Physical Exam Vitals reviewed.  Constitutional:      Appearance: She is well-developed.  HENT:     Head: Normocephalic and atraumatic.     Right Ear: Tympanic membrane and ear canal normal.     Left Ear: Tympanic membrane and ear canal normal.     Nose:     Comments: Sinuses nontender Eyes:     Conjunctiva/sclera: Conjunctivae normal.     Pupils: Pupils are equal, round, and reactive to light.  Neck:     Vascular: No carotid bruit.  Cardiovascular:     Rate and Rhythm: Normal rate and regular rhythm.     Heart sounds: Normal heart sounds.  Pulmonary:     Effort: Pulmonary effort is normal.     Breath sounds: Wheezing (with forced expiration only, some upper airway. ) present.  Abdominal:     Palpations: Abdomen is soft. There is no pulsatile mass.     Tenderness: There is no abdominal tenderness.  Skin:    General: Skin is warm and dry.  Neurological:     Mental Status: She is alert and oriented to person, place, and time.  Psychiatric:        Behavior: Behavior normal.      Assessment & Plan:  ZOEIE RITTER is a 69 y.o. female . Mild intermittent asthma with acute exacerbation - Plan: predniSONE (DELTASONE) 20 MG tablet, montelukast (SINGULAIR) 10 MG tablet  -Current flare with possible mild improvement today.  Refilled Singulair, continue albuterol if needed, start prednisone 20 mg 2/day for the next 3 days.  Potential side effects and risks were discussed, RTC precautions given.  If frequent flares would recommend Flovent inhaler daily or consider intermittent inhaled corticosteroid during flares.  RTC precautions.  No orders of the defined types were  placed in this encounter.  Patient Instructions       If you have lab work done today you will be contacted with your lab results within the next 2 weeks.  If you have not heard from [place] then please contact us. The fastest  way to get your results is to register for My Chart.   IF you received an x-ray today, you will receive an invoice from [facility]. Please contact [facility] at [phone] with questions or concerns regarding your invoice.   IF you received labwork today, you will receive an invoice from [place]. Please contact [facility] at [phone] with questions or concerns regarding your invoice.   Our billing staff will not be able to assist you with questions regarding bills from these companies.  You will be contacted with the lab results as soon as they are available. The fastest way to get your results is to activate your My Chart account. Instructions are located on the last page of this paperwork. If you have not heard from [place] regarding the results in 2 weeks, please contact this office.         Signed, [name], MD [facility]

## 2020-08-12 ENCOUNTER — Other Ambulatory Visit: Payer: Self-pay | Admitting: General Practice

## 2020-08-12 ENCOUNTER — Other Ambulatory Visit: Payer: Self-pay | Admitting: Family Medicine

## 2020-08-12 DIAGNOSIS — G4700 Insomnia, unspecified: Secondary | ICD-10-CM

## 2020-10-06 ENCOUNTER — Other Ambulatory Visit: Payer: Self-pay | Admitting: General Practice

## 2020-10-09 ENCOUNTER — Other Ambulatory Visit: Payer: Self-pay

## 2020-10-09 ENCOUNTER — Ambulatory Visit: Payer: Medicare HMO | Admitting: Cardiovascular Disease

## 2020-10-09 ENCOUNTER — Encounter: Payer: Self-pay | Admitting: Cardiovascular Disease

## 2020-10-09 DIAGNOSIS — E785 Hyperlipidemia, unspecified: Secondary | ICD-10-CM | POA: Diagnosis not present

## 2020-10-09 DIAGNOSIS — Z131 Encounter for screening for diabetes mellitus: Secondary | ICD-10-CM

## 2020-10-09 DIAGNOSIS — I2510 Atherosclerotic heart disease of native coronary artery without angina pectoris: Secondary | ICD-10-CM | POA: Diagnosis not present

## 2020-10-09 DIAGNOSIS — D649 Anemia, unspecified: Secondary | ICD-10-CM

## 2020-10-09 DIAGNOSIS — F419 Anxiety disorder, unspecified: Secondary | ICD-10-CM

## 2020-10-09 DIAGNOSIS — R251 Tremor, unspecified: Secondary | ICD-10-CM

## 2020-10-09 DIAGNOSIS — Z79899 Other long term (current) drug therapy: Secondary | ICD-10-CM

## 2020-10-09 NOTE — Progress Notes (Deleted)
Okay to DC Brilinta  and change to clopidogrel 75 mg daily

## 2020-10-09 NOTE — Progress Notes (Signed)
Cardiology Office Note    Date:  10/11/2020   ID:  Ellen, Thompson 03-18-1951, MRN [id number]  PCP:  Ellen Agreste, MD  Cardiologist:  Shelva Majestic, MD   F/U evaluation.  History of Present Illness:  [name] is a 70 y.o. female who presents for a follow-up cardiology evaluation.  I last saw her as an add-on in August 2020.  [name] was told of having a possible heart murmur as a child and also had childhood asthma.  He has a history of breast CA and in 2012 underwent double mastectomy.  She subsequently underwent gel implants.  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  Her new job has created increased work-related stress.  Recently, she has noticed a light pain sensation in her jaw.  She has noticed some mild chest tightness extending to her back which seems to be more stress mediated.  It also seems to be related to some increased anxiety.  She denies any chest pain with exercise.  She admits to recent increased belching which seems to improve some of her jaw discomfort.  She was told of having a hiatal hernia.  She does not routinely exercise but she walks the hallways at work.  He has had some issues with bilateral lower extremity swelling which was felt to be multifactorial.  She saw Dr. Servando Snare and underwent LE doppler imaging.  Is no evidence for DVT.  He did have abnormal reflux times in the common femoral vein.  Some chronic venous insufficiency in the deep venous system of the left.  There was no need for intervention but compression stockings were recommended.  Because of recurrent chest and jaw symptomatology, she presented for an initial evaluation with me on Dec 15, 2017.  I scheduled her to undergo a CT coronary angio.  The CT scan was done on 12/28/2017 and demonstrated a calcium score of 6.  There was mild calcium noted in the left main and mid LAD. The left main had less than 20% calcific stenosis, the  LAD had 50 to 75% mid LAD stenosis with mixed plaque.  There was a normal circumflex and RCA.  FFR was positive in the LAD territory at 0.5.  Chest CT also showed nonspecific 2 mm left upper lobe nodule which was felt most likely to be benign.  Follow-up was needed if the patient is low risk but if patient was high risk noncontrast chest CT could be considered in 12 months.  When I received notification of her CT scan results, we contacted the patient, initiated aspirin 81 mg, metoprolol 12.5 mg twice a day and gave her a prescription for sublingual nitroglycerin.  She had experienced exertional chest pain particularly with uphill walking.  I recommended she undergo cardiac catheterization which I performed on March 07, 2018.  She was found to have single-vessel CAD with 95% fibrotic appearing mid LAD stenosis distal to a proximal septal diagonal vessel and underwent successful stent PCI with Cutting Balloon and DES stenting with insertion of Resolute Onyx 2.5 x 18 mm stent with no suspect reduced to 0.  Subsequently, he was readmitted to the hospital several days later with recurrent chest pain, palpitations and hypertension.  Her chest pain was felt to be related to back and anxiety.  Over the last several months she has continued to be stable from a cardiac standpoint.  She denies any recurrent chest pain.  She had taken  26 weeks off for medical leave from work due to some of the anxiety and she is now back at work.  She denies chest tightness.  At times she notes some mild dizziness.  She has had issues with constipation and was recently started on a probiotic by GI.  She has been participating in phase 2 of the cardiac rehab.    I saw her in March 2020 and since her evaluation in November 2019, she  continued to do well.  She specifically denied any exertional chest pain or  shortness of breath.  She completed cardiac rehab August 14, 2018.  She was unaware of palpitations presyncope or syncope.    Laboratory showed significant benefit on atorvastatin 40 mg whether total cholesterol 114, LDL cholesterol 49, HDL 47 and triglycerides 88.  She was on low-dose Toprol-XL 12.5 mg and amlodipine 2.5 mg daily for her blood pressure and dual antiplatelet therapy with aspirin and Brilinta.    I last saw her in August 2020 at which time she admitted to increased work-related stress.  S  Over the past week, she developed left-sided jaw pain and also some discomfort in her mid upper back.  She denies any definitive exertional chest discomfort.  However, she was concerned perhaps that this was cardiac in etiology.  When specifically asked she denied any exertional chest tightness.  She did not feel that her back discomfort was due to muscular tension.  She has been utilizing her upper arms significantly at work.  She called the office today.  It was felt that she needed to be seen and obtain an ECG and presented as an add-on for my evaluation.  At that time, her EKG remained stable.  I recommended slight titration of her amlodipine back to 5 mg daily which would be helpful both for potential ischemic etiology as well as esophageal spasm and I empirically started her on Protonix.  For the last several years, she has remained stable.  She is no longer working at [employer].  She is now back to see me.  During the Covid pandemic, she admits to some weight gain.  She also has a mild essential tremor.  She has been weaning off Prozac.  At times she notes a rare chest fluttering.  She is not had recent laboratory.  She denies exertional chest pain.  She presents for reevaluation.  Past Medical History:  Diagnosis Date  . Allergy    "dust mite residue, mold, mildew, grapefruit, chili peppers, cat dander, some weeds" (03/07/2018)  . Anxiety   . Arthritis    "minor; right knee, left hip" (03/07/2018)  . Bruises easily   . Cancer of left breast ([place])    double mastectomy 01/26/2011  . Childhood asthma   . Contact  lens/glasses fitting   . Coronary artery disease    a.  She underwent LHC 7/23 which revealed 95% stenosis of proximal LAD, managed with PCI/DES.   . Depression   . Essential hypertension   . GERD (gastroesophageal reflux disease)    "at times" (03/07/2018)  . Heart murmur   . Hiatal hernia   . High cholesterol   . History of kidney stones    "lots; no OR" (03/07/2018)  . Osteoporosis   . Rectal bleeding    hemorroid  . Ringing in ears, bilateral    "since ~ 06/2017" (03/07/2018)  . Sinus drainage     Past Surgical History:  Procedure Laterality Date  . APPENDECTOMY    .  BREAST LUMPECTOMY W/ NEEDLE LOCALIZATION Left 10/05/2010   lumpectomy  . BREAST SURGERY    . CORONARY STENT INTERVENTION N/A 03/07/2018   Procedure: CORONARY STENT INTERVENTION;  Surgeon: Troy Sine, MD;  Location: [facility];  Service: Cardiovascular;  Laterality: N/A;  . LEFT HEART CATH AND CORONARY ANGIOGRAPHY N/A 03/07/2018   Procedure: LEFT HEART CATH AND CORONARY ANGIOGRAPHY;  Surgeon: Troy Sine, MD;  Location: [facility];  Service: Cardiovascular;  Laterality: N/A;  . MASTECTOMY Right 01/26/2011  . MASTECTOMY COMPLETE / SIMPLE W/ SENTINEL NODE BIOPSY Left 01/26/2011  . OVARIAN CYST SURGERY  1997, 2001, 2008    Current Medications: Outpatient Medications Prior to Visit  Medication Sig Dispense Refill  . albuterol (VENTOLIN HFA) 108 (90 Base) MCG/ACT inhaler Inhale 1-2 puffs into the lungs every 4 (four) hours as needed for wheezing or shortness of breath. 18 g 0  . alendronate (FOSAMAX) 70 MG tablet     . amLODipine (NORVASC) 5 MG tablet Take 1 tablet (5 mg total) by mouth daily. Please call to schedule an appointment with Dr Claiborne Billings in August 2021. 180 tablet 1  . aspirin EC 81 MG tablet Take 1 tablet (81 mg total) by mouth daily. 90 tablet 3  . atorvastatin (LIPITOR) 80 MG tablet Take 0.5 tablets (40 mg total) by mouth daily at 6 PM. Please call to schedule an appointment with Dr  Claiborne Billings in August 2021. 90 tablet 1  . cetirizine (ZYRTEC) 10 MG tablet Take 10 mg by mouth daily as needed for allergies.     Marland Kitchen clopidogrel (PLAVIX) 75 MG tablet TAKE 1 TABLET (75 MG TOTAL) BY MOUTH DAILY. PLEASE CALL TO SCHEDULE AN APPOINTMENT WITH DR Claiborne Billings IN AUGUST 2021. 90 tablet 2  . FLUoxetine (PROZAC) 10 MG capsule Start with 30 mg by mouth daily for 1-2weeks, then decrease to 106m daily for 1-2 weeks, then 124mdaily for 1-2 weeks and then stop. 84 capsule 0  . fluticasone (FLONASE) 50 MCG/ACT nasal spray Place 2 sprays into both nostrils as needed. 16 g 2  . metoprolol succinate (TOPROL-XL) 25 MG 24 hr tablet TAKE 1/2 TABLET EVERY DAY . PLEASE CALL TO SCHEDULE AN APPOINTMENT WITH DR KEClaiborne BillingsN AUGUST 2021. 45 tablet 5  . montelukast (SINGULAIR) 10 MG tablet Take 10 mg by mouth at bedtime.    . montelukast (SINGULAIR) 10 MG tablet Take 1 tablet (10 mg total) by mouth at bedtime. 30 tablet 3  . nitroGLYCERIN (NITROSTAT) 0.4 MG SL tablet PLACE 1 TABLET (0.4 MG TOTAL) UNDER THE TONGUE EVERY 5 (FIVE) MINUTES AS NEEDED FOR CHEST PAIN. 75 tablet 3  . pantoprazole (PROTONIX) 40 MG tablet TAKE 1 TABLET EVERY DAY 90 tablet 0  . predniSONE (DELTASONE) 20 MG tablet Take 2 tablets (40 mg total) by mouth daily with breakfast. 6 tablet 0  . Probiotic Product (ALIGN) 4 MG CAPS Take 1 capsule by mouth daily.    . traZODone (DESYREL) 50 MG tablet TAKE 1/2 TABLET AT BEDTIME AS NEEDED FOR SLEEP 30 tablet 2  . FLUoxetine (PROZAC) 40 MG capsule      No facility-administered medications prior to visit.     Allergies:   Other, Elavil [amitriptyline hcl], and Elemental sulfur   Social History   Socioeconomic History  . Marital status: Divorced    Spouse name: Not on file  . Number of children: Not on file  . Years of education: Not on file  . Highest education level: Bachelor's degree (  e.g., BA, AB, BS)  Occupational History  . Not on file  Tobacco Use  . Smoking status: Never Smoker  . Smokeless  tobacco: Never Used  Vaping Use  . Vaping Use: Never used  Substance and Sexual Activity  . Alcohol use: Yes    Comment: 03/07/2018 "probably once/month"  . Drug use: No  . Sexual activity: Not on file  Other Topics Concern  . Not on file  Social History Narrative   Lives at home alone   Right handed   Caffeine: 1/2 cup of coffee daily that has (1/2 & 1/2 caf)   Social Determinants of Health   Financial Resource Strain: Not on file  Food Insecurity: Not on file  Transportation Needs: Not on file  Physical Activity: Not on file  Stress: Not on file  Social Connections: Not on file    Social history is notable that she is divorced from her second marriage.  She was married initially at a young age and then divorced.  She was single for 20 years.  Her second marriage was of 6 years duration.  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  Family History:  The patient's family history includes Cancer in her brother and sister; Diabetes in her sister; Heart Problems in her mother; Heart failure in her paternal grandfather and paternal grandmother; Hyperlipidemia in her mother; Hypertension in her mother; Hypotension in her father; Other in her father.  Parents lived to an old age with her mother dying at 56 and her father dying at 31.  One brother died at age 46 with cancer and had diabetes.  One living brother and one living sister.  ROS General: Negative; No fevers, chills, or night sweats;  HEENT: Negative; No changes in vision or hearing, sinus congestion, difficulty swallowing Pulmonary: Negative; No cough, wheezing, shortness of breath, hemoptysis Cardiovascular: Negative; No chest pain, presyncope, syncope, palpitations GI: Positive for hiatal hernia GU: Negative; No dysuria, hematuria, or  difficulty voiding Musculoskeletal: Negative; no myalgias, joint pain, or weakness Hematologic/Oncology:   estrogen receptor positive breast cancer Endocrine: Negative; no heat/cold intolerance; no diabetes Neuro: Positive for mild tremor Skin: Negative; No rashes or skin lesions Psychiatric: Negative; No behavioral problems, depression Sleep: Negative; No snoring, daytime sleepiness, hypersomnolence, bruxism, restless legs, hypnogognic hallucinations, no cataplexy Other comprehensive 14 point system review is negative.   PHYSICAL EXAM:   VS:  BP (!) 110/56   Pulse 62   Ht 5' 2"  (1.575 m)   Wt 154 lb 3.2 oz (69.9 kg)   LMP 09/10/2006   BMI 28.20 kg/m     Repeat blood pressure by me 114/64  Wt Readings from Last 3 Encounters:  10/09/20 154 lb 3.2 oz (69.9 kg)  04/07/20 145 lb (65.8 kg)  04/04/20 146 lb (66.2 kg)     General: Alert, oriented, no distress.  Skin: normal turgor, no rashes, warm and dry HEENT: Normocephalic, atraumatic. Pupils equal round and reactive to light; sclera anicteric; extraocular muscles intact;  Nose without nasal septal hypertrophy Mouth/Parynx benign; Mallinpatti scale 3 Neck: No JVD, no carotid bruits; normal carotid upstroke Lungs: clear to ausculatation and percussion; no wheezing or rales Chest wall: without tenderness to palpitation Heart: PMI not displaced, RRR, s1 s2 normal, 1/6 systolic murmur, no diastolic murmur, no  rubs, gallops, thrills, or heaves Abdomen: soft, nontender; no hepatosplenomehaly, BS+; abdominal aorta nontender and not dilated by palpation. Back: no CVA tenderness Pulses 2+ Musculoskeletal: full range of motion, normal strength, no joint deformities Extremities: no clubbing cyanosis or edema, Homan's sign negative  Neurologic: Minimal tremor;  grossly nonfocal; Cranial nerves grossly wnl Psychologic: Normal mood and affect   Studies/Labs Reviewed:   EKG:  EKG is ordered today.  ECG (independently read by me): Sinus  bradycardia 59 bpm.  No ectopy.  No ST segment changes.  May 2020 ECG (independently read by me): Sinus bradycardia 59 bpm.  QS complex V1 V2.  Normal intervals.  No ectopy.  November 2019 ECG (independently read by me): Sinus rhythm at 60 bpm.  No ectopy.  Normal intervals  July 2019 ECG (independently read by me): Sinus bradycardia 52 bpm.  No ST segment changes  Dec 15, 2017 ECG (independently read by me): Sinus rhythm at 66 bpm.  No ectopy.  Normal intervals.  No ST segment changes.  Recent Labs: BMP Latest Ref Rng & Units 01/25/2020 10/03/2019 07/06/2018  Glucose 65 - 99 mg/dL 95 95 90  BUN 8 - 27 mg/dL 11 16 10   Creatinine 0.57 - 1.00 mg/dL 0.90 0.86 0.90  BUN/Creat Ratio 12 - 28 12 19  11(L)  Sodium 134 - 144 mmol/L 142 140 139  Potassium 3.5 - 5.2 mmol/L 4.6 4.5 4.7  Chloride 96 - 106 mmol/L 104 104 101  CO2 20 - 29 mmol/L 24 23 23   Calcium 8.7 - 10.3 mg/dL 10.3 8.7 9.7     Hepatic Function Latest Ref Rng & Units 10/03/2019 09/26/2018 07/06/2018  Total Protein 6.0 - 8.5 g/dL 6.9 6.8 6.6  Albumin 3.8 - 4.8 g/dL 4.5 4.6 4.6  AST 0 - 40 IU/L 30 26 40  ALT 0 - 32 IU/L 24 29 55(H)  Alk Phosphatase 39 - 117 IU/L 145(H) 159(H) 151(H)  Total Bilirubin 0.0 - 1.2 mg/dL 1.4(H) 1.0 1.7(H)  Bilirubin, Direct 0.00 - 0.40 mg/dL - 0.24 -    CBC Latest Ref Rng & Units 01/25/2020 10/03/2019 07/06/2018  WBC 3.4 - 10.8 x10E3/uL 5.7 6.8 5.9  Hemoglobin 11.1 - 15.9 g/dL 13.6 13.8 12.9  Hematocrit 34.0 - 46.6 % 41.7 41.4 38.4  Platelets 150 - 450 x10E3/uL 221 227 247   Lab Results  Component Value Date   MCV 96 01/25/2020   MCV 97 10/03/2019   MCV 93 07/06/2018   Lab Results  Component Value Date   TSH 1.500 04/04/2020   Lab Results  Component Value Date   HGBA1C 5.9 (H) 10/03/2019     BNP No results found for: BNP  ProBNP No results found for: PROBNP   Lipid Panel     Component Value Date/Time   CHOL 113 10/03/2019 0936   TRIG 107 10/03/2019 0936   HDL 47 10/03/2019 0936    CHOLHDL 2.4 10/03/2019 0936   CHOLHDL 3.9 01/17/2016 0937   VLDL 37 (H) 01/17/2016 0937   LDLCALC 46 10/03/2019 0936     RADIOLOGY: No results found.   Additional studies/ records that were reviewed today include:  Reviewed records from Dr. Cindee Lame.  I also reviewed her vascular surgery evaluation by Dr. Donzetta Matters. Recent laboratory from September 26, 2018 was reviewed  ------------------------------------------------------------------- Jan 02, 2018  ECHO study Conclusions  - Left ventricle: The cavity size was normal. Wall thickness was   normal. Systolic function was normal. The estimated ejection   fraction was in the  range of 55% to 60%. Wall motion was normal;   there were no regional wall motion abnormalities. Features are   consistent with a pseudonormal left ventricular filling pattern,   with concomitant abnormal relaxation and increased filling   pressure (grade 2 diastolic dysfunction). - Aortic valve: Valve mobility was restricted. There was mild   regurgitation.  Impressions:  - Normal LV systolic function; moderate diastolic dysfunction;   sclerotic aortic valve with mild AI.    CT Coronary Angio  IMPRESSION: 1.  Aortic root normal 3.3 cm  2.  Calcium score 6 which is 58 th percentile for age and sex  36.  50-75% mid LAD stenosis study will be sent for FFR CT  FFR: IMPRESSION: Hemodynamically significant mid LAD stenosis with FFR CT .50  ------------------------------------------------------------------------------------------------------- 03/07/2018: Cath/PCI Single-vessel coronary obstructive disease with a 95% fibrotic appearing mid LAD stenosis distal to a proximal septal and diagonal vessel.  Normal left circumflex and dominant RCA.  LVEDP 21 mm.  Successful PCI to the mid LAD utilizing Cutting Balloon and DES stenting with a 2.5 x 18 mm Resolute Onyx stent with the 95% stenosis being reduced to 0% evidence for brisk TIMI-3 flow  with no evidence for dissection at the completion of the procedure.  RECOMMENDATION:  Recommend uninterrupted dual antiplatelet therapy with Aspirin 42m daily and Ticagrelor 945mtwice daily for a minimum of 12 months (ACS - Class I recommendation).  The patient will continue with low-dose beta-blocker therapy.  Aggressive lipid-lowering therapy with high potency statin.    ASSESSMENT:    1. CAD in native artery; PCI LAD July 2019   2. Hyperlipidemia with target LDL less than 70   3. Medication management   4. Screening for diabetes mellitus   5. Tremor   6. Anxiety     PLAN:  Ms WaShaneka Efaws a very pleasant [age]ear old female  had experienced episodes of jaw discomfort which seemed to improve with belching and also  noticed some vague chest fullness and at times has noted this in her back in 2019.  Although this occurred nonexertional he she did admit to a sensation with uphill walking.  When I initially saw her she was noticing an increase in work-related stress which seemed to play a role in symptom development. A 2D echo Doppler study on Jan 02, 2018 revealed normal systolic function with an EF of 55 to 60% and grade 2 diastolic dysfunction.  There was mild aortic sclerosis with mild aortic insufficiency.  Although her cardiac calcium score was 6, she was found to have 95% fibrotic appearing mid LAD stenosis for which she underwent successful intervention.  Her prior chest tightness has improved.  She has had issues with anxiety and required medical leave from work for approximately 6 weeks.  She completed cardiac rehab.  Presently, she has done well without recurrent anginal type symptomatology.  When I last saw her I had titrated amlodipine back up to 5 mg daily and this has been beneficial.  She continues to be on low-dose metoprolol succinate 12.5 mg.  Her blood pressure is stable as is her ECG.  She continues to be on DAPT with aspirin/Plavix and denies bleeding.  She has been  weaning off Prozac and has been noted to develop a mild tremor which may be improving.  She has gained weight since her last evaluation with me of 14 pounds.  She has not had recent laboratory.  I have recommended comprehensive metabolic panel CBC, TSH, hemoglobin A1c and  lipid studies.  She has a prescription for albuterol in the end takes Singulair for mild asthma.  She continues to be on atorvastatin 40 mg for hyperlipidemia.  LDL cholesterol in February 2021 was 46.  I will contact her regarding her laboratory.  As long as she remains stable I will see her in 1 year for reevaluation.   Medication Adjustments/Labs and Tests Ordered: Current medicines are reviewed at length with the patient today.  Concerns regarding medicines are outlined above.  Medication changes, Labs and Tests ordered today are listed in the Patient Instructions below. Patient Instructions  Medication Instructions:  Continue current medications  *If you need a refill on your cardiac medications before your next appointment, please call your pharmacy*   Lab Work: CBC, CMP, TSH, HgB A1C, Fasting Lipids  If you have labs (blood work) drawn today and your tests are completely normal, you will receive your results only by: Marland Kitchen MyChart Message (if you have MyChart) OR . A paper copy in the mail If you have any lab test that is abnormal or we need to change your treatment, we will call you to review the results.   Testing/Procedures: None Ordered   Follow-Up: At [facility], you and your health needs are our priority.  As part of our continuing mission to provide you with exceptional heart care, we have created designated Provider Care Teams.  These Care Teams include your primary Cardiologist (physician) and Advanced Practice Providers (APPs -  Physician Assistants and Nurse Practitioners) who all work together to provide you with the care you need, when you need it.  We recommend signing up for the patient portal  called "MyChart".  Sign up information is provided on this After Visit Summary.  MyChart is used to connect with patients for Virtual Visits (Telemedicine).  Patients are able to view lab/test results, encounter notes, upcoming appointments, etc.  Non-urgent messages can be sent to your provider as well.   To learn more about what you can do with MyChart, go to [email or web address].    Your next appointment:   1 year(s)  The format for your next appointment:   In Person  Provider:   You may see Shelva Majestic, MD or one of the following Advanced Practice Providers on your designated Care Team:    Almyra Deforest, PA-C  Fabian Sharp, PA-C or   Roby Lofts, [place]        Signed, Shelva Majestic, MD  10/11/2020 1:48 PM    [facility] [address], [place], [place], [place]  [address] Phone: [phone]

## 2020-10-09 NOTE — Patient Instructions (Signed)
Medication Instructions:  Continue current medications  *If you need a refill on your cardiac medications before your next appointment, please call your pharmacy*   Lab Work: CBC, CMP, TSH, HgB A1C, Fasting Lipids  If you have labs (blood work) drawn today and your tests are completely normal, you will receive your results only by: Marland Kitchen MyChart Message (if you have MyChart) OR . A paper copy in the mail If you have any lab test that is abnormal or we need to change your treatment, we will call you to review the results.   Testing/Procedures: None Ordered   Follow-Up: At [facility], you and your health needs are our priority.  As part of our continuing mission to provide you with exceptional heart care, we have created designated Provider Care Teams.  These Care Teams include your primary Cardiologist (physician) and Advanced Practice Providers (APPs -  Physician Assistants and Nurse Practitioners) who all work together to provide you with the care you need, when you need it.  We recommend signing up for the patient portal called "MyChart".  Sign up information is provided on this After Visit Summary.  MyChart is used to connect with patients for Virtual Visits (Telemedicine).  Patients are able to view lab/test results, encounter notes, upcoming appointments, etc.  Non-urgent messages can be sent to your provider as well.   To learn more about what you can do with MyChart, go to [email or web address].    Your next appointment:   1 year(s)  The format for your next appointment:   In Person  Provider:   You may see Shelva Majestic, MD or one of the following Advanced Practice Providers on your designated Care Team:    Almyra Deforest, PA-C  Fabian Sharp, PA-C or   Roby Lofts, [place]

## 2020-10-11 ENCOUNTER — Encounter: Payer: Self-pay | Admitting: Cardiovascular Disease

## 2020-10-13 ENCOUNTER — Other Ambulatory Visit: Payer: Self-pay | Admitting: Family Medicine

## 2020-10-13 DIAGNOSIS — J309 Allergic rhinitis, unspecified: Secondary | ICD-10-CM

## 2020-10-27 ENCOUNTER — Other Ambulatory Visit: Payer: Self-pay | Admitting: Cardiovascular Disease

## 2020-11-20 DIAGNOSIS — R109 Unspecified abdominal pain: Secondary | ICD-10-CM | POA: Diagnosis not present

## 2020-11-20 DIAGNOSIS — R1031 Right lower quadrant pain: Secondary | ICD-10-CM | POA: Diagnosis not present

## 2020-11-20 DIAGNOSIS — R309 Painful micturition, unspecified: Secondary | ICD-10-CM | POA: Diagnosis not present

## 2020-12-04 ENCOUNTER — Telehealth: Payer: Self-pay | Admitting: Cardiovascular Disease

## 2020-12-04 DIAGNOSIS — E785 Hyperlipidemia, unspecified: Secondary | ICD-10-CM | POA: Diagnosis not present

## 2020-12-04 DIAGNOSIS — Z131 Encounter for screening for diabetes mellitus: Secondary | ICD-10-CM | POA: Diagnosis not present

## 2020-12-04 DIAGNOSIS — Z79899 Other long term (current) drug therapy: Secondary | ICD-10-CM | POA: Diagnosis not present

## 2020-12-04 DIAGNOSIS — D649 Anemia, unspecified: Secondary | ICD-10-CM | POA: Diagnosis not present

## 2020-12-04 DIAGNOSIS — I2510 Atherosclerotic heart disease of native coronary artery without angina pectoris: Secondary | ICD-10-CM | POA: Diagnosis not present

## 2020-12-04 NOTE — Telephone Encounter (Signed)
Patient states she is on her way now to come in for lab work. She would like to know she can also have a check for infection. She states her OBGYN suggested she have it checked.

## 2020-12-04 NOTE — Telephone Encounter (Addendum)
This RN called patient, patient answered and verified identity with name and date of birth. This RN informed pt that during her last visit Dr. Claiborne Billings ordered a CBC, which may be able to indicate the presence of infection, but that no other tests would be able to be ordered as she is coming in for strictly a lab visit. Pt indicated understanding, all question and concerns addressed at this time.

## 2020-12-05 ENCOUNTER — Telehealth: Payer: Self-pay

## 2020-12-05 LAB — LIPID PANEL
Chol/HDL Ratio: 3.1 ratio (ref 0.0–4.4)
Cholesterol, Total: 124 mg/dL (ref 100–199)
HDL: 40 mg/dL (ref >39)
LDL Chol Calc (NIH): 65 mg/dL (ref 0–99)
Triglycerides: 104 mg/dL (ref 0–149)
VLDL Cholesterol Cal: 19 mg/dL (ref 5–40)

## 2020-12-05 LAB — CBC
Hematocrit: 38.8 % (ref 34.0–46.6)
Hemoglobin: 13.1 g/dL (ref 11.1–15.9)
MCH: 31.2 pg (ref 26.6–33.0)
MCHC: 33.8 g/dL (ref 31.5–35.7)
MCV: 92 fL (ref 79–97)
Platelets: 215 10*3/uL (ref 150–450)
RBC: 4.2 x10E6/uL (ref 3.77–5.28)
RDW: 12.6 % (ref 11.7–15.4)
WBC: 5.2 10*3/uL (ref 3.4–10.8)

## 2020-12-05 LAB — COMPREHENSIVE METABOLIC PANEL
ALT: 31 IU/L (ref 0–32)
AST: 32 IU/L (ref 0–40)
Albumin/Globulin Ratio: 1.9 (ref 1.2–2.2)
Albumin: 4.4 g/dL (ref 3.8–4.8)
Alkaline Phosphatase: 155 IU/L — ABNORMAL HIGH (ref 44–121)
BUN/Creatinine Ratio: 16 (ref 12–28)
BUN: 14 mg/dL (ref 8–27)
Bilirubin Total: 1.5 mg/dL — ABNORMAL HIGH (ref 0.0–1.2)
CO2: 24 mmol/L (ref 20–29)
Calcium: 9.4 mg/dL (ref 8.7–10.3)
Chloride: 102 mmol/L (ref 96–106)
Creatinine, Ser: 0.86 mg/dL (ref 0.57–1.00)
Globulin, Total: 2.3 g/dL (ref 1.5–4.5)
Glucose: 93 mg/dL (ref 65–99)
Potassium: 4.3 mmol/L (ref 3.5–5.2)
Sodium: 141 mmol/L (ref 134–144)
Total Protein: 6.7 g/dL (ref 6.0–8.5)
eGFR: 73 mL/min/{1.73_m2} (ref >59)

## 2020-12-05 LAB — TSH: TSH: 2.13 u[IU]/mL (ref 0.450–4.500)

## 2020-12-05 LAB — HEMOGLOBIN A1C
Est. average glucose Bld gHb Est-mCnc: 120 mg/dL
Hgb A1c MFr Bld: 5.8 % — ABNORMAL HIGH (ref 4.8–5.6)

## 2020-12-05 NOTE — Telephone Encounter (Signed)
Caller : [name]  Call Back @ [phone]  Patient states she seen you at urgent care years ago and that she would like to establish care with you .   Please advise

## 2020-12-05 NOTE — Telephone Encounter (Signed)
Sure, will see her

## 2020-12-15 NOTE — Progress Notes (Signed)
[place] at [facility] [address] [place], [place] [address] [phone]  Date:  12/17/2020   Name:  [name]   DOB:  [birth year]   MRN:  [id number]  PCP:  [name], MD    Chief Complaint: Establish Care (Body aches. Hips and lower back. Hurts really bad for the last month )   History of Present Illness:  [name] is a 70 y.o. very pleasant female patient who presents with the following:  New patient here to establish care, I took care of her several years ago.  Her most recent PCP has moved, so she has returned to my office Last seen by myself in 2016 She has history of CAD status post PCI in 2019, breast cancer 2012 status post bilateral mastectomy, hyperlipidemia, kidney stones, history of asthma and allergies She was having some angina with walking up hills which led to her dx of CAD  Her gynecologist is Dr.Adkins Cardiologist Dr. Claiborne Billings, most recent visit in [place] that point he noted her to be doing well, no angina She is on aspirin and Plavix, metoprolol, amlodipine, atorvastatin  She also sees Dr.Ahern with neurology regarding her tremor She has been on fosamax for a year or so- she stopped a couple of weeks ago to see if it would help with leg pains  Oncology follow-up- she is released from care from her history of breast cancer  COVID-19 series; 2 doses so far, I encouraged third and fourth doses Pneumonia vaccine-we will give Prevnar 20 today Shingrix-not yet done, encouraged Colon 2017- she is not quite sure when she is due, last screening per [facility]  Finally, she notes lower back and bilateral hip pain which has been present for about 1 month.  It hurts mostly early in the morning or if she moves her body in a certain way.  She is not aware of any particular injury.  She does note a history of scoliosis Patient Active Problem List   Diagnosis Date Noted  . Absolute anemia 06/13/2018  .  Fatigue associated with anemia 06/13/2018  . Abnormal dreams 06/13/2018  . Depression 06/13/2018  . RLS (restless legs syndrome) 04/05/2018  . Atypical chest pain 03/10/2018  . CAD in native artery 03/10/2018  . Elevated blood pressure reading 03/10/2018  . Palpitations 03/10/2018  . Chest pain 03/09/2018  . Hyperlipidemia 03/08/2018  . Coronary artery disease involving native coronary artery of native heart with unstable angina pectoris ([place]) 03/07/2018  . Unstable angina ([place])   . Cough 01/02/2013  . Anxiety state 01/02/2013  . Breast cancer (DCIS), stage 0, Left, receptor +, dx 2012 09/08/2010    Past Medical History:  Diagnosis Date  . Allergy    "dust mite residue, mold, mildew, grapefruit, chili peppers, cat dander, some weeds" (03/07/2018)  . Anxiety   . Arthritis    "minor; right knee, left hip" (03/07/2018)  . Bruises easily   . Cancer of left breast ([place])    double mastectomy 01/26/2011  . Childhood asthma   . Contact lens/glasses fitting   . Coronary artery disease    a.  She underwent LHC 7/23 which revealed 95% stenosis of proximal LAD, managed with PCI/DES.   . Depression   . Essential hypertension   . GERD (gastroesophageal reflux disease)    "at times" (03/07/2018)  . Heart murmur   . Hiatal hernia   . High cholesterol   . History  of kidney stones    "lots; no OR" (03/07/2018)  . Osteoporosis   . Rectal bleeding    hemorroid  . Ringing in ears, bilateral    "since ~ 06/2017" (03/07/2018)  . Sinus drainage     Past Surgical History:  Procedure Laterality Date  . APPENDECTOMY    . BREAST LUMPECTOMY W/ NEEDLE LOCALIZATION Left 10/05/2010   lumpectomy  . BREAST SURGERY    . CORONARY STENT INTERVENTION N/A 03/07/2018   Procedure: CORONARY STENT INTERVENTION;  Surgeon: Troy Sine, MD;  Location: [facility];  Service: Cardiovascular;  Laterality: N/A;  . LEFT HEART CATH AND CORONARY ANGIOGRAPHY N/A 03/07/2018   Procedure: LEFT HEART CATH AND  CORONARY ANGIOGRAPHY;  Surgeon: Troy Sine, MD;  Location: [facility];  Service: Cardiovascular;  Laterality: N/A;  . MASTECTOMY Right 01/26/2011  . MASTECTOMY COMPLETE / SIMPLE W/ SENTINEL NODE BIOPSY Left 01/26/2011  . OVARIAN CYST SURGERY  1997, 2001, 2008    Social History   Tobacco Use  . Smoking status: Never Smoker  . Smokeless tobacco: Never Used  Vaping Use  . Vaping Use: Never used  Substance Use Topics  . Alcohol use: Yes    Comment: 03/07/2018 "probably once/month"  . Drug use: No    Family History  Problem Relation Age of Onset  . Hypertension Mother   . Heart Problems Mother   . Hyperlipidemia Mother   . Hypotension Father   . Other Father        pacemaker  . Heart failure Paternal Grandmother   . Heart failure Paternal Grandfather   . Cancer Brother   . Cancer Sister   . Diabetes Sister     Allergies  Allergen Reactions  . Other     Grapefruit  . Elavil [Amitriptyline Hcl] Rash  . Elemental Sulfur Rash    Medication list has been reviewed and updated.  Current Outpatient Medications on File Prior to Visit  Medication Sig Dispense Refill  . albuterol (VENTOLIN HFA) 108 (90 Base) MCG/ACT inhaler Inhale 1-2 puffs into the lungs every 4 (four) hours as needed for wheezing or shortness of breath. 18 g 0  . alendronate (FOSAMAX) 70 MG tablet     . amLODipine (NORVASC) 5 MG tablet Take 1 tablet (5 mg total) by mouth daily. Please call to schedule an appointment with Dr Claiborne Billings in August 2021. 180 tablet 1  . aspirin EC 81 MG tablet Take 1 tablet (81 mg total) by mouth daily. 90 tablet 3  . atorvastatin (LIPITOR) 80 MG tablet Take 0.5 tablets (40 mg total) by mouth daily at 6 PM. Please call to schedule an appointment with Dr Claiborne Billings in August 2021. 90 tablet 1  . cetirizine (ZYRTEC) 10 MG tablet Take 10 mg by mouth daily as needed for allergies.     Marland Kitchen clopidogrel (PLAVIX) 75 MG tablet TAKE 1 TABLET (75 MG TOTAL) BY MOUTH DAILY. PLEASE CALL TO  SCHEDULE AN APPOINTMENT WITH DR Claiborne Billings IN AUGUST 2021. 90 tablet 2  . FLUoxetine (PROZAC) 10 MG capsule Start with 30 mg by mouth daily for 1-2weeks, then decrease to 20mg  daily for 1-2 weeks, then 10mg  daily for 1-2 weeks and then stop. 84 capsule 0  . fluticasone (FLONASE) 50 MCG/ACT nasal spray USE 2 SPRAYS IN EACH NOSTRIL EVERY DAY AS NEEDED 16 g 2  . metoprolol succinate (TOPROL-XL) 25 MG 24 hr tablet TAKE 1/2 TABLET EVERY DAY . PLEASE CALL TO SCHEDULE AN APPOINTMENT WITH DR  KELLY IN AUGUST 2021. 45 tablet 5  . montelukast (SINGULAIR) 10 MG tablet Take 10 mg by mouth at bedtime.    . nitroGLYCERIN (NITROSTAT) 0.4 MG SL tablet PLACE 1 TABLET (0.4 MG TOTAL) UNDER THE TONGUE EVERY 5 (FIVE) MINUTES AS NEEDED FOR CHEST PAIN. 75 tablet 3  . pantoprazole (PROTONIX) 40 MG tablet TAKE 1 TABLET EVERY DAY 90 tablet 3  . traZODone (DESYREL) 50 MG tablet TAKE 1/2 TABLET AT BEDTIME AS NEEDED FOR SLEEP 30 tablet 2   No current facility-administered medications on file prior to visit.    Review of Systems:  As per HPI- otherwise negative.   Physical Examination: Vitals:   12/17/20 1503  BP: 124/60  Pulse: 60  Temp: 97.7 F (36.5 C)  SpO2: 99%   Vitals:   12/17/20 1503  Weight: 157 lb (71.2 kg)  Height: 5\' 1"  (1.549 m)   Body mass index is 29.66 kg/m. Ideal Body Weight: Weight in (lb) to have BMI = 25: 132  GEN: no acute distress.  Overweight, looks well HEENT: Atraumatic, Normocephalic.  Ears and Nose: No external deformity. CV: RRR, No M/G/R. No JVD. No thrill. No extra heart sounds. PULM: CTA B, no wheezes, crackles, rhonchi. No retractions. No resp. distress. No accessory muscle use. ABD: S, NT, ND, +BS. No rebound. No HSM. EXTR: No c/c/e PSYCH: Normally interactive. Conversant.  She notes tenderness in her bilateral lower back paraspinous muscles.  Normal flexion and extension of her lower back.  Normal strength and sensation, normal deep tendon reflex of both lower  extremities   Assessment and Plan: Encounter for medical examination to establish care  Chronic bilateral low back pain without sciatica - Plan: DG Lumbar Spine Complete  Bilateral hip pain - Plan: CANCELED: DG HIP UNILAT W OR W/O PELVIS 2-3 VIEWS RIGHT, CANCELED: DG HIP UNILAT W OR W/O PELVIS 2-3 VIEWS LEFT  Immunization due - Plan: Pneumococcal conjugate vaccine 20-valent (Prevnar 20)  Here today to reestablish care.  Discussed her recent health history and updated chart Update immunizations, gave Prenvar 20 This visit occurred during the SARS-CoV-2 public health emergency.  Safety protocols were in place, including screening questions prior to the visit, additional usage of staff PPE, and extensive cleaning of exam room while observing appropriate contact time as indicated for disinfecting solutions.    Signed Lamar Blinks, MD  Received her x-ray reports as below, message to pt  DG Lumbar Spine Complete  Result Date: 12/17/2020 CLINICAL DATA:  Low back pain EXAM: LUMBAR SPINE - COMPLETE 4+ VIEW COMPARISON:  02/23/2011 FINDINGS: Five non rib-bearing lumbar type vertebra. Lumbar alignment within normal limits. Vertebral body heights are maintained. The disc spaces are patent. Mild facet degenerative changes of the lower lumbar spine. IMPRESSION: Mild lower lumbar spine facet degenerative change. Electronically Signed   By: Donavan Foil M.D.   On: 12/17/2020 16:38   DG HIPS BILAT WITH PELVIS 2V  Result Date: 12/17/2020 CLINICAL DATA:  Bilateral hip pain EXAM: DG HIP (WITH OR WITHOUT PELVIS) 2V BILAT COMPARISON:  None. FINDINGS: SI joints are patent. Pubic symphysis and rami appear intact. No fracture or malalignment. The joint spaces are preserved IMPRESSION: Negative. Electronically Signed   By: Donavan Foil M.D.   On: 12/17/2020 16:39

## 2020-12-15 NOTE — Patient Instructions (Addendum)
It was very nice to reconnect with you today!  I will be in touch with your x-ray reports You got your pneumonia vaccine today- nothing further needed here I would recommend a covid 19 booster and shingles vaccine series at your pharmacy  Take care! I am glad to see you for a physical at your convenience

## 2020-12-17 ENCOUNTER — Encounter (HOSPITAL_BASED_OUTPATIENT_CLINIC_OR_DEPARTMENT_OTHER): Payer: Self-pay

## 2020-12-17 ENCOUNTER — Ambulatory Visit (HOSPITAL_BASED_OUTPATIENT_CLINIC_OR_DEPARTMENT_OTHER)
Admission: RE | Admit: 2020-12-17 | Discharge: 2020-12-17 | Disposition: A | Discharge status: Home / Self Care | Payer: Medicare HMO | Source: Ambulatory Visit | Attending: Family Medicine | Admitting: Family Medicine

## 2020-12-17 ENCOUNTER — Encounter: Payer: Self-pay | Admitting: Family Medicine

## 2020-12-17 ENCOUNTER — Other Ambulatory Visit: Payer: Self-pay | Admitting: Family Medicine

## 2020-12-17 ENCOUNTER — Other Ambulatory Visit: Payer: Self-pay

## 2020-12-17 ENCOUNTER — Ambulatory Visit (INDEPENDENT_AMBULATORY_CARE_PROVIDER_SITE_OTHER): Payer: Medicare HMO | Admitting: Family Medicine

## 2020-12-17 VITALS — BP 124/60 | HR 60 | Temp 97.7°F | Ht 61.0 in | Wt 157.0 lb

## 2020-12-17 DIAGNOSIS — M545 Low back pain, unspecified: Secondary | ICD-10-CM

## 2020-12-17 DIAGNOSIS — G8929 Other chronic pain: Secondary | ICD-10-CM | POA: Insufficient documentation

## 2020-12-17 DIAGNOSIS — M25552 Pain in left hip: Secondary | ICD-10-CM

## 2020-12-17 DIAGNOSIS — Z0000 Encounter for general adult medical examination without abnormal findings: Secondary | ICD-10-CM

## 2020-12-17 DIAGNOSIS — Z23 Encounter for immunization: Secondary | ICD-10-CM

## 2020-12-17 DIAGNOSIS — M25551 Pain in right hip: Secondary | ICD-10-CM | POA: Diagnosis not present

## 2020-12-18 MED ORDER — PREDNISONE 20 MG PO TABS: ORAL_TABLET | ORAL | 0 refills | Status: DC

## 2020-12-31 ENCOUNTER — Other Ambulatory Visit: Payer: Self-pay | Admitting: Family Medicine

## 2020-12-31 DIAGNOSIS — J309 Allergic rhinitis, unspecified: Secondary | ICD-10-CM

## 2021-01-22 NOTE — Progress Notes (Addendum)
[place] at [facility] [address] [place], [place] [address] [phone]  Date:  01/26/2021   Name:  [name]   DOB:  06-30-1951   MRN:  [id number]  PCP:  Darreld Mclean, MD    Chief Complaint: Annual Exam   History of Present Illness:  [name] is a 70 y.o. 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 2 mm left upper lobe nodule, nonspecific but statistically likely benign. No follow-up needed if patient is low-risk. Non-contrast chest CT can be considered in 12 months if patient is high-risk. This recommendation follows the consensus statement: Guidelines for Management of Incidental Pulmonary Nodules Detected on CT Images: From the Fleischner Society 2017; Radiology 2017; 284:228-243. 2.  Aortic Atherosclerosis (ICD10-I70.0).  We discussed her CT scan report, and the lung nodule.  [name] was never a significant smoker, but has consumed cigarettes in the past. She also notes a possible change in the feel of the left aspect of her sternum.  It feels more prominent than it did previously  She is seeing someone new romantically which is exciting  No history of tuberculosis, she does need QuantiFERON gold screening for health form  We did recent complete lab work.  Noted elevation of alkaline phosphatase and bilirubin.  We will continue work-up of these 2 findings today Pneumovax 20 was given last month Encouraged shingles vaccine and COVID-19 booster  She has had breast cancer and a double mastectomy, no longer does mammograms Colonoscopy completed in 2017 Patient Active Problem List   Diagnosis Date Noted   Absolute anemia 06/13/2018   Fatigue associated with anemia  06/13/2018   Abnormal dreams 06/13/2018   Depression 06/13/2018   RLS (restless legs syndrome) 04/05/2018   Atypical chest pain 03/10/2018   CAD in native artery 03/10/2018   Elevated blood pressure reading 03/10/2018   Palpitations 03/10/2018   Chest pain 03/09/2018   Hyperlipidemia 03/08/2018   Coronary artery disease involving native coronary artery of native heart with unstable angina pectoris ([place]) 03/07/2018   Unstable angina ([facility])    Cough 01/02/2013   Anxiety state 01/02/2013   Breast cancer (DCIS), stage 0, Left, receptor +, dx 2012 09/08/2010    Past Medical History:  Diagnosis Date   Allergy    "dust mite residue, mold, mildew, grapefruit, chili peppers, cat dander, some weeds" (03/07/2018)   Anxiety    Arthritis    "minor; right knee, left hip" (03/07/2018)   Bruises easily    Cancer of left breast ([place])    double mastectomy 01/26/2011   Childhood asthma    Contact lens/glasses fitting    Coronary artery disease    a.  She underwent LHC 7/23 which revealed 95% stenosis of proximal LAD, managed with PCI/DES.    Depression    Essential hypertension    GERD (gastroesophageal reflux disease)    "at times" (03/07/2018)   Heart murmur    Hiatal hernia    High cholesterol    History of kidney stones    "lots; no OR" (03/07/2018)   Osteoporosis    Rectal bleeding    hemorroid   Ringing in ears, bilateral    "since ~ 06/2017" (03/07/2018)  Sinus drainage     Past Surgical History:  Procedure Laterality Date   APPENDECTOMY     BREAST LUMPECTOMY W/ NEEDLE LOCALIZATION Left 10/05/2010   lumpectomy   BREAST SURGERY     CORONARY STENT INTERVENTION N/A 03/07/2018   Procedure: CORONARY STENT INTERVENTION;  Surgeon: Troy Sine, MD;  Location: [facility];  Service: Cardiovascular;  Laterality: N/A;   LEFT HEART CATH AND CORONARY ANGIOGRAPHY N/A 03/07/2018   Procedure: LEFT HEART CATH AND CORONARY ANGIOGRAPHY;  Surgeon: Troy Sine, MD;  Location: [facility];  Service: Cardiovascular;  Laterality: N/A;   MASTECTOMY Right 01/26/2011   MASTECTOMY COMPLETE / SIMPLE W/ SENTINEL NODE BIOPSY Left 01/26/2011   OVARIAN CYST SURGERY  1997, 2001, 2008    Social History   Tobacco Use   Smoking status: Never   Smokeless tobacco: Never  Vaping Use   Vaping Use: Never used  Substance Use Topics   Alcohol use: Yes    Comment: 03/07/2018 "probably once/month"   Drug use: No    Family History  Problem Relation Age of Onset   Hypertension Mother    Heart Problems Mother    Hyperlipidemia Mother    Hypotension Father    Other Father        pacemaker   Heart failure Paternal Grandmother    Heart failure Paternal Grandfather    Cancer Brother    Cancer Sister    Diabetes Sister     Allergies  Allergen Reactions   Other     Grapefruit   Elavil [Amitriptyline Hcl] Rash   Elemental Sulfur Rash    Medication list has been reviewed and updated.  Current Outpatient Medications on File Prior to Visit  Medication Sig Dispense Refill   albuterol (VENTOLIN HFA) 108 (90 Base) MCG/ACT inhaler Inhale 1-2 puffs into the lungs every 4 (four) hours as needed for wheezing or shortness of breath. 18 g 0   alendronate (FOSAMAX) 70 MG tablet      amLODipine (NORVASC) 5 MG tablet Take 1 tablet (5 mg total) by mouth daily. Please call to schedule an appointment with Dr Claiborne Billings in August 2021. 180 tablet 1   aspirin EC 81 MG tablet Take 1 tablet (81 mg total) by mouth daily. 90 tablet 3   atorvastatin (LIPITOR) 80 MG tablet Take 0.5 tablets (40 mg total) by mouth daily at 6 PM. Please call to schedule an appointment with Dr Claiborne Billings in August 2021. 90 tablet 1   cetirizine (ZYRTEC) 10 MG tablet Take 10 mg by mouth daily as needed for allergies.      clopidogrel (PLAVIX) 75 MG tablet TAKE 1 TABLET (75 MG TOTAL) BY MOUTH DAILY. PLEASE CALL TO SCHEDULE AN APPOINTMENT WITH DR Claiborne Billings IN AUGUST 2021. 90 tablet 2   FLUoxetine (PROZAC) 10 MG capsule Start with  30 mg by mouth daily for 1-2weeks, then decrease to 61m daily for 1-2 weeks, then 152mdaily for 1-2 weeks and then stop. 84 capsule 0   fluticasone (FLONASE) 50 MCG/ACT nasal spray USE 2 SPRAYS IN EACH NOSTRIL EVERY DAY AS NEEDED 16 g 2   metoprolol succinate (TOPROL-XL) 25 MG 24 hr tablet TAKE 1/2 TABLET EVERY DAY . PLEASE CALL TO SCHEDULE AN APPOINTMENT WITH DR KEClaiborne BillingsN AUGUST 2021. 45 tablet 5   montelukast (SINGULAIR) 10 MG tablet Take 10 mg by mouth at bedtime.     nitroGLYCERIN (NITROSTAT) 0.4 MG SL tablet PLACE 1 TABLET (0.4 MG TOTAL) UNDER THE  TONGUE EVERY 5 (FIVE) MINUTES AS NEEDED FOR CHEST PAIN. 75 tablet 3   pantoprazole (PROTONIX) 40 MG tablet TAKE 1 TABLET EVERY DAY 90 tablet 3   traZODone (DESYREL) 50 MG tablet TAKE 1/2 TABLET AT BEDTIME AS NEEDED FOR SLEEP 30 tablet 2   No current facility-administered medications on file prior to visit.    Review of Systems:  As per HPI- otherwise negative.   Physical Examination: Vitals:   01/26/21 1046  BP: 120/62  Pulse: 68  Resp: 16  Temp: (!) 97.5 F (36.4 C)  SpO2: 99%   Vitals:   01/26/21 1046  Weight: 157 lb (71.2 kg)  Height: _0  (1.549 m)   Body mass index is 29.66 kg/m. Ideal Body Weight: Weight in (lb) to have BMI = 25: 132  GEN: no acute distress.  Mild overweight, looks well HEENT: Atraumatic, Normocephalic.  Bilateral TM wnl, oropharynx normal.  PEERL,EOMI.   Ears and Nose: No external deformity. CV: RRR, No M/G/R. No JVD. No thrill. No extra heart sounds. PULM: CTA B, no wheezes, crackles, rhonchi. No retractions. No resp. distress. No accessory muscle use. ABD: S, NT, ND, +BS. No rebound. No HSM. EXTR: No c/c/e PSYCH: Normally interactive. Conversant.  I am not able to determine a definite abnormality of her sternum or anterior ribs.  However, there is a possible prominence of the ribs off the left aspect of the sternum, may be due to some element of scoliosis   Assessment and Plan: Physical  exam  Abnormal findings on diagnostic imaging of lung - Plan: CT Chest Wo Contrast  Screening examination for pulmonary tuberculosis - Plan: QuantiFERON-TB Gold Plus  Elevated alkaline phosphatase level - Plan: Alkaline phosphatase, Gamma GT, Mitochondrial Antibodies  Elevated bilirubin - Plan: Bilirubin, fractionated(tot/dir/indir) Patient today for physical exam.  She would like to follow-up the lung nodule seen in 2019, and she has noted an abnormality of her chest contour.  We will obtain a follow-up CT scan for her  Follow-up on elevated alk phos and bilirubin today as above  Tuberculosis screening ordered  Recommended Shingrix and COVID-19 booster  Signed Lamar Blinks, MD   Received her labs 6/15- message to pt  Results for orders placed or performed in visit on 01/26/21  QuantiFERON-TB Gold Plus  Result Value Ref Range   QuantiFERON-TB Gold Plus NEGATIVE NEGATIVE   NIL 0.05 IU/mL   Mitogen-NIL >10.00 IU/mL   TB1-NIL <0.00 IU/mL   TB2-NIL 0.00 IU/mL  Alkaline phosphatase  Result Value Ref Range   Alkaline Phosphatase 123 (H) 39 - 117 U/L  Gamma GT  Result Value Ref Range   GGT 12 7 - 51 U/L  Mitochondrial Antibodies  Result Value Ref Range   Mitochondrial M2 Ab, IgG < OR = 20.0 U  Bilirubin, fractionated(tot/dir/indir)  Result Value Ref Range   Total Bilirubin 1.5 (H) 0.2 - 1.2 mg/dL   Bilirubin, Direct 0.3 (H) 0.0 - 0.2 mg/dL   Indirect Bilirubin 1.2 0.2 - 1.2 mg/dL (calc)

## 2021-01-26 ENCOUNTER — Ambulatory Visit (INDEPENDENT_AMBULATORY_CARE_PROVIDER_SITE_OTHER): Payer: Medicare HMO | Admitting: Family Medicine

## 2021-01-26 ENCOUNTER — Other Ambulatory Visit: Payer: Self-pay

## 2021-01-26 ENCOUNTER — Encounter: Payer: Self-pay | Admitting: Family Medicine

## 2021-01-26 VITALS — BP 120/62 | HR 68 | Temp 97.5°F | Resp 16 | Ht 61.0 in | Wt 157.0 lb

## 2021-01-26 DIAGNOSIS — R918 Other nonspecific abnormal finding of lung field: Secondary | ICD-10-CM | POA: Diagnosis not present

## 2021-01-26 DIAGNOSIS — Z111 Encounter for screening for respiratory tuberculosis: Secondary | ICD-10-CM | POA: Diagnosis not present

## 2021-01-26 DIAGNOSIS — R17 Unspecified jaundice: Secondary | ICD-10-CM | POA: Diagnosis not present

## 2021-01-26 DIAGNOSIS — Z0000 Encounter for general adult medical examination without abnormal findings: Secondary | ICD-10-CM | POA: Diagnosis not present

## 2021-01-26 DIAGNOSIS — R748 Abnormal levels of other serum enzymes: Secondary | ICD-10-CM

## 2021-01-26 LAB — GAMMA GT: GGT: 12 U/L (ref 7–51)

## 2021-01-26 LAB — ALKALINE PHOSPHATASE: Alkaline Phosphatase: 123 U/L — ABNORMAL HIGH (ref 39–117)

## 2021-01-26 NOTE — Patient Instructions (Signed)
Good to see you again today!  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

## 2021-01-27 LAB — BILIRUBIN, FRACTIONATED(TOT/DIR/INDIR)
Bilirubin, Direct: 0.3 mg/dL — ABNORMAL HIGH (ref 0.0–0.2)
Indirect Bilirubin: 1.2 mg/dL (calc) (ref 0.2–1.2)
Total Bilirubin: 1.5 mg/dL — ABNORMAL HIGH (ref 0.2–1.2)

## 2021-01-27 LAB — MITOCHONDRIAL ANTIBODIES: Mitochondrial M2 Ab, IgG: <=20 U

## 2021-01-28 ENCOUNTER — Ambulatory Visit (HOSPITAL_BASED_OUTPATIENT_CLINIC_OR_DEPARTMENT_OTHER)
Admission: RE | Admit: 2021-01-28 | Discharge: 2021-01-28 | Disposition: A | Discharge status: Home / Self Care | Payer: Medicare HMO | Source: Ambulatory Visit | Attending: Family Medicine | Admitting: Family Medicine

## 2021-01-28 ENCOUNTER — Encounter: Payer: Self-pay | Admitting: Family Medicine

## 2021-01-28 ENCOUNTER — Other Ambulatory Visit: Payer: Self-pay

## 2021-01-28 ENCOUNTER — Ambulatory Visit (HOSPITAL_BASED_OUTPATIENT_CLINIC_OR_DEPARTMENT_OTHER): Payer: Medicare HMO

## 2021-01-28 DIAGNOSIS — R918 Other nonspecific abnormal finding of lung field: Secondary | ICD-10-CM | POA: Diagnosis not present

## 2021-01-28 DIAGNOSIS — I2510 Atherosclerotic heart disease of native coronary artery without angina pectoris: Secondary | ICD-10-CM | POA: Diagnosis not present

## 2021-01-28 DIAGNOSIS — I700 Atherosclerosis of aorta: Secondary | ICD-10-CM | POA: Diagnosis not present

## 2021-01-28 DIAGNOSIS — J984 Other disorders of lung: Secondary | ICD-10-CM | POA: Diagnosis not present

## 2021-01-28 LAB — QUANTIFERON-TB GOLD PLUS
Mitogen-NIL: >10 IU/mL
NIL: 0.05 IU/mL
QuantiFERON-TB Gold Plus: NEGATIVE
TB1-NIL: <0 IU/mL
TB2-NIL: 0 IU/mL

## 2021-01-29 ENCOUNTER — Encounter: Payer: Self-pay | Admitting: Family Medicine

## 2021-02-03 DIAGNOSIS — M9904 Segmental and somatic dysfunction of sacral region: Secondary | ICD-10-CM | POA: Diagnosis not present

## 2021-02-03 DIAGNOSIS — M9905 Segmental and somatic dysfunction of pelvic region: Secondary | ICD-10-CM | POA: Diagnosis not present

## 2021-02-03 DIAGNOSIS — M5136 Other intervertebral disc degeneration, lumbar region: Secondary | ICD-10-CM | POA: Diagnosis not present

## 2021-02-03 DIAGNOSIS — M9903 Segmental and somatic dysfunction of lumbar region: Secondary | ICD-10-CM | POA: Diagnosis not present

## 2021-02-04 DIAGNOSIS — M9905 Segmental and somatic dysfunction of pelvic region: Secondary | ICD-10-CM | POA: Diagnosis not present

## 2021-02-04 DIAGNOSIS — M5136 Other intervertebral disc degeneration, lumbar region: Secondary | ICD-10-CM | POA: Diagnosis not present

## 2021-02-04 DIAGNOSIS — M9903 Segmental and somatic dysfunction of lumbar region: Secondary | ICD-10-CM | POA: Diagnosis not present

## 2021-02-04 DIAGNOSIS — M9904 Segmental and somatic dysfunction of sacral region: Secondary | ICD-10-CM | POA: Diagnosis not present

## 2021-02-09 DIAGNOSIS — M5136 Other intervertebral disc degeneration, lumbar region: Secondary | ICD-10-CM | POA: Diagnosis not present

## 2021-02-09 DIAGNOSIS — M9905 Segmental and somatic dysfunction of pelvic region: Secondary | ICD-10-CM | POA: Diagnosis not present

## 2021-02-09 DIAGNOSIS — M9904 Segmental and somatic dysfunction of sacral region: Secondary | ICD-10-CM | POA: Diagnosis not present

## 2021-02-09 DIAGNOSIS — M9903 Segmental and somatic dysfunction of lumbar region: Secondary | ICD-10-CM | POA: Diagnosis not present

## 2021-03-02 ENCOUNTER — Encounter: Payer: Self-pay | Admitting: Family Medicine

## 2021-03-02 DIAGNOSIS — M25552 Pain in left hip: Secondary | ICD-10-CM

## 2021-03-02 DIAGNOSIS — M25551 Pain in right hip: Secondary | ICD-10-CM

## 2021-03-02 DIAGNOSIS — G8929 Other chronic pain: Secondary | ICD-10-CM

## 2021-03-02 NOTE — Addendum Note (Signed)
Addended by: Lamar Blinks C on: 03/02/2021 09:41 PM   Modules accepted: Orders

## 2021-03-10 ENCOUNTER — Encounter: Payer: Self-pay | Admitting: Family Medicine

## 2021-03-19 DIAGNOSIS — N952 Postmenopausal atrophic vaginitis: Secondary | ICD-10-CM | POA: Diagnosis not present

## 2021-05-01 ENCOUNTER — Telehealth: Payer: Self-pay | Admitting: Cardiovascular Disease

## 2021-05-01 NOTE — Telephone Encounter (Signed)
Patient c/o Palpitations:  High priority if patient c/o lightheadedness, shortness of breath, or chest pain  How long have you had palpitations/irregular HR/ Afib? Are you having the symptoms now?  Patient states for the past few weeks she has been having palpitations that mainly occur in the middle of the night. She isn't having the palpitations right now, but she states last night she woke up in her sleep 3-4 times because her heart was beating so fast.  Are you currently experiencing lightheadedness, SOB or CP?  No   Do you have a history of afib (atrial fibrillation) or irregular heart rhythm?  No   Have you checked your BP or HR? (document readings if available):  No   Are you experiencing any other symptoms?  No

## 2021-05-01 NOTE — Telephone Encounter (Signed)
Spoke with pt who report she's been experiencing episodes of feeling like her heart is racing. She report symptoms usually occur at night and last night had them 3-4 times. She doesn't know HR but just state she can feel it racing. Pt also report she just overall feels sluggish.  Appointment scheduled for 9/19 for further evaluations. Pt also made aware of ED precaution should any new symptoms develop or worsen.

## 2021-05-01 NOTE — Telephone Encounter (Signed)
Attempted to contact pt. Unable to leave message as mailbox is full.  °

## 2021-05-03 NOTE — Progress Notes (Signed)
Office Visit    Patient Name: [name] Date of Encounter: 05/04/2021  PCP:  Ellen Mclean, MD   [facility]  Cardiologist:  Ellen Majestic, MD  Advanced Practice Provider:  No care team member to display Electrophysiologist:  None      Chief Complaint    [name] is a 70 y.o. female with a hx of coronary artery disease s/p DES to LAD 02/2018, palpitations, breast cancer s/p double mastectomy 2012, LE edema secondary to venous insufficiency presents today for palpitations   Past Medical History    Past Medical History:  Diagnosis Date   Allergy    "dust mite residue, mold, mildew, grapefruit, chili peppers, cat dander, some weeds" (03/07/2018)   Anxiety    Arthritis    "minor; right knee, left hip" (03/07/2018)   Bruises easily    Cancer of left breast ([place])    double mastectomy 01/26/2011   Childhood asthma    Contact lens/glasses fitting    Coronary artery disease    a.  She underwent LHC 7/23 which revealed 95% stenosis of proximal LAD, managed with PCI/DES.    Depression    Essential hypertension    GERD (gastroesophageal reflux disease)    "at times" (03/07/2018)   Heart murmur    Hiatal hernia    High cholesterol    History of kidney stones    "lots; no OR" (03/07/2018)   Osteoporosis    Rectal bleeding    hemorroid   Ringing in ears, bilateral    "since ~ 06/2017" (03/07/2018)   Sinus drainage    Past Surgical History:  Procedure Laterality Date   APPENDECTOMY     BREAST LUMPECTOMY W/ NEEDLE LOCALIZATION Left 10/05/2010   lumpectomy   BREAST SURGERY     CORONARY STENT INTERVENTION N/A 03/07/2018   Procedure: CORONARY STENT INTERVENTION;  Surgeon: Ellen Sine, MD;  Location: [facility];  Service: Cardiovascular;  Laterality: N/A;   LEFT HEART CATH AND CORONARY ANGIOGRAPHY N/A 03/07/2018   Procedure: LEFT HEART CATH AND CORONARY ANGIOGRAPHY;  Surgeon: Ellen Sine, MD;  Location: [facility];   Service: Cardiovascular;  Laterality: N/A;   MASTECTOMY Right 01/26/2011   MASTECTOMY COMPLETE / SIMPLE W/ SENTINEL NODE BIOPSY Left 01/26/2011   OVARIAN CYST SURGERY  1997, 2001, 2008    Allergies  Allergies  Allergen Reactions   Other     Grapefruit   Elavil [Amitriptyline Hcl] Rash   Elemental Sulfur Rash    History of Present Illness    [name] is a 70 y.o. female with a hx of coronary artery disease s/p DES to LAD 02/2018, palpitations, breast cancer s/p double mastectomy 2012, LE edema secondary to venous insufficiency  last seen 10/09/20.  Previous cath 03/07/18 single vessel CAD with 95% fibrotic appearing mid LAD stenosis with PCI and DES. 10/2018 she was started on Metoprolol due to palpitations.   She is getting married and working on combining her and her fiance's household. 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 She has developed arthritis in her lower back and has upcoming orthopedic appointment this week. Concern for relstless legs which is intermittent, denies claudication symptoms. Reports intermittent palpitations at night with sensation of heart racing that have increased in frequency. No significant caffeine intake. Does not recent stress with moving. No dyspnea, chest pain.    EKGs/Labs/Other Studies Reviewed:  The following studies were reviewed today:  Jan 02, 2018  ECHO study Conclusions   - Left ventricle: The cavity size was normal. Wall thickness was   normal. Systolic function was normal. The estimated ejection   fraction was in the range of 55% to 60%. Wall motion was normal;   there were no regional wall motion abnormalities. Features are   consistent with a pseudonormal left ventricular filling pattern,   with concomitant abnormal relaxation and increased filling   pressure (grade 2 diastolic dysfunction). - Aortic valve: Valve mobility was restricted. There was mild    regurgitation.   Impressions:   - Normal LV systolic function; moderate diastolic dysfunction;   sclerotic aortic valve with mild AI.       CT Coronary Angio  IMPRESSION: 1.  Aortic root normal 3.3 cm   2.  Calcium score 6 which is 61 th percentile for age and sex   27.  50-75% mid LAD stenosis study will be sent for FFR CT   FFR: IMPRESSION: Hemodynamically significant mid LAD stenosis with FFR CT .50   ------------------------------------------------------------------------------------------------------- 03/07/2018: Cath/PCI Single-vessel coronary obstructive disease with a 95% fibrotic appearing mid LAD stenosis distal to a proximal septal and diagonal vessel.   Normal left circumflex and dominant RCA.   LVEDP 21 mm.   Successful PCI to the mid LAD utilizing Cutting Balloon and DES stenting with a 2.5 x 18 mm Resolute Onyx stent with the 95% stenosis being reduced to 0% evidence for brisk TIMI-3 flow with no evidence for dissection at the completion of the procedure.   RECOMMENDATION:   Recommend uninterrupted dual antiplatelet therapy with Aspirin '81mg'$  daily and Ticagrelor '90mg'$  twice daily for a minimum of 12 months (ACS - Class I recommendation).  The patient will continue with low-dose beta-blocker therapy.  Aggressive lipid-lowering therapy with high potency statin.   EKG:  EKG is ordered today.  The ekg ordered today demonstrates NSR 68 bpm with no acute ST/T wave changes.   Recent Labs: 12/04/2020: ALT 31; BUN 14; Creatinine, Ser 0.86; Hemoglobin 13.1; Platelets 215; Potassium 4.3; Sodium 141; TSH 2.130  Recent Lipid Panel    Component Value Date/Time   CHOL 124 12/04/2020 1127   TRIG 104 12/04/2020 1127   HDL 40 12/04/2020 1127   CHOLHDL 3.1 12/04/2020 1127   CHOLHDL 3.9 01/17/2016 0937   VLDL 37 (H) 01/17/2016 0937   LDLCALC 65 12/04/2020 1127   Home Medications   Current Meds  Medication Sig   albuterol (VENTOLIN HFA) 108 (90 Base) MCG/ACT inhaler Inhale  1-2 puffs into the lungs every 4 (four) hours as needed for wheezing or shortness of breath.   alendronate (FOSAMAX) 70 MG tablet    amLODipine (NORVASC) 5 MG tablet Take 1 tablet (5 mg total) by mouth daily. Please call to schedule an appointment with Dr Claiborne Billings in August 2021.   aspirin EC 81 MG tablet Take 1 tablet (81 mg total) by mouth daily.   atorvastatin (LIPITOR) 80 MG tablet Take 0.5 tablets (40 mg total) by mouth daily at 6 PM. Please call to schedule an appointment with Dr Claiborne Billings in August 2021.   cetirizine (ZYRTEC) 10 MG tablet Take 10 mg by mouth daily as needed for allergies.    clopidogrel (PLAVIX) 75 MG tablet TAKE 1 TABLET (75 MG TOTAL) BY MOUTH DAILY. PLEASE CALL TO SCHEDULE AN APPOINTMENT WITH DR Claiborne Billings IN AUGUST 2021.   fluticasone (FLONASE) 50 MCG/ACT nasal spray USE 2 SPRAYS IN EACH NOSTRIL EVERY  DAY AS NEEDED   metoprolol succinate (TOPROL-XL) 25 MG 24 hr tablet TAKE 1/2 TABLET EVERY DAY . PLEASE CALL TO SCHEDULE AN APPOINTMENT WITH DR Claiborne Billings IN AUGUST 2021.   montelukast (SINGULAIR) 10 MG tablet Take 10 mg by mouth at bedtime.   nitroGLYCERIN (NITROSTAT) 0.4 MG SL tablet PLACE 1 TABLET (0.4 MG TOTAL) UNDER THE TONGUE EVERY 5 (FIVE) MINUTES AS NEEDED FOR CHEST PAIN.   pantoprazole (PROTONIX) 40 MG tablet TAKE 1 TABLET EVERY DAY   traZODone (DESYREL) 50 MG tablet TAKE 1/2 TABLET AT BEDTIME AS NEEDED FOR SLEEP    Review of Systems      All other systems reviewed and are otherwise negative except as noted above.  Physical Exam    VS:  BP 120/60   Pulse 68   Ht '5\' 1"'$  (1.549 m)   Wt 158 lb 12.8 oz (72 kg)   LMP 09/10/2006   SpO2 97%   BMI 30.00 kg/m  , BMI Body mass index is 30 kg/m.  Wt Readings from Last 3 Encounters:  05/04/21 158 lb 12.8 oz (72 kg)  01/26/21 157 lb (71.2 kg)  12/17/20 157 lb (71.2 kg)     GEN: Well nourished, well developed, in no acute distress. HEENT: normal. Neck: Supple, no JVD, carotid bruits, or masses. Cardiac: RRR, no murmurs, rubs,  or gallops. No clubbing, cyanosis, edema.  Radials/PT 2+ and equal bilaterally.  Respiratory:  Respirations regular and unlabored, clear to auscultation bilaterally. GI: Soft, nontender, nondistended. MS: No deformity or atrophy. Skin: Warm and dry, no rash. Neuro:  Strength and sensation are intact. Psych: Normal affect.  Assessment & Plan    Palpitations  - Likely etiology PAC/PVC due to stress with upcoming move. No chest pain nor dyspnea. Discussed 14 day ZIO but as she has a busy schedule and possible imaging of her back this week prefers to come back to have this placed at later dates. Is not comfortable having it mailed. Labs today including TSH, CBC, BMP, magnesium to rule out alternate etiology. Encouraged to avoid caffeine, remain well hydrated. Continue Toprol 12.'5mg'$  QD - defer increased dose as previously did not tolerate.  CAD s/p DES to LAS 02/2018 - Stable with no anginal symptoms. No indication for ischemic evaluation.  GDMT includes Atorvastatin, Plavix, Aspirin, Toprol. Heart healthy diet and regular cardiovascular exercise encouraged.    HLD, LDL goal <70 - 12/04/20 LDL 65 and normal liver enzymes. Continue Atorvastatin '80mg'$  daily.   Prediabetes / Obesity - Weight loss via diet and exercise encouraged. Discussed the impact being overweight would have on cardiovascular risk.   Disposition: Follow up in 6 month(s) with Dr. Claiborne Billings or APP.  Signed, Loel Dubonnet, NP 05/04/2021, 10:11 AM [place]

## 2021-05-04 ENCOUNTER — Other Ambulatory Visit: Payer: Self-pay

## 2021-05-04 ENCOUNTER — Encounter: Payer: Self-pay | Admitting: Family Medicine

## 2021-05-04 ENCOUNTER — Encounter (HOSPITAL_BASED_OUTPATIENT_CLINIC_OR_DEPARTMENT_OTHER): Payer: Self-pay | Admitting: Family

## 2021-05-04 ENCOUNTER — Ambulatory Visit (HOSPITAL_BASED_OUTPATIENT_CLINIC_OR_DEPARTMENT_OTHER): Payer: Medicare HMO | Admitting: Family

## 2021-05-04 VITALS — BP 120/60 | HR 68 | Ht 61.0 in | Wt 158.8 lb

## 2021-05-04 DIAGNOSIS — I10 Essential (primary) hypertension: Secondary | ICD-10-CM | POA: Diagnosis not present

## 2021-05-04 DIAGNOSIS — I25118 Atherosclerotic heart disease of native coronary artery with other forms of angina pectoris: Secondary | ICD-10-CM

## 2021-05-04 DIAGNOSIS — E785 Hyperlipidemia, unspecified: Secondary | ICD-10-CM

## 2021-05-04 DIAGNOSIS — R002 Palpitations: Secondary | ICD-10-CM

## 2021-05-04 MED ORDER — AMLODIPINE BESYLATE 5 MG PO TABS: 5.0000 mg | ORAL_TABLET | Freq: Every day | ORAL | 3 refills | Status: DC

## 2021-05-04 MED ORDER — ATORVASTATIN CALCIUM 80 MG PO TABS: 40.0000 mg | ORAL_TABLET | Freq: Every day | ORAL | 3 refills | Status: DC

## 2021-05-04 MED ORDER — NITROGLYCERIN 0.4 MG SL SUBL: 0.4000 mg | SUBLINGUAL_TABLET | SUBLINGUAL | 3 refills | Status: DC | PRN

## 2021-05-04 MED ORDER — METOPROLOL SUCCINATE ER 25 MG PO TB24: ORAL_TABLET | ORAL | 3 refills | Status: DC

## 2021-05-04 MED ORDER — CLOPIDOGREL BISULFATE 75 MG PO TABS: 75.0000 mg | ORAL_TABLET | Freq: Every day | ORAL | 3 refills | Status: DC

## 2021-05-04 NOTE — Patient Instructions (Addendum)
Medication Instructions:  Continue your current medications.   *If you need a refill on your cardiac medications before your next appointment, please call your pharmacy*   Lab Work: Your physician recommends that you return for lab work today for TSH, CBC, BMP, magnesium   If you have labs (blood work) drawn today and your tests are completely normal, you will receive your results only by: [facility] (if you have MyChart) OR A paper copy in the mail If you have any lab test that is abnormal or we need to change your treatment, we will call you to review the results.   Testing/Procedures: Your EKG today showed normal sinus rhythm which is a great result.   Your physician has recommended that you wear a Zio monitor.   This monitor is a medical device that records the heart's electrical activity. Doctors most often use these monitors to diagnose arrhythmias. Arrhythmias are problems with the speed or rhythm of the heartbeat. The monitor is a small device applied to your chest. You can wear one while you do your normal daily activities. While wearing this monitor if you have any symptoms to push the button and record what you felt. Once you have worn this monitor for the period of time provider prescribed (Usually 14 days), you will return the monitor device in the postage paid box. Once it is returned they will download the data collected and provide [place] with a report which the provider will then review and we will call you with those results. Important tips:  Avoid showering during the first 24 hours of wearing the monitor. Avoid excessive sweating to help maximize wear time. Do not submerge the device, no hot tubs, and no swimming pools. Keep any lotions or oils away from the patch. After 24 hours you may shower with the patch on. Take brief showers with your back facing the shower head.  Do not remove patch once it has been placed because that will interrupt data and decrease adhesive  wear time. Push the button when you have any symptoms and write down what you were feeling. Once you have completed wearing your monitor, remove and place into box which has postage paid and place in your outgoing mailbox.  If for some reason you have misplaced your box then call our office and we can provide another box and/or mail it off for you.     Follow-Up: At [facility], you and your health needs are our priority.  As part of our continuing mission to provide you with exceptional heart care, we have created designated Provider Care Teams.  These Care Teams include your primary Cardiologist (physician) and Advanced Practice Providers (APPs -  Physician Assistants and Nurse Practitioners) who all work together to provide you with the care you need, when you need it.  We recommend signing up for the patient portal called "MyChart".  Sign up information is provided on this After Visit Summary.  MyChart is used to connect with patients for Virtual Visits (Telemedicine).  Patients are able to view lab/test results, encounter notes, upcoming appointments, etc.  Non-urgent messages can be sent to your provider as well.   To learn more about what you can do with MyChart, go to [email or web address].    Your next appointment:   February or March 2023 with Dr. Claiborne Billings  The format for your next appointment:   In Person  Provider:   You may see Shelva Majestic, MD or one of the following Advanced Practice Providers  on your designated Care Team:   Almyra Deforest, PA-C Fabian Sharp, [place] or  Roby Lofts, [place]   Other Instructions  To prevent palpitations: Make sure you are adequately hydrated.  Avoid and/or limit caffeine containing beverages like soda or tea. Exercise regularly.  Manage stress well. Some over the counter medications can cause palpitations such as Benadryl, AdvilPM, TylenolPM. Regular Advil or Tylenol do not cause palpitations.    Heart Healthy Diet Recommendations: A  low-salt diet is recommended. Meats should be grilled, baked, or boiled. Avoid fried foods. Focus on lean protein sources like fish or chicken with vegetables and fruits. The American Heart Association is a Microbiologist!   Exercise recommendations: The American Heart Association recommends 150 minutes of moderate intensity exercise weekly. Try 30 minutes of moderate intensity exercise 4-5 times per week. This could include walking, jogging, or swimming.

## 2021-05-05 LAB — MAGNESIUM: Magnesium: 2 mg/dL (ref 1.6–2.3)

## 2021-05-05 LAB — CBC
Hematocrit: 42.2 % (ref 34.0–46.6)
Hemoglobin: 13.9 g/dL (ref 11.1–15.9)
MCH: 31.2 pg (ref 26.6–33.0)
MCHC: 32.9 g/dL (ref 31.5–35.7)
MCV: 95 fL (ref 79–97)
Platelets: 219 10*3/uL (ref 150–450)
RBC: 4.46 x10E6/uL (ref 3.77–5.28)
RDW: 12.5 % (ref 11.7–15.4)
WBC: 5.8 10*3/uL (ref 3.4–10.8)

## 2021-05-05 LAB — BASIC METABOLIC PANEL
BUN/Creatinine Ratio: 16 (ref 12–28)
BUN: 14 mg/dL (ref 8–27)
CO2: 24 mmol/L (ref 20–29)
Calcium: 10.1 mg/dL (ref 8.7–10.3)
Chloride: 105 mmol/L (ref 96–106)
Creatinine, Ser: 0.88 mg/dL (ref 0.57–1.00)
Glucose: 73 mg/dL (ref 65–99)
Potassium: 4.3 mmol/L (ref 3.5–5.2)
Sodium: 144 mmol/L (ref 134–144)
eGFR: 71 mL/min/{1.73_m2} (ref >59)

## 2021-05-05 LAB — TSH: TSH: 1.53 u[IU]/mL (ref 0.450–4.500)

## 2021-05-06 DIAGNOSIS — M25552 Pain in left hip: Secondary | ICD-10-CM | POA: Diagnosis not present

## 2021-05-06 DIAGNOSIS — M5136 Other intervertebral disc degeneration, lumbar region: Secondary | ICD-10-CM | POA: Insufficient documentation

## 2021-05-26 DIAGNOSIS — H04123 Dry eye syndrome of bilateral lacrimal glands: Secondary | ICD-10-CM | POA: Diagnosis not present

## 2021-05-26 DIAGNOSIS — H43393 Other vitreous opacities, bilateral: Secondary | ICD-10-CM | POA: Diagnosis not present

## 2021-05-26 DIAGNOSIS — H2513 Age-related nuclear cataract, bilateral: Secondary | ICD-10-CM | POA: Diagnosis not present

## 2021-05-26 DIAGNOSIS — H0102A Squamous blepharitis right eye, upper and lower eyelids: Secondary | ICD-10-CM | POA: Diagnosis not present

## 2021-05-26 DIAGNOSIS — H5000 Unspecified esotropia: Secondary | ICD-10-CM | POA: Diagnosis not present

## 2021-05-26 DIAGNOSIS — H0102B Squamous blepharitis left eye, upper and lower eyelids: Secondary | ICD-10-CM | POA: Diagnosis not present

## 2021-05-26 DIAGNOSIS — H1045 Other chronic allergic conjunctivitis: Secondary | ICD-10-CM | POA: Diagnosis not present

## 2021-06-10 DIAGNOSIS — M47816 Spondylosis without myelopathy or radiculopathy, lumbar region: Secondary | ICD-10-CM | POA: Diagnosis not present

## 2021-06-10 DIAGNOSIS — M5136 Other intervertebral disc degeneration, lumbar region: Secondary | ICD-10-CM | POA: Diagnosis not present

## 2021-06-10 DIAGNOSIS — M25552 Pain in left hip: Secondary | ICD-10-CM | POA: Diagnosis not present

## 2021-06-16 DIAGNOSIS — Z6828 Body mass index (BMI) 28.0-28.9, adult: Secondary | ICD-10-CM | POA: Diagnosis not present

## 2021-06-16 DIAGNOSIS — Z01419 Encounter for gynecological examination (general) (routine) without abnormal findings: Secondary | ICD-10-CM | POA: Diagnosis not present

## 2021-06-16 DIAGNOSIS — N644 Mastodynia: Secondary | ICD-10-CM | POA: Diagnosis not present

## 2021-06-17 ENCOUNTER — Other Ambulatory Visit: Payer: Self-pay | Admitting: Obstetrics and Gynecology

## 2021-06-17 DIAGNOSIS — N644 Mastodynia: Secondary | ICD-10-CM

## 2021-06-17 DIAGNOSIS — M47816 Spondylosis without myelopathy or radiculopathy, lumbar region: Secondary | ICD-10-CM | POA: Insufficient documentation

## 2021-06-29 ENCOUNTER — Telehealth: Payer: Self-pay | Admitting: Family Medicine

## 2021-06-29 DIAGNOSIS — M25552 Pain in left hip: Secondary | ICD-10-CM | POA: Diagnosis not present

## 2021-06-29 NOTE — Telephone Encounter (Signed)
Left message for patient to call back and schedule Medicare Annual Wellness Visit (AWV) in office.  ° °If not able to come in office, please offer to do virtually or by telephone.  Left office number and my jabber #[phone]. ° °Due for AWVI ° °Please schedule at anytime with Nurse Health Advisor. °  °

## 2021-06-30 IMAGING — CT CT CHEST W/O CM
2 of 4 series · 15 of 36 positions shown, 18 images · non-contrast
Comparison: CT February 27, 2018

CLINICAL DATA: Follow-up for pulmonary nodule seen on prior CT.

EXAM:
CT CHEST WITHOUT CONTRAST
TECHNIQUE: Multidetector CT imaging of the chest was performed following the
standard protocol without IV contrast.

[Series 2: thorax · axial · 0.73mm/px · z∈[+903,+1167]mm · 12 of 158 slices shown, 15 images]
[im 13/158  mediastinal]
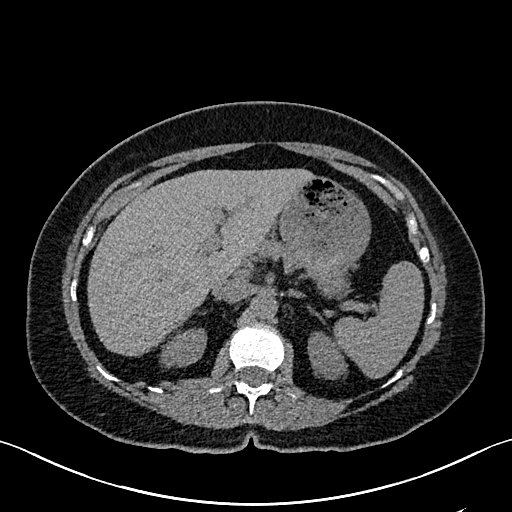
[im 13/158  lung]
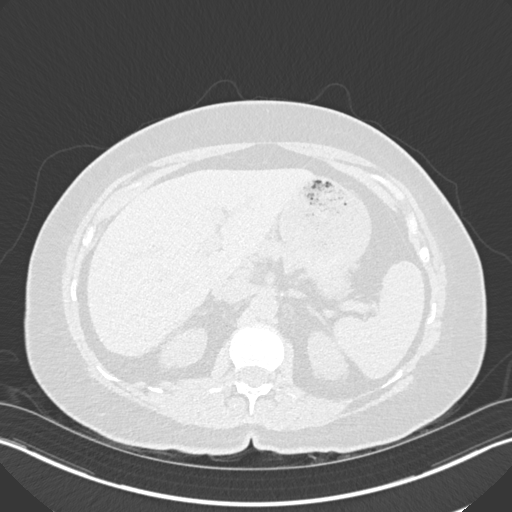
[im 25/158  lung]
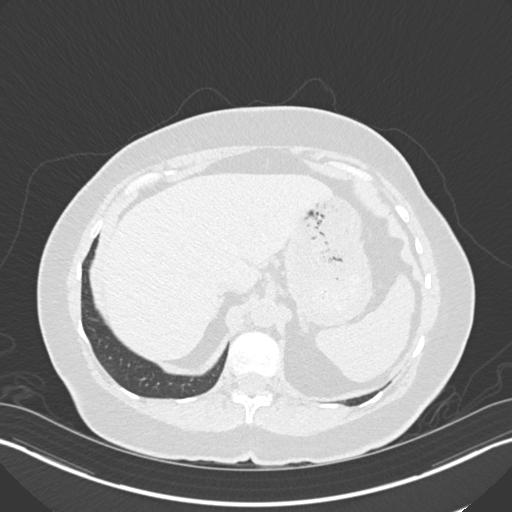
[im 37/158  lung]
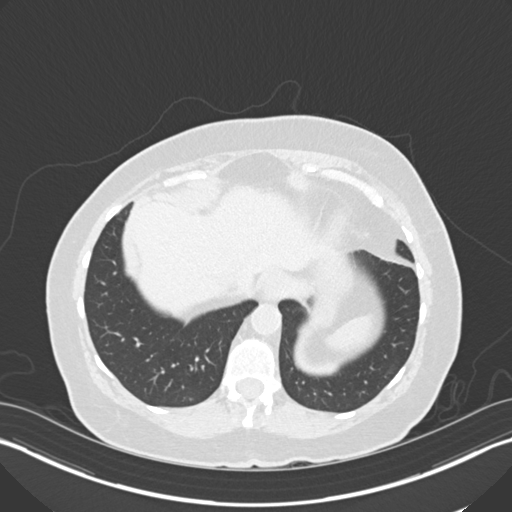
[im 49/158  lung]
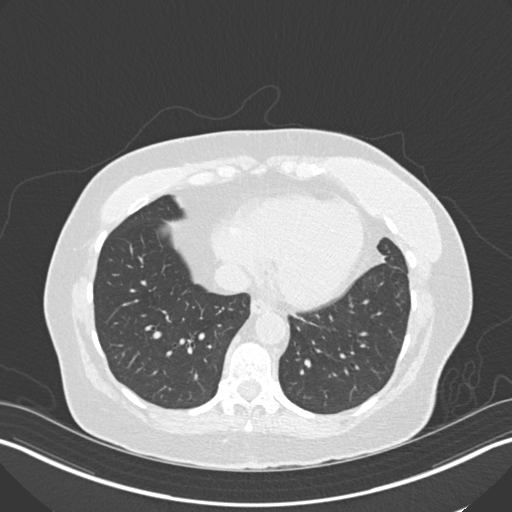
[im 61/158  mediastinal]
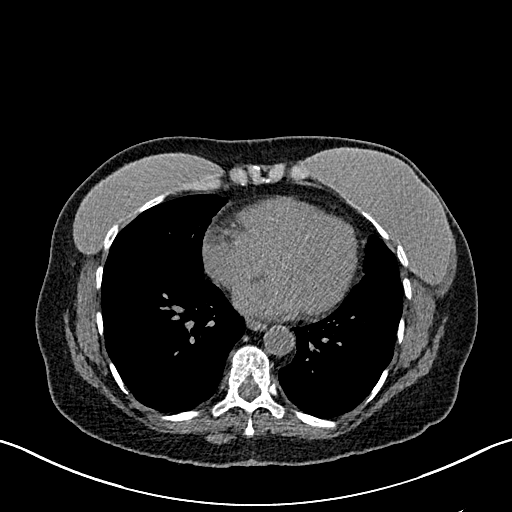
[im 61/158  lung]
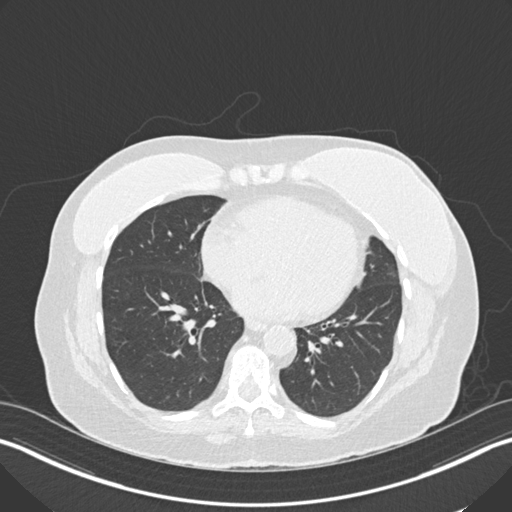
[im 73/158  lung]
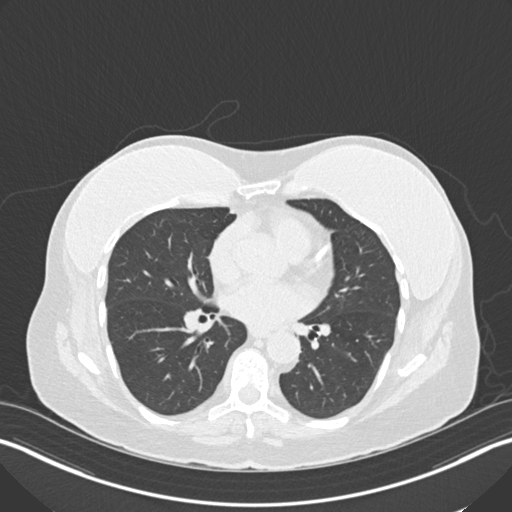
[im 85/158  lung]
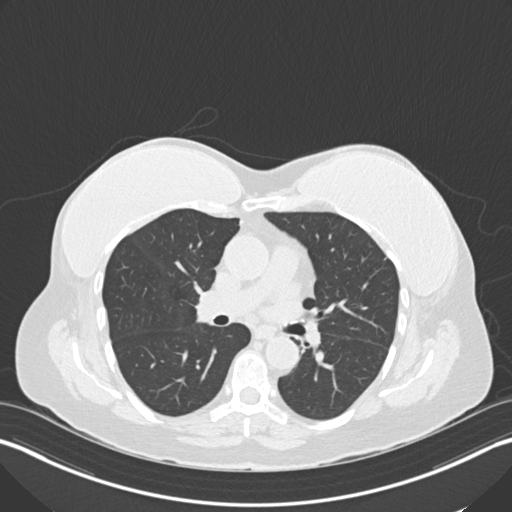
[im 97/158  lung]
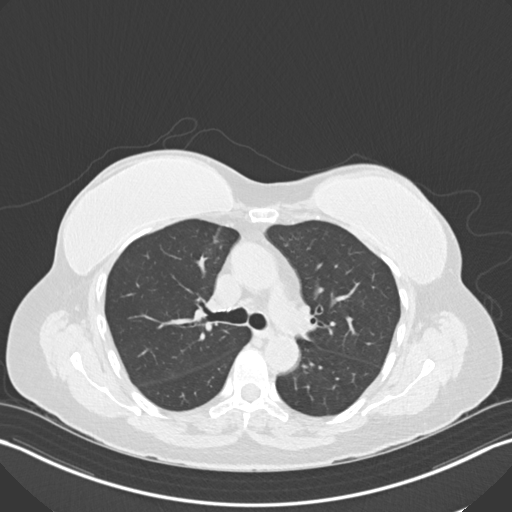
[im 109/158  mediastinal]
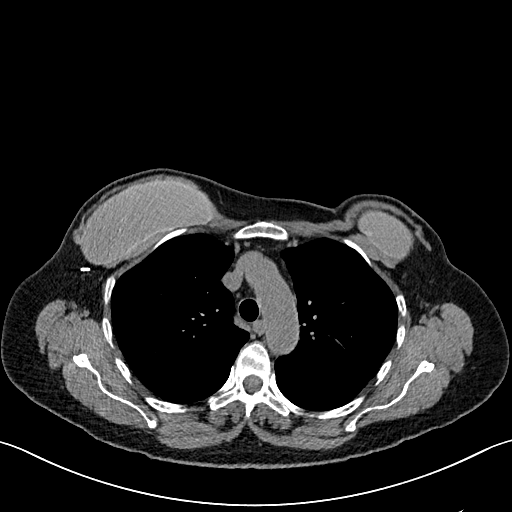
[im 109/158  lung]
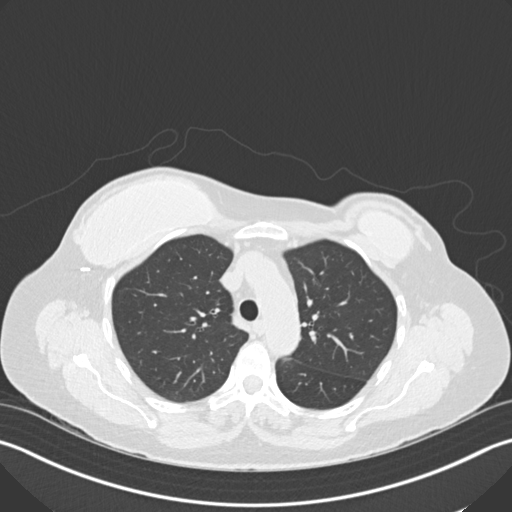
[im 121/158  lung]
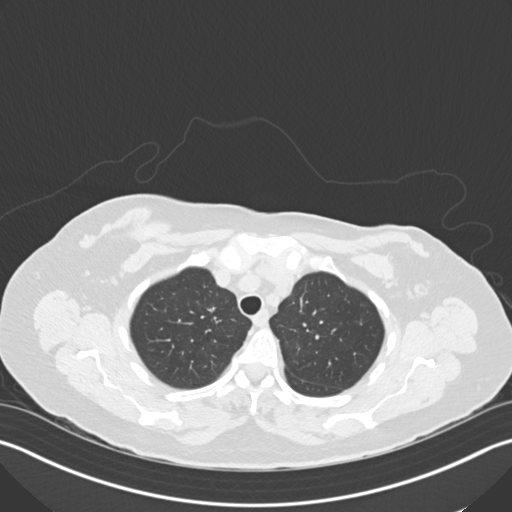
[im 133/158  lung]
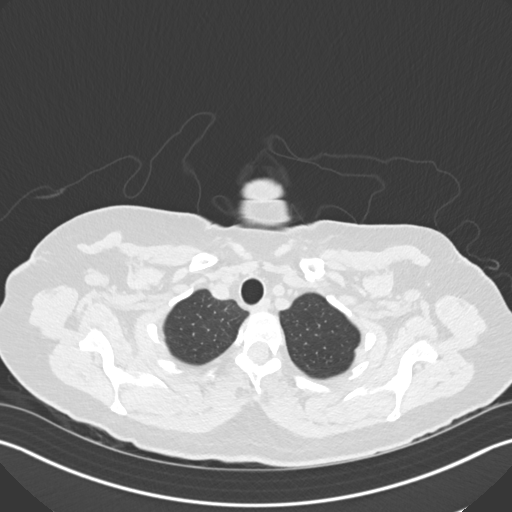
[im 145/158  lung]
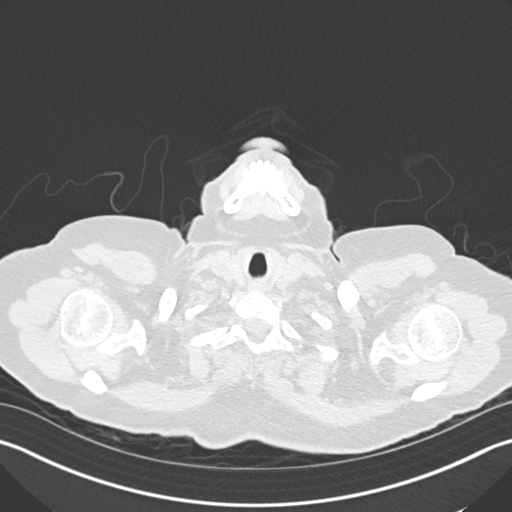

[Series 5: coronal · coronal · 0.58mm/px · 3 of 151 slices shown]
[im 31/151  lung]
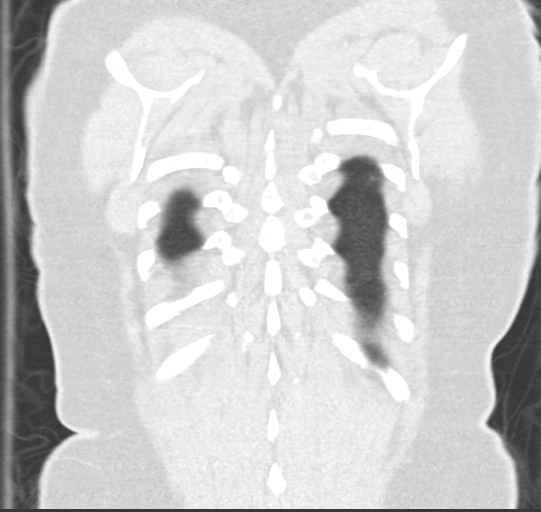
[im 61/151  lung]
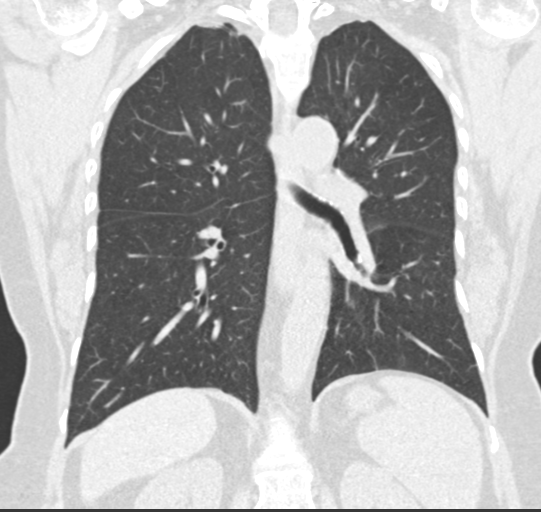
[im 91/151  lung]
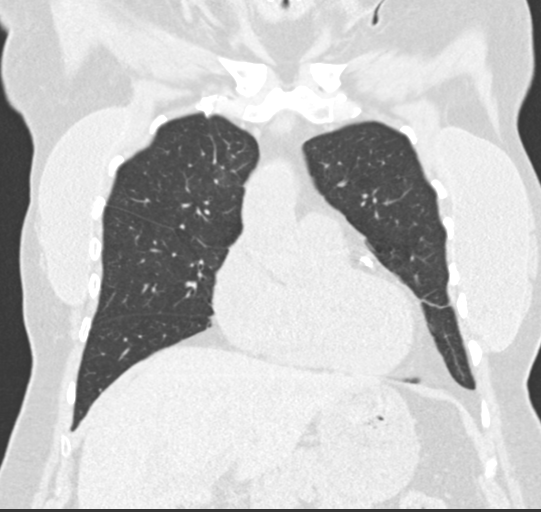

[15 of 36 positions shown; findings below may reference images not displayed]

FINDINGS: Cardiovascular: Scattered aortic atherosclerosis without aneurysmal
dilation. Scattered coronary artery calcifications. Stent in the
LAD. Normal size heart. No significant pericardial
effusion/thickening.

Mediastinum/Nodes: No supraclavicular adenopathy. No discrete
thyroid nodularity. Biopsy clip in the right axilla. No
pathologically enlarged axillary, mediastinal or hilar lymph nodes
within the limitation of noncontrast examination. The trachea and
esophagus are grossly unremarkable.

Lungs/Pleura: Stable 2 mm pulmonary nodule in the anterior left
upper lobe on image 75/3, favored benign. Biapical
pleural-parenchymal scarring. Stable 3 mm subpleural pulmonary
nodule on image 118/3, favored benign. Stable 3 mm ground-glass
pulmonary nodule in the right middle lobe on image 83/3, favored
benign. New 2 mm solid pulmonary nodule in the posterior aspect of
the right lower lobe on image 123/3. No focal airspace
consolidations. No pleural effusion. No pneumothorax.

Upper Abdomen: Unremarkable non contrasted appearance of the
visualized portions of the hepatic parenchyma. Partially visualized
hypodense 2.5 cm right upper pole renal lesion on image 152/2,
favored represent cysts. Punctate nonobstructive right upper pole
renal calculi.

Musculoskeletal: Bilateral breast prostheses. No aggressive lytic or
blastic lesions of bone.
IMPRESSION: 1. Multiple tiny 3 mm or smaller pulmonary nodules in the bilateral
lungs, the majority of which are stable in comparison to prior with
a single new 2 mm pulmonary nodule in the posterior aspect of the
right lower lobe. These are favored benign and likely infectious or
inflammatory in nature. No follow-up needed if patient is low-risk
(and has no known or suspected primary neoplasm). Non-contrast chest
CT can be considered in 12 months if patient is high-risk. This
recommendation follows the consensus statement: Guidelines for
Management of Incidental Pulmonary Nodules Detected on CT Images:
2. Partially visualized hypodense right upper pole renal lesion,
favored to represent a renal cyst.
3. Aortic atherosclerosis.

## 2021-07-02 ENCOUNTER — Ambulatory Visit
Admission: RE | Admit: 2021-07-02 | Discharge: 2021-07-02 | Disposition: A | Discharge status: Home / Self Care | Payer: Medicare HMO | Source: Ambulatory Visit | Attending: Obstetrics and Gynecology | Admitting: Obstetrics and Gynecology

## 2021-07-02 ENCOUNTER — Other Ambulatory Visit: Payer: Self-pay

## 2021-07-02 DIAGNOSIS — N644 Mastodynia: Secondary | ICD-10-CM

## 2021-08-04 ENCOUNTER — Other Ambulatory Visit: Payer: Self-pay

## 2021-08-04 ENCOUNTER — Telehealth: Payer: Self-pay | Admitting: Family Medicine

## 2021-08-04 DIAGNOSIS — J309 Allergic rhinitis, unspecified: Secondary | ICD-10-CM

## 2021-08-04 MED ORDER — FLUTICASONE PROPIONATE 50 MCG/ACT NA SUSP: NASAL | 2 refills | Status: DC

## 2021-08-04 NOTE — Telephone Encounter (Signed)
Left message for patient to call back and schedule Medicare Annual Wellness Visit (AWV) in office.  ° °If not able to come in office, please offer to do virtually or by telephone.  Left office number and my jabber #[phone]. ° °Due for AWVI ° °Please schedule at anytime with Nurse Health Advisor. °  °

## 2021-10-13 DIAGNOSIS — M9904 Segmental and somatic dysfunction of sacral region: Secondary | ICD-10-CM | POA: Diagnosis not present

## 2021-10-13 DIAGNOSIS — M9903 Segmental and somatic dysfunction of lumbar region: Secondary | ICD-10-CM | POA: Diagnosis not present

## 2021-10-13 DIAGNOSIS — M9905 Segmental and somatic dysfunction of pelvic region: Secondary | ICD-10-CM | POA: Diagnosis not present

## 2021-10-13 DIAGNOSIS — M5136 Other intervertebral disc degeneration, lumbar region: Secondary | ICD-10-CM | POA: Diagnosis not present

## 2021-10-15 DIAGNOSIS — M5136 Other intervertebral disc degeneration, lumbar region: Secondary | ICD-10-CM | POA: Diagnosis not present

## 2021-10-15 DIAGNOSIS — M9903 Segmental and somatic dysfunction of lumbar region: Secondary | ICD-10-CM | POA: Diagnosis not present

## 2021-10-15 DIAGNOSIS — M9904 Segmental and somatic dysfunction of sacral region: Secondary | ICD-10-CM | POA: Diagnosis not present

## 2021-10-15 DIAGNOSIS — M9905 Segmental and somatic dysfunction of pelvic region: Secondary | ICD-10-CM | POA: Diagnosis not present

## 2021-10-16 ENCOUNTER — Telehealth: Payer: Self-pay | Admitting: Family Medicine

## 2021-10-16 NOTE — Telephone Encounter (Signed)
Left message for patient to call back and schedule Medicare Annual Wellness Visit (AWV) in office.  ° °If not able to come in office, please offer to do virtually or by telephone.  Left office number and my jabber #[phone]. ° °Due for AWVI ° °Please schedule at anytime with Nurse Health Advisor. °  °

## 2021-10-19 DIAGNOSIS — M9903 Segmental and somatic dysfunction of lumbar region: Secondary | ICD-10-CM | POA: Diagnosis not present

## 2021-10-19 DIAGNOSIS — M9905 Segmental and somatic dysfunction of pelvic region: Secondary | ICD-10-CM | POA: Diagnosis not present

## 2021-10-19 DIAGNOSIS — M9904 Segmental and somatic dysfunction of sacral region: Secondary | ICD-10-CM | POA: Diagnosis not present

## 2021-10-19 DIAGNOSIS — M5136 Other intervertebral disc degeneration, lumbar region: Secondary | ICD-10-CM | POA: Diagnosis not present

## 2021-10-21 DIAGNOSIS — M9904 Segmental and somatic dysfunction of sacral region: Secondary | ICD-10-CM | POA: Diagnosis not present

## 2021-10-21 DIAGNOSIS — M9903 Segmental and somatic dysfunction of lumbar region: Secondary | ICD-10-CM | POA: Diagnosis not present

## 2021-10-21 DIAGNOSIS — M5136 Other intervertebral disc degeneration, lumbar region: Secondary | ICD-10-CM | POA: Diagnosis not present

## 2021-10-21 DIAGNOSIS — M9905 Segmental and somatic dysfunction of pelvic region: Secondary | ICD-10-CM | POA: Diagnosis not present

## 2021-10-26 DIAGNOSIS — M5136 Other intervertebral disc degeneration, lumbar region: Secondary | ICD-10-CM | POA: Diagnosis not present

## 2021-10-26 DIAGNOSIS — M9904 Segmental and somatic dysfunction of sacral region: Secondary | ICD-10-CM | POA: Diagnosis not present

## 2021-10-26 DIAGNOSIS — M9905 Segmental and somatic dysfunction of pelvic region: Secondary | ICD-10-CM | POA: Diagnosis not present

## 2021-10-26 DIAGNOSIS — M9903 Segmental and somatic dysfunction of lumbar region: Secondary | ICD-10-CM | POA: Diagnosis not present

## 2021-10-29 ENCOUNTER — Other Ambulatory Visit: Payer: Self-pay

## 2021-10-29 ENCOUNTER — Ambulatory Visit: Payer: Medicare HMO | Admitting: Cardiovascular Disease

## 2021-10-29 ENCOUNTER — Encounter: Payer: Self-pay | Admitting: Cardiovascular Disease

## 2021-10-29 DIAGNOSIS — R7309 Other abnormal glucose: Secondary | ICD-10-CM | POA: Diagnosis not present

## 2021-10-29 DIAGNOSIS — I2510 Atherosclerotic heart disease of native coronary artery without angina pectoris: Secondary | ICD-10-CM | POA: Diagnosis not present

## 2021-10-29 DIAGNOSIS — E785 Hyperlipidemia, unspecified: Secondary | ICD-10-CM | POA: Diagnosis not present

## 2021-10-29 DIAGNOSIS — Z79899 Other long term (current) drug therapy: Secondary | ICD-10-CM | POA: Diagnosis not present

## 2021-10-29 DIAGNOSIS — I10 Essential (primary) hypertension: Secondary | ICD-10-CM | POA: Diagnosis not present

## 2021-10-29 DIAGNOSIS — Z9861 Coronary angioplasty status: Secondary | ICD-10-CM | POA: Diagnosis not present

## 2021-10-29 NOTE — Progress Notes (Signed)
? ?Cardiology Office Note   ? ?Date:  10/29/2021  ? ?ID:  [name], DOB 05-24-1951, MRN [id number] ? ?PCP:  Darreld Mclean, MD  ?Cardiologist:  Shelva Majestic, MD  ? ?F/U evaluation. ? ?History of Present Illness:  ?[name] is a 71 y.o. female who presents for a follow-up cardiology evaluation.  I last saw her as an add-on in August 2020. ? ?[name] was told of having a possible heart murmur as a child and also had childhood asthma.  He has a history of breast CA and in 2012 underwent double mastectomy.  She subsequently underwent gel implants.  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  Her new job has created increased work-related stress.  Recently, she has noticed a light pain sensation in her jaw.  She has noticed some mild chest tightness extending to her back which seems to be more stress mediated.  It also seems to be related to some increased anxiety.  She denies any chest pain with exercise.  She admits to recent increased belching which seems to improve some of her jaw discomfort.  She was told of having a hiatal hernia.  She does not routinely exercise but she walks the hallways at work.  He has had some issues with bilateral lower extremity swelling which was felt to be multifactorial.  She saw Dr. Servando Snare and underwent LE doppler imaging.  Is no evidence for DVT.  He did have abnormal reflux times in the common femoral vein.  Some chronic venous insufficiency in the deep venous system of the left.  There was no need for intervention but compression stockings were recommended.  Because of recurrent chest and jaw symptomatology, she presented for an initial evaluation with me on Dec 15, 2017. ? ?I scheduled her to undergo a CT coronary angio.  The CT scan was done on 12/28/2017 and demonstrated a calcium score of 6.  There was mild calcium noted in the left main and mid LAD. The left main had less than 20% calcific stenosis, the  LAD had 50 to 75% mid LAD stenosis with mixed plaque.  There was a normal circumflex and RCA.  FFR was positive in the LAD territory at 0.5.  Chest CT also showed nonspecific 2 mm left upper lobe nodule which was felt most likely to be benign.  Follow-up was needed if the patient is low risk but if patient was high risk noncontrast chest CT could be considered in 12 months.  When I received notification of her CT scan results, we contacted the patient, initiated aspirin 81 mg, metoprolol 12.5 mg twice a day and gave her a prescription for sublingual nitroglycerin.  She had experienced exertional chest pain particularly with uphill walking. ? ?I recommended she undergo cardiac catheterization which I performed on March 07, 2018.  She was found to have single-vessel CAD with 95% fibrotic appearing mid LAD stenosis distal to a proximal septal diagonal vessel and underwent successful stent PCI with Cutting Balloon and DES stenting with insertion of Resolute Onyx 2.5 x 18 mm stent with no suspect reduced to 0.  Subsequently, he was readmitted to the hospital several days later with recurrent chest pain, palpitations and hypertension.  Her chest pain was felt to be related to back and anxiety.  Over the last several months she has continued to be stable from a cardiac standpoint.  She denies any recurrent chest pain.  She had taken  26 weeks off for medical leave from work due to some of the anxiety and she is now back at work.  She denies chest tightness.  At times she notes some mild dizziness.  She has had issues with constipation and was recently started on a probiotic by GI.  She has been participating in phase 2 of the cardiac rehab.   ? ?I saw her in March 2020 and since her evaluation in November 2019, she  continued to do well.  She specifically denied any exertional chest pain or  shortness of breath.  She completed cardiac rehab August 14, 2018.  She was unaware of palpitations presyncope or syncope.    Laboratory showed significant benefit on atorvastatin 40 mg whether total cholesterol 114, LDL cholesterol 49, HDL 47 and triglycerides 88.  She was on low-dose Toprol-XL 12.5 mg and amlodipine 2.5 mg daily for her blood pressure and dual antiplatelet therapy with aspirin and Brilinta.   ? ?I lsaw her in August 2020 at which time she admitted to increased work-related stress.  S  Over the past week, she developed left-sided jaw pain and also some discomfort in her mid upper back.  She denies any definitive exertional chest discomfort.  However, she was concerned perhaps that this was cardiac in etiology.  When specifically asked she denied any exertional chest tightness.  She did not feel that her back discomfort was due to muscular tension.  She has been utilizing her upper arms significantly at work.  She called the office today.  It was felt that she needed to be seen and obtain an ECG and presented as an add-on for my evaluation.  At that time, her EKG remained stable.  I recommended slight titration of her amlodipine back to 5 mg daily which would be helpful both for potential ischemic etiology as well as esophageal spasm and I empirically started her on Protonix. ? ?I last saw her in October 09, 2020.  She was no longer working at [employer] home and was back singing. During the Covid pandemic, she admits to some weight gain.  She also has a mild essential tremor.  She has been weaning off Prozac.  At times she notes a rare chest fluttering.  She had not had recent laboratory.  She denied exertional chest pain.   ? ?Since I last saw her, her last laboratory was in April 2022 which showed a total cholesterol 124, LDL 65, triglycerides 104 and HDL 40.  She had normal renal function. ? ?Presently, she feels well.  She will be getting married on May 20.  This will be her third marriage.  Her previous husband after their divorce has subsequently died.  She is back doing singing engagements and will be seen in [place] in the next week.  She feels well.  She denies any anginal type symptoms.  She admits to very rare palpitations.  She has not had recent lab work.  She presents for evaluation. ? ? ?Past Medical History:  ?Diagnosis Date  ? Allergy   ? "dust mite residue, mold, mildew, grapefruit, chili peppers, cat dander, some weeds" (03/07/2018)  ? Anxiety   ? Arthritis   ? "minor; right knee, left hip" (03/07/2018)  ? Bruises easily   ? Cancer of left breast ([place])   ? double mastectomy 01/26/2011  ? Childhood asthma   ? Contact lens/glasses fitting   ? Coronary artery disease   ? a.  She underwent LHC 7/23 which revealed 95% stenosis  of proximal LAD, managed with PCI/DES.   ? Depression   ? Essential hypertension   ? GERD (gastroesophageal reflux disease)   ? "at times" (03/07/2018)  ? Heart murmur   ? Hiatal hernia   ? High cholesterol   ? History of kidney stones   ? "lots; no OR" (03/07/2018)  ? Osteoporosis   ? Rectal bleeding   ? hemorroid  ? Ringing in ears, bilateral   ? "since ~ 06/2017" (03/07/2018)  ? Sinus drainage   ? ? ?Past Surgical History:  ?Procedure Laterality Date  ? APPENDECTOMY    ? BREAST LUMPECTOMY W/ NEEDLE LOCALIZATION Left 10/05/2010  ? lumpectomy  ? BREAST SURGERY    ? CORONARY STENT INTERVENTION N/A 03/07/2018  ? Procedure: CORONARY STENT INTERVENTION;  Surgeon: Troy Sine, MD;  Location: [facility];  Service: Cardiovascular;  Laterality: N/A;  ? LEFT HEART CATH AND CORONARY ANGIOGRAPHY N/A 03/07/2018  ? Procedure: LEFT HEART CATH AND CORONARY ANGIOGRAPHY;  Surgeon: Troy Sine, MD;  Location: [facility];  Service: Cardiovascular;  Laterality: N/A;  ? MASTECTOMY Right 01/26/2011  ? MASTECTOMY COMPLETE / SIMPLE W/ SENTINEL NODE BIOPSY Left 01/26/2011  ? OVARIAN CYST SURGERY  1997, 2001, 2008  ? ? ?Current Medications: ?Outpatient Medications Prior to Visit  ?Medication Sig Dispense Refill  ? albuterol (VENTOLIN HFA) 108 (90 Base) MCG/ACT inhaler Inhale 1-2 puffs into the lungs  every 4 (four) hours as needed for wheezing or shortness of breath. 18 g 0  ? alendronate (FOSAMAX) 70 MG tablet     ? amLODipine (NORVASC) 5 MG tablet Take 1 tablet (5 mg total) by mouth daily. 180 tablet 3

## 2021-10-29 NOTE — Patient Instructions (Signed)
Medication Instructions:  ?Your physician recommends that you continue on your current medications as directed. Please refer to the Current Medication list given to you today.  ? ?*If you need a refill on your cardiac medications before your next appointment, please call your pharmacy* ? ?Lab Work: ?FASTING LP/CMET/CBC/TSH/A1C/LPa soon  ? ?If you have labs (blood work) drawn today and your tests are completely normal, you will receive your results only by: ?MyChart Message (if you have MyChart) OR ?A paper copy in the mail ?If you have any lab test that is abnormal or we need to change your treatment, we will call you to review the results. ? ?Testing/Procedures: ?NONE  ? ?Follow-Up: ?At [facility], you and your health needs are our priority.  As part of our continuing mission to provide you with exceptional heart care, we have created designated Provider Care Teams.  These Care Teams include your primary Cardiologist (physician) and Advanced Practice Providers (APPs -  Physician Assistants and Nurse Practitioners) who all work together to provide you with the care you need, when you need it. ? ?We recommend signing up for the patient portal called "MyChart".  Sign up information is provided on this After Visit Summary.  MyChart is used to connect with patients for Virtual Visits (Telemedicine).  Patients are able to view lab/test results, encounter notes, upcoming appointments, etc.  Non-urgent messages can be sent to your provider as well.   ?To learn more about what you can do with MyChart, go to [email or web address].   ? ?Your next appointment:   ?12 month(s) ? ?The format for your next appointment:   ?In Person ? ?Provider:   ?Shelva Majestic, MD { ? ?

## 2021-11-24 DIAGNOSIS — E785 Hyperlipidemia, unspecified: Secondary | ICD-10-CM | POA: Diagnosis not present

## 2021-11-24 DIAGNOSIS — R7309 Other abnormal glucose: Secondary | ICD-10-CM | POA: Diagnosis not present

## 2021-11-24 DIAGNOSIS — I10 Essential (primary) hypertension: Secondary | ICD-10-CM | POA: Diagnosis not present

## 2021-11-24 DIAGNOSIS — Z79899 Other long term (current) drug therapy: Secondary | ICD-10-CM | POA: Diagnosis not present

## 2021-11-25 LAB — CBC WITH DIFFERENTIAL/PLATELET
Basophils Absolute: 0 10*3/uL (ref 0.0–0.2)
Basos: 1 %
EOS (ABSOLUTE): 0.1 10*3/uL (ref 0.0–0.4)
Eos: 1 %
Hematocrit: 40 % (ref 34.0–46.6)
Hemoglobin: 13.6 g/dL (ref 11.1–15.9)
Immature Grans (Abs): 0 10*3/uL (ref 0.0–0.1)
Immature Granulocytes: 0 %
Lymphocytes Absolute: 1.7 10*3/uL (ref 0.7–3.1)
Lymphs: 24 %
MCH: 31.8 pg (ref 26.6–33.0)
MCHC: 34 g/dL (ref 31.5–35.7)
MCV: 94 fL (ref 79–97)
Monocytes Absolute: 0.3 10*3/uL (ref 0.1–0.9)
Monocytes: 4 %
Neutrophils Absolute: 4.8 10*3/uL (ref 1.4–7.0)
Neutrophils: 70 %
Platelets: 220 10*3/uL (ref 150–450)
RBC: 4.28 x10E6/uL (ref 3.77–5.28)
RDW: 13.1 % (ref 11.7–15.4)
WBC: 6.9 10*3/uL (ref 3.4–10.8)

## 2021-11-25 LAB — COMPREHENSIVE METABOLIC PANEL
ALT: 25 IU/L (ref 0–32)
AST: 23 IU/L (ref 0–40)
Albumin/Globulin Ratio: 2.6 — ABNORMAL HIGH (ref 1.2–2.2)
Albumin: 4.7 g/dL (ref 3.8–4.8)
Alkaline Phosphatase: 136 IU/L — ABNORMAL HIGH (ref 44–121)
BUN/Creatinine Ratio: 16 (ref 12–28)
BUN: 14 mg/dL (ref 8–27)
Bilirubin Total: 1.6 mg/dL — ABNORMAL HIGH (ref 0.0–1.2)
CO2: 26 mmol/L (ref 20–29)
Calcium: 10 mg/dL (ref 8.7–10.3)
Chloride: 102 mmol/L (ref 96–106)
Creatinine, Ser: 0.89 mg/dL (ref 0.57–1.00)
Globulin, Total: 1.8 g/dL (ref 1.5–4.5)
Glucose: 99 mg/dL (ref 70–99)
Potassium: 4.4 mmol/L (ref 3.5–5.2)
Sodium: 141 mmol/L (ref 134–144)
Total Protein: 6.5 g/dL (ref 6.0–8.5)
eGFR: 70 mL/min/{1.73_m2} (ref >59)

## 2021-11-25 LAB — HEMOGLOBIN A1C
Est. average glucose Bld gHb Est-mCnc: 120 mg/dL
Hgb A1c MFr Bld: 5.8 % — ABNORMAL HIGH (ref 4.8–5.6)

## 2021-11-25 LAB — LIPID PANEL
Chol/HDL Ratio: 3.4 ratio (ref 0.0–4.4)
Cholesterol, Total: 130 mg/dL (ref 100–199)
HDL: 38 mg/dL — ABNORMAL LOW (ref >39)
LDL Chol Calc (NIH): 71 mg/dL (ref 0–99)
Triglycerides: 116 mg/dL (ref 0–149)
VLDL Cholesterol Cal: 21 mg/dL (ref 5–40)

## 2021-11-25 LAB — LIPOPROTEIN A (LPA): Lipoprotein (a): 18.6 nmol/L (ref <75.0)

## 2021-11-25 LAB — TSH: TSH: 1.81 u[IU]/mL (ref 0.450–4.500)

## 2021-11-26 ENCOUNTER — Encounter: Payer: Self-pay | Admitting: Cardiovascular Disease

## 2021-11-26 ENCOUNTER — Other Ambulatory Visit: Payer: Self-pay

## 2021-11-26 ENCOUNTER — Telehealth: Payer: Self-pay | Admitting: Family Medicine

## 2021-11-26 DIAGNOSIS — I10 Essential (primary) hypertension: Secondary | ICD-10-CM

## 2021-11-26 DIAGNOSIS — I25118 Atherosclerotic heart disease of native coronary artery with other forms of angina pectoris: Secondary | ICD-10-CM

## 2021-11-26 DIAGNOSIS — R002 Palpitations: Secondary | ICD-10-CM

## 2021-11-26 DIAGNOSIS — J4520 Mild intermittent asthma, uncomplicated: Secondary | ICD-10-CM

## 2021-11-26 DIAGNOSIS — E785 Hyperlipidemia, unspecified: Secondary | ICD-10-CM

## 2021-11-26 DIAGNOSIS — G4700 Insomnia, unspecified: Secondary | ICD-10-CM

## 2021-11-26 MED ORDER — TRAZODONE HCL 50 MG PO TABS: ORAL_TABLET | ORAL | 0 refills | Status: DC

## 2021-11-26 NOTE — Telephone Encounter (Signed)
Refilled, message sent to schedule next ov.  ?

## 2021-11-26 NOTE — Telephone Encounter (Signed)
Medication: traZODone (DESYREL) 50 MG tablet ? ?Has the patient contacted their pharmacy? Yes.   ? ?Preferred Pharmacy (with phone number or street name):  ?[id number] [address] [place] ?Phone:  [phone] Fax:  [phone] ? ?Agent: Please be advised that RX refills may take up to 3 business days. We ask that you follow-up with your pharmacy.  ?

## 2021-11-27 ENCOUNTER — Encounter: Payer: Self-pay | Admitting: Cardiovascular Disease

## 2021-11-27 DIAGNOSIS — I25118 Atherosclerotic heart disease of native coronary artery with other forms of angina pectoris: Secondary | ICD-10-CM

## 2021-11-27 DIAGNOSIS — R002 Palpitations: Secondary | ICD-10-CM

## 2021-11-27 DIAGNOSIS — E785 Hyperlipidemia, unspecified: Secondary | ICD-10-CM

## 2021-11-27 MED ORDER — METOPROLOL SUCCINATE ER 25 MG PO TB24: ORAL_TABLET | ORAL | 3 refills | Status: DC

## 2021-11-27 MED ORDER — ATORVASTATIN CALCIUM 80 MG PO TABS: 40.0000 mg | ORAL_TABLET | Freq: Every day | ORAL | 3 refills | Status: DC

## 2021-12-19 ENCOUNTER — Other Ambulatory Visit: Payer: Self-pay | Admitting: Family Medicine

## 2021-12-19 DIAGNOSIS — G4700 Insomnia, unspecified: Secondary | ICD-10-CM

## 2022-01-07 ENCOUNTER — Telehealth: Payer: Self-pay | Admitting: Family Medicine

## 2022-01-07 NOTE — Telephone Encounter (Signed)
Left message for patient to call back and schedule Medicare Annual Wellness Visit (AWV).   Please offer to do virtually or by telephone.  Left office number and my jabber #[phone].  AWVI eligible as of 01/14/2021  Please schedule at anytime with Nurse Health Advisor.   

## 2022-02-08 ENCOUNTER — Encounter: Payer: Self-pay | Admitting: Family Medicine

## 2022-02-08 ENCOUNTER — Ambulatory Visit (HOSPITAL_BASED_OUTPATIENT_CLINIC_OR_DEPARTMENT_OTHER)
Admission: RE | Admit: 2022-02-08 | Discharge: 2022-02-08 | Disposition: A | Discharge status: Home / Self Care | Payer: Medicare HMO | Source: Ambulatory Visit | Attending: Family Medicine | Admitting: Family Medicine

## 2022-02-08 ENCOUNTER — Ambulatory Visit (INDEPENDENT_AMBULATORY_CARE_PROVIDER_SITE_OTHER): Payer: Medicare HMO | Admitting: Family Medicine

## 2022-02-08 VITALS — BP 120/80 | HR 59 | Temp 97.7°F | Resp 18 | Ht 61.0 in | Wt 145.8 lb

## 2022-02-08 DIAGNOSIS — R69 Illness, unspecified: Secondary | ICD-10-CM | POA: Diagnosis not present

## 2022-02-08 DIAGNOSIS — R748 Abnormal levels of other serum enzymes: Secondary | ICD-10-CM | POA: Diagnosis not present

## 2022-02-08 DIAGNOSIS — M25552 Pain in left hip: Secondary | ICD-10-CM

## 2022-02-08 DIAGNOSIS — G8929 Other chronic pain: Secondary | ICD-10-CM | POA: Insufficient documentation

## 2022-02-08 DIAGNOSIS — M545 Low back pain, unspecified: Secondary | ICD-10-CM | POA: Diagnosis not present

## 2022-02-08 DIAGNOSIS — F4323 Adjustment disorder with mixed anxiety and depressed mood: Secondary | ICD-10-CM

## 2022-02-08 DIAGNOSIS — R918 Other nonspecific abnormal finding of lung field: Secondary | ICD-10-CM

## 2022-02-08 DIAGNOSIS — M25551 Pain in right hip: Secondary | ICD-10-CM | POA: Insufficient documentation

## 2022-02-08 MED ORDER — FLUOXETINE HCL 20 MG PO CAPS: 20.0000 mg | ORAL_CAPSULE | Freq: Every day | ORAL | 4 refills | Status: DC

## 2022-02-09 ENCOUNTER — Telehealth: Payer: Self-pay | Admitting: Cardiovascular Disease

## 2022-02-09 ENCOUNTER — Telehealth: Payer: Self-pay

## 2022-02-09 ENCOUNTER — Encounter: Payer: Self-pay | Admitting: Family Medicine

## 2022-02-09 DIAGNOSIS — M9903 Segmental and somatic dysfunction of lumbar region: Secondary | ICD-10-CM | POA: Diagnosis not present

## 2022-02-09 DIAGNOSIS — M9905 Segmental and somatic dysfunction of pelvic region: Secondary | ICD-10-CM | POA: Diagnosis not present

## 2022-02-09 DIAGNOSIS — M9904 Segmental and somatic dysfunction of sacral region: Secondary | ICD-10-CM | POA: Diagnosis not present

## 2022-02-09 DIAGNOSIS — M5136 Other intervertebral disc degeneration, lumbar region: Secondary | ICD-10-CM | POA: Diagnosis not present

## 2022-02-09 DIAGNOSIS — E785 Hyperlipidemia, unspecified: Secondary | ICD-10-CM

## 2022-02-09 MED ORDER — ATORVASTATIN CALCIUM 80 MG PO TABS: 40.0000 mg | ORAL_TABLET | Freq: Every day | ORAL | 3 refills | Status: DC

## 2022-02-11 ENCOUNTER — Telehealth: Payer: Self-pay | Admitting: Cardiovascular Disease

## 2022-02-11 ENCOUNTER — Other Ambulatory Visit: Payer: Self-pay

## 2022-02-11 DIAGNOSIS — R002 Palpitations: Secondary | ICD-10-CM

## 2022-02-11 DIAGNOSIS — M9905 Segmental and somatic dysfunction of pelvic region: Secondary | ICD-10-CM | POA: Diagnosis not present

## 2022-02-11 DIAGNOSIS — M5136 Other intervertebral disc degeneration, lumbar region: Secondary | ICD-10-CM | POA: Diagnosis not present

## 2022-02-11 DIAGNOSIS — E785 Hyperlipidemia, unspecified: Secondary | ICD-10-CM

## 2022-02-11 DIAGNOSIS — M9904 Segmental and somatic dysfunction of sacral region: Secondary | ICD-10-CM | POA: Diagnosis not present

## 2022-02-11 DIAGNOSIS — I25118 Atherosclerotic heart disease of native coronary artery with other forms of angina pectoris: Secondary | ICD-10-CM

## 2022-02-11 DIAGNOSIS — M9903 Segmental and somatic dysfunction of lumbar region: Secondary | ICD-10-CM | POA: Diagnosis not present

## 2022-02-11 DIAGNOSIS — I10 Essential (primary) hypertension: Secondary | ICD-10-CM

## 2022-02-11 MED ORDER — NITROGLYCERIN 0.4 MG SL SUBL: 0.4000 mg | SUBLINGUAL_TABLET | SUBLINGUAL | 2 refills | Status: DC | PRN

## 2022-02-11 MED ORDER — CLOPIDOGREL BISULFATE 75 MG PO TABS: 75.0000 mg | ORAL_TABLET | Freq: Every day | ORAL | 3 refills | Status: DC

## 2022-02-11 MED ORDER — METOPROLOL SUCCINATE ER 25 MG PO TB24: ORAL_TABLET | ORAL | 2 refills | Status: DC

## 2022-02-11 MED ORDER — AMLODIPINE BESYLATE 5 MG PO TABS: 5.0000 mg | ORAL_TABLET | Freq: Every day | ORAL | 2 refills | Status: DC

## 2022-02-11 MED ORDER — PANTOPRAZOLE SODIUM 40 MG PO TBEC: 40.0000 mg | DELAYED_RELEASE_TABLET | Freq: Every day | ORAL | 3 refills | Status: DC

## 2022-02-11 MED ORDER — ATORVASTATIN CALCIUM 80 MG PO TABS: 40.0000 mg | ORAL_TABLET | Freq: Every day | ORAL | 3 refills | Status: DC

## 2022-02-11 NOTE — Telephone Encounter (Signed)
   Pt is calling, she has new preferred pharmacy. She said, all her future medication refill should be sent to [facility] mail pharmacy fax# [phone]. She doesn't need refill now but wants this new pharmacy on her chart

## 2022-02-11 NOTE — Telephone Encounter (Signed)
Pharmacy is already on her list

## 2022-02-12 ENCOUNTER — Ambulatory Visit (HOSPITAL_BASED_OUTPATIENT_CLINIC_OR_DEPARTMENT_OTHER)
Admission: RE | Admit: 2022-02-12 | Discharge: 2022-02-12 | Disposition: A | Discharge status: Home / Self Care | Payer: Medicare HMO | Source: Ambulatory Visit | Attending: Family Medicine | Admitting: Family Medicine

## 2022-02-12 ENCOUNTER — Ambulatory Visit (HOSPITAL_BASED_OUTPATIENT_CLINIC_OR_DEPARTMENT_OTHER): Payer: Medicare HMO

## 2022-02-12 DIAGNOSIS — R918 Other nonspecific abnormal finding of lung field: Secondary | ICD-10-CM | POA: Diagnosis not present

## 2022-02-12 DIAGNOSIS — R911 Solitary pulmonary nodule: Secondary | ICD-10-CM | POA: Diagnosis not present

## 2022-02-14 ENCOUNTER — Encounter: Payer: Self-pay | Admitting: Family Medicine

## 2022-02-15 ENCOUNTER — Telehealth: Payer: Self-pay | Admitting: *Deleted

## 2022-02-15 DIAGNOSIS — M9903 Segmental and somatic dysfunction of lumbar region: Secondary | ICD-10-CM | POA: Diagnosis not present

## 2022-02-15 DIAGNOSIS — M9905 Segmental and somatic dysfunction of pelvic region: Secondary | ICD-10-CM | POA: Diagnosis not present

## 2022-02-15 DIAGNOSIS — F4323 Adjustment disorder with mixed anxiety and depressed mood: Secondary | ICD-10-CM

## 2022-02-15 DIAGNOSIS — J309 Allergic rhinitis, unspecified: Secondary | ICD-10-CM

## 2022-02-15 DIAGNOSIS — M9904 Segmental and somatic dysfunction of sacral region: Secondary | ICD-10-CM | POA: Diagnosis not present

## 2022-02-15 DIAGNOSIS — M5136 Other intervertebral disc degeneration, lumbar region: Secondary | ICD-10-CM | POA: Diagnosis not present

## 2022-02-15 DIAGNOSIS — G4700 Insomnia, unspecified: Secondary | ICD-10-CM

## 2022-02-15 NOTE — Telephone Encounter (Signed)
Left message for patient to call back.  We received [facility] from [facility].  Need to know if she is indeed switching to them and also is she on 1 or 2 tabs of fluoxetine '20mg'$ ?

## 2022-02-17 MED ORDER — FLUTICASONE PROPIONATE 50 MCG/ACT NA SUSP: NASAL | 1 refills | Status: DC

## 2022-02-17 MED ORDER — FLUOXETINE HCL 20 MG PO CAPS: 20.0000 mg | ORAL_CAPSULE | Freq: Every day | ORAL | 1 refills | Status: DC

## 2022-02-17 MED ORDER — TRAZODONE HCL 50 MG PO TABS: 25.0000 mg | ORAL_TABLET | Freq: Every evening | ORAL | 1 refills | Status: DC | PRN

## 2022-02-17 NOTE — Telephone Encounter (Signed)
Patient will be using mail order and she is using fluoxetine 1 tablet a day.  Rxs sent in for fluoxetine, fluticasone, and trazodone.

## 2022-02-18 ENCOUNTER — Ambulatory Visit (HOSPITAL_BASED_OUTPATIENT_CLINIC_OR_DEPARTMENT_OTHER)
Admission: RE | Admit: 2022-02-18 | Discharge: 2022-02-18 | Disposition: A | Discharge status: Home / Self Care | Payer: Medicare HMO | Source: Ambulatory Visit | Attending: Family Medicine | Admitting: Family Medicine

## 2022-02-18 DIAGNOSIS — K8020 Calculus of gallbladder without cholecystitis without obstruction: Secondary | ICD-10-CM | POA: Diagnosis not present

## 2022-02-18 DIAGNOSIS — R748 Abnormal levels of other serum enzymes: Secondary | ICD-10-CM | POA: Diagnosis not present

## 2022-02-19 ENCOUNTER — Encounter: Payer: Self-pay | Admitting: Family Medicine

## 2022-02-19 ENCOUNTER — Other Ambulatory Visit: Payer: Self-pay

## 2022-02-19 DIAGNOSIS — R748 Abnormal levels of other serum enzymes: Secondary | ICD-10-CM

## 2022-02-19 DIAGNOSIS — E785 Hyperlipidemia, unspecified: Secondary | ICD-10-CM

## 2022-02-19 DIAGNOSIS — R002 Palpitations: Secondary | ICD-10-CM

## 2022-02-19 DIAGNOSIS — I10 Essential (primary) hypertension: Secondary | ICD-10-CM

## 2022-02-19 DIAGNOSIS — I25118 Atherosclerotic heart disease of native coronary artery with other forms of angina pectoris: Secondary | ICD-10-CM

## 2022-02-19 MED ORDER — METOPROLOL SUCCINATE ER 25 MG PO TB24: ORAL_TABLET | ORAL | 2 refills | Status: DC

## 2022-02-19 MED ORDER — CLOPIDOGREL BISULFATE 75 MG PO TABS: 75.0000 mg | ORAL_TABLET | Freq: Every day | ORAL | 3 refills | Status: DC

## 2022-02-19 MED ORDER — NITROGLYCERIN 0.4 MG SL SUBL: 0.4000 mg | SUBLINGUAL_TABLET | SUBLINGUAL | 2 refills | Status: DC | PRN

## 2022-02-19 MED ORDER — ATORVASTATIN CALCIUM 80 MG PO TABS: 40.0000 mg | ORAL_TABLET | Freq: Every day | ORAL | 3 refills | Status: DC

## 2022-02-19 MED ORDER — AMLODIPINE BESYLATE 5 MG PO TABS: 5.0000 mg | ORAL_TABLET | Freq: Every day | ORAL | 1 refills | Status: DC

## 2022-02-19 MED ORDER — PANTOPRAZOLE SODIUM 40 MG PO TBEC: 40.0000 mg | DELAYED_RELEASE_TABLET | Freq: Every day | ORAL | 3 refills | Status: DC

## 2022-02-25 DIAGNOSIS — M9904 Segmental and somatic dysfunction of sacral region: Secondary | ICD-10-CM | POA: Diagnosis not present

## 2022-02-25 DIAGNOSIS — M9903 Segmental and somatic dysfunction of lumbar region: Secondary | ICD-10-CM | POA: Diagnosis not present

## 2022-02-25 DIAGNOSIS — M9905 Segmental and somatic dysfunction of pelvic region: Secondary | ICD-10-CM | POA: Diagnosis not present

## 2022-02-25 DIAGNOSIS — M5136 Other intervertebral disc degeneration, lumbar region: Secondary | ICD-10-CM | POA: Diagnosis not present

## 2022-03-01 DIAGNOSIS — M9904 Segmental and somatic dysfunction of sacral region: Secondary | ICD-10-CM | POA: Diagnosis not present

## 2022-03-01 DIAGNOSIS — M9903 Segmental and somatic dysfunction of lumbar region: Secondary | ICD-10-CM | POA: Diagnosis not present

## 2022-03-01 DIAGNOSIS — M9905 Segmental and somatic dysfunction of pelvic region: Secondary | ICD-10-CM | POA: Diagnosis not present

## 2022-03-01 DIAGNOSIS — M5136 Other intervertebral disc degeneration, lumbar region: Secondary | ICD-10-CM | POA: Diagnosis not present

## 2022-03-03 DIAGNOSIS — M5136 Other intervertebral disc degeneration, lumbar region: Secondary | ICD-10-CM | POA: Diagnosis not present

## 2022-03-03 DIAGNOSIS — M9904 Segmental and somatic dysfunction of sacral region: Secondary | ICD-10-CM | POA: Diagnosis not present

## 2022-03-03 DIAGNOSIS — M9905 Segmental and somatic dysfunction of pelvic region: Secondary | ICD-10-CM | POA: Diagnosis not present

## 2022-03-03 DIAGNOSIS — M9903 Segmental and somatic dysfunction of lumbar region: Secondary | ICD-10-CM | POA: Diagnosis not present

## 2022-03-24 DIAGNOSIS — M9903 Segmental and somatic dysfunction of lumbar region: Secondary | ICD-10-CM | POA: Diagnosis not present

## 2022-03-24 DIAGNOSIS — M9905 Segmental and somatic dysfunction of pelvic region: Secondary | ICD-10-CM | POA: Diagnosis not present

## 2022-03-24 DIAGNOSIS — M5136 Other intervertebral disc degeneration, lumbar region: Secondary | ICD-10-CM | POA: Diagnosis not present

## 2022-03-24 DIAGNOSIS — M9904 Segmental and somatic dysfunction of sacral region: Secondary | ICD-10-CM | POA: Diagnosis not present

## 2022-03-30 DIAGNOSIS — M9905 Segmental and somatic dysfunction of pelvic region: Secondary | ICD-10-CM | POA: Diagnosis not present

## 2022-03-30 DIAGNOSIS — M5136 Other intervertebral disc degeneration, lumbar region: Secondary | ICD-10-CM | POA: Diagnosis not present

## 2022-03-30 DIAGNOSIS — M9904 Segmental and somatic dysfunction of sacral region: Secondary | ICD-10-CM | POA: Diagnosis not present

## 2022-03-30 DIAGNOSIS — M9903 Segmental and somatic dysfunction of lumbar region: Secondary | ICD-10-CM | POA: Diagnosis not present

## 2022-04-01 DIAGNOSIS — M5136 Other intervertebral disc degeneration, lumbar region: Secondary | ICD-10-CM | POA: Diagnosis not present

## 2022-04-01 DIAGNOSIS — M9903 Segmental and somatic dysfunction of lumbar region: Secondary | ICD-10-CM | POA: Diagnosis not present

## 2022-04-01 DIAGNOSIS — M9905 Segmental and somatic dysfunction of pelvic region: Secondary | ICD-10-CM | POA: Diagnosis not present

## 2022-04-01 DIAGNOSIS — M9904 Segmental and somatic dysfunction of sacral region: Secondary | ICD-10-CM | POA: Diagnosis not present

## 2022-04-05 DIAGNOSIS — M9903 Segmental and somatic dysfunction of lumbar region: Secondary | ICD-10-CM | POA: Diagnosis not present

## 2022-04-05 DIAGNOSIS — M5136 Other intervertebral disc degeneration, lumbar region: Secondary | ICD-10-CM | POA: Diagnosis not present

## 2022-04-05 DIAGNOSIS — M9905 Segmental and somatic dysfunction of pelvic region: Secondary | ICD-10-CM | POA: Diagnosis not present

## 2022-04-05 DIAGNOSIS — M9904 Segmental and somatic dysfunction of sacral region: Secondary | ICD-10-CM | POA: Diagnosis not present

## 2022-04-08 DIAGNOSIS — M9903 Segmental and somatic dysfunction of lumbar region: Secondary | ICD-10-CM | POA: Diagnosis not present

## 2022-04-08 DIAGNOSIS — M5136 Other intervertebral disc degeneration, lumbar region: Secondary | ICD-10-CM | POA: Diagnosis not present

## 2022-04-08 DIAGNOSIS — M9904 Segmental and somatic dysfunction of sacral region: Secondary | ICD-10-CM | POA: Diagnosis not present

## 2022-04-08 DIAGNOSIS — M9905 Segmental and somatic dysfunction of pelvic region: Secondary | ICD-10-CM | POA: Diagnosis not present

## 2022-05-11 ENCOUNTER — Telehealth: Payer: Self-pay

## 2022-05-11 NOTE — Progress Notes (Unsigned)
[place] [facility] [address], [place], [place] [address] [phone]  Date:  05/12/2022   Name:  [name]   DOB:  04/29/1951   MRN:  [id number]  PCP:  Darreld Mclean, MD    Chief Complaint: No chief complaint on file.   History of Present Illness:  [name] is a 71 y.o. very pleasant female patient who presents with the following:  Patient seen today on a semiurgent basis, see phone call from yesterday Most recent visit with myself was in June History of CAD, hyperlipidemia, breast cancer  She is followed by cardiology, Dr. Claiborne Billings  Patient Active Problem List   Diagnosis Date Noted   Fatigue associated with anemia 06/13/2018   Depression 06/13/2018   RLS (restless legs syndrome) 04/05/2018   Atypical chest pain 03/10/2018   Elevated blood pressure reading 03/10/2018   Palpitations 03/10/2018   Chest pain 03/09/2018   Hyperlipidemia 03/08/2018   Coronary artery disease involving native coronary artery of native heart with unstable angina pectoris ([place]) 03/07/2018   Unstable angina (HCC)    Cough 01/02/2013   Anxiety state 01/02/2013   Breast cancer (DCIS), stage 0, Left, receptor +, dx 2012 09/08/2010    Past Medical History:  Diagnosis Date   Allergy    "dust mite residue, mold, mildew, grapefruit, chili peppers, cat dander, some weeds" (03/07/2018)   Anxiety    Arthritis    "minor; right knee, left hip" (03/07/2018)   Bruises easily    Cancer of left breast ([place])    double mastectomy 01/26/2011   Childhood asthma    Contact lens/glasses fitting    Coronary artery disease    a.  She underwent LHC 7/23 which revealed 95% stenosis of proximal LAD, managed with PCI/DES.    Depression    Essential hypertension    GERD (gastroesophageal reflux disease)    "at times" (03/07/2018)   Heart murmur    Hiatal hernia    High cholesterol    History of kidney stones    "lots; no OR" (03/07/2018)    Osteoporosis    Rectal bleeding    hemorroid   Ringing in ears, bilateral    "since ~ 06/2017" (03/07/2018)   Sinus drainage     Past Surgical History:  Procedure Laterality Date   APPENDECTOMY     BREAST LUMPECTOMY W/ NEEDLE LOCALIZATION Left 10/05/2010   lumpectomy   BREAST SURGERY     CORONARY STENT INTERVENTION N/A 03/07/2018   Procedure: CORONARY STENT INTERVENTION;  Surgeon: Troy Sine, MD;  Location: [facility];  Service: Cardiovascular;  Laterality: N/A;   LEFT HEART CATH AND CORONARY ANGIOGRAPHY N/A 03/07/2018   Procedure: LEFT HEART CATH AND CORONARY ANGIOGRAPHY;  Surgeon: Troy Sine, MD;  Location: [facility];  Service: Cardiovascular;  Laterality: N/A;   MASTECTOMY Right 01/26/2011   MASTECTOMY COMPLETE / SIMPLE W/ SENTINEL NODE BIOPSY Left 01/26/2011   OVARIAN CYST SURGERY  1997, 2001, 2008    Social History   Tobacco Use   Smoking status: Never   Smokeless tobacco: Never  Vaping Use   Vaping Use: Never used  Substance Use Topics   Alcohol use: Yes    Comment: 03/07/2018 "probably once/month"   Drug use: No    Family History  Problem Relation Age of Onset   Hypertension Mother    Heart Problems Mother    Hyperlipidemia Mother  Hypotension Father    Other Father        pacemaker   Heart failure Paternal Grandmother    Heart failure Paternal Grandfather    Cancer Brother    Cancer Sister    Diabetes Sister     Allergies  Allergen Reactions   Other     Grapefruit   Elavil [Amitriptyline Hcl] Rash   Elemental Sulfur Rash    Medication list has been reviewed and updated.  Current Outpatient Medications on File Prior to Visit  Medication Sig Dispense Refill   albuterol (VENTOLIN HFA) 108 (90 Base) MCG/ACT inhaler Inhale 1-2 puffs into the lungs every 4 (four) hours as needed for wheezing or shortness of breath. 18 g 0   alendronate (FOSAMAX) 70 MG tablet      amLODipine (NORVASC) 5 MG tablet Take 1 tablet (5 mg total) by  mouth daily. 180 tablet 1   aspirin EC 81 MG tablet Take 1 tablet (81 mg total) by mouth daily. 90 tablet 3   atorvastatin (LIPITOR) 80 MG tablet Take 0.5 tablets (40 mg total) by mouth daily at 6 PM. 45 tablet 3   cetirizine (ZYRTEC) 10 MG tablet Take 10 mg by mouth daily as needed for allergies.      clopidogrel (PLAVIX) 75 MG tablet Take 1 tablet (75 mg total) by mouth daily. 90 tablet 3   FLUoxetine (PROZAC) 20 MG capsule Take 1 capsule (20 mg total) by mouth daily. 90 capsule 1   fluticasone (FLONASE) 50 MCG/ACT nasal spray USE 2 SPRAYS IN EACH NOSTRIL EVERY DAY AS NEEDED 48 g 1   metoprolol succinate (TOPROL-XL) 25 MG 24 hr tablet TAKE 1/2 TABLET EVERY DAY 45 tablet 2   montelukast (SINGULAIR) 10 MG tablet Take 10 mg by mouth at bedtime.     nitroGLYCERIN (NITROSTAT) 0.4 MG SL tablet Place 1 tablet (0.4 mg total) under the tongue every 5 (five) minutes as needed for chest pain. 25 tablet 2   pantoprazole (PROTONIX) 40 MG tablet Take 1 tablet (40 mg total) by mouth daily. 90 tablet 3   traZODone (DESYREL) 50 MG tablet Take 0.5 tablets (25 mg total) by mouth at bedtime as needed for sleep. 45 tablet 1   No current facility-administered medications on file prior to visit.    Review of Systems:  As per HPI- otherwise negative.   Physical Examination: There were no vitals filed for this visit. There were no vitals filed for this visit. There is no height or weight on file to calculate BMI. Ideal Body Weight:    GEN: no acute distress. HEENT: Atraumatic, Normocephalic.  Ears and Nose: No external deformity. CV: RRR, No M/G/R. No JVD. No thrill. No extra heart sounds. PULM: CTA B, no wheezes, crackles, rhonchi. No retractions. No resp. distress. No accessory muscle use. ABD: S, NT, ND, +BS. No rebound. No HSM. EXTR: No c/c/e PSYCH: Normally interactive. Conversant.    Assessment and Plan: ***  Signed Lamar Blinks, MD

## 2022-05-11 NOTE — Telephone Encounter (Signed)
Pt scheduled tomorrow at 3pm

## 2022-05-11 NOTE — Telephone Encounter (Signed)
Nurse Assessment Nurse: Sheppard Plumber, RN, Estill Bamberg Date/Time (Eastern Time): 05/11/2022 8:52:20 AM Confirm and document reason for call. If symptomatic, describe symptoms. ---caller states she is having headaches. says it is a squeezing feeling and comes and goes in waves. problems with her neck as well. saw chiropractor for hip and back pain r/t to fall last year. Does the patient have any new or worsening symptoms? ---Yes Will a triage be completed? ---Yes Related visit to physician within the last 2 weeks? ---No Does the PT have any chronic conditions? (i.e. diabetes, asthma, this includes High risk factors for pregnancy, etc.) ---Yes List chronic conditions. ---osteoporosis, cardiac stent, Is this a behavioral health or substance abuse call? ---No Guidelines Guideline Title Affirmed Question Affirmed Notes Nurse Date/Time (Eastern Time) Headache [1] MODERATE headache (e.g., interferes with normal activities) AND [2] present > 24 hours AND [3] unexplained (Exceptions: analgesics not tried, typical migraine, or Humfleet, RN, Estill Bamberg 05/11/2022 8:57:31 AM PLEASE NOTE: All timestamps contained within this report are represented as [place] Standard Time. CONFIDENTIALTY NOTICE: This fax transmission is intended only for the addressee. It contains information that is legally privileged, confidential or otherwise protected from use or disclosure. If you are not the intended recipient, you are strictly prohibited from reviewing, disclosing, copying using or disseminating any of this information or taking any action in reliance on or regarding this information. If you have received this fax in error, please notify us immediately by telephone so that we can arrange for its return to [place]. Phone: [phone], Toll-Free: [phone], Fax: [phone] Page: 2 of 2 Call Id: [id number] Guidelines Guideline Title Affirmed Question Affirmed Notes Nurse Date/Time Eilene Ghazi Time) headache part of  viral illness) Neck Pain or Stiffness [1] MODERATE neck pain (e.g., interferes with normal activities) AND [2] present > 3 days Hum[place]et, RN, Estill Bamberg 05/11/2022 9:00:03 AM Disp. Time Eilene Ghazi Time) Disposition Final User 05/11/2022 8:59:50 AM See PCP within 24 Hours [place], RN, Estill Bamberg 05/11/2022 9:02:39 AM SEE PCP WITHIN 3 DAYS Yes Humfleet, RN, Estill Bamberg Final Disposition 05/11/2022 9:02:39 AM SEE PCP WITHIN 3 DAYS Yes [place], RN, Shelly Coss Disagree/Comply Comply Caller Understands Yes PreDisposition Did not know what to do Care Advice Given Per Guideline SEE PCP WITHIN 24 HOURS: * IF OFFICE WILL BE OPEN: You need to be examined within the next 24 hours. Call your doctor (or NP/PA) when the office opens and make an appointment. CARE ADVICE given per Headache (Adult) guideline. * You become worse CALL BACK IF: SEE PCP WITHIN 3 DAYS: * You need to be seen within 2 or 3 days. CARE ADVICE given per Neck Pain (Adult) guideline. * You become worse CALL BACK IF: Referrals REFERRED TO PCP OFFICE

## 2022-05-12 ENCOUNTER — Encounter: Payer: Self-pay | Admitting: Family Medicine

## 2022-05-12 ENCOUNTER — Ambulatory Visit (HOSPITAL_BASED_OUTPATIENT_CLINIC_OR_DEPARTMENT_OTHER)
Admission: RE | Admit: 2022-05-12 | Discharge: 2022-05-12 | Disposition: A | Discharge status: Home / Self Care | Payer: Medicare HMO | Source: Ambulatory Visit | Attending: Family Medicine | Admitting: Family Medicine

## 2022-05-12 ENCOUNTER — Ambulatory Visit (INDEPENDENT_AMBULATORY_CARE_PROVIDER_SITE_OTHER): Payer: Medicare HMO | Admitting: Family Medicine

## 2022-05-12 VITALS — BP 110/60 | HR 61 | Resp 18 | Ht 61.0 in | Wt 146.0 lb

## 2022-05-12 DIAGNOSIS — M542 Cervicalgia: Secondary | ICD-10-CM | POA: Insufficient documentation

## 2022-05-12 DIAGNOSIS — R519 Headache, unspecified: Secondary | ICD-10-CM

## 2022-05-12 DIAGNOSIS — G8929 Other chronic pain: Secondary | ICD-10-CM | POA: Insufficient documentation

## 2022-05-12 NOTE — Patient Instructions (Addendum)
We will get x-rays of your neck today I will also be in touch with your labs asap It looks like your endocrinology referral was sent to [facility] in July- can you please give them a call and schedule?  [address], [place], [place], [place] [address] [phone]  IF your headache or other symptoms are escalating significantly again please seek care- the ER at [facility] or [facility] can do an MRI in house if needed

## 2022-05-13 ENCOUNTER — Encounter: Payer: Self-pay | Admitting: Family Medicine

## 2022-05-13 LAB — SEDIMENTATION RATE: Sed Rate: 2 mm/hr (ref 0–30)

## 2022-05-13 LAB — C-REACTIVE PROTEIN: CRP: <1 mg/dL (ref 0.5–20.0)

## 2022-05-24 ENCOUNTER — Encounter (INDEPENDENT_AMBULATORY_CARE_PROVIDER_SITE_OTHER): Payer: Medicare HMO | Admitting: Family Medicine

## 2022-05-24 ENCOUNTER — Ambulatory Visit: Payer: Medicare HMO | Admitting: Family Medicine

## 2022-05-24 DIAGNOSIS — J040 Acute laryngitis: Secondary | ICD-10-CM

## 2022-05-24 DIAGNOSIS — J4520 Mild intermittent asthma, uncomplicated: Secondary | ICD-10-CM

## 2022-05-24 DIAGNOSIS — G8929 Other chronic pain: Secondary | ICD-10-CM

## 2022-05-25 MED ORDER — PREDNISONE 20 MG PO TABS: ORAL_TABLET | ORAL | 0 refills | Status: DC

## 2022-05-25 NOTE — Telephone Encounter (Signed)
Please see the MyChart message reply(ies) for my assessment and plan.  The patient gave consent for this Medical Advice Message and is aware that it may result in a bill to their insurance company as well as the possibility that this may result in a co-payment or deductible. They are an established patient, but are not seeking medical advice exclusively about a problem treated during an in person or video visit in the last 7 days. I did not recommend an in person or video visit within 7 days of my reply.  I spent a total of 5 minutes cumulative time within 7 days through [place] messaging Lamar Blinks, MD

## 2022-05-27 DIAGNOSIS — H2513 Age-related nuclear cataract, bilateral: Secondary | ICD-10-CM | POA: Diagnosis not present

## 2022-05-27 DIAGNOSIS — H43393 Other vitreous opacities, bilateral: Secondary | ICD-10-CM | POA: Diagnosis not present

## 2022-05-27 DIAGNOSIS — H0102B Squamous blepharitis left eye, upper and lower eyelids: Secondary | ICD-10-CM | POA: Diagnosis not present

## 2022-05-27 DIAGNOSIS — H04123 Dry eye syndrome of bilateral lacrimal glands: Secondary | ICD-10-CM | POA: Diagnosis not present

## 2022-05-27 DIAGNOSIS — H5000 Unspecified esotropia: Secondary | ICD-10-CM | POA: Diagnosis not present

## 2022-05-27 DIAGNOSIS — H0102A Squamous blepharitis right eye, upper and lower eyelids: Secondary | ICD-10-CM | POA: Diagnosis not present

## 2022-05-27 DIAGNOSIS — H1045 Other chronic allergic conjunctivitis: Secondary | ICD-10-CM | POA: Diagnosis not present

## 2022-05-28 MED ORDER — MONTELUKAST SODIUM 10 MG PO TABS: 10.0000 mg | ORAL_TABLET | Freq: Every day | ORAL | 5 refills | Status: DC

## 2022-05-28 MED ORDER — ALBUTEROL SULFATE HFA 108 (90 BASE) MCG/ACT IN AERS: 1.0000 | INHALATION_SPRAY | RESPIRATORY_TRACT | 6 refills | Status: AC | PRN

## 2022-05-28 NOTE — Addendum Note (Signed)
Addended by: Lamar Blinks C on: 05/28/2022 03:15 PM   Modules accepted: Orders

## 2022-05-29 NOTE — Addendum Note (Signed)
Addended by: Lamar Blinks C on: 05/29/2022 03:22 PM   Modules accepted: Orders

## 2022-06-07 ENCOUNTER — Encounter: Payer: Self-pay | Admitting: Family Medicine

## 2022-07-26 ENCOUNTER — Encounter: Payer: Self-pay | Admitting: Family Medicine

## 2022-07-27 NOTE — Patient Instructions (Incomplete)
I hope you are feeling back to normal soon!  I do recommend an annual flu shot, COVID-19 booster, shingles vaccine once you are well  Blood work today- take home the stool collection kit, and if the diarrhea should return you can collect a sample at home for testing

## 2022-07-27 NOTE — Progress Notes (Unsigned)
[place] at [facility] [address] [place], [place] [address] [phone]  Date:  07/28/2022   Name:  [name]   DOB:  1950-12-03   MRN:  [id number]  PCP:  Darreld Mclean, MD    Chief Complaint: No chief complaint on file.   History of Present Illness:  [name] is a 71 y.o. very pleasant female patient who presents with the following:  Patient contacted me earlier this week via MyChart with concern of diarrhea/food poisoning type symptoms Most recent visit with myself was in September- History of CAD, hyperlipidemia, breast cancer  MyChart message sent on 12/11 I have been ill for several days. I've had vomiting, diarrhea, stomach cramps, headache, chills, but no fever. I've tested negative for Covid. I took that test at least two or more days after the onset of the symptoms. I've had little to eat since Thursday last week. I haven't left the house in all this time. My stomach is no longer cramping but the watery diarrhea continues. I took around 3 doses of Pepto Bismal. My stool (sludge) became really dark and scary, but then I remembered that Pepto can do that. It is returning to a more normal color today. I really think I'm past the worst of it but a little concerned that I still have to keep going so much when I'm not really eating. I ate one small piece of pizza last night and took off the cheese. I've had a lot more reflux lately and irregular bowels before all this started. I was thinking a stomach bug but usually I get over something like that in a day or two. I feel terrible but this is the best day I've had in 5. I have an engagement on Wednesday I need to keep. What are your thoughts?   Flu vaccine COVID booster recommended Recommend shingles vaccine  Patient Active Problem List   Diagnosis Date Noted   Fatigue associated with anemia 06/13/2018   Depression 06/13/2018   RLS (restless legs syndrome)  04/05/2018   Atypical chest pain 03/10/2018   Elevated blood pressure reading 03/10/2018   Palpitations 03/10/2018   Chest pain 03/09/2018   Hyperlipidemia 03/08/2018   Coronary artery disease involving native coronary artery of native heart with unstable angina pectoris ([place]) 03/07/2018   Unstable angina (HCC)    Cough 01/02/2013   Anxiety state 01/02/2013   Breast cancer (DCIS), stage 0, Left, receptor +, dx 2012 09/08/2010    Past Medical History:  Diagnosis Date   Allergy    "dust mite residue, mold, mildew, grapefruit, chili peppers, cat dander, some weeds" (03/07/2018)   Anxiety    Arthritis    "minor; right knee, left hip" (03/07/2018)   Bruises easily    Cancer of left breast ([place])    double mastectomy 01/26/2011   Childhood asthma    Contact lens/glasses fitting    Coronary artery disease    a.  She underwent LHC 7/23 which revealed 95% stenosis of proximal LAD, managed with PCI/DES.    Depression    Essential hypertension    GERD (gastroesophageal reflux disease)    "at times" (03/07/2018)   Heart murmur    Hiatal hernia    High cholesterol    History of kidney stones    "lots; no OR" (03/07/2018)   Osteoporosis    Rectal bleeding    hemorroid   Ringing in ears, bilateral    "  since ~ 06/2017" (03/07/2018)   Sinus drainage     Past Surgical History:  Procedure Laterality Date   APPENDECTOMY     BREAST LUMPECTOMY W/ NEEDLE LOCALIZATION Left 10/05/2010   lumpectomy   BREAST SURGERY     CORONARY STENT INTERVENTION N/A 03/07/2018   Procedure: CORONARY STENT INTERVENTION;  Surgeon: Troy Sine, MD;  Location: [place] [facility];  Service: Cardiovascular;  Laterality: N/A;   LEFT HEART CATH AND CORONARY ANGIOGRAPHY N/A 03/07/2018   Procedure: LEFT HEART CATH AND CORONARY ANGIOGRAPHY;  Surgeon: Troy Sine, MD;  Location: [place] [facility];  Service: Cardiovascular;  Laterality: N/A;   MASTECTOMY Right 01/26/2011   MASTECTOMY COMPLETE / SIMPLE W/ SENTINEL  NODE BIOPSY Left 01/26/2011   OVARIAN CYST SURGERY  1997, 2001, 2008    Social History   Tobacco Use   Smoking status: Never   Smokeless tobacco: Never  Vaping Use   Vaping Use: Never used  Substance Use Topics   Alcohol use: Yes    Comment: 03/07/2018 "probably once/month"   Drug use: No    Family History  Problem Relation Age of Onset   Hypertension Mother    Heart Problems Mother    Hyperlipidemia Mother    Hypotension Father    Other Father        pacemaker   Heart failure Paternal Grandmother    Heart failure Paternal Grandfather    Cancer Brother    Cancer Sister    Diabetes Sister     Allergies  Allergen Reactions   Other     Grapefruit   Elavil [Amitriptyline Hcl] Rash   Elemental Sulfur Rash    Medication list has been reviewed and updated.  Current Outpatient Medications on File Prior to Visit  Medication Sig Dispense Refill   albuterol (VENTOLIN HFA) 108 (90 Base) MCG/ACT inhaler Inhale 1-2 puffs into the lungs every 4 (four) hours as needed for wheezing or shortness of breath. 18 g 6   alendronate (FOSAMAX) 70 MG tablet      amLODipine (NORVASC) 5 MG tablet Take 1 tablet (5 mg total) by mouth daily. 180 tablet 1   aspirin EC 81 MG tablet Take 1 tablet (81 mg total) by mouth daily. 90 tablet 3   atorvastatin (LIPITOR) 80 MG tablet Take 0.5 tablets (40 mg total) by mouth daily at 6 PM. 45 tablet 3   cetirizine (ZYRTEC) 10 MG tablet Take 10 mg by mouth daily as needed for allergies.      clopidogrel (PLAVIX) 75 MG tablet Take 1 tablet (75 mg total) by mouth daily. 90 tablet 3   FLUoxetine (PROZAC) 20 MG capsule Take 1 capsule (20 mg total) by mouth daily. 90 capsule 1   fluticasone (FLONASE) 50 MCG/ACT nasal spray USE 2 SPRAYS IN EACH NOSTRIL EVERY DAY AS NEEDED 48 g 1   metoprolol succinate (TOPROL-XL) 25 MG 24 hr tablet TAKE 1/2 TABLET EVERY DAY 45 tablet 2   montelukast (SINGULAIR) 10 MG tablet Take 1 tablet (10 mg total) by mouth at bedtime. 30 tablet  5   nitroGLYCERIN (NITROSTAT) 0.4 MG SL tablet Place 1 tablet (0.4 mg total) under the tongue every 5 (five) minutes as needed for chest pain. 25 tablet 2   pantoprazole (PROTONIX) 40 MG tablet Take 1 tablet (40 mg total) by mouth daily. 90 tablet 3   predniSONE (DELTASONE) 20 MG tablet Take 40 mg po once daily for 3 days, then 20 mg once daily for 3 days  if needed 9 tablet 0   traZODone (DESYREL) 50 MG tablet Take 0.5 tablets (25 mg total) by mouth at bedtime as needed for sleep. 45 tablet 1   No current facility-administered medications on file prior to visit.    Review of Systems:  As per HPI- otherwise negative.   Physical Examination: There were no vitals filed for this visit. There were no vitals filed for this visit. There is no height or weight on file to calculate BMI. Ideal Body Weight:    GEN: no acute distress. HEENT: Atraumatic, Normocephalic.  Ears and Nose: No external deformity. CV: RRR, No M/G/R. No JVD. No thrill. No extra heart sounds. PULM: CTA B, no wheezes, crackles, rhonchi. No retractions. No resp. distress. No accessory muscle use. ABD: S, NT, ND, +BS. No rebound. No HSM. EXTR: No c/c/e PSYCH: Normally interactive. Conversant.    Assessment and Plan: ***  Signed Lamar Blinks, MD

## 2022-07-28 ENCOUNTER — Encounter: Payer: Self-pay | Admitting: Family Medicine

## 2022-07-28 ENCOUNTER — Ambulatory Visit (INDEPENDENT_AMBULATORY_CARE_PROVIDER_SITE_OTHER): Payer: Medicare HMO | Admitting: Family Medicine

## 2022-07-28 VITALS — BP 118/62 | HR 70 | Temp 98.1°F | Resp 16 | Ht 63.0 in | Wt 141.6 lb

## 2022-07-28 DIAGNOSIS — R197 Diarrhea, unspecified: Secondary | ICD-10-CM | POA: Diagnosis not present

## 2022-07-28 LAB — COMPREHENSIVE METABOLIC PANEL
ALT: 24 U/L (ref 0–35)
AST: 32 U/L (ref 0–37)
Albumin: 4.2 g/dL (ref 3.5–5.2)
Alkaline Phosphatase: 96 U/L (ref 39–117)
BUN: 9 mg/dL (ref 6–23)
CO2: 27 mEq/L (ref 19–32)
Calcium: 9.3 mg/dL (ref 8.4–10.5)
Chloride: 104 mEq/L (ref 96–112)
Creatinine, Ser: 0.77 mg/dL (ref 0.40–1.20)
GFR: 77.85 mL/min (ref >60.00)
Glucose, Bld: 84 mg/dL (ref 70–99)
Potassium: 3.3 mEq/L — ABNORMAL LOW (ref 3.5–5.1)
Sodium: 143 mEq/L (ref 135–145)
Total Bilirubin: 1.4 mg/dL — ABNORMAL HIGH (ref 0.2–1.2)
Total Protein: 6.2 g/dL (ref 6.0–8.3)

## 2022-07-28 LAB — CBC
HCT: 38.4 % (ref 36.0–46.0)
Hemoglobin: 13.2 g/dL (ref 12.0–15.0)
MCHC: 34.3 g/dL (ref 30.0–36.0)
MCV: 93.1 fl (ref 78.0–100.0)
Platelets: 244 10*3/uL (ref 150.0–400.0)
RBC: 4.13 Mil/uL (ref 3.87–5.11)
RDW: 13.5 % (ref 11.5–15.5)
WBC: 5.2 10*3/uL (ref 4.0–10.5)

## 2022-08-01 ENCOUNTER — Encounter: Payer: Self-pay | Admitting: Family Medicine

## 2022-08-02 NOTE — Telephone Encounter (Signed)
Reviewed requirement for c. Diff at [facility] and it does need refrigeration. Spoke with pt. The 24 hr window is preferred but should still be stable if she can return it to [place] first thing tomorrow morning.  Notified pt and she will bring specimen to [place] in the morning.

## 2022-08-02 NOTE — Telephone Encounter (Signed)
Not all samples need to be refrigerated, should she dispose?

## 2022-08-03 ENCOUNTER — Telehealth: Payer: Self-pay | Admitting: *Deleted

## 2022-08-03 ENCOUNTER — Other Ambulatory Visit: Payer: Medicare HMO

## 2022-08-03 DIAGNOSIS — R197 Diarrhea, unspecified: Secondary | ICD-10-CM

## 2022-08-03 NOTE — Telephone Encounter (Signed)
Who Is Calling Patient / Member / Family / Caregiver Caller Name Sherald Balbuena Caller Phone Number [phone] Patient Name [name] Patient DOB April 02, 1951 Call Type Message Only Information Provided Reason for Call Request for General Office Information Initial Comment caller missed call from the office, they left message about stool sample and she's needing to get more details Disp. Time Disposition Final User 08/03/2022 7:20:08 AM General Information Provided Yes Kenton Kingfisher, Lanette

## 2022-08-03 NOTE — Telephone Encounter (Signed)
Pt brought specimen this morning.

## 2022-08-03 NOTE — Telephone Encounter (Signed)
Error

## 2022-08-05 ENCOUNTER — Encounter: Payer: Self-pay | Admitting: Family Medicine

## 2022-08-05 LAB — CLOSTRIDIUM DIFFICILE BY PCR: Toxigenic C. Difficile by PCR: NEGATIVE

## 2022-08-13 DIAGNOSIS — J45909 Unspecified asthma, uncomplicated: Secondary | ICD-10-CM | POA: Diagnosis not present

## 2022-08-13 DIAGNOSIS — Z803 Family history of malignant neoplasm of breast: Secondary | ICD-10-CM | POA: Diagnosis not present

## 2022-08-13 DIAGNOSIS — M810 Age-related osteoporosis without current pathological fracture: Secondary | ICD-10-CM | POA: Diagnosis not present

## 2022-08-13 DIAGNOSIS — Z7983 Long term (current) use of bisphosphonates: Secondary | ICD-10-CM | POA: Diagnosis not present

## 2022-08-13 DIAGNOSIS — Z8489 Family history of other specified conditions: Secondary | ICD-10-CM | POA: Diagnosis not present

## 2022-08-13 DIAGNOSIS — I2510 Atherosclerotic heart disease of native coronary artery without angina pectoris: Secondary | ICD-10-CM | POA: Diagnosis not present

## 2022-08-13 DIAGNOSIS — I739 Peripheral vascular disease, unspecified: Secondary | ICD-10-CM | POA: Diagnosis not present

## 2022-08-13 DIAGNOSIS — R69 Illness, unspecified: Secondary | ICD-10-CM | POA: Diagnosis not present

## 2022-08-13 DIAGNOSIS — I10 Essential (primary) hypertension: Secondary | ICD-10-CM | POA: Diagnosis not present

## 2022-08-13 DIAGNOSIS — Z882 Allergy status to sulfonamides status: Secondary | ICD-10-CM | POA: Diagnosis not present

## 2022-08-13 DIAGNOSIS — Z7902 Long term (current) use of antithrombotics/antiplatelets: Secondary | ICD-10-CM | POA: Diagnosis not present

## 2022-09-28 ENCOUNTER — Encounter (INDEPENDENT_AMBULATORY_CARE_PROVIDER_SITE_OTHER): Payer: Medicare HMO | Admitting: Family Medicine

## 2022-09-28 DIAGNOSIS — R062 Wheezing: Secondary | ICD-10-CM | POA: Diagnosis not present

## 2022-09-28 DIAGNOSIS — R059 Cough, unspecified: Secondary | ICD-10-CM

## 2022-09-28 DIAGNOSIS — Z9109 Other allergy status, other than to drugs and biological substances: Secondary | ICD-10-CM

## 2022-09-28 MED ORDER — PREDNISONE 20 MG PO TABS: ORAL_TABLET | ORAL | 0 refills | Status: DC

## 2022-09-28 NOTE — Telephone Encounter (Signed)

## 2022-11-12 NOTE — Progress Notes (Unsigned)
[place] at [facility] [address], [place], [place] [address] [phone]  Date:  11/15/2022   Name:  [name]   DOB:  08-04-1951   MRN:  [id number]  PCP:  Darreld Mclean, MD    Chief Complaint: URI (Cough x 1 week, congestion, postnasal drip, runny eyes. Vernie Shanks tried: OTC cold meds. )   History of Present Illness:  [name] is a 72 y.o. very pleasant female patient who presents with the following:  Patient seen today with concern of joint pain-arrival she actually has concern of illness today and does not wish to discuss her joint pain right now Most recent visit with myself was in December when she was sick with diarrheal illness  Last September she was having neck pain.  Plain cervical spine films showed severe multilevel degenerative disc disease.  I suggested proceeding to MRI but she did not follow-up with me as of yet.  In any case her neck is doing better   Otherwise history of CAD, hyperlipidemia, breast cancer 2012  Can do hepatitis C screening next time we draw blood   Today she has cold sx for about one week Her grandson had a runny nose and cough- then her husband and she got it She noted sx of scratchy throat, sinus congestion, PND, sneezing, cough- but not severe  No vomiting or diarrhea No fever She is using OTC meds- tylenol has helped with her joint pains    Patient Active Problem List   Diagnosis Date Noted   Fatigue associated with anemia 06/13/2018   Depression 06/13/2018   RLS (restless legs syndrome) 04/05/2018   Atypical chest pain 03/10/2018   Elevated blood pressure reading 03/10/2018   Palpitations 03/10/2018   Chest pain 03/09/2018   Hyperlipidemia 03/08/2018   Coronary artery disease involving native coronary artery of native heart with unstable angina pectoris 03/07/2018   Unstable angina    Cough 01/02/2013   Anxiety state 01/02/2013   Breast cancer (DCIS), stage 0, Left,  receptor +, dx 2012 09/08/2010    Past Medical History:  Diagnosis Date   Allergy    "dust mite residue, mold, mildew, grapefruit, chili peppers, cat dander, some weeds" (03/07/2018)   Anxiety    Arthritis    "minor; right knee, left hip" (03/07/2018)   Bruises easily    Cancer of left breast ([place])    double mastectomy 01/26/2011   Childhood asthma    Contact lens/glasses fitting    Coronary artery disease    a.  She underwent LHC 7/23 which revealed 95% stenosis of proximal LAD, managed with PCI/DES.    Depression    Essential hypertension    GERD (gastroesophageal reflux disease)    "at times" (03/07/2018)   Heart murmur    Hiatal hernia    High cholesterol    History of kidney stones    "lots; no OR" (03/07/2018)   Osteoporosis    Rectal bleeding    hemorroid   Ringing in ears, bilateral    "since ~ 06/2017" (03/07/2018)   Sinus drainage     Past Surgical History:  Procedure Laterality Date   APPENDECTOMY     BREAST LUMPECTOMY W/ NEEDLE LOCALIZATION Left 10/05/2010   lumpectomy   BREAST SURGERY     CORONARY STENT INTERVENTION N/A 03/07/2018   Procedure: CORONARY STENT INTERVENTION;  Surgeon: Troy Sine, MD;  Location: [place] [facility];  Service: Cardiovascular;  Laterality: N/A;   LEFT HEART CATH AND CORONARY ANGIOGRAPHY N/A 03/07/2018   Procedure: LEFT HEART CATH AND CORONARY ANGIOGRAPHY;  Surgeon: Troy Sine, MD;  Location: [place] [facility];  Service: Cardiovascular;  Laterality: N/A;   MASTECTOMY Right 01/26/2011   MASTECTOMY COMPLETE / SIMPLE W/ SENTINEL NODE BIOPSY Left 01/26/2011   OVARIAN CYST SURGERY  1997, 2001, 2008    Social History   Tobacco Use   Smoking status: Never   Smokeless tobacco: Never  Vaping Use   Vaping Use: Never used  Substance Use Topics   Alcohol use: Yes    Comment: 03/07/2018 "probably once/month"   Drug use: No    Family History  Problem Relation Age of Onset   Hypertension Mother    Heart Problems Mother     Hyperlipidemia Mother    Hypotension Father    Other Father        pacemaker   Heart failure Paternal Grandmother    Heart failure Paternal Grandfather    Cancer Brother    Cancer Sister    Diabetes Sister     Allergies  Allergen Reactions   Other     Grapefruit   Elavil [Amitriptyline Hcl] Rash   Elemental Sulfur Rash    Medication list has been reviewed and updated.  Current Outpatient Medications on File Prior to Visit  Medication Sig Dispense Refill   albuterol (VENTOLIN HFA) 108 (90 Base) MCG/ACT inhaler Inhale 1-2 puffs into the lungs every 4 (four) hours as needed for wheezing or shortness of breath. 18 g 6   alendronate (FOSAMAX) 70 MG tablet      amLODipine (NORVASC) 5 MG tablet Take 1 tablet (5 mg total) by mouth daily. 180 tablet 1   aspirin EC 81 MG tablet Take 1 tablet (81 mg total) by mouth daily. 90 tablet 3   atorvastatin (LIPITOR) 80 MG tablet Take 0.5 tablets (40 mg total) by mouth daily at 6 PM. 45 tablet 3   cetirizine (ZYRTEC) 10 MG tablet Take 10 mg by mouth daily as needed for allergies.      clopidogrel (PLAVIX) 75 MG tablet Take 1 tablet (75 mg total) by mouth daily. 90 tablet 3   FLUoxetine (PROZAC) 20 MG capsule Take 1 capsule (20 mg total) by mouth daily. 90 capsule 1   fluticasone (FLONASE) 50 MCG/ACT nasal spray USE 2 SPRAYS IN EACH NOSTRIL EVERY DAY AS NEEDED 48 g 1   metoprolol succinate (TOPROL-XL) 25 MG 24 hr tablet TAKE 1/2 TABLET EVERY DAY 45 tablet 2   montelukast (SINGULAIR) 10 MG tablet Take 1 tablet (10 mg total) by mouth at bedtime. 30 tablet 5   nitroGLYCERIN (NITROSTAT) 0.4 MG SL tablet Place 1 tablet (0.4 mg total) under the tongue every 5 (five) minutes as needed for chest pain. 25 tablet 2   pantoprazole (PROTONIX) 40 MG tablet Take 1 tablet (40 mg total) by mouth daily. 90 tablet 3   traZODone (DESYREL) 50 MG tablet Take 0.5 tablets (25 mg total) by mouth at bedtime as needed for sleep. 45 tablet 1   No current facility-administered  medications on file prior to visit.    Review of Systems:  As per HPI- otherwise negative.   Physical Examination: Vitals:   11/15/22 1338  BP: 122/60  Pulse: (!) 55  Resp: 18  Temp: 97.8 F (36.6 C)  SpO2: 98%   Vitals:   11/15/22 1338  Weight: 145 lb (65.8 kg)  Height: 5\' 3"  (1.6 m)  Body mass index is 25.69 kg/m. Ideal Body Weight: Weight in (lb) to have BMI = 25: 140.8  GEN: no acute distress. Minimal OW, looks well  HEENT: Atraumatic, Normocephalic.  Bilateral TM wnl, oropharynx normal.  PEERL,EOMI.   Ears and Nose: No external deformity. CV: RRR, No M/G/R. No JVD. No thrill. No extra heart sounds. PULM: CTA B, no wheezes, crackles, rhonchi. No retractions. No resp. distress. No accessory muscle use. ABD: S, NT, ND, +BS. No rebound. No HSM. EXTR: No c/c/e PSYCH: Normally interactive. Conversant.  Results for orders placed or performed in visit on 11/15/22  POC COVID-19 BinaxNow  Result Value Ref Range   SARS Coronavirus 2 Ag Negative Negative     Assessment and Plan: Acute cough - Plan: POC COVID-19 BinaxNow, HYDROcodone bit-homatropine (HYCODAN) 5-1.5 MG/5ML syrup  Of upper respiratory infection symptoms for 1 week.  Fever other major systems she request medication for cough which is her main bothersome symptom.  Prescribed Hycodan syrup, cautioned regarding sedation. I asked her to let me know if not feeling better in the next few days and she states agreement  Signed Lamar Blinks, MD

## 2022-11-15 ENCOUNTER — Ambulatory Visit (INDEPENDENT_AMBULATORY_CARE_PROVIDER_SITE_OTHER): Payer: Medicare HMO | Admitting: Family Medicine

## 2022-11-15 VITALS — BP 122/60 | HR 55 | Temp 97.8°F | Resp 18 | Ht 63.0 in | Wt 145.0 lb

## 2022-11-15 DIAGNOSIS — R051 Acute cough: Secondary | ICD-10-CM | POA: Diagnosis not present

## 2022-11-15 LAB — POC COVID19 BINAXNOW: SARS Coronavirus 2 Ag: NEGATIVE

## 2022-11-15 MED ORDER — HYDROCODONE BIT-HOMATROP MBR 5-1.5 MG/5ML PO SOLN: 5.0000 mL | Freq: Three times a day (TID) | ORAL | 0 refills | Status: AC | PRN

## 2022-11-15 NOTE — Patient Instructions (Addendum)
It was good to see you today- I hope you are feeling better soon   I sent in cough syrup for you- it can make you drowsy, do not use when you need to drive Please let me know if you are not improving in the next few days

## 2022-11-26 DIAGNOSIS — Z01419 Encounter for gynecological examination (general) (routine) without abnormal findings: Secondary | ICD-10-CM | POA: Diagnosis not present

## 2022-11-26 DIAGNOSIS — Z6826 Body mass index (BMI) 26.0-26.9, adult: Secondary | ICD-10-CM | POA: Diagnosis not present

## 2022-12-18 ENCOUNTER — Other Ambulatory Visit: Payer: Self-pay | Admitting: Family Medicine

## 2022-12-18 DIAGNOSIS — G4700 Insomnia, unspecified: Secondary | ICD-10-CM

## 2023-01-08 ENCOUNTER — Other Ambulatory Visit: Payer: Self-pay | Admitting: Family Medicine

## 2023-01-08 ENCOUNTER — Other Ambulatory Visit: Payer: Self-pay | Admitting: Cardiovascular Disease

## 2023-01-08 DIAGNOSIS — I10 Essential (primary) hypertension: Secondary | ICD-10-CM

## 2023-01-08 DIAGNOSIS — I25118 Atherosclerotic heart disease of native coronary artery with other forms of angina pectoris: Secondary | ICD-10-CM

## 2023-01-08 DIAGNOSIS — F4323 Adjustment disorder with mixed anxiety and depressed mood: Secondary | ICD-10-CM

## 2023-01-08 DIAGNOSIS — G4700 Insomnia, unspecified: Secondary | ICD-10-CM

## 2023-01-08 DIAGNOSIS — R002 Palpitations: Secondary | ICD-10-CM

## 2023-01-11 DIAGNOSIS — M8588 Other specified disorders of bone density and structure, other site: Secondary | ICD-10-CM | POA: Diagnosis not present

## 2023-01-12 ENCOUNTER — Encounter: Payer: Self-pay | Admitting: Family Medicine

## 2023-01-27 DIAGNOSIS — M8580 Other specified disorders of bone density and structure, unspecified site: Secondary | ICD-10-CM | POA: Diagnosis not present

## 2023-01-28 LAB — TSH: TSH: 1.07 (ref 0.41–5.90)

## 2023-01-28 LAB — VITAMIN D 25 HYDROXY (VIT D DEFICIENCY, FRACTURES): Vit D, 25-Hydroxy: 57

## 2023-01-29 ENCOUNTER — Other Ambulatory Visit: Payer: Self-pay | Admitting: Family Medicine

## 2023-01-29 DIAGNOSIS — F4323 Adjustment disorder with mixed anxiety and depressed mood: Secondary | ICD-10-CM

## 2023-02-01 ENCOUNTER — Other Ambulatory Visit: Payer: Self-pay | Admitting: Family Medicine

## 2023-02-01 DIAGNOSIS — F4323 Adjustment disorder with mixed anxiety and depressed mood: Secondary | ICD-10-CM

## 2023-02-09 ENCOUNTER — Encounter: Payer: Self-pay | Admitting: Family Medicine

## 2023-02-09 DIAGNOSIS — F4323 Adjustment disorder with mixed anxiety and depressed mood: Secondary | ICD-10-CM

## 2023-02-09 MED ORDER — FLUOXETINE HCL 20 MG PO CAPS
20.0000 mg | ORAL_CAPSULE | Freq: Every day | ORAL | 0 refills | Status: DC
Start: 2023-02-09 — End: 2023-03-09

## 2023-03-04 NOTE — Patient Instructions (Incomplete)
Good to see you again today  

## 2023-03-04 NOTE — Progress Notes (Unsigned)
[facility] at [facility] [address] [place], [place] [address] [phone]  Date:  03/09/2023   Name:  [name]   DOB:  1951/07/19   MRN:  [id number]  PCP:  Pearline Cables, MD    Chief Complaint: No chief complaint on file.   History of Present Illness:  [name] is a 72 y.o. very pleasant female patient who presents with the following:  Pt seen today for med refills Last seen by myself in April - history of CAD s/p PCI, hyperlipidemia, angina, breast cancer, osteoporosis  Fosamax Asa 81 Amlodipine Atorvastatin Plavix Prozac  Toprol xl Protonix Trazodone  Shingrix Colon UTD Mammo- s/p B mastectomy Dexa- 2018- ?she also does see GYN     Patient Active Problem List   Diagnosis Date Noted   Fatigue associated with anemia 06/13/2018   Depression 06/13/2018   RLS (restless legs syndrome) 04/05/2018   Atypical chest pain 03/10/2018   Elevated blood pressure reading 03/10/2018   Palpitations 03/10/2018   Chest pain 03/09/2018   Hyperlipidemia 03/08/2018   Coronary artery disease involving native coronary artery of native heart with unstable angina pectoris ([facility]) 03/07/2018   Unstable angina (HCC)    Cough 01/02/2013   Anxiety state 01/02/2013   Breast cancer (DCIS), stage 0, Left, receptor +, dx 2012 09/08/2010    Past Medical History:  Diagnosis Date   Allergy    "dust mite residue, mold, mildew, grapefruit, chili peppers, cat dander, some weeds" (03/07/2018)   Anxiety    Arthritis    "minor; right knee, left hip" (03/07/2018)   Bruises easily    Cancer of left breast ([facility])    double mastectomy 01/26/2011   Childhood asthma    Contact lens/glasses fitting    Coronary artery disease    a.  She underwent LHC 7/23 which revealed 95% stenosis of proximal LAD, managed with PCI/DES.    Depression    Essential hypertension    GERD (gastroesophageal reflux disease)    "at times" (03/07/2018)    Heart murmur    Hiatal hernia    High cholesterol    History of kidney stones    "lots; no OR" (03/07/2018)   Osteoporosis    Rectal bleeding    hemorroid   Ringing in ears, bilateral    "since ~ 06/2017" (03/07/2018)   Sinus drainage     Past Surgical History:  Procedure Laterality Date   APPENDECTOMY     BREAST LUMPECTOMY W/ NEEDLE LOCALIZATION Left 10/05/2010   lumpectomy   BREAST SURGERY     CORONARY STENT INTERVENTION N/A 03/07/2018   Procedure: CORONARY STENT INTERVENTION;  Surgeon: Lennette Bihari, MD;  Location: [facility];  Service: Cardiovascular;  Laterality: N/A;   LEFT HEART CATH AND CORONARY ANGIOGRAPHY N/A 03/07/2018   Procedure: LEFT HEART CATH AND CORONARY ANGIOGRAPHY;  Surgeon: Lennette Bihari, MD;  Location: [facility];  Service: Cardiovascular;  Laterality: N/A;   MASTECTOMY Right 01/26/2011   MASTECTOMY COMPLETE / SIMPLE W/ SENTINEL NODE BIOPSY Left 01/26/2011   OVARIAN CYST SURGERY  1997, 2001, 2008    Social History   Tobacco Use   Smoking status: Never   Smokeless tobacco: Never  Vaping Use   Vaping status: Never Used  Substance Use Topics   Alcohol use: Yes    Comment: 03/07/2018 "probably once/month"   Drug use: No    Family History  Problem Relation  Age of Onset   Hypertension Mother    Heart Problems Mother    Hyperlipidemia Mother    Hypotension Father    Other Father        pacemaker   Heart failure Paternal Grandmother    Heart failure Paternal Grandfather    Cancer Brother    Cancer Sister    Diabetes Sister     Allergies  Allergen Reactions   Other     Grapefruit   Elavil [Amitriptyline Hcl] Rash   Elemental Sulfur Rash    Medication list has been reviewed and updated.  Current Outpatient Medications on File Prior to Visit  Medication Sig Dispense Refill   albuterol (VENTOLIN HFA) 108 (90 Base) MCG/ACT inhaler Inhale 1-2 puffs into the lungs every 4 (four) hours as needed for wheezing or shortness of  breath. 18 g 6   alendronate (FOSAMAX) 70 MG tablet      amLODipine (NORVASC) 5 MG tablet TAKE 1 TABLET DAILY 90 tablet 0   aspirin EC 81 MG tablet Take 1 tablet (81 mg total) by mouth daily. 90 tablet 3   atorvastatin (LIPITOR) 80 MG tablet Take 0.5 tablets (40 mg total) by mouth daily at 6 PM. 45 tablet 3   cetirizine (ZYRTEC) 10 MG tablet Take 10 mg by mouth daily as needed for allergies.      clopidogrel (PLAVIX) 75 MG tablet Take 1 tablet (75 mg total) by mouth daily. 90 tablet 3   FLUoxetine (PROZAC) 20 MG capsule Take 1 capsule (20 mg total) by mouth daily. Needs appt 30 capsule 0   fluticasone (FLONASE) 50 MCG/ACT nasal spray USE 2 SPRAYS IN EACH NOSTRIL EVERY DAY AS NEEDED 48 g 1   metoprolol succinate (TOPROL-XL) 25 MG 24 hr tablet TAKE 1/2 TABLET DAILY 45 tablet 0   montelukast (SINGULAIR) 10 MG tablet Take 1 tablet (10 mg total) by mouth at bedtime. 30 tablet 5   nitroGLYCERIN (NITROSTAT) 0.4 MG SL tablet Place 1 tablet (0.4 mg total) under the tongue every 5 (five) minutes as needed for chest pain. 25 tablet 2   pantoprazole (PROTONIX) 40 MG tablet Take 1 tablet (40 mg total) by mouth daily. 90 tablet 3   traZODone (DESYREL) 50 MG tablet Take 0.5 tablets (25 mg total) by mouth at bedtime as needed for sleep. Needs appt 15 tablet 0   No current facility-administered medications on file prior to visit.    Review of Systems:  As per HPI- otherwise negative.   Physical Examination: There were no vitals filed for this visit. There were no vitals filed for this visit. There is no height or weight on file to calculate BMI. Ideal Body Weight:    GEN: no acute distress. HEENT: Atraumatic, Normocephalic.  Ears and Nose: No external deformity. CV: RRR, No M/G/R. No JVD. No thrill. No extra heart sounds. PULM: CTA B, no wheezes, crackles, rhonchi. No retractions. No resp. distress. No accessory muscle use. ABD: S, NT, ND, +BS. No rebound. No HSM. EXTR: No c/c/e PSYCH: Normally  interactive. Conversant.    Assessment and Plan: ***  Signed Abbe Amsterdam, MD

## 2023-03-07 ENCOUNTER — Other Ambulatory Visit: Payer: Self-pay | Admitting: Family Medicine

## 2023-03-07 DIAGNOSIS — F4323 Adjustment disorder with mixed anxiety and depressed mood: Secondary | ICD-10-CM

## 2023-03-09 ENCOUNTER — Ambulatory Visit (INDEPENDENT_AMBULATORY_CARE_PROVIDER_SITE_OTHER): Payer: Medicare HMO | Admitting: Family Medicine

## 2023-03-09 ENCOUNTER — Encounter: Payer: Self-pay | Admitting: Family Medicine

## 2023-03-09 VITALS — BP 120/80 | HR 62 | Temp 97.6°F | Resp 18 | Ht 63.0 in | Wt 149.4 lb

## 2023-03-09 DIAGNOSIS — M25551 Pain in right hip: Secondary | ICD-10-CM | POA: Diagnosis not present

## 2023-03-09 DIAGNOSIS — Z1329 Encounter for screening for other suspected endocrine disorder: Secondary | ICD-10-CM | POA: Diagnosis not present

## 2023-03-09 DIAGNOSIS — R7303 Prediabetes: Secondary | ICD-10-CM | POA: Insufficient documentation

## 2023-03-09 DIAGNOSIS — R5383 Other fatigue: Secondary | ICD-10-CM | POA: Diagnosis not present

## 2023-03-09 DIAGNOSIS — J309 Allergic rhinitis, unspecified: Secondary | ICD-10-CM

## 2023-03-09 DIAGNOSIS — M25552 Pain in left hip: Secondary | ICD-10-CM

## 2023-03-09 DIAGNOSIS — Z13220 Encounter for screening for lipoid disorders: Secondary | ICD-10-CM

## 2023-03-09 DIAGNOSIS — Z131 Encounter for screening for diabetes mellitus: Secondary | ICD-10-CM

## 2023-03-09 DIAGNOSIS — Z0000 Encounter for general adult medical examination without abnormal findings: Secondary | ICD-10-CM

## 2023-03-09 DIAGNOSIS — G4700 Insomnia, unspecified: Secondary | ICD-10-CM | POA: Diagnosis not present

## 2023-03-09 DIAGNOSIS — Z130 Encounter for screening for diseases of the blood and blood-forming organs and certain disorders involving the immune mechanism: Secondary | ICD-10-CM

## 2023-03-09 DIAGNOSIS — F4323 Adjustment disorder with mixed anxiety and depressed mood: Secondary | ICD-10-CM

## 2023-03-09 DIAGNOSIS — Z1159 Encounter for screening for other viral diseases: Secondary | ICD-10-CM

## 2023-03-09 LAB — COMPREHENSIVE METABOLIC PANEL
ALT: 41 U/L — ABNORMAL HIGH (ref 0–35)
AST: 36 U/L (ref 0–37)
Albumin: 4.4 g/dL (ref 3.5–5.2)
Alkaline Phosphatase: 98 U/L (ref 39–117)
BUN: 14 mg/dL (ref 6–23)
CO2: 28 mEq/L (ref 19–32)
Calcium: 9.2 mg/dL (ref 8.4–10.5)
Chloride: 103 mEq/L (ref 96–112)
Creatinine, Ser: 0.77 mg/dL (ref 0.40–1.20)
GFR: 77.51 mL/min (ref >60.00)
Glucose, Bld: 82 mg/dL (ref 70–99)
Potassium: 4.3 mEq/L (ref 3.5–5.1)
Sodium: 140 mEq/L (ref 135–145)
Total Bilirubin: 2.2 mg/dL — ABNORMAL HIGH (ref 0.2–1.2)
Total Protein: 6.6 g/dL (ref 6.0–8.3)

## 2023-03-09 LAB — CBC
HCT: 41.1 % (ref 36.0–46.0)
Hemoglobin: 13.5 g/dL (ref 12.0–15.0)
MCHC: 33 g/dL (ref 30.0–36.0)
MCV: 97.1 fl (ref 78.0–100.0)
Platelets: 212 10*3/uL (ref 150.0–400.0)
RBC: 4.23 Mil/uL (ref 3.87–5.11)
RDW: 13.3 % (ref 11.5–15.5)
WBC: 5.9 10*3/uL (ref 4.0–10.5)

## 2023-03-09 LAB — LIPID PANEL
Cholesterol: 137 mg/dL (ref 0–200)
HDL: 44 mg/dL (ref >39.00)
LDL Cholesterol: 59 mg/dL (ref 0–99)
NonHDL: 92.63
Total CHOL/HDL Ratio: 3
Triglycerides: 167 mg/dL — ABNORMAL HIGH (ref 0.0–149.0)
VLDL: 33.4 mg/dL (ref 0.0–40.0)

## 2023-03-09 LAB — HEMOGLOBIN A1C: Hgb A1c MFr Bld: 6 % (ref 4.6–6.5)

## 2023-03-09 MED ORDER — MONTELUKAST SODIUM 10 MG PO TABS
10.0000 mg | ORAL_TABLET | Freq: Every day | ORAL | 3 refills | Status: AC
Start: 2023-03-09 — End: ?

## 2023-03-09 MED ORDER — TRAZODONE HCL 50 MG PO TABS
25.0000 mg | ORAL_TABLET | Freq: Every evening | ORAL | 3 refills | Status: DC | PRN
Start: 2023-03-09 — End: 2023-07-17

## 2023-03-09 MED ORDER — FLUOXETINE HCL 20 MG PO CAPS: 20.0000 mg | ORAL_CAPSULE | Freq: Every day | ORAL | 3 refills | Status: DC

## 2023-03-09 NOTE — Therapy (Signed)
OUTPATIENT PHYSICAL THERAPY THORACOLUMBAR & LE EVALUATION   Patient Name: [name] MRN: [id number] DOB:03-27-1951, 72 y.o., female Today's Date: 03/22/2023  END OF SESSION:  PT End of Session - 03/22/23 1620     Visit Number 1    Date for PT Re-Evaluation 05/03/23    Authorization Type [employer]    Progress Note Due on Visit 10    PT Start Time 1620   Pt arrived late   PT Stop Time 1725    PT Time Calculation (min) 65 min    Activity Tolerance Patient tolerated treatment well;Patient limited by pain    Behavior During Therapy [facility] for tasks assessed/performed             Past Medical History:  Diagnosis Date   Allergy    "dust mite residue, mold, mildew, grapefruit, chili peppers, cat dander, some weeds" (03/07/2018)   Anxiety    Arthritis    "minor; right knee, left hip" (03/07/2018)   Bruises easily    Cancer of left breast (HCC)    double mastectomy 01/26/2011   Childhood asthma    Contact lens/glasses fitting    Coronary artery disease    a.  She underwent LHC 7/23 which revealed 95% stenosis of proximal LAD, managed with PCI/DES.    Depression    Essential hypertension    GERD (gastroesophageal reflux disease)    "at times" (03/07/2018)   Heart murmur    Hiatal hernia    High cholesterol    History of kidney stones    "lots; no OR" (03/07/2018)   Osteoporosis    Rectal bleeding    hemorroid   Ringing in ears, bilateral    "since ~ 06/2017" (03/07/2018)   Sinus drainage    Past Surgical History:  Procedure Laterality Date   APPENDECTOMY     BREAST LUMPECTOMY W/ NEEDLE LOCALIZATION Left 10/05/2010   lumpectomy   BREAST SURGERY     CORONARY STENT INTERVENTION N/A 03/07/2018   Procedure: CORONARY STENT INTERVENTION;  Surgeon: Lennette Bihari, MD;  Location: [facility];  Service: Cardiovascular;  Laterality: N/A;   LEFT HEART CATH AND CORONARY ANGIOGRAPHY N/A 03/07/2018   Procedure: LEFT HEART CATH AND CORONARY ANGIOGRAPHY;  Surgeon: Lennette Bihari, MD;  Location: [facility];  Service: Cardiovascular;  Laterality: N/A;   MASTECTOMY Right 01/26/2011   MASTECTOMY COMPLETE / SIMPLE W/ SENTINEL NODE BIOPSY Left 01/26/2011   OVARIAN CYST SURGERY  1997, 2001, 2008   Patient Active Problem List   Diagnosis Date Noted   Prediabetes 03/09/2023   Fatigue associated with anemia 06/13/2018   Depression 06/13/2018   RLS (restless legs syndrome) 04/05/2018   Atypical chest pain 03/10/2018   Elevated blood pressure reading 03/10/2018   Palpitations 03/10/2018   Chest pain 03/09/2018   Hyperlipidemia 03/08/2018   Coronary artery disease involving native coronary artery of native heart with unstable angina pectoris (HCC) 03/07/2018   Unstable angina (HCC)    Cough 01/02/2013   Anxiety state 01/02/2013   Breast cancer (DCIS), stage 0, Left, receptor +, dx 2012 09/08/2010    PCP: Pearline Cables, MD   REFERRING PROVIDER: Pearline Cables, MD   REFERRING DIAG: [phone] (ICD-10-CM) - Bilateral hip pain  (Lower back and hip pain)  THERAPY DIAG:  Pain of both hip joints  Other low back pain  Radiculopathy, lumbar region  Other muscle spasm  Muscle weakness (generalized)  RATIONALE FOR EVALUATION AND TREATMENT: Rehabilitation  ONSET  DATE: >1 yr; exacerbation ~2 weeks ago  NEXT MD VISIT: none scheduled - PRN   SUBJECTIVE:                                                                                                                                                                                                         SUBJECTIVE STATEMENT: Pt reports the majority of her problem is in her L hip - okay when sitting or laying down, but when she goes to get up she notes tremendous pressure in L hip and leg. She reports a h/o a fall ~1 yr ago. She has experienced instances where her back locks up - recent episode after going to get up after being on hands and knees painting baseboards ~2 weeks ago. She does note  h/o "tailbone" pain. She uses ice for her back. She also notes cracking in her neck as well as tender places t/o her body. Avoids pain meds because they make her constipated.  She notes she has had more sciatic pain in the last moth than before.  PAIN: Are you having pain? Yes: NPRS scale: 4/10 Pain location: R lateral hip Pain description: tender to touch Aggravating factors: moving after prolonged stationary activity Relieving factors: ice, Tylenol  PERTINENT HISTORY:  OA, osteoporosis, HTN, CAD, breast cancer s/p B mastectomies, GERD, anxiety, depression, RLS (affects R side more than L)  PRECAUTIONS: None  RED FLAGS: None  WEIGHT BEARING RESTRICTIONS: No  FALLS:  Has patient fallen in last 6 months? Yes. Number of falls ~1  LIVING ENVIRONMENT: Lives with: lives with their spouse Lives in: House/apartment Stairs: Yes: Internal: 15 steps; on left going up and External: 1 steps; none Has following equipment at home: Single point cane  OCCUPATION: Retired  PLOF: Independent and Leisure: sewing, crafts, Art gallery manager, Holiday representative; no regular exercise, but thinking about doing some water exercises at the [facility]  PATIENT GOALS: "Relief from the pain."   OBJECTIVE: (objective measures completed at initial evaluation unless otherwise dated)  DIAGNOSTIC FINDINGS:  B hip & pelvis x-rays ordered but not yet completed as of eval  02/08/22 - Lumbar spine x-ray: IMPRESSION: No acute fracture or dislocation. Mild lower lumbar spine facet joint degenerative joint changes stable.  02/08/22 - B hip x-rays: IMPRESSION: No acute osseous injury of the bilateral hips.  PATIENT SURVEYS:  Modified Oswestry 14 / 50 = 28.0 %  LEFS 54 / 80 = 67.5 %  SCREENING FOR RED FLAGS: Bowel or bladder incontinence: No - but feels like her control is getting weaker Spinal tumors: No  Cauda equina syndrome: No Compression fracture: No Abdominal aneurysm: No  COGNITION:  Overall cognitive  status: Within functional limits for tasks assessed    SENSATION: Intermittent L>R sciatic pain, numbness & tingling into feet  MUSCLE LENGTH: Hamstrings: mild tight B ITB: mod tight B Piriformis: mod/severe tight B Hip flexors: mild tight B Quads: mild tight B Heelcord: NT  POSTURE:  No Significant postural limitations  PALPATION: Increased muscle tension and tenderness to palpation in bilateral upper and lateral glutes and piriformis  LUMBAR ROM:   Active  A/PROM  eval  Flexion Fingertips to toes - HS tightness  Extension WFL - abdominal and hip flexor tightness  Right lateral flexion WFL - QL tightness  Left lateral flexion WFL - QL tightness  Right rotation WFL  Left rotation WFL  (Blank rows = not tested)  LOWER EXTREMITY ROM:    B LE WFL other than restrictions in B hip IR/ER due to the muscle tightness as above  LOWER EXTREMITY MMT:    MMT Right eval Left eval  Hip flexion 4- 4-  Hip extension 3+ 4  Hip abduction 4- 4+  Hip adduction 4 4+  Hip internal rotation 3+ * 4- *  Hip external rotation 4- 4-  Knee flexion 4 4  Knee extension 4+ 4+  Ankle dorsiflexion 4 4  Ankle plantarflexion 4 (15 SLS HR) 4 (12 SLS HR)  Ankle inversion    Ankle eversion     (Blank rows = not tested; * = pain)  LUMBAR SPECIAL TESTS:  Straight leg raise test: Negative and FABER test: Positive  LOWER EXTREMITY SPECIAL TESTS:  Hip special tests: Luisa Hart (FABER) test: positive  and Ober's test: positive     TODAY'S TREATMENT:   03/22/23 THERAPEUTIC EXERCISE: to improve flexibility, strength and mobility.  Demonstration, verbal and tactile cues throughout for technique.  SKTC 2 x 30" - 1st rep with opposite knee bent, 2nd rep with opposite knee straight - no preference noted Hooklying LTR 2 x 10" Hooklying KTOS piriformis stretch x 30" B Hooklying figure-4 piriformis stretch x 30" B - deferred d/t increased pain Seated hip hinge figure-4 piriformis stretch x 30" B -  deferred d/t increased pain Side-sitting hip hinge figure-4 piriformis stretch x 30" B - better tolerated but still uncomfortable - pt cautioned to avoid pushing too hard into stretch   Patient Education - Trigger Point Dry Needling   PATIENT EDUCATION:  Education details: PT eval findings, anticipated POC, and initial HEP Person educated: Patient Education method: Programmer, multimedia, Demonstration, Verbal cues, and Handouts Education comprehension: verbalized understanding, returned demonstration, verbal cues required, and needs further education  HOME EXERCISE PROGRAM: Access Code: [email or web address] URL: [email or web address] Date: 03/22/2023 Prepared by: Glenetta Hew  Exercises - Single Knee to Chest Stretch  - 2 x daily - 7 x weekly - 3 reps - 30 sec hold - Supine Lower Trunk Rotation  - 2 x daily - 7 x weekly - 5 reps - 10 sec hold - Supine Piriformis Stretch with Foot on Ground  - 2 x daily - 7 x weekly - 3 reps - 30 sec hold - Seated Table Piriformis Stretch  - 2 x daily - 7 x weekly - 3 reps - 30 sec hold  Patient Education - Trigger Point Dry Needling   ASSESSMENT:  CLINICAL IMPRESSION: [name] is a 72 y.o. female who was seen today for physical therapy evaluation and treatment for low back pain and bilateral hip pain.  She reports onset  of pain >1 year ago, with increasing intensity of sciatica over the past month and acute exacerbation of her pain ~2 weeks ago.  Pain typically worse in L hip/buttock, although today's pain is more in the R hip.  LBP often associated with B LE radiculopathy including pain numbness and tingling down B LE to feet.  Pain is intermittent, most prominent upon initiation of movement after periods of inactivity, with recent acute exacerbation of pain occurring after prolonged period of being on hands and knees while painting baseboards.  Lumbar ROM essentially WFL other than increased muscle pulling/tightness noted.  Additional deficits include  increased muscle tension/TTP in B glutes and piriformis, decreased proximal LE flexibility, and B LE weakness.  Pain interferes with activity tolerance, with increased difficulty getting out of bed at night go to bathroom.  Terra will benefit from skilled PT to address above deficits to improve mobility and activity tolerance with decreased pain interference.   OBJECTIVE IMPAIRMENTS: decreased activity tolerance, decreased endurance, decreased knowledge of condition, decreased mobility, difficulty walking, decreased ROM, decreased strength, hypomobility, increased fascial restrictions, impaired perceived functional ability, increased muscle spasms, impaired flexibility, impaired sensation, improper body mechanics, postural dysfunction, and pain.   ACTIVITY LIMITATIONS: carrying, lifting, bending, sitting, standing, squatting, transfers, bed mobility, toileting, locomotion level, and caring for others  PARTICIPATION LIMITATIONS: meal prep, cleaning, laundry, driving, shopping, and yard work  PERSONAL FACTORS: Age, Fitness, Past/current experiences, Time since onset of injury/illness/exacerbation, and 3+ comorbidities: OA, osteoporosis, HTN, CAD, breast cancer s/p B mastectomies, GERD, anxiety, depression, RLS (affects R side more than L)  are also affecting patient's functional outcome.   REHAB POTENTIAL: Good  CLINICAL DECISION MAKING: Stable/uncomplicated  EVALUATION COMPLEXITY: Low   GOALS: Goals reviewed with patient? Yes  SHORT TERM GOALS: Target date: 04/12/2023  Patient will be independent with initial HEP to improve outcomes and carryover.  Baseline: Initial HEP provided on eval Goal status: INITIAL  2.  Patient will report centralization of radicular symptoms.  Baseline: Intermittent B LE radicular pain, numbness and tingling to feet Goal status: INITIAL  LONG TERM GOALS: Target date: 05/03/2023  Patient will be independent with ongoing/advanced HEP for self-management at home.   Baseline:  Goal status: INITIAL  2.  Patient will report 50-75% improvement in low back pain and B hip pain to improve QOL.  Baseline: Intermittent but severe at times Goal status: INITIAL  3.  Patient to demonstrate ability to achieve and maintain good spinal alignment/posturing and body mechanics needed for daily activities. Baseline:  Goal status: INITIAL  4.  Patient will demonstrate full pain free lumbar ROM to perform ADLs.   Baseline: Refer to above lumbar ROM table Goal status: INITIAL  5.  Patient will demonstrate improved B LE strength to >/= 4+/5 for improved stability and ease of mobility . Baseline: Refer to above LE MMT table Goal status: INITIAL  6. Patient will report </= 16% on Modified Oswestry to demonstrate improved functional ability with decreased pain interference. Baseline: 14 / 50 = 28.0 % Goal status: INITIAL  7.  Patient will report >/= 63/80 on LEFS to demonstrate improved functional ability with decreased pain interference. Baseline: 54 / 80 = 67.5 % Goal status: INITIAL   8.  Patient to report ability to perform ADLs, household, and leisure activities without limitation due to low back or B hip pain, LOM or weakness. Baseline:  Goal status: INITIAL   PLAN:  PT FREQUENCY: 2x/week  PT DURATION: 6 weeks  PLANNED INTERVENTIONS: Therapeutic exercises,  Therapeutic activity, Neuromuscular re-education, Balance training, Gait training, Patient/Family education, Self Care, Joint mobilization, Aquatic Therapy, Dry Needling, Electrical stimulation, Spinal manipulation, Spinal mobilization, Cryotherapy, Moist heat, Taping, Traction, Ultrasound, Manual therapy, and Re-evaluation  PLAN FOR NEXT SESSION: Review initial HEP; gently progress lumbopelvic flexibility and strengthening, updating HEP as indicated; MT +/- DN to address abnormal muscle tension in glutes and piriformis   Marry Guan, PT 03/22/2023, 5:28 PM

## 2023-03-10 ENCOUNTER — Encounter: Payer: Self-pay | Admitting: Family Medicine

## 2023-03-10 ENCOUNTER — Other Ambulatory Visit: Payer: Self-pay | Admitting: Family Medicine

## 2023-03-10 DIAGNOSIS — R17 Unspecified jaundice: Secondary | ICD-10-CM

## 2023-03-10 LAB — HEPATITIS C ANTIBODY: Hepatitis C Ab: NONREACTIVE

## 2023-03-15 LAB — HM DEXA SCAN

## 2023-03-22 ENCOUNTER — Other Ambulatory Visit: Payer: Self-pay

## 2023-03-22 ENCOUNTER — Encounter: Payer: Self-pay | Admitting: Physical Therapy

## 2023-03-22 ENCOUNTER — Ambulatory Visit: Payer: Medicare HMO | Attending: Family Medicine | Admitting: Physical Therapy

## 2023-03-22 DIAGNOSIS — M6281 Muscle weakness (generalized): Secondary | ICD-10-CM

## 2023-03-22 DIAGNOSIS — M62838 Other muscle spasm: Secondary | ICD-10-CM | POA: Diagnosis not present

## 2023-03-22 DIAGNOSIS — M5459 Other low back pain: Secondary | ICD-10-CM | POA: Diagnosis not present

## 2023-03-22 DIAGNOSIS — M25552 Pain in left hip: Secondary | ICD-10-CM | POA: Insufficient documentation

## 2023-03-22 DIAGNOSIS — M25551 Pain in right hip: Secondary | ICD-10-CM

## 2023-03-22 DIAGNOSIS — M5416 Radiculopathy, lumbar region: Secondary | ICD-10-CM | POA: Diagnosis not present

## 2023-03-26 ENCOUNTER — Other Ambulatory Visit: Payer: Self-pay | Admitting: Cardiovascular Disease

## 2023-03-26 DIAGNOSIS — E785 Hyperlipidemia, unspecified: Secondary | ICD-10-CM

## 2023-04-01 ENCOUNTER — Encounter: Payer: Self-pay | Admitting: Physical Therapy

## 2023-04-01 ENCOUNTER — Ambulatory Visit: Payer: Medicare HMO | Admitting: Physical Therapy

## 2023-04-01 DIAGNOSIS — M5416 Radiculopathy, lumbar region: Secondary | ICD-10-CM

## 2023-04-01 DIAGNOSIS — M5459 Other low back pain: Secondary | ICD-10-CM | POA: Diagnosis not present

## 2023-04-01 DIAGNOSIS — M62838 Other muscle spasm: Secondary | ICD-10-CM | POA: Diagnosis not present

## 2023-04-01 DIAGNOSIS — M25552 Pain in left hip: Secondary | ICD-10-CM | POA: Diagnosis not present

## 2023-04-01 DIAGNOSIS — M6281 Muscle weakness (generalized): Secondary | ICD-10-CM | POA: Diagnosis not present

## 2023-04-01 DIAGNOSIS — M25551 Pain in right hip: Secondary | ICD-10-CM

## 2023-04-01 NOTE — Therapy (Addendum)
OUTPATIENT PHYSICAL THERAPY TREATMENT   Patient Name: [name] MRN: [id number] DOB:[birth year], 72 y.o., female Today's Date: 04/01/2023  END OF SESSION:  PT End of Session - 04/01/23 0931     Visit Number 2    Date for PT Re-Evaluation 05/03/23    Authorization Type [employer]    Progress Note Due on Visit 10    PT Start Time [phone]    PT Stop Time 1020    PT Time Calculation (min) 49 min    Activity Tolerance Patient tolerated treatment well    Behavior During Therapy [facility] for tasks assessed/performed              Past Medical History:  Diagnosis Date   Allergy    "dust mite residue, mold, mildew, grapefruit, chili peppers, cat dander, some weeds" (03/07/2018)   Anxiety    Arthritis    "minor; right knee, left hip" (03/07/2018)   Bruises easily    Cancer of left breast (HCC)    double mastectomy 01/26/2011   Childhood asthma    Contact lens/glasses fitting    Coronary artery disease    a.  She underwent LHC 7/23 which revealed 95% stenosis of proximal LAD, managed with PCI/DES.    Depression    Essential hypertension    GERD (gastroesophageal reflux disease)    "at times" (03/07/2018)   Heart murmur    Hiatal hernia    High cholesterol    History of kidney stones    "lots; no OR" (03/07/2018)   Osteoporosis    Rectal bleeding    hemorroid   Ringing in ears, bilateral    "since ~ 06/2017" (03/07/2018)   Sinus drainage    Past Surgical History:  Procedure Laterality Date   APPENDECTOMY     BREAST LUMPECTOMY W/ NEEDLE LOCALIZATION Left 10/05/2010   lumpectomy   BREAST SURGERY     CORONARY STENT INTERVENTION N/A 03/07/2018   Procedure: CORONARY STENT INTERVENTION;  Surgeon: Lennette Bihari, MD;  Location: [facility];  Service: Cardiovascular;  Laterality: N/A;   LEFT HEART CATH AND CORONARY ANGIOGRAPHY N/A 03/07/2018   Procedure: LEFT HEART CATH AND CORONARY ANGIOGRAPHY;  Surgeon: Lennette Bihari, MD;  Location: [facility];  Service:  Cardiovascular;  Laterality: N/A;   MASTECTOMY Right 01/26/2011   MASTECTOMY COMPLETE / SIMPLE W/ SENTINEL NODE BIOPSY Left 01/26/2011   OVARIAN CYST SURGERY  1997, 2001, 2008   Patient Active Problem List   Diagnosis Date Noted   Prediabetes 03/09/2023   Fatigue associated with anemia 06/13/2018   Depression 06/13/2018   RLS (restless legs syndrome) 04/05/2018   Atypical chest pain 03/10/2018   Elevated blood pressure reading 03/10/2018   Palpitations 03/10/2018   Chest pain 03/09/2018   Hyperlipidemia 03/08/2018   Coronary artery disease involving native coronary artery of native heart with unstable angina pectoris (HCC) 03/07/2018   Unstable angina (HCC)    Cough 01/02/2013   Anxiety state 01/02/2013   Breast cancer (DCIS), stage 0, Left, receptor +, dx 2012 09/08/2010    PCP: Pearline Cables, MD   REFERRING PROVIDER: Pearline Cables, MD   REFERRING DIAG: [phone] (ICD-10-CM) - Bilateral hip pain  (Lower back and hip pain)  THERAPY DIAG:  Pain of both hip joints  Other low back pain  Radiculopathy, lumbar region  Other muscle spasm  Muscle weakness (generalized)  RATIONALE FOR EVALUATION AND TREATMENT: Rehabilitation  ONSET DATE: >1 yr; exacerbation ~2 weeks ago  NEXT  MD VISIT: none scheduled - PRN   SUBJECTIVE:                                                                                                                                                                                                         SUBJECTIVE STATEMENT: Pt reports pain still most intense with initiation of movement subsiding with repetition. HEP going well but not noting as much stretch on the R. She states she doesn't care for the LTR - doesn't feel like she get much stretch. Typically completing HEP 1-2x/day.  EVAL:  Pt reports the majority of her problem is in her L hip - okay when sitting or laying down, but when she goes to get up she notes tremendous pressure in L  hip and leg. She reports a h/o a fall ~1 yr ago. She has experienced instances where her back locks up - recent episode after going to get up after being on hands and knees painting baseboards ~2 weeks ago. She does note h/o "tailbone" pain. She uses ice for her back. She also notes cracking in her neck as well as tender places t/o her body. Avoids pain meds because they make her constipated.  She notes she has had more sciatic pain in the last moth than before.  PAIN: Are you having pain? Yes: NPRS scale:  7/10 Pain location: L lateral hip & thigh Pain description: tender to touch Aggravating factors: moving after prolonged stationary activity Relieving factors: ice, Tylenol  PERTINENT HISTORY:  OA, osteoporosis, HTN, CAD, breast cancer s/p B mastectomies, GERD, anxiety, depression, RLS (affects R side more than L)  PRECAUTIONS: None  RED FLAGS: None  WEIGHT BEARING RESTRICTIONS: No  FALLS:  Has patient fallen in last 6 months? Yes. 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 Mild lower lumbar spine facet joint degenerative joint changes stable.  02/08/22 - B hip x-rays: IMPRESSION: No acute osseous injury of the bilateral hips.  PATIENT SURVEYS:  Modified Oswestry 14 / 50 = 28.0 %  LEFS 54 / 80 = 67.5 %  SCREENING  FOR RED FLAGS: Bowel or bladder incontinence: No - but feels like her control is getting weaker Spinal tumors: No Cauda equina syndrome: No Compression fracture: No Abdominal aneurysm: No  COGNITION:  Overall cognitive status: Within functional limits for tasks assessed    SENSATION: Intermittent L>R sciatic pain, numbness & tingling into feet  MUSCLE LENGTH: Hamstrings: mild tight B ITB: mod tight B Piriformis: mod/severe tight B Hip flexors: mild tight B Quads: mild tight B Heelcord: NT  POSTURE:  No Significant postural limitations  PALPATION: Increased muscle tension and tenderness to palpation in bilateral upper and lateral glutes and piriformis  LUMBAR ROM:   Active  A/PROM  eval  Flexion Fingertips to toes - HS tightness  Extension WFL - abdominal and hip flexor tightness  Right lateral flexion WFL - QL tightness  Left lateral flexion WFL - QL tightness  Right rotation WFL  Left rotation WFL  (Blank rows = not tested)  LOWER EXTREMITY ROM:    B LE WFL other than restrictions in B hip IR/ER due to the muscle tightness as above  LOWER EXTREMITY MMT:    MMT Right eval Left eval  Hip flexion 4- 4-  Hip extension 3+ 4  Hip abduction 4- 4+  Hip adduction 4 4+  Hip internal rotation 3+ * 4- *  Hip external rotation 4- 4-  Knee flexion 4 4  Knee extension 4+ 4+  Ankle dorsiflexion 4 4  Ankle plantarflexion 4 (15 SLS HR) 4 (12 SLS HR)  Ankle inversion    Ankle eversion     (Blank rows = not tested; * = pain)  LUMBAR SPECIAL TESTS:  Straight leg raise test: Negative and FABER test: Positive  LOWER EXTREMITY SPECIAL TESTS:  Hip special tests: Luisa Hart (FABER) test: positive  and Ober's test: positive    TODAY'S TREATMENT:   04/01/23 THERAPEUTIC EXERCISE: to improve flexibility, strength and mobility.  Demonstration, verbal and tactile cues throughout for technique.  Rec Bike - L2 x 6 min SKTC with opposite knee straight 2 x 30" B - less stretch noted  on R SKTC in mod thomas position x 30" R - better stretch Hooklying KTOS piriformis stretch x 30" B Hooklying figure-4 piriformis stretch x 30" B - better tolerated today Hooklying LTR 2 x 10" - not getting much stretch S/L open book stretch 5 x 5" - too intense at shoulder and still minimal thoracolumbar stretch Hooklying ITB/glute stretch with lateral pull crossing one LE over opp knee x 30" - minimal stretch noted Supine cross-body ITB stretch with strap 2 x 30" B - better stretch but felt mostly in glutes Side-sitting hip hinge figure-4 piriformis stretch x 30" B    MANUAL THERAPY: To promote normalized muscle tension, improved flexibility, and reduced pain. Skilled palpation and monitoring of soft tissue during DN Trigger Point Dry-Needling  Treatment instructions: Expect mild to moderate muscle soreness. Patient verbalized understanding of these instructions and education. Patient Consent Given: Yes Education handout provided: Yes Muscles treated: B glute max/med/min and piriformis Electrical stimulation performed: No Parameters: N/A Treatment response/outcome: Twitch Response Elicited and Palpable Increase in Muscle Length STM/DTM, manual TPR and pin & stretch to muscles addressed with DN    03/22/23 THERAPEUTIC EXERCISE: to improve flexibility, strength and mobility.  Demonstration, verbal and tactile cues throughout for technique.  SKTC 2 x 30" - 1st rep with  opposite knee bent, 2nd rep with opposite knee straight - no preference noted Hooklying LTR 2 x 10" Hooklying KTOS piriformis stretch x 30" B Hooklying figure-4 piriformis stretch x 30" B - deferred d/t increased pain Seated hip hinge figure-4 piriformis stretch x 30" B - deferred d/t increased pain Side-sitting hip hinge figure-4 piriformis stretch x 30" B - better tolerated but still uncomfortable - pt cautioned to avoid pushing too hard into stretch   Patient Education - Trigger Point Dry Needling   PATIENT  EDUCATION:  Education details: role of DN and DN rational, procedure, outcomes, potential side effects, and recommended post-treatment exercises/activity Person educated: Patient Education method: Explanation and Handouts Education comprehension: verbalized understanding  HOME EXERCISE PROGRAM: Access Code: [email or web address] URL: [email or web address] Date: 04/01/2023 Prepared by: Glenetta Hew  Exercises - Single Knee to Chest Stretch  - 2 x daily - 7 x weekly - 3 reps - 30 sec hold - Supine Piriformis Stretch with Foot on Ground  - 2 x daily - 7 x weekly - 3 reps - 30 sec hold - Seated Table Piriformis Stretch  - 2 x daily - 7 x weekly - 3 reps - 30 sec hold - Supine Iliotibial Band Stretch with Strap  - 2 x daily - 7 x weekly - 3 reps - 30 sec hold  Patient Education - Trigger Point Dry Needling   ASSESSMENT:  CLINICAL IMPRESSION: Vella reports HEP going well, but does not feel that she gets much of a stretch with the LTR.  Attempted open book stretch as an alternative trunk rotation stretch but too intense at shoulder secondary s/p mastectomy.  Remainder of HEP reviewed with modifications and adjustments made to help improve stretch intensity.  Able to achieve better [facility] stretch on R with L LE and mod Thomas position and better glute stretch with supine ITB stretch with strap, therefore replaced LTR with this in HEP.  She continues to demonstrate significant point tenderness and increased muscle tension/TPs in B glutes and piriformis which appeared amenable to DN.  After explanation of DN rational, procedures, outcomes and potential side effects, patient verbalized consent to DN treatment in conjunction with manual STM/DTM and TPR to reduce ttp/muscle tension.  Muscles treated as indicated above.  DN produced normal response with good twitches elicited resulting in palpable reduction in pain/ttp and muscle tension.  Pt educated to expect mild to moderate muscle soreness for up to  24-48 hrs and instructed to continue prescribed HEP and current activity level with pt verbalizing understanding of these instructions.  Although not attempted as exercises, patient report increased pain in her glutes along with increased radicular pain with bridges and lumbar extension in prone while trying to get comfortable during DN and when trying get up from treatment table, therefore we will plan for exercise progression with flexion based preference as tolerated.  Tanee will benefit from skilled PT to address above deficits to improve mobility and activity tolerance with decreased pain interference.   OBJECTIVE IMPAIRMENTS: decreased activity tolerance, decreased endurance, decreased knowledge of condition, decreased mobility, difficulty walking, decreased ROM, decreased strength, hypomobility, increased fascial restrictions, impaired perceived functional ability, increased muscle spasms, impaired flexibility, impaired sensation, improper body mechanics, postural dysfunction, and pain.   ACTIVITY LIMITATIONS: carrying, lifting, bending, sitting, standing, squatting, transfers, bed mobility, toileting, locomotion level, and caring for others  PARTICIPATION LIMITATIONS: meal prep, cleaning, laundry, driving, shopping, and yard work  PERSONAL FACTORS: Age, Fitness, Past/current experiences, Time since onset of injury/illness/exacerbation, and 3+ comorbidities: OA,  osteoporosis, HTN, CAD, breast cancer s/p B mastectomies, GERD, anxiety, depression, RLS (affects R side more than L)  are also affecting patient's functional outcome.   REHAB POTENTIAL: Good  CLINICAL DECISION MAKING: Stable/uncomplicated  EVALUATION COMPLEXITY: Low   GOALS: Goals reviewed with patient? Yes  SHORT TERM GOALS: Target date: 04/12/2023  Patient will be independent with initial HEP to improve outcomes and carryover.  Baseline: Initial HEP provided on eval Goal status: IN PROGRESS  04/01/23 - HEP reviewed with  modifications made  2.  Patient will report centralization of radicular symptoms.  Baseline: Intermittent B LE radicular pain, numbness and tingling to feet Goal status: IN PROGRESS  LONG TERM GOALS: Target date: 05/03/2023  Patient will be independent with ongoing/advanced HEP for self-management at home.  Baseline:  Goal status: IN PROGRESS  2.  Patient will report 50-75% improvement in low back pain and B hip pain to improve QOL.  Baseline: Intermittent but severe at times Goal status: IN PROGRESS  3.  Patient to demonstrate ability to achieve and maintain good spinal alignment/posturing and body mechanics needed for daily activities. Baseline:  Goal status: IN PROGRESS  4.  Patient will demonstrate full pain free lumbar ROM to perform ADLs.   Baseline: Refer to above lumbar ROM table Goal status: IN PROGRESS  5.  Patient will demonstrate improved B LE strength to >/= 4+/5 for improved stability and ease of mobility . Baseline: Refer to above LE MMT table Goal status: IN PROGRESS  6. Patient will report </= 16% on Modified Oswestry to demonstrate improved functional ability with decreased pain interference. Baseline: 14 / 50 = 28.0 % Goal status: IN PROGRESS  7.  Patient will report >/= 63/80 on LEFS to demonstrate improved functional ability with decreased pain interference. Baseline: 54 / 80 = 67.5 % Goal status: IN PROGRESS   8.  Patient to report ability to perform ADLs, household, and leisure activities without limitation due to low back or B hip pain, LOM or weakness. Baseline:  Goal status: IN PROGRESS   PLAN:  PT FREQUENCY: 2x/week  PT DURATION: 6 weeks  PLANNED INTERVENTIONS: Therapeutic exercises, Therapeutic activity, Neuromuscular re-education, Balance training, Gait training, Patient/Family education, Self Care, Joint mobilization, Aquatic Therapy, Dry Needling, Electrical stimulation, Spinal manipulation, Spinal mobilization, Cryotherapy, Moist heat,  Taping, Traction, Ultrasound, Manual therapy, and Re-evaluation  PLAN FOR NEXT SESSION: Assess response to DN; gently progress lumbopelvic flexibility and strengthening - flexion preference?, updating HEP as indicated; MT +/- DN to address abnormal muscle tension in glutes and piriformis   Marry Guan, PT 04/01/2023, 10:20 AM

## 2023-04-05 ENCOUNTER — Ambulatory Visit: Payer: Medicare HMO

## 2023-04-07 ENCOUNTER — Telehealth: Payer: Self-pay | Admitting: Cardiovascular Disease

## 2023-04-07 DIAGNOSIS — E785 Hyperlipidemia, unspecified: Secondary | ICD-10-CM

## 2023-04-07 MED ORDER — ATORVASTATIN CALCIUM 80 MG PO TABS
80.0000 mg | ORAL_TABLET | Freq: Every day | ORAL | 0 refills | Status: DC
Start: 2023-04-07 — End: 2023-08-18

## 2023-04-07 NOTE — Telephone Encounter (Signed)
Pt's medication was sent to pt's pharmacy as requested. Confirmation received.  °

## 2023-04-07 NOTE — Telephone Encounter (Signed)
*  STAT* If patient is at the pharmacy, call can be transferred to refill team.   1. Which medications need to be refilled? (please list name of each medication and dose if known) atorvastatin (LIPITOR) 80 MG tablet    2. Would you like to learn more about the convenience, safety, & potential cost savings by using the [facility]?   3. Are you open to using the [facility] (Type [facility].  ).   4. Which pharmacy/location (including street and city if local pharmacy) is medication to be sent to? [facility] - [place], [place] - [address] [facility] AT Portal to Registered [facility] Sites    5. Do they need a 30 day or 90 day supply? 30 day  Patient has appt schedule for 9/27 and will be out of medication before her appt.

## 2023-04-11 ENCOUNTER — Encounter: Payer: Self-pay | Admitting: Physical Therapy

## 2023-04-11 ENCOUNTER — Ambulatory Visit: Payer: Medicare HMO | Admitting: Physical Therapy

## 2023-04-11 DIAGNOSIS — M5459 Other low back pain: Secondary | ICD-10-CM

## 2023-04-11 DIAGNOSIS — M62838 Other muscle spasm: Secondary | ICD-10-CM | POA: Diagnosis not present

## 2023-04-11 DIAGNOSIS — M25551 Pain in right hip: Secondary | ICD-10-CM

## 2023-04-11 DIAGNOSIS — M5416 Radiculopathy, lumbar region: Secondary | ICD-10-CM | POA: Diagnosis not present

## 2023-04-11 DIAGNOSIS — M6281 Muscle weakness (generalized): Secondary | ICD-10-CM

## 2023-04-11 DIAGNOSIS — M25552 Pain in left hip: Secondary | ICD-10-CM | POA: Diagnosis not present

## 2023-04-11 NOTE — Therapy (Signed)
OUTPATIENT PHYSICAL THERAPY TREATMENT   Patient Name: [name] MRN: [id number] DOB:May 13, 1951, 72 y.o., female Today's Date: 04/11/2023  END OF SESSION:  PT End of Session - 04/11/23 1445     Visit Number 3    Date for PT Re-Evaluation 05/03/23    Authorization Type [employer]    Progress Note Due on Visit 10    PT Start Time 1445    PT Stop Time 1529    PT Time Calculation (min) 44 min    Activity Tolerance Patient tolerated treatment well    Behavior During Therapy WFL for tasks assessed/performed               Past Medical History:  Diagnosis Date   Allergy    "dust mite residue, mold, mildew, grapefruit, chili peppers, cat dander, some weeds" (03/07/2018)   Anxiety    Arthritis    "minor; right knee, left hip" (03/07/2018)   Bruises easily    Cancer of left breast (HCC)    double mastectomy 01/26/2011   Childhood asthma    Contact lens/glasses fitting    Coronary artery disease    a.  She underwent LHC 7/23 which revealed 95% stenosis of proximal LAD, managed with PCI/DES.    Depression    Essential hypertension    GERD (gastroesophageal reflux disease)    "at times" (03/07/2018)   Heart murmur    Hiatal hernia    High cholesterol    History of kidney stones    "lots; no OR" (03/07/2018)   Osteoporosis    Rectal bleeding    hemorroid   Ringing in ears, bilateral    "since ~ 06/2017" (03/07/2018)   Sinus drainage    Past Surgical History:  Procedure Laterality Date   APPENDECTOMY     BREAST LUMPECTOMY W/ NEEDLE LOCALIZATION Left 10/05/2010   lumpectomy   BREAST SURGERY     CORONARY STENT INTERVENTION N/A 03/07/2018   Procedure: CORONARY STENT INTERVENTION;  Surgeon: Lennette Bihari, MD;  Location: [facility];  Service: Cardiovascular;  Laterality: N/A;   LEFT HEART CATH AND CORONARY ANGIOGRAPHY N/A 03/07/2018   Procedure: LEFT HEART CATH AND CORONARY ANGIOGRAPHY;  Surgeon: Lennette Bihari, MD;  Location: [facility];  Service:  Cardiovascular;  Laterality: N/A;   MASTECTOMY Right 01/26/2011   MASTECTOMY COMPLETE / SIMPLE W/ SENTINEL NODE BIOPSY Left 01/26/2011   OVARIAN CYST SURGERY  1997, 2001, 2008   Patient Active Problem List   Diagnosis Date Noted   Prediabetes 03/09/2023   Fatigue associated with anemia 06/13/2018   Depression 06/13/2018   RLS (restless legs syndrome) 04/05/2018   Atypical chest pain 03/10/2018   Elevated blood pressure reading 03/10/2018   Palpitations 03/10/2018   Chest pain 03/09/2018   Hyperlipidemia 03/08/2018   Coronary artery disease involving native coronary artery of native heart with unstable angina pectoris (HCC) 03/07/2018   Unstable angina (HCC)    Cough 01/02/2013   Anxiety state 01/02/2013   Breast cancer (DCIS), stage 0, Left, receptor +, dx 2012 09/08/2010    PCP: Pearline Cables, MD   REFERRING PROVIDER: Pearline Cables, MD   REFERRING DIAG: [phone] (ICD-10-CM) - Bilateral hip pain  (Lower back and hip pain)  THERAPY DIAG:  Pain of both hip joints  Other low back pain  Radiculopathy, lumbar region  Other muscle spasm  Muscle weakness (generalized)  RATIONALE FOR EVALUATION AND TREATMENT: Rehabilitation  ONSET DATE: >1 yr; exacerbation ~2 weeks ago  NEXT MD VISIT: none scheduled - PRN   SUBJECTIVE:                                                                                                                                                                                                         SUBJECTIVE STATEMENT: Pt reports pain still most intense with initiation of movement after periods of prolonged inactivity, subsiding with repetition. She feels some benefit from DN and would like to try it again.  EVAL:  Pt reports the majority of her problem is in her L hip - okay when sitting or laying down, but when she goes to get up she notes tremendous pressure in L hip and leg. She reports a h/o a fall ~1 yr ago. She has experienced  instances where her back locks up - recent episode after going to get up after being on hands and knees painting baseboards ~2 weeks ago. She does note h/o "tailbone" pain. She uses ice for her back. She also notes cracking in her neck as well as tender places t/o her body. Avoids pain meds because they make her constipated.  She notes she has had more sciatic pain in the last moth than before.  PAIN: Are you having pain? Yes: NPRS scale: 0/10 currently, upon rising from bed 6.5/10 Pain location: L lateral hip & thigh Pain description: tender to touch Aggravating factors: moving after prolonged stationary activity Relieving factors: ice, Tylenol  PERTINENT HISTORY:  OA, osteoporosis, HTN, CAD, breast cancer s/p B mastectomies, GERD, anxiety, depression, RLS (affects R side more than L)  PRECAUTIONS: None  RED FLAGS: None  WEIGHT BEARING RESTRICTIONS: No  FALLS:  Has patient fallen in last 6 months? Yes. Number of falls ~1  LIVING ENVIRONMENT: Lives with: lives with their spouse Lives in: House/apartment Stairs: Yes: Internal: 15 steps; on left going up and External: 1 steps; none Has following equipment at home: Single point cane  OCCUPATION: Retired  PLOF: Independent and Leisure: sewing, crafts, Art gallery manager, Holiday representative; no regular exercise, but thinking about doing some water exercises at the [facility]  PATIENT GOALS: "Relief from the pain."   OBJECTIVE: (objective measures completed at initial evaluation unless otherwise dated)  DIAGNOSTIC FINDINGS:  B hip & pelvis x-rays ordered but not yet completed as of eval  02/08/22 - Lumbar spine x-ray: IMPRESSION: No acute fracture or dislocation. Mild lower lumbar spine facet joint degenerative joint changes stable.  02/08/22 - B hip x-rays: IMPRESSION: No acute osseous injury of the bilateral hips.  PATIENT SURVEYS:  Modified Oswestry 14 /  50 = 28.0 %  LEFS 54 / 80 = 67.5 %  SCREENING FOR RED FLAGS: Bowel or  bladder incontinence: No - but feels like her control is getting weaker Spinal tumors: No Cauda equina syndrome: No Compression fracture: No Abdominal aneurysm: No  COGNITION:  Overall cognitive status: Within functional limits for tasks assessed    SENSATION: Intermittent L>R sciatic pain, numbness & tingling into feet  MUSCLE LENGTH: Hamstrings: mild tight B ITB: mod tight B Piriformis: mod/severe tight B Hip flexors: mild tight B Quads: mild tight B Heelcord: NT  POSTURE:  No Significant postural limitations  PALPATION: Increased muscle tension and tenderness to palpation in bilateral upper and lateral glutes and piriformis  LUMBAR ROM:   Active  A/PROM  eval  Flexion Fingertips to toes - HS tightness  Extension WFL - abdominal and hip flexor tightness  Right lateral flexion WFL - QL tightness  Left lateral flexion WFL - QL tightness  Right rotation WFL  Left rotation WFL  (Blank rows = not tested)  LOWER EXTREMITY ROM:    B LE WFL other than restrictions in B hip IR/ER due to the muscle tightness as above  LOWER EXTREMITY MMT:    MMT Right eval Left eval  Hip flexion 4- 4-  Hip extension 3+ 4  Hip abduction 4- 4+  Hip adduction 4 4+  Hip internal rotation 3+ * 4- *  Hip external rotation 4- 4-  Knee flexion 4 4  Knee extension 4+ 4+  Ankle dorsiflexion 4 4  Ankle plantarflexion 4 (15 SLS HR) 4 (12 SLS HR)  Ankle inversion    Ankle eversion     (Blank rows = not tested; * = pain)  LUMBAR SPECIAL TESTS:  Straight leg raise test: Negative and FABER test: Positive  LOWER EXTREMITY SPECIAL TESTS:  Hip special tests: Luisa Hart (FABER) test: positive  and Ober's test: positive    TODAY'S TREATMENT:   04/11/23 THERAPEUTIC EXERCISE: to improve flexibility, strength and mobility.  Demonstration, verbal and tactile cues throughout for technique.  Rec Bike - L3 x 6 min Hooklying RTB alt bent-knee fallout 10 x 3" Hooklying TrA + RTB hip flexion march 10 x  3" Bridge + RTB hip ABD isometric 10 x 3-5" S/L clam w/o resistance 10 x 3" bil S/L reverse clam 10 x 3" - L only, deferred on R d/t increased pain Hooklying figure-4 piriformis stretch with slight overpressure 3 x 10-15" B - better tolerated today  THERAPEUTIC ACTIVITIES: Provided education in proper posture and body mechanics for typical daily positioning and household chores to minimize strain on low back and neck.   04/01/23 THERAPEUTIC EXERCISE: to improve flexibility, strength and mobility.  Demonstration, verbal and tactile cues throughout for technique.  Rec Bike - L2 x 6 min SKTC with opposite knee straight 2 x 30" B - less stretch noted on R SKTC in mod thomas position x 30" R - better stretch Hooklying KTOS piriformis stretch x 30" B Hooklying figure-4 piriformis stretch x 30" B - better tolerated today Hooklying LTR 2 x 10" - not getting much stretch S/L open book stretch 5 x 5" - too intense at shoulder and still minimal thoracolumbar stretch Hooklying ITB/glute stretch with lateral pull crossing one LE over opp knee x 30" - minimal stretch noted Supine cross-body ITB stretch with strap 2 x 30" B - better stretch but felt mostly in glutes Side-sitting hip hinge figure-4 piriformis stretch x 30" B    MANUAL THERAPY: To  promote normalized muscle tension, improved flexibility, and reduced pain. Skilled palpation and monitoring of soft tissue during DN Trigger Point Dry-Needling  Treatment instructions: Expect mild to moderate muscle soreness. Patient verbalized understanding of these instructions and education. Patient Consent Given: Yes Education handout provided: Yes Muscles treated: B glute max/med/min and piriformis Electrical stimulation performed: No Parameters: N/A Treatment response/outcome: Twitch Response Elicited and Palpable Increase in Muscle Length STM/DTM, manual TPR and pin & stretch to muscles addressed with DN    03/22/23 - Eval THERAPEUTIC EXERCISE: to  improve flexibility, strength and mobility.  Demonstration, verbal and tactile cues throughout for technique.  SKTC 2 x 30" - 1st rep with opposite knee bent, 2nd rep with opposite knee straight - no preference noted Hooklying LTR 2 x 10" Hooklying KTOS piriformis stretch x 30" B Hooklying figure-4 piriformis stretch x 30" B - deferred d/t increased pain Seated hip hinge figure-4 piriformis stretch x 30" B - deferred d/t increased pain Side-sitting hip hinge figure-4 piriformis stretch x 30" B - better tolerated but still uncomfortable - pt cautioned to avoid pushing too hard into stretch   Patient Education - Trigger Point Dry Needling   PATIENT EDUCATION:  Education details: HEP progression - lumbopelvic strengthening and posture and body mechanics for typical daily postioning, mobility and household tasks Person educated: Patient Education method: Programmer, multimedia, Facilities manager, Verbal cues, and Handouts Education comprehension: verbalized understanding, returned demonstration, verbal cues required, and needs further education  HOME EXERCISE PROGRAM: Access Code: [email or web address] URL: [email or web address] Date: 04/11/2023 Prepared by: Glenetta Hew  Exercises - Single Knee to Chest Stretch  - 2 x daily - 7 x weekly - 3 reps - 30 sec hold - Supine Piriformis Stretch with Foot on Ground  - 2 x daily - 7 x weekly - 3 reps - 30 sec hold - Seated Table Piriformis Stretch  - 2 x daily - 7 x weekly - 3 reps - 30 sec hold - Supine Iliotibial Band Stretch with Strap  - 2 x daily - 7 x weekly - 3 reps - 30 sec hold - Hooklying Single Leg Bent Knee Fallouts with Resistance  - 1 x daily - 3 x weekly - 2 sets - 10 reps - 3 sec hold - Supine March with Resistance Band  - 1 x daily - 3 x weekly - 2 sets - 10 reps - 2-3 sec hold hold - Bridge with Resistance  - 1 x daily - 3 x weekly - 2 sets - 10 reps - 5 sec hold - Clamshell  - 1 x daily - 3 x weekly - 2 sets - 10 reps - 3 sec hold  Patient  Education - Trigger Point Dry Needling - Posture and Body Mechanics   ASSESSMENT:  CLINICAL IMPRESSION: [name] reports improving flexibility with SKTC and improving tolerance for piriformis stretches with HEP, however has been using some of the original stretches rather than the modified ones from last visit as she misplaced the handouts.  When offered to have handouts reprinted, she declined stating she wanted to continue with what she has been doing but did inquire about trying some of the originally deferred stretches as she feels like she can better tolerate them - encouraged patient to utilize stretches to the point of tolerance but not to force painful motion.  Progressed therapeutic exercises to enter basic core/lumbopelvic strengthening with updated HEP handouts provided.  Education also provided on proper posture and body mechanics for typical mobility and daily activities, with patient reporting  she feels like she has awareness of proper body mechanics.  She is uncertain of significant benefit from DN last visit but would be open to trying this again in future visits.  [name] will benefit from skilled PT to address above deficits to improve mobility and activity tolerance with decreased pain interference.   OBJECTIVE IMPAIRMENTS: decreased activity tolerance, decreased endurance, decreased knowledge of condition, decreased mobility, difficulty walking, decreased ROM, decreased strength, hypomobility, increased fascial restrictions, impaired perceived functional ability, increased muscle spasms, impaired flexibility, impaired sensation, improper body mechanics, postural dysfunction, and pain.   ACTIVITY LIMITATIONS: carrying, lifting, bending, sitting, standing, squatting, transfers, bed mobility, toileting, locomotion level, and caring for others  PARTICIPATION LIMITATIONS: meal prep, cleaning, laundry, driving, shopping, and yard work  PERSONAL FACTORS: Age, Fitness, Past/current  experiences, Time since onset of injury/illness/exacerbation, and 3+ comorbidities: OA, osteoporosis, HTN, CAD, breast cancer s/p B mastectomies, GERD, anxiety, depression, RLS (affects R side more than L)  are also affecting patient's functional outcome.   REHAB POTENTIAL: Good  CLINICAL DECISION MAKING: Stable/uncomplicated  EVALUATION COMPLEXITY: Low   GOALS: Goals reviewed with patient? Yes  SHORT TERM GOALS: Target date: 04/12/2023  Patient will be independent with initial HEP to improve outcomes and carryover.  Baseline: Initial HEP provided on eval Goal status: MET  04/11/23   2.  Patient will report centralization of radicular symptoms.  Baseline: Intermittent B LE radicular pain, numbness and tingling to feet Goal status: PARTIALLY MET  04/11/23 - still having feeling of pressure down through leg to ankle but less numbness and tingling  LONG TERM GOALS: Target date: 05/03/2023  Patient will be independent with ongoing/advanced HEP for self-management at home.  Baseline:  Goal status: IN PROGRESS  2.  Patient will report 50-75% improvement in low back pain and B hip pain to improve QOL.  Baseline: Intermittent but severe at times Goal status: IN PROGRESS  3.  Patient to demonstrate ability to achieve and maintain good spinal alignment/posturing and body mechanics needed for daily activities. Baseline:  Goal status: IN PROGRESS  4.  Patient will demonstrate full pain free lumbar ROM to perform ADLs.   Baseline: Refer to above lumbar ROM table Goal status: IN PROGRESS  5.  Patient will demonstrate improved B LE strength to >/= 4+/5 for improved stability and ease of mobility . Baseline: Refer to above LE MMT table Goal status: IN PROGRESS  6. Patient will report </= 16% on Modified Oswestry to demonstrate improved functional ability with decreased pain interference. Baseline: 14 / 50 = 28.0 % Goal status: IN PROGRESS  7.  Patient will report >/= 63/80 on LEFS to  demonstrate improved functional ability with decreased pain interference. Baseline: 54 / 80 = 67.5 % Goal status: IN PROGRESS   8.  Patient to report ability to perform ADLs, household, and leisure activities without limitation due to low back or B hip pain, LOM or weakness. Baseline:  Goal status: IN PROGRESS   PLAN:  PT FREQUENCY: 2x/week  PT DURATION: 6 weeks  PLANNED INTERVENTIONS: Therapeutic exercises, Therapeutic activity, Neuromuscular re-education, Balance training, Gait training, Patient/Family education, Self Care, Joint mobilization, Aquatic Therapy, Dry Needling, Electrical stimulation, Spinal manipulation, Spinal mobilization, Cryotherapy, Moist heat, Taping, Traction, Ultrasound, Manual therapy, and Re-evaluation  PLAN FOR NEXT SESSION: gently progress lumbopelvic flexibility and strengthening - flexion preference?, updating HEP as indicated; MT +/- DN to address abnormal muscle tension in glutes and piriformis   Marry Guan, PT 04/11/2023, 3:30 PM

## 2023-04-14 ENCOUNTER — Ambulatory Visit: Payer: Medicare HMO | Admitting: Physical Therapy

## 2023-04-14 ENCOUNTER — Encounter: Payer: Self-pay | Admitting: Physical Therapy

## 2023-04-14 DIAGNOSIS — M25551 Pain in right hip: Secondary | ICD-10-CM | POA: Diagnosis not present

## 2023-04-14 DIAGNOSIS — M6281 Muscle weakness (generalized): Secondary | ICD-10-CM

## 2023-04-14 DIAGNOSIS — M5459 Other low back pain: Secondary | ICD-10-CM | POA: Diagnosis not present

## 2023-04-14 DIAGNOSIS — M25552 Pain in left hip: Secondary | ICD-10-CM | POA: Diagnosis not present

## 2023-04-14 DIAGNOSIS — M5416 Radiculopathy, lumbar region: Secondary | ICD-10-CM | POA: Diagnosis not present

## 2023-04-14 DIAGNOSIS — M62838 Other muscle spasm: Secondary | ICD-10-CM | POA: Diagnosis not present

## 2023-04-14 NOTE — Therapy (Signed)
OUTPATIENT PHYSICAL THERAPY TREATMENT   Patient Name: [name] MRN: [id number] DOB:04-22-1951, 72 y.o., female Today's Date: 04/14/2023  END OF SESSION:  PT End of Session - 04/14/23 1456     Visit Number 4    Date for PT Re-Evaluation 05/03/23    Authorization Type [employer]    Progress Note Due on Visit 10    PT Start Time 1500   Pt arrived late   PT Stop Time 1532    PT Time Calculation (min) 32 min    Activity Tolerance Patient tolerated treatment well    Behavior During Therapy [place] for tasks assessed/performed                Past Medical History:  Diagnosis Date   Allergy    "dust mite residue, mold, mildew, grapefruit, chili peppers, cat dander, some weeds" (03/07/2018)   Anxiety    Arthritis    "minor; right knee, left hip" (03/07/2018)   Bruises easily    Cancer of left breast (HCC)    double mastectomy 01/26/2011   Childhood asthma    Contact lens/glasses fitting    Coronary artery disease    a.  She underwent LHC 7/23 which revealed 95% stenosis of proximal LAD, managed with PCI/DES.    Depression    Essential hypertension    GERD (gastroesophageal reflux disease)    "at times" (03/07/2018)   Heart murmur    Hiatal hernia    High cholesterol    History of kidney stones    "lots; no OR" (03/07/2018)   Osteoporosis    Rectal bleeding    hemorroid   Ringing in ears, bilateral    "since ~ 06/2017" (03/07/2018)   Sinus drainage    Past Surgical History:  Procedure Laterality Date   APPENDECTOMY     BREAST LUMPECTOMY W/ NEEDLE LOCALIZATION Left 10/05/2010   lumpectomy   BREAST SURGERY     CORONARY STENT INTERVENTION N/A 03/07/2018   Procedure: CORONARY STENT INTERVENTION;  Surgeon: Lennette Bihari, MD;  Location: [facility];  Service: Cardiovascular;  Laterality: N/A;   LEFT HEART CATH AND CORONARY ANGIOGRAPHY N/A 03/07/2018   Procedure: LEFT HEART CATH AND CORONARY ANGIOGRAPHY;  Surgeon: Lennette Bihari, MD;  Location: [facility];  Service: Cardiovascular;  Laterality: N/A;   MASTECTOMY Right 01/26/2011   MASTECTOMY COMPLETE / SIMPLE W/ SENTINEL NODE BIOPSY Left 01/26/2011   OVARIAN CYST SURGERY  1997, 2001, 2008   Patient Active Problem List   Diagnosis Date Noted   Prediabetes 03/09/2023   Fatigue associated with anemia 06/13/2018   Depression 06/13/2018   RLS (restless legs syndrome) 04/05/2018   Atypical chest pain 03/10/2018   Elevated blood pressure reading 03/10/2018   Palpitations 03/10/2018   Chest pain 03/09/2018   Hyperlipidemia 03/08/2018   Coronary artery disease involving native coronary artery of native heart with unstable angina pectoris (HCC) 03/07/2018   Unstable angina (HCC)    Cough 01/02/2013   Anxiety state 01/02/2013   Breast cancer (DCIS), stage 0, Left, receptor +, dx 2012 09/08/2010    PCP: Pearline Cables, MD   REFERRING PROVIDER: Pearline Cables, MD   REFERRING DIAG: [phone] (ICD-10-CM) - Bilateral hip pain  (Lower back and hip pain)  THERAPY DIAG:  Pain of both hip joints  Other low back pain  Radiculopathy, lumbar region  Other muscle spasm  Muscle weakness (generalized)  RATIONALE FOR EVALUATION AND TREATMENT: Rehabilitation  ONSET DATE: >1 yr;  exacerbation ~2 weeks ago  NEXT MD VISIT: none scheduled - PRN   SUBJECTIVE:                                                                                                                                                                                                         SUBJECTIVE STATEMENT: Pt reporting no changes today.  Pain was a little bit less yesterday but back to baseline today.  She is considering asking for imaging as she feels that her pain is different than the pain she has previously had in her hips.  She arrived 11 minutes late and then was on the phone further delaying the start of the PT visit.  EVAL:  Pt reports the majority of her problem is in her L hip - okay when sitting  or laying down, but when she goes to get up she notes tremendous pressure in L hip and leg. She reports a h/o a fall ~1 yr ago. She has experienced instances where her back locks up - recent episode after going to get up after being on hands and knees painting baseboards ~2 weeks ago. She does note h/o "tailbone" pain. She uses ice for her back. She also notes cracking in her neck as well as tender places t/o her body. Avoids pain meds because they make her constipated.  She notes she has had more sciatic pain in the last moth than before.  PAIN: Are you having pain? Yes: NPRS scale: 0/10 currently, upon rising from bed 8/10 Pain location: L lateral hip & thigh Pain description: tender to touch Aggravating factors: moving after prolonged stationary activity Relieving factors: ice, Tylenol  PERTINENT HISTORY:  OA, osteoporosis, HTN, CAD, breast cancer s/p B mastectomies, GERD, anxiety, depression, RLS (affects R side more than L)  PRECAUTIONS: None  RED FLAGS: None  WEIGHT BEARING RESTRICTIONS: No  FALLS:  Has patient fallen in last 6 months? Yes. Number of falls ~1  LIVING ENVIRONMENT: Lives with: lives with their spouse Lives in: House/apartment Stairs: Yes: Internal: 15 steps; on left going up and External: 1 steps; none Has following equipment at home: Single point cane  OCCUPATION: Retired  PLOF: Independent and Leisure: sewing, crafts, Art gallery manager, Holiday representative; no regular exercise, but thinking about doing some water exercises at the [facility]  PATIENT GOALS: "Relief from the pain."   OBJECTIVE: (objective measures completed at initial evaluation unless otherwise dated)  DIAGNOSTIC FINDINGS:  B hip & pelvis x-rays ordered but not yet completed as of eval  02/08/22 - Lumbar spine x-ray: IMPRESSION: No  acute fracture or dislocation. Mild lower lumbar spine facet joint degenerative joint changes stable.  02/08/22 - B hip x-rays: IMPRESSION: No acute osseous injury  of the bilateral hips.  PATIENT SURVEYS:  Modified Oswestry 14 / 50 = 28.0 %  LEFS 54 / 80 = 67.5 %  SCREENING FOR RED FLAGS: Bowel or bladder incontinence: No - but feels like her control is getting weaker Spinal tumors: No Cauda equina syndrome: No Compression fracture: No Abdominal aneurysm: No  COGNITION:  Overall cognitive status: Within functional limits for tasks assessed    SENSATION: Intermittent L>R sciatic pain, numbness & tingling into feet  MUSCLE LENGTH: Hamstrings: mild tight B ITB: mod tight B Piriformis: mod/severe tight B Hip flexors: mild tight B Quads: mild tight B Heelcord: NT  POSTURE:  No Significant postural limitations  PALPATION: Increased muscle tension and tenderness to palpation in bilateral upper and lateral glutes and piriformis  LUMBAR ROM:   Active  A/PROM  eval  Flexion Fingertips to toes - HS tightness  Extension WFL - abdominal and hip flexor tightness  Right lateral flexion WFL - QL tightness  Left lateral flexion WFL - QL tightness  Right rotation WFL  Left rotation WFL  (Blank rows = not tested)  LOWER EXTREMITY ROM:    B LE WFL other than restrictions in B hip IR/ER due to the muscle tightness as above  LOWER EXTREMITY MMT:    MMT Right eval Left eval  Hip flexion 4- 4-  Hip extension 3+ 4  Hip abduction 4- 4+  Hip adduction 4 4+  Hip internal rotation 3+ * 4- *  Hip external rotation 4- 4-  Knee flexion 4 4  Knee extension 4+ 4+  Ankle dorsiflexion 4 4  Ankle plantarflexion 4 (15 SLS HR) 4 (12 SLS HR)  Ankle inversion    Ankle eversion     (Blank rows = not tested; * = pain)  LUMBAR SPECIAL TESTS:  Straight leg raise test: Negative and FABER test: Positive  LOWER EXTREMITY SPECIAL TESTS:  Hip special tests: Luisa Hart (FABER) test: positive  and Ober's test: positive    TODAY'S TREATMENT:   04/14/23 THERAPEUTIC EXERCISE: to improve flexibility, strength and mobility.  Demonstration, verbal and tactile  cues throughout for technique.  Rec Bike - L3 x 4 min  MANUAL THERAPY: To promote normalized muscle tension, improved flexibility, and reduced pain. Skilled palpation and monitoring of soft tissue during DN Trigger Point Dry-Needling  Treatment instructions: Expect mild to moderate muscle soreness. Patient verbalized understanding of these instructions and education. Patient Consent Given: Yes Education handout provided: Previously provided Muscles treated: L iliacus, gluteus maximus/medius/minimus, medial and lateral piriformis Electrical stimulation performed: No Parameters: N/A Treatment response/outcome: Twitch Response Elicited and Palpable Increase in Muscle Length STM/DTM, manual TPR and pin & stretch to muscles addressed with DN Ktape - Kinesio-Tex GOLD (sensitive skin variety) - L glutes   04/11/23 THERAPEUTIC EXERCISE: to improve flexibility, strength and mobility.  Demonstration, verbal and tactile cues throughout for technique.  Rec Bike - L3 x 6 min Hooklying RTB alt bent-knee fallout 10 x 3" Hooklying TrA + RTB hip flexion march 10 x 3" Bridge + RTB hip ABD isometric 10 x 3-5" S/L clam w/o resistance 10 x 3" bil S/L reverse clam 10 x 3" - L only, deferred on R d/t increased pain Hooklying figure-4 piriformis stretch with slight overpressure 3 x 10-15" B - better tolerated today  THERAPEUTIC ACTIVITIES: Provided education in proper posture and body mechanics  for typical daily positioning and household chores to minimize strain on low back and neck.   04/01/23 THERAPEUTIC EXERCISE: to improve flexibility, strength and mobility.  Demonstration, verbal and tactile cues throughout for technique.  Rec Bike - L2 x 6 min SKTC with opposite knee straight 2 x 30" B - less stretch noted on R SKTC in mod thomas position x 30" R - better stretch Hooklying KTOS piriformis stretch x 30" B Hooklying figure-4 piriformis stretch x 30" B - better tolerated today Hooklying LTR 2 x 10" -  not getting much stretch S/L open book stretch 5 x 5" - too intense at shoulder and still minimal thoracolumbar stretch Hooklying ITB/glute stretch with lateral pull crossing one LE over opp knee x 30" - minimal stretch noted Supine cross-body ITB stretch with strap 2 x 30" B - better stretch but felt mostly in glutes Side-sitting hip hinge figure-4 piriformis stretch x 30" B    MANUAL THERAPY: To promote normalized muscle tension, improved flexibility, and reduced pain. Skilled palpation and monitoring of soft tissue during DN Trigger Point Dry-Needling  Treatment instructions: Expect mild to moderate muscle soreness. Patient verbalized understanding of these instructions and education. Patient Consent Given: Yes Education handout provided: Yes Muscles treated: B glute max/med/min and piriformis Electrical stimulation performed: No Parameters: N/A Treatment response/outcome: Twitch Response Elicited and Palpable Increase in Muscle Length STM/DTM, manual TPR and pin & stretch to muscles addressed with DN   PATIENT EDUCATION:  Education details: role of DN, DN rational, procedure, outcomes, potential side effects, and recommended post-treatment exercises/activity, and Ktape wearing and removal instructions Person educated: Patient Education method: Explanation and Handouts Education comprehension: verbalized understanding and needs further education  HOME EXERCISE PROGRAM: Access Code: [email or web address] URL: [email or web address] Date: 04/11/2023 Prepared by: Glenetta Hew  Exercises - Single Knee to Chest Stretch  - 2 x daily - 7 x weekly - 3 reps - 30 sec hold - Supine Piriformis Stretch with Foot on Ground  - 2 x daily - 7 x weekly - 3 reps - 30 sec hold - Seated Table Piriformis Stretch  - 2 x daily - 7 x weekly - 3 reps - 30 sec hold - Supine Iliotibial Band Stretch with Strap  - 2 x daily - 7 x weekly - 3 reps - 30 sec hold - Hooklying Single Leg Bent Knee Fallouts with  Resistance  - 1 x daily - 3 x weekly - 2 sets - 10 reps - 3 sec hold - Supine March with Resistance Band  - 1 x daily - 3 x weekly - 2 sets - 10 reps - 2-3 sec hold hold - Bridge with Resistance  - 1 x daily - 3 x weekly - 2 sets - 10 reps - 5 sec hold - Clamshell  - 1 x daily - 3 x weekly - 2 sets - 10 reps - 3 sec hold  Patient Education - Trigger Point Dry Needling - Posture and Body Mechanics   ASSESSMENT:  CLINICAL IMPRESSION: [name] reports she is considering asking the doctor to proceed with imaging for as she feels like her current pain is different than her previous hip pain that she experienced last year.  She continues to experience ongoing TTP in L lateral glutes and piriformis, with increased muscle tension and TTP also noted in L iliacus - addressed with MT incorporating DN eliciting strong twitch responses resulting in palpable reduction in muscle tension.  Initiated trial of Kinesiotape applied to L glutes using sensitive  skin variety due to history of reaction to adhesive on patches that she has previously worn.  Wearing instructions provided with patient cautioned to remove tape if irritation noted.  Patient inquiring about why her [employer] will not cover iontophoresis and plans to reach out directly to the insurance company to see if she can get approval to try this.  Treatment session was limited due to patient late arrival and then on the phone.  Calen will benefit from skilled PT to address above deficits to improve mobility and activity tolerance with decreased pain interference.   OBJECTIVE IMPAIRMENTS: decreased activity tolerance, decreased endurance, decreased knowledge of condition, decreased mobility, difficulty walking, decreased ROM, decreased strength, hypomobility, increased fascial restrictions, impaired perceived functional ability, increased muscle spasms, impaired flexibility, impaired sensation, improper body mechanics, postural dysfunction, and pain.    ACTIVITY LIMITATIONS: carrying, lifting, bending, sitting, standing, squatting, transfers, bed mobility, toileting, locomotion level, and caring for others  PARTICIPATION LIMITATIONS: meal prep, cleaning, laundry, driving, shopping, and yard work  PERSONAL FACTORS: Age, Fitness, Past/current experiences, Time since onset of injury/illness/exacerbation, and 3+ comorbidities: OA, osteoporosis, HTN, CAD, breast cancer s/p B mastectomies, GERD, anxiety, depression, RLS (affects R side more than L)  are also affecting patient's functional outcome.   REHAB POTENTIAL: Good  CLINICAL DECISION MAKING: Stable/uncomplicated  EVALUATION COMPLEXITY: Low   GOALS: Goals reviewed with patient? Yes  SHORT TERM GOALS: Target date: 04/12/2023  Patient will be independent with initial HEP to improve outcomes and carryover.  Baseline: Initial HEP provided on eval Goal status: MET  04/11/23   2.  Patient will report centralization of radicular symptoms.  Baseline: Intermittent B LE radicular pain, numbness and tingling to feet Goal status: PARTIALLY MET  04/11/23 - still having feeling of pressure down through leg to ankle but less numbness and tingling  LONG TERM GOALS: Target date: 05/03/2023  Patient will be independent with ongoing/advanced HEP for self-management at home.  Baseline:  Goal status: IN PROGRESS  2.  Patient will report 50-75% improvement in low back pain and B hip pain to improve QOL.  Baseline: Intermittent but severe at times Goal status: IN PROGRESS  3.  Patient to demonstrate ability to achieve and maintain good spinal alignment/posturing and body mechanics needed for daily activities. Baseline:  Goal status: IN PROGRESS  4.  Patient will demonstrate full pain free lumbar ROM to perform ADLs.   Baseline: Refer to above lumbar ROM table Goal status: IN PROGRESS  5.  Patient will demonstrate improved B LE strength to >/= 4+/5 for improved stability and ease of mobility  . Baseline: Refer to above LE MMT table Goal status: IN PROGRESS  6. Patient will report </= 16% on Modified Oswestry to demonstrate improved functional ability with decreased pain interference. Baseline: 14 / 50 = 28.0 % Goal status: IN PROGRESS  7.  Patient will report >/= 63/80 on LEFS to demonstrate improved functional ability with decreased pain interference. Baseline: 54 / 80 = 67.5 % Goal status: IN PROGRESS   8.  Patient to report ability to perform ADLs, household, and leisure activities without limitation due to low back or B hip pain, LOM or weakness. Baseline:  Goal status: IN PROGRESS   PLAN:  PT FREQUENCY: 2x/week  PT DURATION: 6 weeks  PLANNED INTERVENTIONS: Therapeutic exercises, Therapeutic activity, Neuromuscular re-education, Balance training, Gait training, Patient/Family education, Self Care, Joint mobilization, Aquatic Therapy, Dry Needling, Electrical stimulation, Spinal manipulation, Spinal mobilization, Cryotherapy, Moist heat, Taping, Traction, Ultrasound, Manual therapy, and  Re-evaluation  PLAN FOR NEXT SESSION: Assess response to DN and taping; gently progress lumbopelvic flexibility and strengthening - flexion preference?, updating HEP as indicated; MT +/- DN to address abnormal muscle tension in glutes and piriformis; potential further kinesiotaping as benefit noted   Marry Guan, PT 04/14/2023, 5:23 PM

## 2023-04-17 ENCOUNTER — Other Ambulatory Visit: Payer: Self-pay | Admitting: Family Medicine

## 2023-04-17 ENCOUNTER — Other Ambulatory Visit: Payer: Self-pay | Admitting: Cardiovascular Disease

## 2023-04-17 DIAGNOSIS — I25118 Atherosclerotic heart disease of native coronary artery with other forms of angina pectoris: Secondary | ICD-10-CM

## 2023-04-17 DIAGNOSIS — R002 Palpitations: Secondary | ICD-10-CM

## 2023-04-17 DIAGNOSIS — J309 Allergic rhinitis, unspecified: Secondary | ICD-10-CM

## 2023-04-19 ENCOUNTER — Other Ambulatory Visit: Payer: Self-pay | Admitting: Cardiovascular Disease

## 2023-04-19 DIAGNOSIS — E785 Hyperlipidemia, unspecified: Secondary | ICD-10-CM

## 2023-04-29 ENCOUNTER — Ambulatory Visit: Payer: Medicare HMO | Attending: Family Medicine | Admitting: Physical Therapy

## 2023-04-29 ENCOUNTER — Encounter: Payer: Self-pay | Admitting: Physical Therapy

## 2023-04-29 DIAGNOSIS — M62838 Other muscle spasm: Secondary | ICD-10-CM | POA: Insufficient documentation

## 2023-04-29 DIAGNOSIS — M5416 Radiculopathy, lumbar region: Secondary | ICD-10-CM | POA: Insufficient documentation

## 2023-04-29 DIAGNOSIS — M6281 Muscle weakness (generalized): Secondary | ICD-10-CM | POA: Insufficient documentation

## 2023-04-29 DIAGNOSIS — M25551 Pain in right hip: Secondary | ICD-10-CM | POA: Diagnosis not present

## 2023-04-29 DIAGNOSIS — M25552 Pain in left hip: Secondary | ICD-10-CM | POA: Diagnosis not present

## 2023-04-29 DIAGNOSIS — M5459 Other low back pain: Secondary | ICD-10-CM | POA: Diagnosis not present

## 2023-04-29 NOTE — Therapy (Addendum)
OUTPATIENT PHYSICAL THERAPY TREATMENT   Patient Name: [name] MRN: [id number] DOB:[birth year], 72 y.o., female Today's Date: 04/29/2023  END OF SESSION:  PT End of Session - 04/29/23 1019     Visit Number 5    Date for PT Re-Evaluation 05/03/23    Authorization Type [employer]    Progress Note Due on Visit 10    PT Start Time 1019    PT Stop Time 1103    PT Time Calculation (min) 44 min    Activity Tolerance Patient tolerated treatment well    Behavior During Therapy WFL for tasks assessed/performed                Past Medical History:  Diagnosis Date   Allergy    "dust mite residue, mold, mildew, grapefruit, chili peppers, cat dander, some weeds" (03/07/2018)   Anxiety    Arthritis    "minor; right knee, left hip" (03/07/2018)   Bruises easily    Cancer of left breast (HCC)    double mastectomy 01/26/2011   Childhood asthma    Contact lens/glasses fitting    Coronary artery disease    a.  She underwent LHC 7/23 which revealed 95% stenosis of proximal LAD, managed with PCI/DES.    Depression    Essential hypertension    GERD (gastroesophageal reflux disease)    "at times" (03/07/2018)   Heart murmur    Hiatal hernia    High cholesterol    History of kidney stones    "lots; no OR" (03/07/2018)   Osteoporosis    Rectal bleeding    hemorroid   Ringing in ears, bilateral    "since ~ 06/2017" (03/07/2018)   Sinus drainage    Past Surgical History:  Procedure Laterality Date   APPENDECTOMY     BREAST LUMPECTOMY W/ NEEDLE LOCALIZATION Left 10/05/2010   lumpectomy   BREAST SURGERY     CORONARY STENT INTERVENTION N/A 03/07/2018   Procedure: CORONARY STENT INTERVENTION;  Surgeon: Lennette Bihari, MD;  Location: [facility];  Service: Cardiovascular;  Laterality: N/A;   LEFT HEART CATH AND CORONARY ANGIOGRAPHY N/A 03/07/2018   Procedure: LEFT HEART CATH AND CORONARY ANGIOGRAPHY;  Surgeon: Lennette Bihari, MD;  Location: [facility];  Service:  Cardiovascular;  Laterality: N/A;   MASTECTOMY Right 01/26/2011   MASTECTOMY COMPLETE / SIMPLE W/ SENTINEL NODE BIOPSY Left 01/26/2011   OVARIAN CYST SURGERY  1997, 2001, 2008   Patient Active Problem List   Diagnosis Date Noted   Prediabetes 03/09/2023   Fatigue associated with anemia 06/13/2018   Depression 06/13/2018   RLS (restless legs syndrome) 04/05/2018   Atypical chest pain 03/10/2018   Elevated blood pressure reading 03/10/2018   Palpitations 03/10/2018   Chest pain 03/09/2018   Hyperlipidemia 03/08/2018   Coronary artery disease involving native coronary artery of native heart with unstable angina pectoris (HCC) 03/07/2018   Unstable angina (HCC)    Cough 01/02/2013   Anxiety state 01/02/2013   Breast cancer (DCIS), stage 0, Left, receptor +, dx 2012 09/08/2010    PCP: Pearline Cables, MD   REFERRING PROVIDER: Pearline Cables, MD   REFERRING DIAG: [id number] (ICD-10-CM) - Bilateral hip pain  (Lower back and hip pain)  THERAPY DIAG:  Pain of both hip joints  Other low back pain  Radiculopathy, lumbar region  Other muscle spasm  Muscle weakness (generalized)  RATIONALE FOR EVALUATION AND TREATMENT: Rehabilitation  ONSET DATE: >1 yr; exacerbation ~2 weeks ago  NEXT MD VISIT: none scheduled - PRN   SUBJECTIVE:                                                                                                                                                                                                         SUBJECTIVE STATEMENT: Pt reports a new issue this morning - stiffness and popping in her neck - worse since traveling recently.  She did not note any benefit from the taping.  EVAL:  Pt reports the majority of her problem is in her L hip - okay when sitting or laying down, but when she goes to get up she notes tremendous pressure in L hip and leg. She reports a h/o a fall ~1 yr ago. She has experienced instances where her back locks up - recent  episode after going to get up after being on hands and knees painting baseboards ~2 weeks ago. She does note h/o "tailbone" pain. She uses ice for her back. She also notes cracking in her neck as well as tender places t/o her body. Avoids pain meds because they make her constipated.  She notes she has had more sciatic pain in the last moth than before.  PAIN: Are you having pain? Yes: NPRS scale: 0/10 currently, upon initiation of movement 6/10 Pain location: L lateral hip & thigh, at times extending into L calf Pain description: tender to touch Aggravating factors: moving after prolonged stationary activity Relieving factors: work it out - walking, heat > ice  Are you having pain? Yes: NPRS scale: on L 3/10, on R 5/10 Pain location: L lateral neck Pain description: popping on R, "binding" on L Aggravating factors: unknown Relieving factors: ?  PERTINENT HISTORY:  OA, osteoporosis, HTN, CAD, breast cancer s/p B mastectomies, GERD, anxiety, depression, RLS (affects R side more than L)  PRECAUTIONS: None  RED FLAGS: None  WEIGHT BEARING RESTRICTIONS: No  FALLS:  Has patient fallen in last 6 months? Yes. 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 Mild lower lumbar spine facet joint degenerative joint changes  stable.  02/08/22 - B hip x-rays: IMPRESSION: No acute osseous injury of the bilateral hips.  PATIENT SURVEYS:  Modified Oswestry 14 / 50 = 28.0 %  LEFS 54 / 80 = 67.5 %  SCREENING FOR RED FLAGS: Bowel or bladder incontinence: No - but feels like her control is getting weaker Spinal tumors: No Cauda equina syndrome: No Compression fracture: No Abdominal aneurysm: No  COGNITION:  Overall cognitive status: Within functional limits for tasks assessed    SENSATION: Intermittent L>R sciatic pain, numbness & tingling into feet  MUSCLE LENGTH: Hamstrings: mild tight B ITB: mod tight B Piriformis: mod/severe tight B Hip flexors: mild tight B Quads: mild tight B Heelcord: NT  POSTURE:  No Significant postural limitations  PALPATION: Increased muscle tension and tenderness to palpation in bilateral upper and lateral glutes and piriformis  LUMBAR ROM:   Active  eval  Flexion Fingertips to toes - HS tightness  Extension WFL - abdominal and hip flexor tightness  Right lateral flexion WFL - QL tightness  Left lateral flexion WFL - QL tightness  Right rotation WFL  Left rotation WFL  (Blank rows = not tested)  LOWER EXTREMITY ROM:    B LE WFL other than restrictions in B hip IR/ER due to the muscle tightness as above  LOWER EXTREMITY MMT:    MMT Right eval Left eval  Hip flexion 4- 4-  Hip extension 3+ 4  Hip abduction 4- 4+  Hip adduction 4 4+  Hip internal rotation 3+ * 4- *  Hip external rotation 4- 4-  Knee flexion 4 4  Knee extension 4+ 4+  Ankle dorsiflexion 4 4  Ankle plantarflexion 4 (15 SLS HR) 4 (12 SLS HR)  Ankle inversion    Ankle eversion     (Blank rows = not tested; * = pain)  LUMBAR SPECIAL TESTS:  Straight leg raise test: Negative and FABER test: Positive  LOWER EXTREMITY SPECIAL TESTS:  Hip special tests: Luisa Hart (FABER) test: positive  and Ober's test: positive    TODAY'S TREATMENT:   04/29/23 THERAPEUTIC EXERCISE: to improve  flexibility, strength and mobility.  Demonstration, verbal and tactile cues throughout for technique.  NuStep - L4 x 6 min (UE/LE) Hooklying TrA & pelvic floor contraction + RTB alt bent-knee fallout 10 x 3" Hooklying TrA & pelvic floor contraction + RTB hip flexion march 10 x 3" Bridge + RTB hip ABD isometric 10 x 3-5" S/L R RTB clam 10 x 3"  S/L L clam w/o resistance 10 x 3" Hooklying TrA & pelvic floor contraction + hip ADD ball squeeze 10 x 5" Seated L SCM stretch x 30" Seated L/R UT stretches x 30" Seated TrA & pelvic floor contraction with hands pressing into upper thigh 10 x 5"   04/14/23 THERAPEUTIC EXERCISE: to improve flexibility, strength and mobility.  Demonstration, verbal and tactile cues throughout for technique.  Rec Bike - L3 x 4 min  MANUAL THERAPY: To promote normalized muscle tension, improved flexibility, and reduced pain. Skilled palpation and monitoring of soft tissue during DN Trigger Point Dry-Needling  Treatment instructions: Expect mild to moderate muscle soreness. Patient verbalized understanding of these instructions and education. Patient Consent Given: Yes Education handout provided: Previously provided Muscles treated: L iliacus, gluteus maximus/medius/minimus, medial and lateral piriformis Electrical stimulation performed: No Parameters: N/A Treatment response/outcome: Twitch Response Elicited and Palpable Increase in Muscle Length STM/DTM,  manual TPR and pin & stretch to muscles addressed with DN Ktape - Kinesio-Tex GOLD (sensitive skin variety) - L glutes   04/11/23 THERAPEUTIC EXERCISE: to improve flexibility, strength and mobility.  Demonstration, verbal and tactile cues throughout for technique.  Rec Bike - L3 x 6 min Hooklying RTB alt bent-knee fallout 10 x 3" Hooklying TrA + RTB hip flexion march 10 x 3" Bridge + RTB hip ABD isometric 10 x 3-5" S/L clam w/o resistance 10 x 3" bil S/L reverse clam 10 x 3" - L only, deferred on R d/t increased  pain Hooklying figure-4 piriformis stretch with slight overpressure 3 x 10-15" B - better tolerated today  THERAPEUTIC ACTIVITIES: Provided education in proper posture and body mechanics for typical daily positioning and household chores to minimize strain on low back and neck.   PATIENT EDUCATION:  Education details: HEP update - SCM and UT stretches (patient declined handout), postural awareness, Ktape wearing and removal instructions, and pelvic floor muscle activation incorporation into current HEP Person educated: Patient Education method: Explanation and Verbal cues Education comprehension: verbalized understanding, verbal cues required, and needs further education  HOME EXERCISE PROGRAM: Access Code: [email or web address] URL: [email or web address] Date: 04/11/2023 Prepared by: Glenetta Hew  Exercises - Single Knee to Chest Stretch  - 2 x daily - 7 x weekly - 3 reps - 30 sec hold - Supine Piriformis Stretch with Foot on Ground  - 2 x daily - 7 x weekly - 3 reps - 30 sec hold - Seated Table Piriformis Stretch  - 2 x daily - 7 x weekly - 3 reps - 30 sec hold - Supine Iliotibial Band Stretch with Strap  - 2 x daily - 7 x weekly - 3 reps - 30 sec hold - Hooklying Single Leg Bent Knee Fallouts with Resistance  - 1 x daily - 3 x weekly - 2 sets - 10 reps - 3 sec hold - Supine March with Resistance Band  - 1 x daily - 3 x weekly - 2 sets - 10 reps - 2-3 sec hold hold - Bridge with Resistance  - 1 x daily - 3 x weekly - 2 sets - 10 reps - 5 sec hold - Clamshell  - 1 x daily - 3 x weekly - 2 sets - 10 reps - 3 sec hold  Patient Education - Trigger Point Dry Needling - Posture and Body Mechanics   ASSESSMENT:  CLINICAL IMPRESSION: Morrighan returns to PT after 2-week break while traveling and reports she has not really attempted the band resisted exercises while she was traveling, only completing the initially provided exercises. Reviewed more recent strengthening progression,  clarifying proper movement patterns and muscle activation.  She reports her bladder leaking seems to be getting worse since starting PT, therefore discussed how to incorporate pelvic floor/Kegel exercise as she performs the core strengthening exercises.  She reports new/worsening issues with her neck today without known trigger therefore provided instruction in stretching to address complaints of abnormal muscle tension as well as postural correction to help normalize muscle tension and better recruit core musculature.  Will continue to elaborate on postural muscle recruitment as part of core strengthening if patient chooses to continue with PT.  She has 1 visit remaining in her currently approved POC, therefore we will plan for goal assessment next visit to determine readiness to transition to HEP vs need for recert.  OBJECTIVE IMPAIRMENTS: decreased activity tolerance, decreased endurance, decreased knowledge of condition, decreased mobility, difficulty walking, decreased ROM, decreased  strength, hypomobility, increased fascial restrictions, impaired perceived functional ability, increased muscle spasms, impaired flexibility, impaired sensation, improper body mechanics, postural dysfunction, and pain.   ACTIVITY LIMITATIONS: carrying, lifting, bending, sitting, standing, squatting, transfers, bed mobility, toileting, locomotion level, and caring for others  PARTICIPATION LIMITATIONS: meal prep, cleaning, laundry, driving, shopping, and yard work  PERSONAL FACTORS: Age, Fitness, Past/current experiences, Time since onset of injury/illness/exacerbation, and 3+ comorbidities: OA, osteoporosis, HTN, CAD, breast cancer s/p B mastectomies, GERD, anxiety, depression, RLS (affects R side more than L)  are also affecting patient's functional outcome.   REHAB POTENTIAL: Good  CLINICAL DECISION MAKING: Stable/uncomplicated  EVALUATION COMPLEXITY: Low   GOALS: Goals reviewed with patient? Yes  SHORT TERM  GOALS: Target date: 04/12/2023  Patient will be independent with initial HEP to improve outcomes and carryover.  Baseline: Initial HEP provided on eval Goal status: MET  04/11/23   2.  Patient will report centralization of radicular symptoms.  Baseline: Intermittent B LE radicular pain, numbness and tingling to feet Goal status: PARTIALLY MET  04/11/23 - still having feeling of pressure down through leg to ankle but less numbness and tingling  LONG TERM GOALS: Target date: 05/03/2023  Patient will be independent with ongoing/advanced HEP for self-management at home.  Baseline:  Goal status: IN PROGRESS  04/29/23 - limited performance of HEP while traveling, with recently added exercises not attempted  2.  Patient will report 50-75% improvement in low back pain and B hip pain to improve QOL.  Baseline: Intermittent but severe at times Goal status: IN PROGRESS  3.  Patient to demonstrate ability to achieve and maintain good spinal alignment/posturing and body mechanics needed for daily activities. Baseline:  Goal status: IN PROGRESS  4.  Patient will demonstrate full pain free lumbar ROM to perform ADLs.   Baseline: Refer to above lumbar ROM table Goal status: IN PROGRESS  5.  Patient will demonstrate improved B LE strength to >/= 4+/5 for improved stability and ease of mobility . Baseline: Refer to above LE MMT table Goal status: IN PROGRESS  6. Patient will report </= 16% on Modified Oswestry to demonstrate improved functional ability with decreased pain interference. Baseline: 14 / 50 = 28.0 % Goal status: IN PROGRESS  7.  Patient will report >/= 63/80 on LEFS to demonstrate improved functional ability with decreased pain interference. Baseline: 54 / 80 = 67.5 % Goal status: IN PROGRESS   8.  Patient to report ability to perform ADLs, household, and leisure activities without limitation due to low back or B hip pain, LOM or weakness. Baseline:  Goal status: IN  PROGRESS   PLAN:  PT FREQUENCY: 2x/week  PT DURATION: 6 weeks  PLANNED INTERVENTIONS: Therapeutic exercises, Therapeutic activity, Neuromuscular re-education, Balance training, Gait training, Patient/Family education, Self Care, Joint mobilization, Aquatic Therapy, Dry Needling, Electrical stimulation, Spinal manipulation, Spinal mobilization, Cryotherapy, Moist heat, Taping, Traction, Ultrasound, Manual therapy, and Re-evaluation  PLAN FOR NEXT SESSION: Goal assessment - recert vs transition to HEP?; gently progress lumbopelvic flexibility and strengthening - flexion preference?, updating HEP as indicated; MT +/- DN to address abnormal muscle tension in glutes and piriformis; potential further kinesiotaping as benefit noted   Marry Guan, PT 04/29/2023, 12:20 PM

## 2023-05-03 ENCOUNTER — Encounter: Payer: Medicare HMO | Admitting: Physical Therapy

## 2023-05-05 ENCOUNTER — Encounter (HOSPITAL_BASED_OUTPATIENT_CLINIC_OR_DEPARTMENT_OTHER): Payer: Self-pay

## 2023-05-05 DIAGNOSIS — M479 Spondylosis, unspecified: Secondary | ICD-10-CM | POA: Insufficient documentation

## 2023-05-05 DIAGNOSIS — I200 Unstable angina: Secondary | ICD-10-CM | POA: Insufficient documentation

## 2023-05-11 ENCOUNTER — Encounter: Payer: Self-pay | Admitting: Physical Therapy

## 2023-05-11 ENCOUNTER — Ambulatory Visit: Payer: Medicare HMO | Admitting: Physical Therapy

## 2023-05-11 DIAGNOSIS — M62838 Other muscle spasm: Secondary | ICD-10-CM

## 2023-05-11 DIAGNOSIS — M25551 Pain in right hip: Secondary | ICD-10-CM

## 2023-05-11 DIAGNOSIS — M25552 Pain in left hip: Secondary | ICD-10-CM | POA: Diagnosis not present

## 2023-05-11 DIAGNOSIS — M6281 Muscle weakness (generalized): Secondary | ICD-10-CM

## 2023-05-11 DIAGNOSIS — M5416 Radiculopathy, lumbar region: Secondary | ICD-10-CM | POA: Diagnosis not present

## 2023-05-11 DIAGNOSIS — M5459 Other low back pain: Secondary | ICD-10-CM | POA: Diagnosis not present

## 2023-05-11 NOTE — Therapy (Signed)
OUTPATIENT PHYSICAL THERAPY TREATMENT / DISCHARGE SUMMARY  Progress Note  Reporting Period 03/22/2023 to 05/11/2023   See note below for Objective Data and Assessment of Progress/Goals.     Patient Name: [name] MRN: [id number] DOB:12/07/1950, 72 y.o., female Today's Date: 05/11/2023  END OF SESSION:  PT End of Session - 05/11/23 1016     Visit Number 6    Date for PT Re-Evaluation 05/03/23    Authorization Type [employer]    Progress Note Due on Visit 10    PT Start Time 1016    PT Stop Time 1056    PT Time Calculation (min) 40 min    Activity Tolerance Patient tolerated treatment well    Behavior During Therapy WFL for tasks assessed/performed                 Past Medical History:  Diagnosis Date   Allergy    "dust mite residue, mold, mildew, grapefruit, chili peppers, cat dander, some weeds" (03/07/2018)   Anxiety    Arthritis    "minor; right knee, left hip" (03/07/2018)   Bruises easily    Cancer of left breast (HCC)    double mastectomy 01/26/2011   Childhood asthma    Contact lens/glasses fitting    Coronary artery disease    a.  She underwent LHC 7/23 which revealed 95% stenosis of proximal LAD, managed with PCI/DES.    Depression    Essential hypertension    GERD (gastroesophageal reflux disease)    "at times" (03/07/2018)   Heart murmur    Hiatal hernia    High cholesterol    History of kidney stones    "lots; no OR" (03/07/2018)   Osteoporosis    Rectal bleeding    hemorroid   Ringing in ears, bilateral    "since ~ 06/2017" (03/07/2018)   Sinus drainage    Past Surgical History:  Procedure Laterality Date   APPENDECTOMY     BREAST LUMPECTOMY W/ NEEDLE LOCALIZATION Left 10/05/2010   lumpectomy   BREAST SURGERY     CORONARY STENT INTERVENTION N/A 03/07/2018   Procedure: CORONARY STENT INTERVENTION;  Surgeon: Lennette Bihari, MD;  Location: [facility];  Service: Cardiovascular;  Laterality: N/A;   LEFT HEART CATH AND  CORONARY ANGIOGRAPHY N/A 03/07/2018   Procedure: LEFT HEART CATH AND CORONARY ANGIOGRAPHY;  Surgeon: Lennette Bihari, MD;  Location: [facility];  Service: Cardiovascular;  Laterality: N/A;   MASTECTOMY Right 01/26/2011   MASTECTOMY COMPLETE / SIMPLE W/ SENTINEL NODE BIOPSY Left 01/26/2011   OVARIAN CYST SURGERY  1997, 2001, 2008   Patient Active Problem List   Diagnosis Date Noted   Degeneration of spine 05/05/2023   Preinfarction syndrome (HCC) 05/05/2023   Prediabetes 03/09/2023   Lumbar spondylosis 06/17/2021   Degeneration of lumbar intervertebral disc 05/06/2021   Hypertensive disorder 12/26/2019   Osteoporosis 12/26/2019   Hypercholesterolemia 12/26/2019   Dream anxiety disorder 06/13/2018   Anemia 06/13/2018   Depressive disorder 06/13/2018   Fatigue 06/13/2018   RLS (restless legs syndrome) 04/05/2018   Palpitations 03/10/2018   Hyperlipidemia 03/08/2018   Coronary artery disease involving native coronary artery of native heart with unstable angina pectoris (HCC) 03/07/2018   Unstable angina (HCC)    Anxiety state 01/02/2013   Malignant tumor of breast (HCC) 09/08/2010    PCP: Pearline Cables, MD   REFERRING PROVIDER: Pearline Cables, MD   REFERRING DIAG: [phone] (ICD-10-CM) - Bilateral hip pain  (Lower  back and hip pain)  THERAPY DIAG:  Pain of both hip joints  Other low back pain  Radiculopathy, lumbar region  Other muscle spasm  Muscle weakness (generalized)  RATIONALE FOR EVALUATION AND TREATMENT: Rehabilitation  ONSET DATE: >1 yr; exacerbation ~2 weeks ago  NEXT MD VISIT: none scheduled - PRN   SUBJECTIVE:                                                                                                                                                                                                         SUBJECTIVE STATEMENT: Pt reports some "distress" in her lower back during warm-up on bike but states it is not pain, just an  ache.  Biggest issue is that she has been having hot flashes recently and she is "way past menopause". She also notes "distress" in her back when she has to bridge to reposition in bed. She states pain remains mostly concentrated in L posterior/lateral hip and thigh - pain upon initial standing that will resolve once she works through it. She feels like the exercises have helped with her balance and her flexibility but the pain has gotten worse since the start of PT.  EVAL:  Pt reports the majority of her problem is in her L hip - okay when sitting or laying down, but when she goes to get up she notes tremendous pressure in L hip and leg. She reports a h/o a fall ~1 yr ago. She has experienced instances where her back locks up - recent episode after going to get up after being on hands and knees painting baseboards ~2 weeks ago. She does note h/o "tailbone" pain. She uses ice for her back. She also notes cracking in her neck as well as tender places t/o her body. Avoids pain meds because they make her constipated.  She notes she has had more sciatic pain in the last moth than before.  PAIN: Are you having pain? No and Yes: NPRS scale: 0/10 currently, when getting out of bed  in the morning up to 7-8/10 Pain location: L posterior lateral hip and thigh Pain description: "terrible pressure", "squeezing" Aggravating factors: getting out in the morning, getting in/out of car, squatting Relieving factors: ice  PERTINENT HISTORY:  OA, osteoporosis, HTN, CAD, breast cancer s/p B mastectomies, GERD, anxiety, depression, RLS (affects R side more than L)  PRECAUTIONS: None  RED FLAGS: None  WEIGHT BEARING RESTRICTIONS: No  FALLS:  Has patient fallen in last 6 months? Yes. 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 Mild lower lumbar spine facet joint degenerative joint changes stable.  02/08/22 - B hip x-rays: IMPRESSION: No acute osseous injury of the bilateral hips.  PATIENT SURVEYS:  Modified Oswestry 14 / 50 = 28.0 %  LEFS 54 / 80 = 67.5 %  05/11/23: Modified Oswestry: 17 / 50 = 34.0 % LEFS:  53 / 80 = 66.3 %  SCREENING FOR RED FLAGS: Bowel or bladder incontinence: No - but feels like her control is getting weaker Spinal tumors: No Cauda equina syndrome: No Compression fracture: No Abdominal aneurysm: No  COGNITION:  Overall cognitive status: Within functional limits for tasks assessed    SENSATION: Intermittent L>R sciatic pain, numbness & tingling into feet  MUSCLE LENGTH: Hamstrings: mild tight B ITB: mod tight B Piriformis: mod/severe tight B Hip flexors: mild tight B Quads: mild tight B Heelcord: NT  POSTURE:  No Significant postural limitations  PALPATION: Increased muscle tension and tenderness to palpation in bilateral upper and lateral glutes and piriformis  LUMBAR ROM:   Active  eval 05/11/23  Flexion Fingertips to toes - HS tightness WFL - HS tightness  Extension WFL - abdominal and hip flexor tightness WFL - abdominal  tightness  Right lateral flexion WFL - QL tightness WFL - QL tightness - "twinge" in L hip  Left lateral flexion WFL - QL tightness WFL - QL tightness  Right rotation [facility] WFL  Left rotation [facility] WFL  (Blank rows = not tested)  LOWER EXTREMITY ROM:    B LE WFL  other than restrictions in B hip IR/ER due to the muscle tightness as above  LOWER EXTREMITY MMT:    MMT Right eval Left eval R 05/11/23 L 05/11/23  Hip flexion 4- 4- 4 4  Hip extension 3+ 4 4 4   Hip abduction 4- 4+ 4 4  Hip adduction 4 4+ 4 4+  Hip internal rotation 3+ * 4- * 4 * 4+  Hip external rotation 4- 4- 4+ 4+  Knee flexion 4 4 5 5   Knee extension 4+ 4+ 5 5  Ankle dorsiflexion 4 4 4+ 4+  Ankle plantarflexion 4 (15 SLS HR) 4 (12 SLS HR) 4 (13 SLS HR) 4 (13 SLS HR)  Ankle inversion      Ankle eversion       (Blank rows = not tested; * = pain)  LUMBAR SPECIAL TESTS:  Straight leg raise test: Negative and FABER test: Positive  LOWER EXTREMITY SPECIAL TESTS:  Hip special tests: Luisa Hart (FABER) test: positive  and Ober's test: positive    TODAY'S TREATMENT:   05/11/23 THERAPEUTIC EXERCISE: to improve flexibility, strength and mobility.  Demonstration, verbal and tactile cues throughout for technique.  Rec Bike - L3 x 6 min Patient declined HEP review stating she is comfortable with the stretches and does not care to do the strengthening exercises  THERAPEUTIC ACTIVITIES: Modified Oswestry: 17 / 50 = 34.0 %  LEFS:  53 / 80 = 66.3 % Lumbar ROM assessment LE MMT Goal assessment   04/29/23 THERAPEUTIC EXERCISE: to improve flexibility, strength and mobility.  Demonstration, verbal and tactile cues throughout for technique.  NuStep - L4 x 6 min (UE/LE) Hooklying TrA & pelvic floor contraction + RTB alt bent-knee fallout 10 x 3" Hooklying TrA & pelvic floor contraction + RTB hip flexion march 10 x 3" Bridge + RTB hip ABD isometric 10 x 3-5" S/L R RTB clam 10 x 3"  S/L L clam w/o resistance 10 x 3" Hooklying TrA & pelvic floor contraction + hip ADD ball squeeze 10 x 5" Seated L SCM stretch x 30" Seated L/R UT stretches x 30" Seated TrA & pelvic floor contraction with hands pressing into upper thigh 10 x 5"   04/14/23 THERAPEUTIC EXERCISE: to improve flexibility, strength  and mobility.  Demonstration, verbal and tactile cues throughout for technique.  Rec Bike - L3 x 4 min  MANUAL THERAPY: To promote normalized muscle tension, improved flexibility, and reduced pain. Skilled palpation and monitoring of soft tissue during DN Trigger Point Dry-Needling  Treatment instructions: Expect mild to moderate muscle soreness. Patient verbalized understanding of these instructions and education. Patient Consent Given: Yes Education handout provided: Previously provided Muscles treated: L iliacus, gluteus maximus/medius/minimus, medial and lateral piriformis Electrical stimulation performed: No Parameters: N/A Treatment response/outcome: Twitch Response Elicited and Palpable Increase in Muscle Length STM/DTM, manual TPR and pin & stretch to muscles addressed with DN Ktape - Kinesio-Tex GOLD (sensitive skin variety) - L glutes   PATIENT EDUCATION:  Education details:  Need for balance between stretching and strengthening exercises to achieve desired outcome from PT Person educated: Patient Education method: Explanation Education comprehension: verbalized understanding  HOME EXERCISE PROGRAM: Access Code: [email or web address] URL: [email or web address] Date: 04/11/2023 Prepared by: Glenetta Hew  Exercises - Single Knee to Chest Stretch  - 2 x daily - 7 x weekly - 3 reps - 30 sec hold - Supine Piriformis Stretch with Foot on Ground  - 2 x daily - 7 x weekly - 3 reps - 30 sec hold - Seated Table Piriformis Stretch  - 2 x daily - 7 x weekly - 3 reps - 30 sec hold - Supine Iliotibial Band Stretch with Strap  - 2 x daily - 7 x weekly - 3 reps - 30 sec hold - Hooklying Single Leg Bent Knee Fallouts with Resistance  - 1 x daily - 3 x weekly - 2 sets - 10 reps - 3 sec hold - Supine March with Resistance Band  - 1 x daily - 3 x weekly - 2 sets - 10 reps - 2-3 sec hold hold - Bridge with Resistance  - 1 x daily - 3 x weekly - 2 sets - 10 reps - 5 sec hold - Clamshell  -  1 x daily - 3 x weekly - 2 sets - 10 reps - 3 sec hold  Patient Education - Trigger Point Dry Needling - Posture and Body Mechanics   ASSESSMENT:  CLINICAL IMPRESSION: Afi continues to report selective compliance with HEP, stating only performing stretching exercises and not completing any of the strengthening exercises as she does not like these exercises.  She notes improved flexibility and is able to demonstrate full lumbar AROM, however due to poor compliance with strengthening program, she continues to lack core and proximal LE muscle strength to provide stability, hence she continues to experience pain during transitional movements as well as notes recent worsening  of bladder control.  She feels like she will do better with strengthening if she works with a Systems analyst at [employer], therefore will proceed with discharge from physical therapy with patient planning to proceed with gym based exercise.  PT goals only partially met.  OBJECTIVE IMPAIRMENTS: decreased activity tolerance, decreased endurance, decreased knowledge of condition, decreased mobility, difficulty walking, decreased ROM, decreased strength, hypomobility, increased fascial restrictions, impaired perceived functional ability, increased muscle spasms, impaired flexibility, impaired sensation, improper body mechanics, postural dysfunction, and pain.   ACTIVITY LIMITATIONS: carrying, lifting, bending, sitting, standing, squatting, transfers, bed mobility, toileting, locomotion level, and caring for others  PARTICIPATION LIMITATIONS: meal prep, cleaning, laundry, driving, shopping, and yard work  PERSONAL FACTORS: Age, Fitness, Past/current experiences, Time since onset of injury/illness/exacerbation, and 3+ comorbidities: OA, osteoporosis, HTN, CAD, breast cancer s/p B mastectomies, GERD, anxiety, depression, RLS (affects R side more than L)  are also affecting patient's functional outcome.   REHAB POTENTIAL:  Good  CLINICAL DECISION MAKING: Stable/uncomplicated  EVALUATION COMPLEXITY: Low   GOALS: Goals reviewed with patient? Yes  SHORT TERM GOALS: Target date: 04/12/2023  Patient will be independent with initial HEP to improve outcomes and carryover.  Baseline: Initial HEP provided on eval Goal status: MET  04/11/23   2.  Patient will report centralization of radicular symptoms.  Baseline: Intermittent B LE radicular pain, numbness and tingling to feet Goal status: PARTIALLY MET  04/11/23 - still having feeling of pressure down through leg to ankle but less numbness and tingling  LONG TERM GOALS: Target date: 05/03/2023  Patient will be independent with ongoing/advanced HEP for self-management at home.  Baseline:  Goal status: PARTIALLY MET  05/11/23 - selective performance of HEP only performing stretches but not doing any strengthening exercises  2.  Patient will report 50-75% improvement in low back pain and B hip pain to improve QOL.  Baseline: Intermittent but severe at times Goal status: NOT MET  05/11/23 - patient reports pain unchanged  3.  Patient to demonstrate ability to achieve and maintain good spinal alignment/posturing and body mechanics needed for daily activities. Baseline:  Goal status: MET  05/11/23 - education provided for proper posture and body mechanics with daily activities - patient denies any questions or concerns  4.  Patient will demonstrate full pain free lumbar ROM to perform ADLs.   Baseline: Refer to above lumbar ROM table Goal status: PARTIALLY MET  05/11/23 - lumbar ROM WFL/WNL w/o pain except slight twinge  5.  Patient will demonstrate improved B LE strength to >/= 4+/5 for improved stability and ease of mobility . Baseline: Refer to above LE MMT table Goal status: PARTIALLY MET  05/11/23 - met for knees and ankles, however hip weakness remains bilaterally  6. Patient will report </= 16% on Modified Oswestry to demonstrate improved functional ability  with decreased pain interference. Baseline: 14 / 50 = 28.0 % Goal status: NOT MET  05/11/23 - 17 / 50 = 34.0 %  7.  Patient will report >/= 63/80 on LEFS to demonstrate improved functional ability with decreased pain interference. Baseline: 54 / 80 = 67.5 % Goal status: NOT MET  05/11/23 - 53 / 80 = 66.3 %  8.  Patient to report ability to perform ADLs, household, and leisure activities without limitation due to low back or B hip pain, LOM or weakness. Baseline:  Goal status: NOT MET  05/11/23 - patient reports continued pain with transitional motions especially getting out of bed   PLAN:  PT FREQUENCY: 2x/week  PT DURATION: 6 weeks  PLANNED INTERVENTIONS: Therapeutic exercises, Therapeutic activity, Neuromuscular re-education, Balance training, Gait training, Patient/Family education, Self Care, Joint mobilization, Aquatic Therapy, Dry Needling, Electrical stimulation, Spinal manipulation, Spinal mobilization, Cryotherapy, Moist heat, Taping, Traction, Ultrasound, Manual therapy, and Re-evaluation  PLAN FOR NEXT SESSION: transition to HEP with pt planning to work with a trainer at [employer], D/C from PT  PHYSICAL THERAPY DISCHARGE SUMMARY  Visits from Start of Care: 6  Current functional level related to goals / functional outcomes: Refer to above clinical impression and goal assessment.   Remaining deficits: As above.   Education / Equipment: HEP, Medical illustrator.  Patient agrees to discharge. Patient goals were partially met. Patient is being discharged due to the patient's request.  Marry Guan, PT 05/11/2023, 1:30 PM

## 2023-05-13 ENCOUNTER — Ambulatory Visit: Payer: Medicare HMO | Attending: Student | Admitting: Student

## 2023-05-13 ENCOUNTER — Encounter: Payer: Self-pay | Admitting: Student

## 2023-05-13 VITALS — BP 116/58 | HR 52 | Ht 62.5 in | Wt 149.0 lb

## 2023-05-13 DIAGNOSIS — I25118 Atherosclerotic heart disease of native coronary artery with other forms of angina pectoris: Secondary | ICD-10-CM | POA: Diagnosis not present

## 2023-05-13 DIAGNOSIS — I10 Essential (primary) hypertension: Secondary | ICD-10-CM

## 2023-05-13 DIAGNOSIS — E785 Hyperlipidemia, unspecified: Secondary | ICD-10-CM | POA: Diagnosis not present

## 2023-05-13 DIAGNOSIS — R002 Palpitations: Secondary | ICD-10-CM | POA: Diagnosis not present

## 2023-05-13 DIAGNOSIS — I209 Angina pectoris, unspecified: Secondary | ICD-10-CM

## 2023-05-13 NOTE — Addendum Note (Signed)
Addended by: Shon Hale on: 05/13/2023 03:22 PM   Modules accepted: Orders

## 2023-05-13 NOTE — Patient Instructions (Addendum)
Medication Instructions:  NO CHANGES *If you need a refill on your cardiac medications before your next appointment, please call your pharmacy*   Lab Work: BMET, CBC If you have labs (blood work) drawn today and your tests are completely normal, you will receive your results only by: MyChart Message (if you have MyChart) OR A paper copy in the mail If you have any lab test that is abnormal or we need to change your treatment, we will call you to review the results.   Testing/Procedures:   How to Prepare for Your Cardiac PET/CT Stress Test:  1. Please do not take these medications before your test:   Medications that may interfere with the cardiac pharmacological stress agent (ex. nitrates - including erectile dysfunction medications, isosorbide mononitrate, tamulosin or beta-blockers) the day of the exam.  Dipyridamole 48 hours prior to the test. Your remaining medications may be taken with water.  2. Nothing to eat or drink, except water, 3 hours prior to arrival time.   NO caffeine/decaffeinated products, or chocolate 12 hours prior to arrival.  3. NO perfume, cologne or lotion on chest or abdomen area.          - FEMALES - Please avoid wearing dresses to this appointment.  4. Total time is 1 to 2 hours; you may want to bring reading material for the waiting time.  5. Please report to Radiology at the [facility] Main Entrance 30 minutes early for your test.  [address] [place], [place] [address]  6. Please report to Radiology at [facility] Main Entrance, medical mall, 30 mins prior to your test.  [address]  [place], [place]  [phone]  Diabetic Preparation:  Hold oral medications. You may take NPH and Lantus insulin. Do not take Humalog or Humulin R (Regular Insulin) the day of your test. Check blood sugars prior to leaving the house. If able to eat breakfast prior to 3 hour fasting, you may take all medications,  including your insulin, Do not worry if you miss your breakfast dose of insulin - start at your next meal. Patients who wear a continuous glucose monitor MUST remove the device prior to scanning.    In preparation for your appointment, medication and supplies will be purchased.  Appointment availability is limited, so if you need to cancel or reschedule, please call the Radiology Department at [phone] [facility]) OR [phone] [facility])  24 hours in advance to avoid a cancellation fee of $100.00  What to Expect After you Arrive:  Once you arrive and check in for your appointment, you will be taken to a preparation room within the Radiology Department.  A technologist or Nurse will obtain your medical history, verify that you are correctly prepped for the exam, and explain the procedure.  Afterwards,  an IV will be started in your arm and electrodes will be placed on your skin for EKG monitoring during the stress portion of the exam. Then you will be escorted to the PET/CT scanner.  There, staff will get you positioned on the scanner and obtain a blood pressure and EKG.  During the exam, you will continue to be connected to the EKG and blood pressure machines.  A small, safe amount of a radioactive tracer will be injected in your IV to obtain a series of pictures of your heart along with an injection of a stress agent.    After your Exam:  It is recommended that you eat a  meal and drink a caffeinated beverage to counter act any effects of the stress agent.  Drink plenty of fluids for the remainder of the day and urinate frequently for the first couple of hours after the exam.  Your doctor will inform you of your test results within 7-10 business days.  For more information and frequently asked questions, please visit our website : [email or web address]  For questions about your test or how to prepare for your test, please call: Cardiac Imaging Nurse Navigators Office:  [phone]    Follow-Up: At [facility], you and your health needs are our priority.  As part of our continuing mission to provide you with exceptional heart care, we have created designated Provider Care Teams.  These Care Teams include your primary Cardiologist (physician) and Advanced Practice Providers (APPs -  Physician Assistants and Nurse Practitioners) who all work together to provide you with the care you need, when you need it.  We recommend signing up for the patient portal called "MyChart".  Sign up information is provided on this After Visit Summary.  MyChart is used to connect with patients for Virtual Visits (Telemedicine).  Patients are able to view lab/test results, encounter notes, upcoming appointments, etc.  Non-urgent messages can be sent to your provider as well.   To learn more about what you can do with MyChart, go to [email or web address].    Your next appointment:   3 month(s)  Provider:   Nicki Guadalajara, MD

## 2023-05-24 ENCOUNTER — Other Ambulatory Visit: Payer: Self-pay | Admitting: Cardiovascular Disease

## 2023-05-24 DIAGNOSIS — I10 Essential (primary) hypertension: Secondary | ICD-10-CM

## 2023-05-26 ENCOUNTER — Encounter (HOSPITAL_COMMUNITY): Payer: Self-pay

## 2023-05-30 ENCOUNTER — Telehealth (HOSPITAL_COMMUNITY): Payer: Self-pay | Admitting: *Deleted

## 2023-05-30 NOTE — Telephone Encounter (Signed)
Reaching out to patient to offer assistance regarding upcoming cardiac imaging study; pt verbalizes understanding of appt date/time, parking situation and where to check in, pre-test NPO status  and verified current allergies; name and call back number provided for further questions should they arise  Ellen Brick RN Navigator Cardiac Imaging [facility] [phone] office [phone] cell  Patient aware to avoid caffeine 12 hours prior to her cardiac PET scan.

## 2023-05-31 ENCOUNTER — Ambulatory Visit (HOSPITAL_COMMUNITY)
Admission: RE | Admit: 2023-05-31 | Discharge: 2023-05-31 | Disposition: A | Discharge status: Home / Self Care | Payer: Medicare HMO | Source: Ambulatory Visit | Attending: Student | Admitting: Student

## 2023-05-31 DIAGNOSIS — I700 Atherosclerosis of aorta: Secondary | ICD-10-CM | POA: Diagnosis not present

## 2023-05-31 DIAGNOSIS — I209 Angina pectoris, unspecified: Secondary | ICD-10-CM | POA: Insufficient documentation

## 2023-05-31 LAB — NM PET CT CARDIAC PERFUSION MULTI W/ABSOLUTE BLOODFLOW
LV dias vol: 73 mL (ref 46–106)
LV sys vol: 21 mL
MBFR: 3.06
Rest MBF: 0.88 ml/g/min
Rest Nuclear Isotope Dose: 17.3 mCi
ST Depression (mm): 0 mm
Stress MBF: 2.69 ml/g/min
Stress Nuclear Isotope Dose: 17.7 mCi
TID: 0.92

## 2023-05-31 MED ORDER — REGADENOSON 0.4 MG/5ML IV SOLN
0.4000 mg | Freq: Once | INTRAVENOUS | Status: AC
  Administered 2023-05-31: 0.4 mg via INTRAVENOUS

## 2023-05-31 MED ORDER — REGADENOSON 0.4 MG/5ML IV SOLN
INTRAVENOUS | Status: AC
  Filled 2023-05-31: qty 5

## 2023-05-31 MED ORDER — RUBIDIUM RB82 GENERATOR (RUBYFILL)
17.3400 | PACK | Freq: Once | INTRAVENOUS | Status: AC
  Administered 2023-05-31: 17.69 via INTRAVENOUS

## 2023-05-31 MED ORDER — RUBIDIUM RB82 GENERATOR (RUBYFILL)
17.3000 | PACK | Freq: Once | INTRAVENOUS | Status: AC
  Administered 2023-05-31: 17.34 via INTRAVENOUS

## 2023-05-31 NOTE — Progress Notes (Signed)
Tolerated test well.  Did have slight nausea and mild headache

## 2023-06-02 DIAGNOSIS — H1045 Other chronic allergic conjunctivitis: Secondary | ICD-10-CM | POA: Diagnosis not present

## 2023-06-02 DIAGNOSIS — H43393 Other vitreous opacities, bilateral: Secondary | ICD-10-CM | POA: Diagnosis not present

## 2023-06-02 DIAGNOSIS — H2513 Age-related nuclear cataract, bilateral: Secondary | ICD-10-CM | POA: Diagnosis not present

## 2023-06-02 DIAGNOSIS — H0102B Squamous blepharitis left eye, upper and lower eyelids: Secondary | ICD-10-CM | POA: Diagnosis not present

## 2023-06-02 DIAGNOSIS — H0102A Squamous blepharitis right eye, upper and lower eyelids: Secondary | ICD-10-CM | POA: Diagnosis not present

## 2023-06-02 DIAGNOSIS — H04123 Dry eye syndrome of bilateral lacrimal glands: Secondary | ICD-10-CM | POA: Diagnosis not present

## 2023-06-02 DIAGNOSIS — H5000 Unspecified esotropia: Secondary | ICD-10-CM | POA: Diagnosis not present

## 2023-06-03 ENCOUNTER — Telehealth: Payer: Self-pay | Admitting: Cardiovascular Disease

## 2023-06-03 NOTE — Telephone Encounter (Signed)
Spoke with pt, aware PET scan was normal.

## 2023-06-03 NOTE — Telephone Encounter (Signed)
Called patient. Left message to return call.

## 2023-06-03 NOTE — Telephone Encounter (Signed)
Patient was returning phone call regarding her CT results

## 2023-07-16 ENCOUNTER — Encounter: Payer: Self-pay | Admitting: *Deleted

## 2023-07-16 ENCOUNTER — Other Ambulatory Visit: Payer: Self-pay | Admitting: Family Medicine

## 2023-07-16 ENCOUNTER — Other Ambulatory Visit: Payer: Self-pay | Admitting: Cardiovascular Disease

## 2023-07-16 DIAGNOSIS — I25118 Atherosclerotic heart disease of native coronary artery with other forms of angina pectoris: Secondary | ICD-10-CM

## 2023-07-16 DIAGNOSIS — R002 Palpitations: Secondary | ICD-10-CM

## 2023-07-16 DIAGNOSIS — G4700 Insomnia, unspecified: Secondary | ICD-10-CM

## 2023-08-03 ENCOUNTER — Ambulatory Visit: Payer: Medicare HMO | Admitting: Cardiovascular Disease

## 2023-08-15 ENCOUNTER — Other Ambulatory Visit: Payer: Self-pay | Admitting: Cardiovascular Disease

## 2023-08-15 DIAGNOSIS — I10 Essential (primary) hypertension: Secondary | ICD-10-CM

## 2023-08-15 DIAGNOSIS — H524 Presbyopia: Secondary | ICD-10-CM | POA: Diagnosis not present

## 2023-08-15 DIAGNOSIS — Z0100 Encounter for examination of eyes and vision without abnormal findings: Secondary | ICD-10-CM | POA: Diagnosis not present

## 2023-08-17 ENCOUNTER — Other Ambulatory Visit: Payer: Self-pay | Admitting: Cardiovascular Disease

## 2023-08-17 DIAGNOSIS — E785 Hyperlipidemia, unspecified: Secondary | ICD-10-CM

## 2023-08-18 NOTE — Telephone Encounter (Signed)
 Please contact pt for future appointment. Pt due for 3 month f/u with Dr. Tresa Endo.

## 2023-08-19 ENCOUNTER — Telehealth: Payer: Self-pay | Admitting: Cardiovascular Disease

## 2023-08-19 DIAGNOSIS — E785 Hyperlipidemia, unspecified: Secondary | ICD-10-CM

## 2023-08-19 MED ORDER — ATORVASTATIN CALCIUM 80 MG PO TABS: 80.0000 mg | ORAL_TABLET | Freq: Every day | ORAL | 0 refills | Status: DC

## 2023-08-19 NOTE — Telephone Encounter (Signed)
*  STAT* If patient is at the pharmacy, call can be transferred to refill team.   1. Which medications need to be refilled? (please list name of each medication and dose if known)  atorvastatin  (LIPITOR ) 80 MG tablet    4. Which pharmacy/location (including street and city if local pharmacy) is medication to be sent to? [id number] [place], [place] - [address] Phone: [phone]  Fax: [phone]       5. Do they need a 30 day or 90 day supply? 14   Pt is out and mailorder will take a few days, she is requesting 14 day sent to local pharmacy

## 2023-09-30 ENCOUNTER — Telehealth: Payer: Self-pay | Admitting: Cardiovascular Disease

## 2023-09-30 DIAGNOSIS — I25118 Atherosclerotic heart disease of native coronary artery with other forms of angina pectoris: Secondary | ICD-10-CM

## 2023-09-30 DIAGNOSIS — R002 Palpitations: Secondary | ICD-10-CM

## 2023-09-30 DIAGNOSIS — E785 Hyperlipidemia, unspecified: Secondary | ICD-10-CM

## 2023-09-30 DIAGNOSIS — I10 Essential (primary) hypertension: Secondary | ICD-10-CM

## 2023-09-30 NOTE — Telephone Encounter (Signed)
*  STAT* If patient is at the pharmacy, call can be transferred to refill team.   1. Which medications need to be refilled? (please list name of each medication and dose if known) amLODipine (NORVASC) 5 MG tablet ; atorvastatin (LIPITOR) 80 MG tablet ; clopidogrel (PLAVIX) 75 MG tablet ; metoprolol succinate (TOPROL-XL) 25 MG 24 hr tablet    2. Would you like to learn more about the convenience, safety, & potential cost savings by using the [facility]?     3. Are you open to using the [facility] (Type [facility]. ).   4. Which pharmacy/location (including street and city if local pharmacy) is medication to be sent to? [id number] [place], [place] - [address]    5. Do they need a 30 day or 90 day supply? 2 day supply (patient out of town for 2 days)

## 2023-10-03 MED ORDER — ATORVASTATIN CALCIUM 80 MG PO TABS: 80.0000 mg | ORAL_TABLET | Freq: Every day | ORAL | 2 refills | Status: DC

## 2023-10-03 MED ORDER — METOPROLOL SUCCINATE ER 25 MG PO TB24: ORAL_TABLET | ORAL | 2 refills | Status: DC

## 2023-10-03 MED ORDER — AMLODIPINE BESYLATE 5 MG PO TABS: 5.0000 mg | ORAL_TABLET | Freq: Every day | ORAL | 2 refills | Status: DC

## 2023-10-03 MED ORDER — CLOPIDOGREL BISULFATE 75 MG PO TABS: 75.0000 mg | ORAL_TABLET | Freq: Every day | ORAL | 0 refills | Status: DC

## 2023-10-07 ENCOUNTER — Other Ambulatory Visit: Payer: Self-pay | Admitting: Student

## 2023-10-07 DIAGNOSIS — I25118 Atherosclerotic heart disease of native coronary artery with other forms of angina pectoris: Secondary | ICD-10-CM

## 2023-10-07 DIAGNOSIS — R002 Palpitations: Secondary | ICD-10-CM

## 2023-10-12 ENCOUNTER — Telehealth: Payer: Self-pay

## 2023-10-12 DIAGNOSIS — Z7901 Long term (current) use of anticoagulants: Secondary | ICD-10-CM | POA: Diagnosis not present

## 2023-10-12 DIAGNOSIS — I2510 Atherosclerotic heart disease of native coronary artery without angina pectoris: Secondary | ICD-10-CM | POA: Diagnosis not present

## 2023-10-12 DIAGNOSIS — Z860101 Personal history of adenomatous and serrated colon polyps: Secondary | ICD-10-CM | POA: Diagnosis not present

## 2023-10-12 DIAGNOSIS — K219 Gastro-esophageal reflux disease without esophagitis: Secondary | ICD-10-CM | POA: Diagnosis not present

## 2023-10-12 NOTE — Telephone Encounter (Signed)
   Name: [name]  DOB: 1950/09/08  MRN: [id number]  Primary Cardiologist: Nicki Guadalajara, MD  Chart reviewed as part of pre-operative protocol coverage. Because of [name]'s past medical history and time since last visit, she will require a follow-up in-office visit in order to better assess preoperative cardiovascular risk. Pt is overdue for follow-up.   Pre-op covering staff: - Please schedule appointment and call patient to inform them. If patient already had an upcoming appointment within acceptable timeframe, please add "pre-op clearance" to the appointment notes so provider is aware. - Please contact requesting surgeon's office via preferred method (i.e, phone, fax) to inform them of need for appointment prior to surgery.  Joylene Grapes, NP  10/12/2023, 4:16 PM

## 2023-10-12 NOTE — Telephone Encounter (Signed)
   Pre-operative Risk Assessment    Patient Name: [name]  DOB: 26-Sep-1950 MRN: [id number]   Date of last office visit: 05/12/24 D. Wittenborn, NP Date of next office visit: NA   Request for Surgical Clearance    Procedure:   Colonoscopy (Patient has a Hx of colon polyps)  Date of Surgery:  Clearance 11/02/23                                 Surgeon:  Dr. Vida Rigger Surgeon's Group or Practice Name:  [facility] Phone number:  [phone] Fax number:  [phone]   Type of Clearance Requested:   - Medical  - Pharmacy:  Hold Aspirin and Clopidogrel (Plavix)     Type of Anesthesia:   Propofol   Additional requests/questions:    [name]   10/12/2023, 3:57 PM

## 2023-10-12 NOTE — Telephone Encounter (Signed)
 Pt has been scheduled to see Azalee Course, [facility] 10/26/23 for preop clearance.   Pt also would like to know who Dr. Tresa Endo is going to have as her new cardiologist. I told pt that will be a question she can ask while at appt 10/26/23. Pt also asked if she needs any testing can it be done that day. I stated I cannot answer that as to I am not sure what he may order.

## 2023-10-16 NOTE — Progress Notes (Addendum)
 [facility] [address] [place], [place] [address] [phone]  Date:  10/20/2023   Name:  [name]   DOB:  1951-04-25   MRN:  [id number]  PCP:  Pearline Cables, MD    Chief Complaint: liver function (Concerns/ questions: 1. Review s/e of some of her meds. 2. Have LFT collected today. 3. Discuss urinary concerns.)   History of Present Illness:  [name] is a 73 y.o. very pleasant female patient who presents with the following:  Patient seen today to follow-up on her liver and kidney function Most recent visit with myself was in July- history of CAD s/p PCI, hyperlipidemia, angina, breast cancer, osteoporosis  At that time we note her bilirubin was 2.2, ALT minimally elevated at 41.  Alk phos and AST normal  Looking back her bilirubin has been elevated on several occasions and we did do a fractionated bilirubin back in 2022 We can do a reticulocyte count and blood smear to complete evaluation but suspect she has benign Sullivan Lone syndrome  Second dose Shingrix- done per pt report  Flu shot- done  Recommend COVID booster if not up-to-date  She was worried about her kidney function- she has noted her urine looking dark recently She also wet the bed twice recently- she thinks this is due to a recurrent dream she has where she is going to urinate but did not manage to wake up this time.  She has not noted any pain with urination or blood in her urine  She is concerned because she read the colonoscopy prep can be hard on kidneys and she plans to have a colonoscopy soon  Patient Active Problem List   Diagnosis Date Noted   Degeneration of spine 05/05/2023   Preinfarction syndrome (HCC) 05/05/2023   Prediabetes 03/09/2023   Lumbar spondylosis 06/17/2021   Degeneration of lumbar intervertebral disc 05/06/2021   Hypertensive disorder 12/26/2019   Osteoporosis 12/26/2019   Hypercholesterolemia 12/26/2019    Dream anxiety disorder 06/13/2018   Anemia 06/13/2018   Depressive disorder 06/13/2018   Fatigue 06/13/2018   RLS (restless legs syndrome) 04/05/2018   Palpitations 03/10/2018   Hyperlipidemia 03/08/2018   Coronary artery disease involving native coronary artery of native heart with unstable angina pectoris (HCC) 03/07/2018   Unstable angina (HCC)    Anxiety state 01/02/2013   Malignant tumor of breast (HCC) 09/08/2010    Past Medical History:  Diagnosis Date   Allergy    "dust mite residue, mold, mildew, grapefruit, chili peppers, cat dander, some weeds" (03/07/2018)   Anxiety    Arthritis    "minor; right knee, left hip" (03/07/2018)   Bruises easily    Cancer of left breast (HCC)    double mastectomy 01/26/2011   Childhood asthma    Contact lens/glasses fitting    Coronary artery disease    a.  She underwent LHC 7/23 which revealed 95% stenosis of proximal LAD, managed with PCI/DES.    Depression    Essential hypertension    GERD (gastroesophageal reflux disease)    "at times" (03/07/2018)   Heart murmur    Hiatal hernia    High cholesterol    History of kidney stones    "lots; no OR" (03/07/2018)   Osteoporosis    Rectal bleeding    hemorroid   Ringing in ears, bilateral    "since ~ 06/2017" (03/07/2018)   Sinus drainage     Past  Surgical History:  Procedure Laterality Date   APPENDECTOMY     BREAST LUMPECTOMY W/ NEEDLE LOCALIZATION Left 10/05/2010   lumpectomy   BREAST SURGERY     CORONARY STENT INTERVENTION N/A 03/07/2018   Procedure: CORONARY STENT INTERVENTION;  Surgeon: Lennette Bihari, MD;  Location: [facility];  Service: Cardiovascular;  Laterality: N/A;   LEFT HEART CATH AND CORONARY ANGIOGRAPHY N/A 03/07/2018   Procedure: LEFT HEART CATH AND CORONARY ANGIOGRAPHY;  Surgeon: Lennette Bihari, MD;  Location: [facility];  Service: Cardiovascular;  Laterality: N/A;   MASTECTOMY Right 01/26/2011   MASTECTOMY COMPLETE / SIMPLE W/ SENTINEL NODE  BIOPSY Left 01/26/2011   OVARIAN CYST SURGERY  1997, 2001, 2008    Social History   Tobacco Use   Smoking status: Never   Smokeless tobacco: Never  Vaping Use   Vaping status: Never Used  Substance Use Topics   Alcohol use: Yes    Comment: 03/07/2018 "probably once/month"   Drug use: No    Family History  Problem Relation Age of Onset   Hypertension Mother    Heart Problems Mother    Hyperlipidemia Mother    Hypotension Father    Other Father        pacemaker   Heart failure Paternal Grandmother    Heart failure Paternal Grandfather    Cancer Brother    Cancer Sister    Diabetes Sister     Allergies  Allergen Reactions   Other     Grapefruit   Elavil [Amitriptyline Hcl] Rash   Elemental Sulfur Rash    Medication list has been reviewed and updated.  Current Outpatient Medications on File Prior to Visit  Medication Sig Dispense Refill   albuterol (VENTOLIN HFA) 108 (90 Base) MCG/ACT inhaler Inhale 1-2 puffs into the lungs every 4 (four) hours as needed for wheezing or shortness of breath. 18 g 6   amLODipine (NORVASC) 5 MG tablet Take 1 tablet (5 mg total) by mouth daily. 90 tablet 2   Ascorbic Acid (VITAMIN C) 1000 MG tablet Take 1,000 mg by mouth daily.     aspirin EC 81 MG tablet Take 1 tablet (81 mg total) by mouth daily. 90 tablet 3   atorvastatin (LIPITOR) 80 MG tablet Take 1 tablet (80 mg total) by mouth daily. (Patient taking differently: Take 80 mg by mouth daily. Taking 1/2 tab daily.) 90 tablet 2   cetirizine (ZYRTEC) 10 MG tablet Take 10 mg by mouth daily as needed for allergies.      cholecalciferol (VITAMIN D3) 25 MCG (1000 UNIT) tablet Take 1,000 Units by mouth daily.     clopidogrel (PLAVIX) 75 MG tablet Take 1 tablet (75 mg total) by mouth daily. 90 tablet 2   famotidine (PEPCID) 40 MG tablet 1 tablet Orally Once a day for 30 days     FLUoxetine (PROZAC) 20 MG capsule Take 1 capsule (20 mg total) by mouth daily. Needs appt 90 capsule 3   fluticasone  (FLONASE) 50 MCG/ACT nasal spray USE 2 SPRAYS IN EACH       NOSTRIL DAILY AS NEEDED 48 g 1   metoprolol succinate (TOPROL-XL) 25 MG 24 hr tablet Take 0.5 tablets (12.5 mg total) by mouth daily. 45 tablet 2   montelukast (SINGULAIR) 10 MG tablet Take 1 tablet (10 mg total) by mouth at bedtime. 90 tablet 3   Multiple Vitamins-Minerals (MULTIVITAMIN GUMMIES ADULT PO) Take by mouth.     nitroGLYCERIN (NITROSTAT) 0.4 MG SL tablet  Place 1 tablet (0.4 mg total) under the tongue every 5 (five) minutes as needed for chest pain. 25 tablet 2   UNABLE TO FIND Med Name: CBD Gummy     No current facility-administered medications on file prior to visit.    Review of Systems:  As per HPI- otherwise negative.   Physical Examination: Vitals:   10/20/23 1407  BP: 110/62  Pulse: 66  Resp: 18  Temp: 98.1 F (36.7 C)  SpO2: 97%   Vitals:   10/20/23 1407  Weight: 149 lb 12.8 oz (67.9 kg)  Height: 5\' 3"  (1.6 m)   Body mass index is 26.54 kg/m. Ideal Body Weight: Weight in (lb) to have BMI = 25: 140.8  GEN: no acute distress. Normal weight, looks well  HEENT: Atraumatic, Normocephalic.  Ears and Nose: No external deformity. CV: RRR, No M/G/R. No JVD. No thrill. No extra heart sounds. PULM: CTA B, no wheezes, crackles, rhonchi. No retractions. No resp. distress. No accessory muscle use. ABD: S, NT, ND, +BS. No rebound. No HSM.  Belly is benign EXTR: No c/c/e PSYCH: Normally interactive. Conversant.   Results for orders placed or performed in visit on 10/20/23  POCT urinalysis dipstick   Collection Time: 10/20/23  2:33 PM  Result Value Ref Range   Color, UA yellow yellow   Clarity, UA cloudy (A) clear   Glucose, UA negative negative mg/dL   Bilirubin, UA negative negative   Ketones, POC UA negative negative mg/dL   Spec Grav, UA <=1.610 (A) 1.010 - 1.025   Blood, UA negative negative   pH, UA 6.0 5.0 - 8.0   Protein Ur, POC negative negative mg/dL   Urobilinogen, UA 0.2 0.2 or 1.0  E.U./dL   Nitrite, UA Negative Negative   Leukocytes, UA Trace (A) Negative    Assessment and Plan: Elevated bilirubin - Plan: CBC with Differential/Platelet, Comprehensive metabolic panel, Reticulocyte, Hemoglobin Panel  Abnormal urine odor - Plan: Urine Culture, POCT urinalysis dipstick  Transaminitis  Patient seen today for follow-up.  She is concerned about elevated bilirubin and minimal transaminitis previously, we will follow-up on these today.  I will obtain a reticulocyte count and her CBC will be sent for peripheral smear if appropriate  Reassured her that her kidney function has always looked fine.  She may have a urinary tract infection as she has noted some urine odor and had these to episodes of bedwetting associated with dreaming.  Offered to start her antibiotics now but she would prefer to see what her culture shows [facility] Signed Abbe Amsterdam, MD  Received her labs as below 3/7- message to pt  Results for orders placed or performed in visit on 10/20/23  POCT urinalysis dipstick   Collection Time: 10/20/23  2:33 PM  Result Value Ref Range   Color, UA yellow yellow   Clarity, UA cloudy (A) clear   Glucose, UA negative negative mg/dL   Bilirubin, UA negative negative   Ketones, POC UA negative negative mg/dL   Spec Grav, UA <=9.604 (A) 1.010 - 1.025   Blood, UA negative negative   pH, UA 6.0 5.0 - 8.0   Protein Ur, POC negative negative mg/dL   Urobilinogen, UA 0.2 0.2 or 1.0 E.U./dL   Nitrite, UA Negative Negative   Leukocytes, UA Trace (A) Negative  CBC with Differential/Platelet   Collection Time: 10/20/23  2:43 PM  Result Value Ref Range   WBC 6.2 3.4 - 10.8 x10E3/uL   RBC 4.49  3.77 - 5.28 x10E6/uL   Hemoglobin 14.5 11.1 - 15.9 g/dL   Hematocrit 16.1 09.6 - 46.6 %   MCV 96 79 - 97 fL   MCH 32.3 26.6 - 33.0 pg   MCHC 33.6 31.5 - 35.7 g/dL   RDW 04.5 40.9 - 81.1 %   Platelets 242 150 - 450 x10E3/uL   Neutrophils 57 Not  Estab. %   Lymphs 35 Not Estab. %   Monocytes 6 Not Estab. %   Eos 2 Not Estab. %   Basos 0 Not Estab. %   Neutrophils Absolute 3.5 1.4 - 7.0 x10E3/uL   Lymphocytes Absolute 2.2 0.7 - 3.1 x10E3/uL   Monocytes Absolute 0.4 0.1 - 0.9 x10E3/uL   EOS (ABSOLUTE) 0.1 0.0 - 0.4 x10E3/uL   Basophils Absolute 0.0 0.0 - 0.2 x10E3/uL   Immature Granulocytes 0 Not Estab. %   Immature Grans (Abs) 0.0 0.0 - 0.1 x10E3/uL  Comprehensive metabolic panel   Collection Time: 10/20/23  2:43 PM  Result Value Ref Range   Sodium 140 135 - 145 mEq/L   Potassium 3.8 3.5 - 5.1 mEq/L   Chloride 102 96 - 112 mEq/L   CO2 29 19 - 32 mEq/L   Glucose, Bld 125 (H) 70 - 99 mg/dL   BUN 14 6 - 23 mg/dL   Creatinine, Ser 9.14 0.40 - 1.20 mg/dL   Total Bilirubin 2.5 (H) 0.2 - 1.2 mg/dL   Alkaline Phosphatase 133 (H) 39 - 117 U/L   AST 32 0 - 37 U/L   ALT 33 0 - 35 U/L   Total Protein 6.9 6.0 - 8.3 g/dL   Albumin 4.6 3.5 - 5.2 g/dL   GFR 78.29 >56.21 mL/min   Calcium 9.6 8.4 - 10.5 mg/dL  Reticulocyte, Hemoglobin Panel   Collection Time: 10/20/23  2:43 PM  Result Value Ref Range   Cellular Hemoglobin, Retic WILL FOLLOW    Reticulocytes Percent WILL FOLLOW    Immature Reticulocyte Fraction WILL FOLLOW    Reticulocyte Number WILL FOLLOW    Received urine culture 3/8, negative MICRO NUMBER: [id number]  SPECIMEN QUALITY: Adequate  Sample Source URINE  STATUS: FINAL  Result: No Growth  Resulting Agency [facility]

## 2023-10-19 ENCOUNTER — Encounter: Payer: Self-pay | Admitting: Family Medicine

## 2023-10-20 ENCOUNTER — Ambulatory Visit: Payer: Medicare HMO | Admitting: Family Medicine

## 2023-10-20 VITALS — BP 110/62 | HR 66 | Temp 98.1°F | Resp 18 | Ht 63.0 in | Wt 149.8 lb

## 2023-10-20 DIAGNOSIS — R8290 Unspecified abnormal findings in urine: Secondary | ICD-10-CM | POA: Diagnosis not present

## 2023-10-20 DIAGNOSIS — R7401 Elevation of levels of liver transaminase levels: Secondary | ICD-10-CM | POA: Diagnosis not present

## 2023-10-20 DIAGNOSIS — R17 Unspecified jaundice: Secondary | ICD-10-CM | POA: Diagnosis not present

## 2023-10-20 LAB — POCT URINALYSIS DIP (MANUAL ENTRY)
Bilirubin, UA: NEGATIVE
Blood, UA: NEGATIVE
Glucose, UA: NEGATIVE mg/dL
Ketones, POC UA: NEGATIVE mg/dL
Nitrite, UA: NEGATIVE
Protein Ur, POC: NEGATIVE mg/dL
Spec Grav, UA: <=1.005 — AB (ref 1.010–1.025)
Urobilinogen, UA: 0.2 U/dL
pH, UA: 6 (ref 5.0–8.0)

## 2023-10-20 NOTE — Patient Instructions (Addendum)
 Good to see you today- best of luck with your colonoscopy  I will be in touch with your labs and your urine culture

## 2023-10-21 ENCOUNTER — Encounter: Payer: Self-pay | Admitting: Family Medicine

## 2023-10-21 LAB — COMPREHENSIVE METABOLIC PANEL
ALT: 33 U/L (ref 0–35)
AST: 32 U/L (ref 0–37)
Albumin: 4.6 g/dL (ref 3.5–5.2)
Alkaline Phosphatase: 133 U/L — ABNORMAL HIGH (ref 39–117)
BUN: 14 mg/dL (ref 6–23)
CO2: 29 meq/L (ref 19–32)
Calcium: 9.6 mg/dL (ref 8.4–10.5)
Chloride: 102 meq/L (ref 96–112)
Creatinine, Ser: 0.86 mg/dL (ref 0.40–1.20)
GFR: 67.59 mL/min (ref >60.00)
Glucose, Bld: 125 mg/dL — ABNORMAL HIGH (ref 70–99)
Potassium: 3.8 meq/L (ref 3.5–5.1)
Sodium: 140 meq/L (ref 135–145)
Total Bilirubin: 2.5 mg/dL — ABNORMAL HIGH (ref 0.2–1.2)
Total Protein: 6.9 g/dL (ref 6.0–8.3)

## 2023-10-21 LAB — URINE CULTURE
MICRO NUMBER:: 16168300
Result:: NO GROWTH
SPECIMEN QUALITY:: ADEQUATE

## 2023-10-22 ENCOUNTER — Encounter: Payer: Self-pay | Admitting: Family Medicine

## 2023-10-22 DIAGNOSIS — R32 Unspecified urinary incontinence: Secondary | ICD-10-CM

## 2023-10-22 DIAGNOSIS — R748 Abnormal levels of other serum enzymes: Secondary | ICD-10-CM

## 2023-10-26 ENCOUNTER — Ambulatory Visit: Payer: Medicare HMO | Attending: Physician Assistant | Admitting: Physician Assistant

## 2023-10-26 ENCOUNTER — Encounter: Payer: Self-pay | Admitting: Physician Assistant

## 2023-10-26 VITALS — BP 120/56 | HR 57 | Ht 62.5 in | Wt 151.0 lb

## 2023-10-26 DIAGNOSIS — Z01810 Encounter for preprocedural cardiovascular examination: Secondary | ICD-10-CM | POA: Diagnosis not present

## 2023-10-26 DIAGNOSIS — I2510 Atherosclerotic heart disease of native coronary artery without angina pectoris: Secondary | ICD-10-CM | POA: Diagnosis not present

## 2023-10-26 DIAGNOSIS — I10 Essential (primary) hypertension: Secondary | ICD-10-CM

## 2023-10-26 DIAGNOSIS — E785 Hyperlipidemia, unspecified: Secondary | ICD-10-CM | POA: Diagnosis not present

## 2023-10-26 MED ORDER — ATORVASTATIN CALCIUM 40 MG PO TABS: 40.0000 mg | ORAL_TABLET | Freq: Every day | ORAL | 3 refills | Status: AC

## 2023-10-26 NOTE — Patient Instructions (Signed)
 Medication Instructions:  HOLD PLAVIX FOR 5 TO 7 DAYS PRIOR TO PROCEDURE THEN RESUME AFTERWARDS  *If you need a refill on your cardiac medications before your next appointment, please call your pharmacy*   Lab Work: NO LABS If you have labs (blood work) drawn today and your tests are completely normal, you will receive your results only by: MyChart Message (if you have MyChart) OR A paper copy in the mail If you have any lab test that is abnormal or we need to change your treatment, we will call you to review the results.   Testing/Procedures: NO TESTING   Follow-Up: At [facility], you and your health needs are our priority.  As part of our continuing mission to provide you with exceptional heart care, we have created designated Provider Care Teams.  These Care Teams include your primary Cardiologist (physician) and Advanced Practice Providers (APPs -  Physician Assistants and Nurse Practitioners) who all work together to provide you with the care you need, when you need it.  Your next appointment:   1 year(s)  Provider:   Jodelle Red, MD

## 2023-10-26 NOTE — Addendum Note (Signed)
 Addended by: Abbe Amsterdam C on: 10/26/2023 05:44 AM   Modules accepted: Orders

## 2023-10-26 NOTE — Progress Notes (Signed)
 Cardiology Office Note:  .   Date:  10/26/2023  ID:  [name], DOB June 06, 1951, MRN [id number] PCP: Pearline Cables, MD  [facility] Providers Cardiologist:  Nicki Guadalajara, MD    History of Present Illness: .   [name] is a 73 y.o. female with PMH of CAD, palpitation, hypertension, hyperlipidemia, restless leg syndrome and a history of breast cancer s/p double mastectomy 2012.  Echocardiogram in May 2019 showed EF 55 to 60%, no regional wall motion abnormality, grade 2 DD, no significant valve issue.  A coronary CT obtained on 02/27/2018 showed coronary calcium score 6 which placed the patient at 55th percentile for age and sex matched control, 50 to 75% mid LAD lesion that was found to be hemodynamically significant with FFR.  Subsequent cardiac catheterization performed on 03/07/2018 showed 95% fibrotic appearing proximal LAD lesion treated with Cutting Balloon angioplasty and 2.5 x 18 mm DES.  Patient was most recently seen by Carlyon Shadow NP on 05/13/2023 for chest discomfort.  Subsequent PET stress test obtained on 05/31/2023 was low risk, no significant perfusion abnormality.  Based on cardiac clearance requested we received on 10/12/2023, Dr. Ewing Schlein her GI doctor wish to do colonoscopy procedure.  It was requested for her to hold aspirin and Plavix.  Patient presents today for follow-up.  She stays very active.  She denies any exertional chest pain or shortness of breath.  She has no lower extremity edema, orthopnea or PND.  I discussed her case with Dr. Tresa Endo, we will set her up to establish with Dr. Jodelle Red after Dr. Landry Dyke retirement.  She lives closer to [facility] facility as well.  Overall, she is at acceptable risk to proceed with colonoscopy procedure.  She may hold the Plavix for 5 to 7 days prior to the procedure and restart as soon as possible afterward at his GI doctor's discretion.  Given prior cardiac history, we would prefer she  continue on the aspirin through the procedure.  Otherwise, she can follow-up in 1 year.  ROS:   She denies chest pain, palpitations, dyspnea, pnd, orthopnea, n, v, dizziness, syncope, edema, weight gain, or early satiety. All other systems reviewed and are otherwise negative except as noted above.    Studies Reviewed: Marland Kitchen   EKG Interpretation Date/Time:  Wednesday October 26 2023 14:07:10 EDT Ventricular Rate:  57 PR Interval:  136 QRS Duration:  72 QT Interval:  412 QTC Calculation: 401 R Axis:   13  Text Interpretation: Sinus bradycardia  No significant ST-T wave changes Confirmed by Azalee Course [phone]) on 10/26/2023 2:10:44 PM    Cardiac Studies & Procedures   ______________________________________________________________________________________________ CARDIAC CATHETERIZATION  CARDIAC CATHETERIZATION 03/07/2018  Narrative  Prox LAD lesion is 95% stenosed.  Post intervention, there is a 0% residual stenosis.  A stent was successfully placed.  Single-vessel coronary obstructive disease with a 95% fibrotic appearing mid LAD stenosis distal to a proximal septal and diagonal vessel.  Normal left circumflex and dominant RCA.  LVEDP 21 mm.  Successful PCI to the mid LAD utilizing Cutting Balloon and DES stenting with a 2.5 x 18 mm Resolute Onyx stent with the 95% stenosis being reduced to 0% evidence for brisk TIMI-3 flow with no evidence for dissection at the completion of the procedure.  RECOMMENDATION:  Recommend uninterrupted dual antiplatelet therapy with Aspirin 81mg  daily and Ticagrelor 90mg  twice daily for a minimum of 12 months (ACS - Class I recommendation).  The patient will continue with low-dose beta-blocker  therapy.  Aggressive lipid-lowering therapy with high potency statin.  Findings Coronary Findings Diagnostic  Dominance: Right  Left Anterior Descending Prox LAD lesion is 95% stenosed.  Intervention  Prox LAD lesion Stent A stent was successfully  placed. Post-Intervention Lesion Assessment The intervention was successful. Pre-interventional TIMI flow is 3. Post-intervention TIMI flow is 3. There is a 0% residual stenosis post intervention.   STRESS TESTS  NM PET CT CARDIAC PERFUSION MULTI W/ABSOLUTE BLOODFLOW 05/31/2023  Narrative   The study is normal. The study is low risk.   LV perfusion is normal.   Rest left ventricular function is normal. Stress left ventricular function is normal. End diastolic cavity size is normal. End systolic cavity size is normal.   Myocardial blood flow was computed to be 0.62ml/g/min at rest and 2.3ml/g/min at stress. Global myocardial blood flow reserve was 3.06 and was normal.   Coronary calcium assessment not performed due to prior revascularization.Aortic atherosclerosis noted.  Aortic valve calcification is minimal.   Electronically Signed  By: Riley Lam MD  EXAM: OVER-READ INTERPRETATION  CT CHEST  The following report is a limited chest CT over-read performed by radiologist Dr. Irma Newness [facility] on 05/31/2023. This over-read does not include interpretation of cardiac or coronary anatomy or pathology nor does it include evaluation of the PET data. The cardiac PET-CT interpretation by the cardiologist is attached.  COMPARISON:  Chest CT 02/12/2022  FINDINGS: Mediastinum/Nodes: No enlarged lymph nodes within the visualized mediastinum.There is aortic atherosclerosis.  Lungs/Pleura: There is no pleural effusion. Previously demonstrated tiny (4 mm) subpleural nodule posteriorly in the RIGHT lower lobe on image 76/4 is unchanged, consistent with a benign finding. The lungs are otherwise clear.  Upper abdomen: No significant findings in the visualized upper abdomen.  Musculoskeletal/Chest wall: No chest wall mass or suspicious osseous findings within the visualized chest. Bilateral breast implants are noted.  IMPRESSION: No significant  extracardiac findings within the visualized chest.   Electronically Signed By: Carey Bullocks M.D. On: 05/31/2023 11:58   ECHOCARDIOGRAM  ECHOCARDIOGRAM COMPLETE 01/02/2018  Narrative *[facility]* [address] [place], [place] [address] [phone]  ------------------------------------------------------------------- Transthoracic Echocardiography  Patient:    Vail, Basista MR #:       [id number] Study Date: 01/02/2018 Gender:     F Age:        69 Height:     157.5 cm Weight:     67.9 kg BSA:        1.74 m^2 Pt. Status: Room:  ATTENDING    Nicki Guadalajara, M.D. ORDERING     Nicki Guadalajara, M.D. REFERRING    Nicki Guadalajara, M.D. SONOGRAPHER  Randa Evens, Will PERFORMING   [facility], Outpatient  cc:  ------------------------------------------------------------------- LV EF: 55% -   60%  ------------------------------------------------------------------- Indications:      (R07.9).  ------------------------------------------------------------------- History:   PMH:  Acquired from the patient and from the patient&'s chart.  Chest pain.  Risk factors:  Dyslipidemia.  ------------------------------------------------------------------- Study Conclusions  - Left ventricle: The cavity size was normal. Wall thickness was normal. Systolic function was normal. The estimated ejection fraction was in the range of 55% to 60%. Wall motion was normal; there were no regional wall motion abnormalities. Features are consistent with a pseudonormal left ventricular filling pattern, with concomitant abnormal relaxation and increased filling pressure (grade 2 diastolic dysfunction). - Aortic valve: Valve mobility was restricted. There was mild regurgitation.  Impressions:  - Normal LV systolic function; moderate diastolic dysfunction; sclerotic aortic valve  with mild AI.  ------------------------------------------------------------------- Study data:  No prior study was  available for comparison.  Study status:  Routine.  Procedure:  The patient reported no pain pre or post test. Transthoracic echocardiography for left ventricular function evaluation. Image quality was adequate.  Study completion: There were no complications.          Transthoracic echocardiography.  M-mode, complete 2D, spectral Doppler, and color Doppler.  Birthdate:  Patient birthdate: 06-10-1951.  Age:  Patient is 73 yr old.  Sex:  Gender: female.    BMI: 27.4 kg/m^2.  Blood pressure:     124/62  Patient status:  Outpatient.  Study date: Study date: 01/02/2018. Study time: 09:59 AM.  Location:  [facility]  -------------------------------------------------------------------  ------------------------------------------------------------------- Left ventricle:  The cavity size was normal. Wall thickness was normal. Systolic function was normal. The estimated ejection fraction was in the range of 55% to 60%. Wall motion was normal; there were no regional wall motion abnormalities. Features are consistent with a pseudonormal left ventricular filling pattern, with concomitant abnormal relaxation and increased filling pressure (grade 2 diastolic dysfunction).  ------------------------------------------------------------------- Aortic valve:   Trileaflet; mildly calcified leaflets. Valve mobility was restricted.  Doppler:  Transvalvular velocity was within the normal range. There was no stenosis. There was mild regurgitation.  ------------------------------------------------------------------- Aorta:  Aortic root: The aortic root was normal in size.  ------------------------------------------------------------------- Mitral valve:   Structurally normal valve.   Mobility was not restricted.  Doppler:  Transvalvular velocity was within the normal range. There was no evidence for stenosis. There was no regurgitation.    Peak gradient (D): 3 mm  Hg.  ------------------------------------------------------------------- Left atrium:  The atrium was normal in size.  ------------------------------------------------------------------- Right ventricle:  The cavity size was normal. Systolic function was normal.  ------------------------------------------------------------------- Pulmonic valve:    Doppler:  Transvalvular velocity was within the normal range. There was no evidence for stenosis.  ------------------------------------------------------------------- Tricuspid valve:   Structurally normal valve.    Doppler: Transvalvular velocity was within the normal range. There was trivial regurgitation.  ------------------------------------------------------------------- Pulmonary artery:   Systolic pressure was within the normal range.  ------------------------------------------------------------------- Right atrium:  The atrium was normal in size.  ------------------------------------------------------------------- Pericardium:  There was no pericardial effusion.  ------------------------------------------------------------------- Systemic veins: Inferior vena cava: The vessel was normal in size.  ------------------------------------------------------------------- Measurements  Left ventricle                           Value        Reference LV ID, ED, PLAX chordal          (L)     39.6  mm     43 - 52 LV ID, ES, PLAX chordal                  25.3  mm     23 - 38 LV fx shortening, PLAX chordal           36    %      >=29 LV PW thickness, ED                      9.31  mm     --------- IVS/LV PW ratio, ED                      1.08         <=1.3 Stroke volume, 2D  46    ml     --------- Stroke volume/bsa, 2D                    26    ml/m^2 --------- LV e&', lateral                           6.82  cm/s   --------- LV E/e&', lateral                         12.96        --------- LV e&', medial                             6.92  cm/s   --------- LV E/e&', medial                          12.77        --------- LV e&', average                           6.87  cm/s   --------- LV E/e&', average                         12.87        ---------  Ventricular septum                       Value        Reference IVS thickness, ED                        10.1  mm     ---------  LVOT                                     Value        Reference LVOT ID, S                               16    mm     --------- LVOT area                                2.01  cm^2   --------- LVOT ID                                  16    mm     --------- LVOT peak velocity, S                    108   cm/s   --------- LVOT mean velocity, S                    62.1  cm/s   --------- LVOT VTI, S                              22.9  cm     --------- LVOT peak gradient, S  5     mm Hg  --------- Stroke volume (SV), LVOT DP              46    ml     --------- Stroke index (SV/bsa), LVOT DP           26.4  ml/m^2 ---------  Aortic valve                             Value        Reference Aortic regurg pressure half-time         642   ms     ---------  Aorta                                    Value        Reference Aortic root ID, ED                       27    mm     --------- Ascending aorta ID, A-P, S               32    mm     ---------  Left atrium                              Value        Reference LA ID, A-P, ES                           36    mm     --------- LA ID/bsa, A-P                           2.07  cm/m^2 <=2.2 LA volume, S                             36    ml     --------- LA volume/bsa, S                         20.7  ml/m^2 --------- LA volume, ES, 1-p A4C                   34    ml     --------- LA volume/bsa, ES, 1-p A4C               19.5  ml/m^2 --------- LA volume, ES, 1-p A2C                   36    ml     --------- LA volume/bsa, ES, 1-p A2C               20.7  ml/m^2 ---------  Mitral valve                              Value        Reference Mitral E-wave peak velocity              88.4  cm/s   --------- Mitral A-wave peak velocity  61.7  cm/s   --------- Mitral deceleration time                 176   ms     150 - 230 Mitral peak gradient, D                  3     mm Hg  --------- Mitral E/A ratio, peak                   1.4          ---------  Pulmonary arteries                       Value        Reference PA pressure, S, DP                       28    mm Hg  <=30  Tricuspid valve                          Value        Reference Tricuspid regurg peak velocity           248   cm/s   --------- Tricuspid peak RV-RA gradient            25    mm Hg  ---------  Systemic veins                           Value        Reference Estimated CVP                            3     mm Hg  ---------  Right ventricle                          Value        Reference RV pressure, S, DP                       28    mm Hg  <=30 RV s&', lateral, S                        13.9  cm/s   ---------  Legend: (L)  and  (H)  mark values outside specified reference range.  ------------------------------------------------------------------- Prepared and Electronically Authenticated by  Olga Millers 2019-05-20T16:31:26      CT SCANS  CT CORONARY FRACTIONAL FLOW RESERVE DATA PREP 02/27/2018  Narrative CLINICAL DATA:  CAD  EXAM: FFR-CT  TECHNIQUE: The best systolic and diastolic phases of the patients gated cardiac CTA were sent to [facility] for fluid hemodynamic analysis using both normal and sharp kernels  CONTRAST:  None  COMPARISON:  None  FINDINGS: FFR CT was normal in RCA and circumflex FFR CT was markedly abnormal in the mid LAD at. 5  IMPRESSION: Hemodynamically significant mid LAD stenosis with FFR CT .50  Patient will be referred for heart cath with Dr Tresa Endo   Electronically Signed By: Charlton Haws M.D. On: 02/27/2018 15:26   CT CORONARY MORPH W/CTA COR  W/SCORE 02/27/2018  Addendum 02/27/2018 12:07 PM ADDENDUM REPORT: 02/27/2018 12:05  CLINICAL DATA:  Chest pain  EXAM: Cardiac CTA  MEDICATIONS:  Sub lingual nitro. 4mg  and lopressor 5mg   TECHNIQUE: The patient was scanned on a Siemens 192 slice scanner. Gantry rotation speed was 270 msecs. Collimation was .9mm. A 100 kV prospective scan was triggered in the descending thoracic aorta at 111 HU's with 5% padding centered around 78% of the R-R interval. Average HR during the scan was 52 bpm. The 3D data set was interpreted on a dedicated work station using MPR, MIP and VRT modes. A total of 80cc of contrast was used.  FINDINGS: Non-cardiac: See separate report from [facility]. No significant findings on limited lung and soft tissue windows.  Calcium Score: Mild calcium noted in LM and mid LAD  Coronary Arteries: Right dominant with no anomalies  LM: Less than 20% calcific stenosis  LAD: 50-75% mid LAD mixed plaque  D1: Normal  D2: Normal  Circumflex: Normal  OM1: Normal  OM2: Normla  RCA: Normal  PDA: Normal  PLA:  Normal  IMPRESSION: 1.  Aortic root normal 3.3 cm  2.  Calcium score 6 which is 55 th percentile for age and sex  3.  50-75% mid LAD stenosis study will be sent for FFR CT  Charlton Haws   Electronically Signed By: Charlton Haws M.D. On: 02/27/2018 12:05  Narrative EXAM: OVER-READ INTERPRETATION  CT CHEST  The following report is an over-read performed by radiologist Dr. Trudie Reed of [facility] on 02/27/2018. This over-read does not include interpretation of cardiac or coronary anatomy or pathology. The coronary calcium score/coronary CTA interpretation by the cardiologist is attached.  COMPARISON:  None.  FINDINGS: Aortic atherosclerosis. 2 mm nodule in the periphery of the left upper lobe (axial image 11 of series 11). Within the visualized portions of the thorax there are no other larger  suspicious appearing pulmonary nodules or masses, there is no acute consolidative airspace disease, no pleural effusions, no pneumothorax and no lymphadenopathy. Visualized portions of the upper abdomen are unremarkable. There are no aggressive appearing lytic or blastic lesions noted in the visualized portions of the skeleton. Bilateral breast implants are incidentally noted.  IMPRESSION: 1. 2 mm left upper lobe nodule, nonspecific but statistically likely benign. No follow-up needed if patient is low-risk. Non-contrast chest CT can be considered in 12 months if patient is high-risk. This recommendation follows the consensus statement: Guidelines for Management of Incidental Pulmonary Nodules Detected on CT Images: From the Fleischner Society 2017; Radiology 2017; 284:228-243. 2.  Aortic Atherosclerosis (ICD10-I70.0).  Electronically Signed: By: Trudie Reed M.D. On: 02/27/2018 11:37     ______________________________________________________________________________________________      Risk Assessment/Calculations:             Physical Exam:   VS:  BP (!) 120/56 (BP Location: Right Arm, Patient Position: Sitting, Cuff Size: Normal)   Pulse (!) 57   Ht 5' 2.5" (1.588 m)   Wt 151 lb (68.5 kg)   LMP 09/10/2006   SpO2 96%   BMI 27.18 kg/m    Wt Readings from Last 3 Encounters:  10/26/23 151 lb (68.5 kg)  10/20/23 149 lb 12.8 oz (67.9 kg)  05/13/23 149 lb (67.6 kg)    GEN: Well nourished, well developed in no acute distress NECK: No JVD; No carotid bruits CARDIAC: RRR, no murmurs, rubs, gallops RESPIRATORY:  Clear to auscultation without rales, wheezing or rhonchi  ABDOMEN: Soft, non-tender, non-distended EXTREMITIES:  No edema; No deformity   ASSESSMENT AND PLAN: .    Preoperative clearance: Upcoming colonoscopy by Dr. Ewing Schlein of GI service.  Patient is at acceptable risk to proceed with colonoscopy without further workup, we would prefer she stay on the aspirin  through the procedure.  If needed, she can hold Plavix for 5 to 7 days prior to the procedure and restart as soon as possible afterward at the GI physician's discretion.  CAD: Last PCI was in 2019.  Continue on the aspirin, may hold Plavix prior to colonoscopy.  Recent PET stress test in October 2024 was normal.  Hypertension: Blood pressure well-controlled on current therapy  Hyperlipidemia: On atorvastatin 40 mg daily.  Patient has requested a new prescription.       Dispo: Follow-up in 1 year  Signed, Azalee Course, [place]

## 2023-10-26 NOTE — Telephone Encounter (Signed)
   Patient Name: [name]  DOB: 1951-02-24 MRN: [id number]  Primary Cardiologist: Nicki Guadalajara, MD  Chart reviewed as part of pre-operative protocol coverage. Given past medical history and time since last visit, based on ACC/AHA guidelines, [name] is at acceptable risk for the planned procedure without further cardiovascular testing.   We would prefer the patient to continue aspirin through the procedure, he may hold Plavix for 5 to 7 days prior to the procedure and restart as soon as possible afterward at his GI doctor's discretion based on bleeding risk.   The patient was advised that if she develops new symptoms prior to surgery to contact our office to arrange for a follow-up visit, and she verbalized understanding.  I will route this recommendation to the requesting party via Epic fax function and remove from pre-op pool.  Please call with questions.  [place], [place] 10/26/2023, 2:58 PM

## 2023-11-01 ENCOUNTER — Encounter: Payer: Self-pay | Admitting: Family Medicine

## 2023-11-01 LAB — CBC WITH DIFFERENTIAL/PLATELET
Basophils Absolute: 0 10*3/uL (ref 0.0–0.2)
Basos: 0 %
EOS (ABSOLUTE): 0.1 10*3/uL (ref 0.0–0.4)
Eos: 2 %
Hematocrit: 43.1 % (ref 34.0–46.6)
Hemoglobin: 14.5 g/dL (ref 11.1–15.9)
Immature Grans (Abs): 0 10*3/uL (ref 0.0–0.1)
Immature Granulocytes: 0 %
Lymphocytes Absolute: 2.2 10*3/uL (ref 0.7–3.1)
Lymphs: 35 %
MCH: 32.3 pg (ref 26.6–33.0)
MCHC: 33.6 g/dL (ref 31.5–35.7)
MCV: 96 fL (ref 79–97)
Monocytes Absolute: 0.4 10*3/uL (ref 0.1–0.9)
Monocytes: 6 %
Neutrophils Absolute: 3.5 10*3/uL (ref 1.4–7.0)
Neutrophils: 57 %
Platelets: 242 10*3/uL (ref 150–450)
RBC: 4.49 x10E6/uL (ref 3.77–5.28)
RDW: 12.3 % (ref 11.7–15.4)
WBC: 6.2 10*3/uL (ref 3.4–10.8)

## 2023-11-01 LAB — RETICULOCYTE, HEMOGLOBIN PANEL

## 2023-11-02 DIAGNOSIS — Z09 Encounter for follow-up examination after completed treatment for conditions other than malignant neoplasm: Secondary | ICD-10-CM | POA: Diagnosis not present

## 2023-11-02 DIAGNOSIS — D125 Benign neoplasm of sigmoid colon: Secondary | ICD-10-CM | POA: Diagnosis not present

## 2023-11-02 DIAGNOSIS — Z860100 Personal history of colon polyps, unspecified: Secondary | ICD-10-CM | POA: Diagnosis not present

## 2023-11-02 DIAGNOSIS — D175 Benign lipomatous neoplasm of intra-abdominal organs: Secondary | ICD-10-CM | POA: Diagnosis not present

## 2023-11-02 DIAGNOSIS — K621 Rectal polyp: Secondary | ICD-10-CM | POA: Diagnosis not present

## 2023-11-02 LAB — HM COLONOSCOPY

## 2023-12-01 DIAGNOSIS — N3944 Nocturnal enuresis: Secondary | ICD-10-CM | POA: Diagnosis not present

## 2023-12-01 DIAGNOSIS — N3946 Mixed incontinence: Secondary | ICD-10-CM | POA: Diagnosis not present

## 2023-12-01 DIAGNOSIS — R350 Frequency of micturition: Secondary | ICD-10-CM | POA: Diagnosis not present

## 2023-12-10 ENCOUNTER — Encounter (INDEPENDENT_AMBULATORY_CARE_PROVIDER_SITE_OTHER): Payer: Self-pay | Admitting: Family Medicine

## 2023-12-10 DIAGNOSIS — M62838 Other muscle spasm: Secondary | ICD-10-CM | POA: Diagnosis not present

## 2023-12-10 MED ORDER — METHOCARBAMOL 500 MG PO TABS: 500.0000 mg | ORAL_TABLET | Freq: Three times a day (TID) | ORAL | 0 refills | Status: DC | PRN

## 2023-12-10 NOTE — Telephone Encounter (Signed)

## 2023-12-19 ENCOUNTER — Other Ambulatory Visit

## 2023-12-21 ENCOUNTER — Encounter: Payer: Self-pay | Admitting: Family Medicine

## 2023-12-22 ENCOUNTER — Encounter: Payer: Self-pay | Admitting: "Endocrinology

## 2023-12-22 ENCOUNTER — Ambulatory Visit: Admitting: "Endocrinology

## 2023-12-22 VITALS — BP 110/70 | HR 65 | Ht 62.5 in | Wt 152.0 lb

## 2023-12-22 DIAGNOSIS — R748 Abnormal levels of other serum enzymes: Secondary | ICD-10-CM | POA: Diagnosis not present

## 2023-12-22 DIAGNOSIS — R6889 Other general symptoms and signs: Secondary | ICD-10-CM
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
## 2024-01-03 ENCOUNTER — Other Ambulatory Visit

## 2024-01-03 DIAGNOSIS — H524 Presbyopia: Secondary | ICD-10-CM | POA: Diagnosis not present

## 2024-01-03 DIAGNOSIS — H52209 Unspecified astigmatism, unspecified eye: Secondary | ICD-10-CM | POA: Diagnosis not present

## 2024-01-03 DIAGNOSIS — R6889 Other general symptoms and signs: Secondary | ICD-10-CM | POA: Diagnosis not present

## 2024-01-03 DIAGNOSIS — H5203 Hypermetropia, bilateral: Secondary | ICD-10-CM | POA: Diagnosis not present

## 2024-01-03 DIAGNOSIS — R748 Abnormal levels of other serum enzymes: Secondary | ICD-10-CM | POA: Diagnosis not present

## 2024-01-04 LAB — COMPREHENSIVE METABOLIC PANEL WITH GFR
AG Ratio: 2.6 (calc) — ABNORMAL HIGH (ref 1.0–2.5)
ALT: 31 U/L — ABNORMAL HIGH (ref 6–29)
AST: 33 U/L (ref 10–35)
Albumin: 4.4 g/dL (ref 3.6–5.1)
Alkaline phosphatase (APISO): 110 U/L (ref 37–153)
BUN: 11 mg/dL (ref 7–25)
CO2: 29 mmol/L (ref 20–32)
Calcium: 9.2 mg/dL (ref 8.6–10.4)
Chloride: 107 mmol/L (ref 98–110)
Creat: 0.7 mg/dL (ref 0.60–1.00)
Globulin: 1.7 g/dL — ABNORMAL LOW (ref 1.9–3.7)
Glucose, Bld: 89 mg/dL (ref 65–99)
Potassium: 4.5 mmol/L (ref 3.5–5.3)
Sodium: 142 mmol/L (ref 135–146)
Total Bilirubin: 1.4 mg/dL — ABNORMAL HIGH (ref 0.2–1.2)
Total Protein: 6.1 g/dL (ref 6.1–8.1)
eGFR: 92 mL/min/{1.73_m2} (ref >=60)

## 2024-01-04 LAB — TSH: TSH: 1.07 m[IU]/L (ref 0.40–4.50)

## 2024-01-04 LAB — T4, FREE: Free T4: 1 ng/dL (ref 0.8–1.8)

## 2024-01-05 ENCOUNTER — Other Ambulatory Visit: Payer: Self-pay | Admitting: Family Medicine

## 2024-01-05 DIAGNOSIS — G4700 Insomnia, unspecified: Secondary | ICD-10-CM

## 2024-02-07 ENCOUNTER — Encounter: Payer: Self-pay | Admitting: "Endocrinology

## 2024-02-07 ENCOUNTER — Ambulatory Visit: Admitting: "Endocrinology

## 2024-02-07 VITALS — BP 100/60 | HR 62 | Ht 62.5 in | Wt 152.0 lb

## 2024-02-07 DIAGNOSIS — R232 Flushing: Secondary | ICD-10-CM

## 2024-02-07 DIAGNOSIS — R748 Abnormal levels of other serum enzymes: Secondary | ICD-10-CM | POA: Diagnosis not present
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
## 2024-02-09 DIAGNOSIS — N3946 Mixed incontinence: Secondary | ICD-10-CM | POA: Diagnosis not present

## 2024-02-09 DIAGNOSIS — R350 Frequency of micturition: Secondary | ICD-10-CM | POA: Diagnosis not present

## 2024-02-11 ENCOUNTER — Other Ambulatory Visit: Payer: Self-pay | Admitting: Family Medicine

## 2024-02-11 DIAGNOSIS — F4323 Adjustment disorder with mixed anxiety and depressed mood: Secondary | ICD-10-CM

## 2024-05-01 ENCOUNTER — Ambulatory Visit: Payer: Self-pay | Admitting: *Deleted

## 2024-05-01 NOTE — Telephone Encounter (Signed)
 Copied from CRM [id number]. Topic: Clinical - Red Word Triage >> May 01, 2024  1:37 PM Deleta RAMAN wrote: Red Word that prompted transfer to Nurse Triage: previous swelling and now lump on left foot. Feels like foot is rotting no pain. Reason for Disposition  [1] After 3 days AND [2] pain not improved  Answer Assessment - Initial Assessment Questions 1. MECHANISM: How did the injury happen? (e.g., twisting injury, direct blow)      My left foot is colorful.   I don't know what happened.   I was on vacation and it swelled which went a way.   I have a lump size of 50 cents piece.  It does not go down.   There are streaks of darkness going up my foot. L  It's not cold or hot.   It looks black and blue.   The lump on the top of my foot is tender.   I'm on blood thinners.   This is really abnormal.   I've not had an injury.   I'm concerned about it.  2. ONSET: When did the injury happen? (e.g., minutes or hours ago)      No injuries 3. LOCATION: Where is the injury located?      Top of my left foot above my toes 4. APPEARANCE of INJURY: What does the injury look like?      See above 5. WEIGHT-BEARING: Can you put weight on that foot? Can you walk (four steps or more)?       I can walk  6. SIZE: For cuts, bruises, or swelling, ask: How large is it? (e.g., inches or centimeters;  entire joint)      There is lump on the top of my foot. 7. PAIN: Is there pain? If Yes, ask: How bad is the pain? What does it keep you from doing? (Scale 0-10; or none, mild, moderate, severe)     Yes it's tender. 8. TETANUS: For any breaks in the skin, ask: When was your last tetanus booster?     Not asked 9. OTHER SYMPTOMS: Do you have any other symptoms?      The lump is concerning me most. 10. PREGNANCY: Is there any chance you are pregnant? When was your last menstrual period?       N/A  Protocols used: Foot Injury-A-AH FYI Only or Action Required?: FYI only for provider.  Patient was  last seen in primary care on 10/20/2023 by Copland, Harlene BROCKS, MD.  Called Nurse Triage reporting Foot Swelling.Lump on top of left foot that is tender with streaks coming out from it.   No injuries.    Symptoms began several days ago.  Interventions attempted: Nothing.  Symptoms are: gradually worsening.Foot is multi colored.   Equal in warmth and color.    Triage Disposition: See PCP When Office is Open (Within 3 Days)  Patient/caregiver understands and will follow disposition?: Yes

## 2024-05-01 NOTE — Telephone Encounter (Signed)
 Appt scheduled

## 2024-05-03 ENCOUNTER — Ambulatory Visit: Admitting: Family Medicine

## 2024-05-04 ENCOUNTER — Encounter: Payer: Self-pay | Admitting: Student

## 2024-05-04 ENCOUNTER — Other Ambulatory Visit: Payer: Self-pay

## 2024-05-04 ENCOUNTER — Ambulatory Visit (INDEPENDENT_AMBULATORY_CARE_PROVIDER_SITE_OTHER): Admitting: Student

## 2024-05-04 ENCOUNTER — Ambulatory Visit (HOSPITAL_BASED_OUTPATIENT_CLINIC_OR_DEPARTMENT_OTHER)
Admission: RE | Admit: 2024-05-04 | Discharge: 2024-05-04 | Disposition: A | Discharge status: Home / Self Care | Source: Ambulatory Visit | Attending: Student | Admitting: Student

## 2024-05-04 VITALS — BP 118/62 | HR 65 | Temp 98.6°F | Resp 16 | Ht 62.5 in | Wt 150.4 lb

## 2024-05-04 DIAGNOSIS — S9032XA Contusion of left foot, initial encounter: Secondary | ICD-10-CM | POA: Diagnosis not present

## 2024-05-04 DIAGNOSIS — M79672 Pain in left foot: Secondary | ICD-10-CM

## 2024-05-04 DIAGNOSIS — I10 Essential (primary) hypertension: Secondary | ICD-10-CM

## 2024-05-04 MED ORDER — AMLODIPINE BESYLATE 5 MG PO TABS: 5.0000 mg | ORAL_TABLET | Freq: Every day | ORAL | 3 refills | Status: AC

## 2024-05-04 NOTE — Progress Notes (Signed)
 Acute Office Visit  Subjective:     Patient ID: [name], female    DOB: 07-03-1951, 73 y.o.   MRN: [id number]  Chief Complaint  Patient presents with   Acute Visit    Patient presents today for  left foot check. She reports bruising, swelling and lump/knot for about 2 weeks now. Patient is concerned because she takes blood thinners.    HPI  History of Present Illness [name] is a 73 year old female with osteoporosis who presents with left foot pain and bruising for 2 weeks.   She has significant tenderness and bruising on the top of her left foot, with sudden 'black and blue' discoloration and swelling. The pain is localized on the top and slightly down the side of the foot, with a pain level of six to seven out of ten when pressed. Wearing most shoes is painful, except for one pair that does not aggravate the condition.She recalls a possible incident where luggage may have impacted her foot while unloading her car after a vacation, though she is uncertain if this caused the bruising. No prior incidents of similar bruising on her left foot. She is taking Plavix , which may contribute to increased bruising. She experiences discomfort after standing for extended periods, such as during performances, with increased discomfort by the end of the day. Denies falls.   ROS  See HPI    Objective:    BP 118/62   Pulse 65   Temp 98.6 F (37 C)   Resp 16   Ht 5' 2.5 (1.588 m)   Wt 150 lb 6.4 oz (68.2 kg)   LMP 09/10/2006   SpO2 96%   BMI 27.07 kg/m    Physical Exam  General: No acute distress. Awake and conversant.  Eyes: Normal conjunctiva, anicteric. Round symmetric pupils.  Respiratory: CTAB. Respirations are non-labored. No wheezing.  Skin: Warm. No rashes or ulcers.  Psych: Alert and oriented. Cooperative, Appropriate mood and affect, Normal judgment.  MSK: Normal ambulation. No clubbing or cyanosis.  Right foot: WNL, Full rom, cap refill +2 Left foot: TWP  and mild swelling  dorsum of the Left foot. Full ROM. Mild Ecchymosis present over the toes. Sensation in tact. Cap refill- brisk.  Neuro:  CN II-XII grossly normal.     No results found for any visits on 05/04/24.      Assessment & Plan:   Problem List Items Addressed This Visit   None Visit Diagnoses       Left foot pain    -  Primary   Relevant Orders   DG Foot Complete Left   Ambulatory referral to Sports Medicine      Left foot contusion with tenderness and bruising, likely from a crush injury. Increased bruising risk due to Plavix . Concern for possible fracture r/t osteoporosis Hx. Condition improved from initial injury but still painful with pressure and certain activities. - Order x-ray of the left foot to assess for fracture. - Refer to sports medicine for further evaluation and management. - Advise rest, ice, elevation, and wearing supportive shoes. -OTC Tylenol  or Ibuprofen prn for symptom relief    Recommend rest, ice, elevation, and compression as tolerated to reduce pain and swelling. Encourage the use of supportive footwear and limiting weight-bearing activities until pain improves. Over-the-counter analgesics such as acetaminophen  or NSAIDs may be used if no contraindications. Monitor closely for worsening pain, swelling, numbness, or skin changes, and seek medical attention if these occur.  No orders of the defined types were placed in this encounter.   No follow-ups on file.  Stefanie Hodgens L Jackalyn Haith, NP

## 2024-05-08 ENCOUNTER — Ambulatory Visit (INDEPENDENT_AMBULATORY_CARE_PROVIDER_SITE_OTHER)

## 2024-05-08 VITALS — Ht 62.5 in | Wt 150.0 lb

## 2024-05-08 DIAGNOSIS — Z0000 Encounter for general adult medical examination without abnormal findings: Secondary | ICD-10-CM

## 2024-05-08 NOTE — Patient Instructions (Addendum)
 [name],  Thank you for taking the time for your Medicare Wellness Visit. I appreciate your continued commitment to your health goals. Please review the care plan we discussed, and feel free to reach out if I can assist you further.  Medicare recommends these wellness visits once per year to help you and your care team stay ahead of potential health issues. These visits are designed to focus on prevention, allowing your provider to concentrate on managing your acute and chronic conditions during your regular appointments.  Please note that Annual Wellness Visits do not include a physical exam. Some assessments may be limited, especially if the visit was conducted virtually. If needed, we may recommend a separate in-person follow-up with your provider.  Ongoing Care Seeing your primary care provider every 3 to 6 months helps us  monitor your health and provide consistent, personalized care.   Referrals If a referral was made during today's visit and you haven't received any updates within two weeks, please contact the referred provider directly to check on the status.  Recommended Screenings:  Health Maintenance  Topic Date Due   Zoster (Shingles) Vaccine (2 of 2) 08/02/2023   Flu Shot  03/16/2024   COVID-19 Vaccine (3 - Pfizer risk series) 08/15/2024*   Medicare Annual Wellness Visit  05/08/2025   DTaP/Tdap/Td vaccine (2 - Td or Tdap) 04/03/2028   Colon Cancer Screening  11/01/2033   Pneumococcal Vaccine for age over 9  Completed   DEXA scan (bone density measurement)  Completed   Hepatitis C Screening  Completed   HPV Vaccine  Aged Out   Meningitis B Vaccine  Aged Out  *Topic was postponed. The date shown is not the original due date.       05/08/2024    2:42 PM  Advanced Directives  Does Patient Have a Medical Advance Directive? No  Would patient like information on creating a medical advance directive? No - Patient declined   Advance Care Planning is important because  it: Ensures you receive medical care that aligns with your values, goals, and preferences. Provides guidance to your family and loved ones, reducing the emotional burden of decision-making during critical moments.  Vision: Annual vision screenings are recommended for early detection of glaucoma, cataracts, and diabetic retinopathy. These exams can also reveal signs of chronic conditions such as diabetes and high blood pressure.  Dental: Annual dental screenings help detect early signs of oral cancer, gum disease, and other conditions linked to overall health, including heart disease and diabetes.  Please see the attached documents for additional preventive care recommendations.

## 2024-05-08 NOTE — Progress Notes (Addendum)
 [name] [facility] [address] [place] [address] Phone: [phone] Subjective:   [name] [name], am serving as a scribe for Dr. Arthea Thompson.  I'm seeing this patient by the request  of:  Thompson, Ellen BROCKS, MD  CC: Left foot pain  YEP:[name]  [name] is a 73 y.o. female coming in with complaint of L foot pain. Patient states that she noticed during the first week of September her foot was black and blue. Unsure if she might have turned luggage on her foot. Continued ecchymosis in 2nd toe and a small knot in the 2nd metatarsal.      Past Medical History:  Diagnosis Date   Allergy    dust mite residue, mold, mildew, grapefruit, chili peppers, cat dander, some weeds (03/07/2018)   Anxiety    Arthritis    minor; right knee, left hip (03/07/2018)   Bruises easily    Cancer of left breast (HCC)    double mastectomy 01/26/2011   Childhood asthma    Contact lens/glasses fitting    Coronary artery disease    a.  She underwent LHC 7/23 which revealed 95% stenosis of proximal LAD, managed with PCI/DES.    Depression    Essential hypertension    GERD (gastroesophageal reflux disease)    at times (03/07/2018)   Heart murmur    Hiatal hernia    High cholesterol    History of kidney stones    lots; no OR (03/07/2018)   Osteoporosis    Rectal bleeding    hemorroid   Ringing in ears, bilateral    since ~ 06/2017 (03/07/2018)   Sinus drainage    Past Surgical History:  Procedure Laterality Date   APPENDECTOMY     BREAST LUMPECTOMY W/ NEEDLE LOCALIZATION Left 10/05/2010   lumpectomy   BREAST SURGERY     CORONARY STENT INTERVENTION N/A 03/07/2018   Procedure: CORONARY STENT INTERVENTION;  Surgeon: Burnard Debby LABOR, MD;  Location: [facility];  Service: Cardiovascular;  Laterality: N/A;   LEFT HEART CATH AND CORONARY ANGIOGRAPHY N/A 03/07/2018   Procedure: LEFT HEART CATH AND CORONARY ANGIOGRAPHY;  Surgeon: Burnard Debby LABOR,  MD;  Location: [facility];  Service: Cardiovascular;  Laterality: N/A;   MASTECTOMY Right 01/26/2011   MASTECTOMY COMPLETE / SIMPLE W/ SENTINEL NODE BIOPSY Left 01/26/2011   OVARIAN CYST SURGERY  1997, 2001, 2008   Social History   Socioeconomic History   Marital status: Married    Spouse name: Not on file   Number of children: Not on file   Years of education: Not on file   Highest education level: Bachelor's degree (e.g., BA, AB, BS)  Occupational History   Not on file  Tobacco Use   Smoking status: Never   Smokeless tobacco: Never  Vaping Use   Vaping status: Never Used  Substance and Sexual Activity   Alcohol use: Yes    Comment: 03/07/2018 probably once/month   Drug use: No   Sexual activity: Not on file  Other Topics Concern   Not on file  Social History Narrative   Lives at home alone   Right handed   Caffeine: 1/2 cup of coffee daily that has (1/2 & 1/2 caf)   Social Drivers of Health   Financial Resource Strain: Low Risk  (05/08/2024)   Overall Financial Resource Strain (CARDIA)    Difficulty of Paying Living Expenses: Not hard at all  Food Insecurity: No Food Insecurity (05/08/2024)  Hunger Vital Sign    Worried About Running Out of Food in the Last Year: Never true    Ran Out of Food in the Last Year: Never true  Transportation Needs: No Transportation Needs (05/08/2024)   PRAPARE - Administrator, Civil Service (Medical): No    Lack of Transportation (Non-Medical): No  Physical Activity: Insufficiently Active (05/08/2024)   Exercise Vital Sign    Days of Exercise per Week: 1 day    Minutes of Exercise per Session: 30 min  Stress: No Stress Concern Present (05/08/2024)   [employer] of Occupational Health - Occupational Stress Questionnaire    Feeling of Stress: Only a little  Social Connections: Socially Integrated (05/08/2024)   Social Connection and Isolation Panel    Frequency of Communication with Friends and Family: More than  three times a week    Frequency of Social Gatherings with Friends and Family: Twice a week    Attends Religious Services: More than 4 times per year    Active Member of [employer] or Organizations: Yes    Attends Engineer, structural: More than 4 times per year    Marital Status: Married   Allergies  Allergen Reactions   Amitriptyline Hcl Rash    Other Reaction(s): Not available   Citrus     Other Reaction(s): Grapefruit - Reaction : Blood Test   Dust Mite Extract Other (See Comments)   Molds & Smuts    Other     Grapefruit   Sulfa Antibiotics     Other Reaction(s): hives   Elemental Sulfur  Rash   Grapefruit Extract     Other Reaction(s): wheezing   Sulfur  Rash   Family History  Problem Relation Age of Onset   Hypertension Mother    Heart Problems Mother    Hyperlipidemia Mother    Hypotension Father    Other Father        pacemaker   Heart failure Paternal Grandmother    Heart failure Paternal Grandfather    Cancer Brother    Cancer Sister    Diabetes Sister      Current Outpatient Medications (Cardiovascular):    amLODipine  (NORVASC ) 5 MG tablet, Take 1 tablet (5 mg total) by mouth daily.   atorvastatin  (LIPITOR ) 40 MG tablet, Take 1 tablet (40 mg total) by mouth daily.   metoprolol  succinate (TOPROL -XL) 25 MG 24 hr tablet, Take 0.5 tablets (12.5 mg total) by mouth daily.   nitroGLYCERIN  (NITROSTAT ) 0.4 MG SL tablet, Place 1 tablet (0.4 mg total) under the tongue every 5 (five) minutes as needed for chest pain.  Current Outpatient Medications (Respiratory):    albuterol  (VENTOLIN  HFA) 108 (90 Base) MCG/ACT inhaler, Inhale 1-2 puffs into the lungs every 4 (four) hours as needed for wheezing or shortness of breath.   cetirizine (ZYRTEC) 10 MG tablet, Take 10 mg by mouth daily as needed for allergies.    fluticasone  (FLONASE ) 50 MCG/ACT nasal spray, USE 2 SPRAYS IN EACH       NOSTRIL DAILY AS NEEDED   montelukast  (SINGULAIR ) 10 MG tablet, Take 1 tablet (10 mg  total) by mouth at bedtime.  Current Outpatient Medications (Analgesics):    aspirin  EC 81 MG tablet, Take 1 tablet (81 mg total) by mouth daily.  Current Outpatient Medications (Hematological):    clopidogrel  (PLAVIX ) 75 MG tablet, Take 1 tablet (75 mg total) by mouth daily.  Current Outpatient Medications (Other):    Ascorbic Acid (VITAMIN C) 1000 MG tablet, Take  1,000 mg by mouth daily.   cholecalciferol (VITAMIN D3) 25 MCG (1000 UNIT) tablet, Take 1,000 Units by mouth daily.   FLUoxetine  (PROZAC ) 20 MG capsule, TAKE 1 CAPSULE DAILY   Multiple Vitamins-Minerals (MULTIVITAMIN GUMMIES ADULT PO), Take by mouth.   UNABLE TO FIND, Med Name: CBD Gummy   Reviewed prior external information including notes and imaging from  primary care provider As well as notes that were available from care everywhere and other healthcare systems.  Past medical history, social, surgical and family history all reviewed in electronic medical record.  No pertanent information unless stated regarding to the chief complaint.   Review of Systems:  No headache, visual changes, nausea, vomiting, diarrhea, constipation, dizziness, abdominal pain, skin rash, fevers, chills, night sweats, weight loss, swollen lymph nodes, body aches, joint swelling, chest pain, shortness of breath, mood changes. POSITIVE muscle aches  Objective  Blood pressure 124/72, pulse 73, height 5' 2.5 (1.588 m), weight 149 lb (67.6 kg), last menstrual period 09/10/2006, SpO2 98%.   General: No apparent distress alert and oriented x3 mood and affect normal, dressed appropriately.  HEENT: Pupils equal, extraocular movements intact  Respiratory: Patient's speak in full sentences and does not appear short of breath  Cardiovascular: No lower extremity edema, non tender, no erythema  Left foot exam shows patient does have swelling noted over the dorsal aspect of the foot.  Tender to palpation.  Does have bruising noted in between the 2nd and 3rd  toes that seems to be resolving.  Pain over the medial aspect of the foot minorly as well.   Limited muscular skeletal ultrasound was performed and interpreted by Thompson HUSSAR, M  Hematoma noted over the soft tissue area.  Very close to a very superficial vein.  Increased Doppler flow surrounding the soft tissue but no sign of any infectious etiology.  No cortical irregularity of any of the bones. Impression: Hematoma  Procedure: Real-time Ultrasound Guided Injection of left foot hematoma at midfoot. Device: GE Logiq Q7 Ultrasound guided injection is preferred based studies that show increased duration, increased effect, greater accuracy, decreased procedural pain, increased response rate, and decreased cost with ultrasound guided versus blind injection.  Verbal informed consent obtained.  Time-out conducted.  Noted no overlying erythema, induration, or other signs of local infection.  Skin prepped in a sterile fashion.  Local anesthesia: Topical Ethyl chloride.  With sterile technique and under real time ultrasound guidance: With an 18-gauge 1-1/2 inch needle and patient was injected with 0.5 cc 0.5% Marcaine and aspirated 5 cc of frank blood from the hematoma. Completed without difficulty  Pain immediately resolved suggesting accurate placement of the medication.  Advised to call if fevers/chills, erythema, induration, drainage, or persistent bleeding.  Images saved Impression: Technically successful ultrasound guided injection.    Impression and Recommendations:    The above documentation has been reviewed and is accurate and complete Kenzleigh Sedam M Ethen Bannan, DO

## 2024-05-08 NOTE — Progress Notes (Signed)
 Subjective:   [name] is a 73 y.o. who presents for a Medicare Wellness preventive visit.  As a reminder, Annual Wellness Visits don't include a physical exam, and some assessments may be limited, especially if this visit is performed virtually. We may recommend an in-person follow-up visit with your provider if needed.  Visit Complete: Virtual I connected with  Apolinar LITTIE Breen on 05/08/24 by a audio enabled telemedicine application and verified that I am speaking with the correct person using two identifiers.  Patient Location: Home  Provider Location: Home Office  I discussed the limitations of evaluation and management by telemedicine. The patient expressed understanding and agreed to proceed.  Vital Signs: Because this visit was a virtual/telehealth visit, some criteria may be missing or patient reported. Any vitals not documented were not able to be obtained and vitals that have been documented are patient reported.    Persons Participating in Visit: Patient.  AWV Questionnaire: Yes: Patient Medicare AWV questionnaire was completed by the patient on 05/05/24; I have confirmed that all information answered by patient is correct and no changes since this date.  Cardiac Risk Factors include: advanced age (>71men, >20 women);hypertension     Objective:    Today's Vitals   05/08/24 1437  Weight: 150 lb (68 kg)  Height: 5' 2.5 (1.588 m)   Body mass index is 27 kg/m.     05/08/2024    2:42 PM 03/22/2023    4:28 PM 03/10/2018    1:30 AM 03/07/2018    5:51 AM  Advanced Directives  Does Patient Have a Medical Advance Directive? No No No  No   Would patient like information on creating a medical advance directive? No - Patient declined No - Patient declined No - Patient declined  No - Patient declined      Data saved with a previous flowsheet row definition    Current Medications (verified) Outpatient Encounter Medications as of 05/08/2024  Medication Sig   albuterol   (VENTOLIN  HFA) 108 (90 Base) MCG/ACT inhaler Inhale 1-2 puffs into the lungs every 4 (four) hours as needed for wheezing or shortness of breath.   amLODipine  (NORVASC ) 5 MG tablet Take 1 tablet (5 mg total) by mouth daily.   Ascorbic Acid (VITAMIN C) 1000 MG tablet Take 1,000 mg by mouth daily.   aspirin  EC 81 MG tablet Take 1 tablet (81 mg total) by mouth daily.   atorvastatin  (LIPITOR ) 40 MG tablet Take 1 tablet (40 mg total) by mouth daily.   cetirizine (ZYRTEC) 10 MG tablet Take 10 mg by mouth daily as needed for allergies.    cholecalciferol (VITAMIN D3) 25 MCG (1000 UNIT) tablet Take 1,000 Units by mouth daily.   clopidogrel  (PLAVIX ) 75 MG tablet Take 1 tablet (75 mg total) by mouth daily.   FLUoxetine  (PROZAC ) 20 MG capsule TAKE 1 CAPSULE DAILY   fluticasone  (FLONASE ) 50 MCG/ACT nasal spray USE 2 SPRAYS IN EACH       NOSTRIL DAILY AS NEEDED   metoprolol  succinate (TOPROL -XL) 25 MG 24 hr tablet Take 0.5 tablets (12.5 mg total) by mouth daily.   montelukast  (SINGULAIR ) 10 MG tablet Take 1 tablet (10 mg total) by mouth at bedtime.   Multiple Vitamins-Minerals (MULTIVITAMIN GUMMIES ADULT PO) Take by mouth.   nitroGLYCERIN  (NITROSTAT ) 0.4 MG SL tablet Place 1 tablet (0.4 mg total) under the tongue every 5 (five) minutes as needed for chest pain.   UNABLE TO FIND Med Name: CBD Gummy   No facility-administered  encounter medications on file as of 05/08/2024.    Allergies (verified) Amitriptyline hcl, Citrus, Dust mite extract, Molds & smuts, Other, Sulfa antibiotics, Elemental sulfur , Grapefruit extract, and Sulfur    History: Past Medical History:  Diagnosis Date   Allergy    dust mite residue, mold, mildew, grapefruit, chili peppers, cat dander, some weeds (03/07/2018)   Anxiety    Arthritis    minor; right knee, left hip (03/07/2018)   Bruises easily    Cancer of left breast (HCC)    double mastectomy 01/26/2011   Childhood asthma    Contact lens/glasses fitting    Coronary  artery disease    a.  She underwent LHC 7/23 which revealed 95% stenosis of proximal LAD, managed with PCI/DES.    Depression    Essential hypertension    GERD (gastroesophageal reflux disease)    at times (03/07/2018)   Heart murmur    Hiatal hernia    High cholesterol    History of kidney stones    lots; no OR (03/07/2018)   Osteoporosis    Rectal bleeding    hemorroid   Ringing in ears, bilateral    since ~ 06/2017 (03/07/2018)   Sinus drainage    Past Surgical History:  Procedure Laterality Date   APPENDECTOMY     BREAST LUMPECTOMY W/ NEEDLE LOCALIZATION Left 10/05/2010   lumpectomy   BREAST SURGERY     CORONARY STENT INTERVENTION N/A 03/07/2018   Procedure: CORONARY STENT INTERVENTION;  Surgeon: Burnard Debby LABOR, MD;  Location: [facility];  Service: Cardiovascular;  Laterality: N/A;   LEFT HEART CATH AND CORONARY ANGIOGRAPHY N/A 03/07/2018   Procedure: LEFT HEART CATH AND CORONARY ANGIOGRAPHY;  Surgeon: Burnard Debby LABOR, MD;  Location: [facility];  Service: Cardiovascular;  Laterality: N/A;   MASTECTOMY Right 01/26/2011   MASTECTOMY COMPLETE / SIMPLE W/ SENTINEL NODE BIOPSY Left 01/26/2011   OVARIAN CYST SURGERY  1997, 2001, 2008   Family History  Problem Relation Age of Onset   Hypertension Mother    Heart Problems Mother    Hyperlipidemia Mother    Hypotension Father    Other Father        pacemaker   Heart failure Paternal Grandmother    Heart failure Paternal Grandfather    Cancer Brother    Cancer Sister    Diabetes Sister    Social History   Socioeconomic History   Marital status: Married    Spouse name: Not on file   Number of children: Not on file   Years of education: Not on file   Highest education level: Bachelor's degree (e.g., BA, AB, BS)  Occupational History   Not on file  Tobacco Use   Smoking status: Never   Smokeless tobacco: Never  Vaping Use   Vaping status: Never Used  Substance and Sexual Activity   Alcohol use:  Yes    Comment: 03/07/2018 probably once/month   Drug use: No   Sexual activity: Not on file  Other Topics Concern   Not on file  Social History Narrative   Lives at home alone   Right handed   Caffeine: 1/2 cup of coffee daily that has (1/2 & 1/2 caf)   Social Drivers of Health   Financial Resource Strain: Low Risk  (05/08/2024)   Overall Financial Resource Strain (CARDIA)    Difficulty of Paying Living Expenses: Not hard at all  Food Insecurity: No Food Insecurity (05/08/2024)   Hunger Vital Sign  Worried About Programme researcher, broadcasting/film/video in the Last Year: Never true    Ran Out of Food in the Last Year: Never true  Transportation Needs: No Transportation Needs (05/08/2024)   PRAPARE - Administrator, Civil Service (Medical): No    Lack of Transportation (Non-Medical): No  Physical Activity: Insufficiently Active (05/08/2024)   Exercise Vital Sign    Days of Exercise per Week: 1 day    Minutes of Exercise per Session: 30 min  Stress: No Stress Concern Present (05/08/2024)   [employer] of Occupational Health - Occupational Stress Questionnaire    Feeling of Stress: Only a little  Social Connections: Socially Integrated (05/08/2024)   Social Connection and Isolation Panel    Frequency of Communication with Friends and Family: More than three times a week    Frequency of Social Gatherings with Friends and Family: Twice a week    Attends Religious Services: More than 4 times per year    Active Member of [employer] or Organizations: Yes    Attends Engineer, structural: More than 4 times per year    Marital Status: Married    Tobacco Counseling Counseling given: Not Answered    Clinical Intake:  Pre-visit preparation completed: Yes  Pain : No/denies pain     BMI - recorded: 27 Nutritional Status: BMI 25 -29 Overweight Nutritional Risks: None Diabetes: No  Lab Results  Component Value Date   HGBA1C 6.0 03/09/2023   HGBA1C 5.8 (H) 11/24/2021   HGBA1C  5.8 (H) 12/04/2020     How often do you need to have someone help you when you read instructions, pamphlets, or other written materials from your doctor or pharmacy?: 1 - Never  Interpreter Needed?: No  Information entered by :: Rojelio Blush LPN   Activities of Daily Living     05/08/2024    2:40 PM 05/05/2024    1:40 PM  In your present state of health, do you have any difficulty performing the following activities:  Hearing? 0 0  Vision? 0 0  Difficulty concentrating or making decisions? 0 0  Walking or climbing stairs? 0 0  Dressing or bathing? 0 0  Doing errands, shopping? 0 0  Preparing Food and eating ? N N  Using the Toilet? N N  In the past six months, have you accidently leaked urine? Y Y  Comment Wear Pads. Followed by Urologist   Do you have problems with loss of bowel control? N N  Managing your Medications? N N  Managing your Finances? N N  Housekeeping or managing your Housekeeping? N N    Patient Care Team: Copland, Harlene BROCKS, MD as PCP - General (Family Medicine) Burnard Debby LABOR, MD (Inactive) as PCP - Cardiology (Cardiology) Leora Lenis, MD (Plastic Surgery) Keenan Hastings, MD (Radiation Oncology) Melodye Coy, MD (Inactive) (Hematology and Oncology) Copland, Harlene BROCKS, MD as Consulting Physician (Family Medicine)  I have updated your Care Teams any recent Medical Services you may have received from other providers in the past year.     Assessment:   This is a routine wellness examination for Heiley.  Hearing/Vision screen Hearing Screening - Comments:: Denies hearing difficulties   Vision Screening - Comments:: Wears rx glasses - up to date with routine eye exams with  Dr Octavia   Goals Addressed               This Visit's Progress     Increase physical activity (pt-stated)  Remain active.       Depression Screen     05/08/2024    2:43 PM 05/04/2024    1:56 PM 03/09/2023   11:25 AM 02/08/2022    1:20 PM 01/26/2021   10:48 AM  06/18/2020   11:28 AM 01/25/2020    9:22 AM  PHQ 2/9 Scores  PHQ - 2 Score 0 0 0 0 0 0 0  PHQ- 9 Score 0 3     0    Fall Risk     05/08/2024    2:41 PM 05/05/2024    1:40 PM 05/04/2024    1:56 PM 03/09/2023   11:24 AM 02/08/2022    1:20 PM  Fall Risk   Falls in the past year? 0 0 0 0 0  Number falls in past yr: 0 0 0 0 0  Injury with Fall? 0 0 0 0 0  Risk for fall due to : No Fall Risks  No Fall Risks No Fall Risks   Follow up Falls evaluation completed  Falls evaluation completed Falls evaluation completed     MEDICARE RISK AT HOME:  Medicare Risk at Home Any stairs in or around the home?: Yes If so, are there any without handrails?: No Home free of loose throw rugs in walkways, pet beds, electrical cords, etc?: Yes Adequate lighting in your home to reduce risk of falls?: Yes Life alert?: No Use of a cane, walker or w/c?: No Grab bars in the bathroom?: No Shower chair or bench in shower?: No Elevated toilet seat or a handicapped toilet?: No  TIMED UP AND GO:  Was the test performed?  No  Cognitive Function: 6CIT completed        05/08/2024    2:42 PM  6CIT Screen  What Year? 0 points  What month? 0 points  What time? 0 points  Count back from 20 0 points  Months in reverse 0 points  Repeat phrase 0 points  Total Score 0 points    Immunizations Immunization History  Administered Date(s) Administered   Influenza-Unspecified 05/19/2015, 06/07/2016, 06/07/2023   PFIZER(Purple Top)SARS-COV-2 Vaccination 08/18/2019, 09/15/2019   PNEUMOCOCCAL CONJUGATE-20 12/17/2020   Pneumococcal-Unspecified 01/27/2020   Tdap 04/03/2018   Zoster Recombinant(Shingrix) 06/07/2023    Screening Tests Health Maintenance  Topic Date Due   Zoster Vaccines- Shingrix (2 of 2) 08/02/2023   Influenza Vaccine  03/16/2024   COVID-19 Vaccine (3 - Pfizer risk series) 08/15/2024 (Originally 10/13/2019)   Medicare Annual Wellness (AWV)  05/08/2025   DTaP/Tdap/Td (2 - Td or Tdap) 04/03/2028    Colonoscopy  11/01/2033   Pneumococcal Vaccine: 50+ Years  Completed   DEXA SCAN  Completed   Hepatitis C Screening  Completed   HPV VACCINES  Aged Out   Meningococcal B Vaccine  Aged Out    Health Maintenance Items Addressed:   Additional Screening:  Vision Screening: Recommended annual ophthalmology exams for early detection of glaucoma and other disorders of the eye. Is the patient up to date with their annual eye exam?  Yes  Who is the provider or what is the name of the office in which the patient attends annual eye exams? Dr Octavia  Dental Screening: Recommended annual dental exams for proper oral hygiene  Community Resource Referral / Chronic Care Management: CRR required this visit?  No   CCM required this visit?  No   Plan:    I have personally reviewed and noted the following in the patient's chart:   Medical and  social history Use of alcohol, tobacco or illicit drugs  Current medications and supplements including opioid prescriptions. Patient is not currently taking opioid prescriptions. Functional ability and status Nutritional status Physical activity Advanced directives List of other physicians Hospitalizations, surgeries, and ER visits in previous 12 months Vitals Screenings to include cognitive, depression, and falls Referrals and appointments  In addition, I have reviewed and discussed with patient certain preventive protocols, quality metrics, and best practice recommendations. A written personalized care plan for preventive services as well as general preventive health recommendations were provided to patient.   Rojelio LELON Blush, LPN   [id number]   After Visit Summary: (MyChart) Due to this being a telephonic visit, the after visit summary with patients personalized plan was offered to patient via MyChart   Notes: Nothing significant to report at this time.

## 2024-05-09 ENCOUNTER — Other Ambulatory Visit: Payer: Self-pay

## 2024-05-09 ENCOUNTER — Encounter: Payer: Self-pay | Admitting: Family Medicine

## 2024-05-09 ENCOUNTER — Telehealth: Payer: Self-pay | Admitting: Family Medicine

## 2024-05-09 ENCOUNTER — Ambulatory Visit (INDEPENDENT_AMBULATORY_CARE_PROVIDER_SITE_OTHER): Admitting: Family Medicine

## 2024-05-09 VITALS — BP 124/72 | HR 73 | Ht 62.5 in | Wt 149.0 lb

## 2024-05-09 DIAGNOSIS — S9032XA Contusion of left foot, initial encounter: Secondary | ICD-10-CM

## 2024-05-09 DIAGNOSIS — I25118 Atherosclerotic heart disease of native coronary artery with other forms of angina pectoris: Secondary | ICD-10-CM

## 2024-05-09 DIAGNOSIS — M79672 Pain in left foot: Secondary | ICD-10-CM

## 2024-05-09 MED ORDER — NITROGLYCERIN 0.4 MG SL SUBL: 0.4000 mg | SUBLINGUAL_TABLET | SUBLINGUAL | 2 refills | Status: AC | PRN

## 2024-05-09 NOTE — Assessment & Plan Note (Addendum)
 Patient had hematoma noted.  Close to the midfoot.  Discussed icing regimen and home exercises, discussed with patient about which activities to do and which ones to avoid.  Did not put a compression on her today.  My hope is that patient does well with this.  Patient was concern for a blood clot but on Plavix  I think it is highly unlikely to almost impossible.  Patient did would need to get a D-dimer which we will order today to further evaluate.  Patient knows worsening symptoms, redness or streaking to seek medical attention immediately.  Will follow-up with me again in 6 to 8 weeks otherwise

## 2024-05-09 NOTE — Telephone Encounter (Signed)
 Patient called and asked if she should sleep in the compression wrap tonight. Please advise.

## 2024-05-09 NOTE — Patient Instructions (Signed)
 Drained hematoma Keep the compression on today Bruising may get worse Arnica gel Send a message next week to let us  know how you doing See you again in 8 weeks

## 2024-05-10 ENCOUNTER — Ambulatory Visit: Payer: Self-pay | Admitting: Family Medicine

## 2024-05-10 LAB — D-DIMER, QUANTITATIVE: D-Dimer, Quant: 0.29 ug{FEU}/mL (ref <0.50)

## 2024-05-11 ENCOUNTER — Ambulatory Visit: Payer: Self-pay | Admitting: Student

## 2024-05-23 ENCOUNTER — Encounter (INDEPENDENT_AMBULATORY_CARE_PROVIDER_SITE_OTHER): Payer: Self-pay | Admitting: Family Medicine

## 2024-05-23 DIAGNOSIS — Z131 Encounter for screening for diabetes mellitus: Secondary | ICD-10-CM | POA: Diagnosis not present

## 2024-05-24 ENCOUNTER — Encounter: Payer: Self-pay | Admitting: Family Medicine

## 2024-05-24 NOTE — Telephone Encounter (Signed)

## 2024-05-24 NOTE — Addendum Note (Signed)
 Addended by: WATT RAISIN C on: 05/24/2024 12:05 PM   Modules accepted: Orders

## 2024-06-05 DIAGNOSIS — M9903 Segmental and somatic dysfunction of lumbar region: Secondary | ICD-10-CM | POA: Diagnosis not present

## 2024-06-05 DIAGNOSIS — M9904 Segmental and somatic dysfunction of sacral region: Secondary | ICD-10-CM | POA: Diagnosis not present

## 2024-06-05 DIAGNOSIS — M9905 Segmental and somatic dysfunction of pelvic region: Secondary | ICD-10-CM | POA: Diagnosis not present

## 2024-06-07 DIAGNOSIS — M9905 Segmental and somatic dysfunction of pelvic region: Secondary | ICD-10-CM | POA: Diagnosis not present

## 2024-06-07 DIAGNOSIS — M9903 Segmental and somatic dysfunction of lumbar region: Secondary | ICD-10-CM | POA: Diagnosis not present

## 2024-06-07 DIAGNOSIS — M9904 Segmental and somatic dysfunction of sacral region: Secondary | ICD-10-CM | POA: Diagnosis not present

## 2024-06-09 ENCOUNTER — Encounter: Payer: Self-pay | Admitting: Family Medicine

## 2024-06-10 MED ORDER — METHOCARBAMOL 500 MG PO TABS: 500.0000 mg | ORAL_TABLET | Freq: Three times a day (TID) | ORAL | 0 refills | Status: AC | PRN

## 2024-06-13 DIAGNOSIS — M9903 Segmental and somatic dysfunction of lumbar region: Secondary | ICD-10-CM | POA: Diagnosis not present

## 2024-06-13 DIAGNOSIS — M9904 Segmental and somatic dysfunction of sacral region: Secondary | ICD-10-CM | POA: Diagnosis not present

## 2024-06-13 DIAGNOSIS — M9905 Segmental and somatic dysfunction of pelvic region: Secondary | ICD-10-CM | POA: Diagnosis not present

## 2024-06-19 DIAGNOSIS — H43393 Other vitreous opacities, bilateral: Secondary | ICD-10-CM | POA: Diagnosis not present

## 2024-06-19 DIAGNOSIS — H2513 Age-related nuclear cataract, bilateral: Secondary | ICD-10-CM | POA: Diagnosis not present

## 2024-06-19 DIAGNOSIS — H1045 Other chronic allergic conjunctivitis: Secondary | ICD-10-CM | POA: Diagnosis not present

## 2024-06-19 DIAGNOSIS — H04123 Dry eye syndrome of bilateral lacrimal glands: Secondary | ICD-10-CM | POA: Diagnosis not present

## 2024-07-10 ENCOUNTER — Ambulatory Visit: Admitting: Family Medicine

## 2024-07-26 ENCOUNTER — Other Ambulatory Visit: Payer: Self-pay | Admitting: Student

## 2024-07-26 DIAGNOSIS — R002 Palpitations: Secondary | ICD-10-CM

## 2024-07-26 DIAGNOSIS — I25118 Atherosclerotic heart disease of native coronary artery with other forms of angina pectoris: Secondary | ICD-10-CM

## 2025-05-16 ENCOUNTER — Ambulatory Visit
